# Patient Record
Sex: Female | Born: 1960
Health system: Southern US, Community
[De-identification: ages and names within clinical notes are randomized; demographics above are authoritative.]

## PROBLEM LIST (undated history)

## (undated) DIAGNOSIS — I10 Essential (primary) hypertension: Secondary | ICD-10-CM

## (undated) DIAGNOSIS — D509 Iron deficiency anemia, unspecified: Secondary | ICD-10-CM

## (undated) DIAGNOSIS — F411 Generalized anxiety disorder: Secondary | ICD-10-CM

## (undated) DIAGNOSIS — D72829 Elevated white blood cell count, unspecified: Secondary | ICD-10-CM

## (undated) DIAGNOSIS — I82409 Acute embolism and thrombosis of unspecified deep veins of unspecified lower extremity: Secondary | ICD-10-CM

## (undated) DIAGNOSIS — D693 Immune thrombocytopenic purpura: Secondary | ICD-10-CM

## (undated) DIAGNOSIS — I2699 Other pulmonary embolism without acute cor pulmonale: Secondary | ICD-10-CM

## (undated) HISTORY — DX: Elevated white blood cell count, unspecified: D72.829

## (undated) HISTORY — DX: Generalized anxiety disorder: F41.1

## (undated) HISTORY — PX: CHOLECYSTECTOMY: SHX55

## (undated) HISTORY — DX: Iron deficiency anemia, unspecified: D50.9

## (undated) HISTORY — DX: Acute embolism and thrombosis of unspecified deep veins of unspecified lower extremity: I82.409

## (undated) HISTORY — DX: Essential (primary) hypertension: I10

## (undated) HISTORY — PX: OTHER SURGICAL HISTORY: SHX169

## (undated) HISTORY — DX: Immune thrombocytopenic purpura: D69.3

---

## 2001-05-24 ENCOUNTER — Encounter: Payer: Self-pay | Admitting: Hematology & Oncology

## 2001-05-24 ENCOUNTER — Ambulatory Visit (HOSPITAL_COMMUNITY): Admission: RE | Admit: 2001-05-24 | Discharge: 2001-05-24 | Payer: Self-pay | Admitting: Hematology & Oncology

## 2001-05-28 ENCOUNTER — Ambulatory Visit (HOSPITAL_COMMUNITY): Admission: RE | Admit: 2001-05-28 | Discharge: 2001-05-28 | Payer: Self-pay | Admitting: Hematology & Oncology

## 2001-05-28 ENCOUNTER — Encounter: Payer: Self-pay | Admitting: Hematology & Oncology

## 2007-09-27 ENCOUNTER — Encounter: Payer: Self-pay | Admitting: Internal Medicine

## 2007-09-28 ENCOUNTER — Encounter: Payer: Self-pay | Admitting: Internal Medicine

## 2007-09-28 LAB — CONVERTED CEMR LAB
Albumin: 4.2 g/dL
Alkaline Phosphatase: 79 units/L
BUN: 10 mg/dL
CO2: 26 meq/L
Chloride: 101 meq/L
Cholesterol: 178 mg/dL
Creatinine, Ser: 0.8 mg/dL
HCT: 38.2 %
HDL: 49 mg/dL
MCV: 95.4 fL
Platelets: 135 10*3/uL
RDW: 12.6 %
Sodium: 139 meq/L
Total Protein: 7.2 g/dL
Triglyceride fasting, serum: 150 mg/dL

## 2007-11-12 ENCOUNTER — Encounter: Payer: Self-pay | Admitting: Internal Medicine

## 2007-11-12 LAB — CONVERTED CEMR LAB
MCV: 93.2 fL
Neutrophils Relative %: 61.9 %
Platelets: 149 10*3/uL
RBC: 4.16 M/uL
RDW: 12.4 %

## 2010-05-10 ENCOUNTER — Ambulatory Visit: Payer: Self-pay | Admitting: Internal Medicine

## 2010-05-10 DIAGNOSIS — I10 Essential (primary) hypertension: Secondary | ICD-10-CM | POA: Insufficient documentation

## 2010-05-10 DIAGNOSIS — F411 Generalized anxiety disorder: Secondary | ICD-10-CM | POA: Insufficient documentation

## 2010-05-10 DIAGNOSIS — D693 Immune thrombocytopenic purpura: Secondary | ICD-10-CM | POA: Insufficient documentation

## 2010-05-10 LAB — CONVERTED CEMR LAB
BUN: 9 mg/dL (ref 6–23)
Basophils Relative: 0.6 % (ref 0.0–3.0)
CO2: 26 meq/L (ref 19–32)
Chloride: 105 meq/L (ref 96–112)
Eosinophils Absolute: 0.4 10*3/uL (ref 0.0–0.7)
Eosinophils Relative: 3 % (ref 0.0–5.0)
Glucose, Bld: 105 mg/dL — ABNORMAL HIGH (ref 70–99)
Hemoglobin: 13.2 g/dL (ref 12.0–15.0)
Lymphocytes Relative: 18.9 % (ref 12.0–46.0)
MCHC: 32.7 g/dL (ref 30.0–36.0)
Monocytes Relative: 6.7 % (ref 3.0–12.0)
Neutro Abs: 9.4 10*3/uL — ABNORMAL HIGH (ref 1.4–7.7)
Potassium: 3.7 meq/L (ref 3.5–5.1)
RBC: 4.25 M/uL (ref 3.87–5.11)

## 2010-05-21 ENCOUNTER — Encounter: Payer: Self-pay | Admitting: Internal Medicine

## 2010-05-30 ENCOUNTER — Ambulatory Visit: Payer: Self-pay | Admitting: Internal Medicine

## 2010-06-17 ENCOUNTER — Inpatient Hospital Stay (HOSPITAL_COMMUNITY): Admission: AD | Admit: 2010-06-17 | Discharge: 2010-06-18 | Payer: Self-pay | Admitting: Internal Medicine

## 2010-06-17 ENCOUNTER — Encounter (INDEPENDENT_AMBULATORY_CARE_PROVIDER_SITE_OTHER): Payer: Self-pay | Admitting: *Deleted

## 2010-06-17 ENCOUNTER — Ambulatory Visit: Payer: Self-pay | Admitting: Internal Medicine

## 2010-06-17 ENCOUNTER — Telehealth: Payer: Self-pay | Admitting: Internal Medicine

## 2010-06-17 DIAGNOSIS — R5381 Other malaise: Secondary | ICD-10-CM | POA: Insufficient documentation

## 2010-06-17 DIAGNOSIS — R5383 Other fatigue: Secondary | ICD-10-CM

## 2010-06-17 DIAGNOSIS — D72829 Elevated white blood cell count, unspecified: Secondary | ICD-10-CM | POA: Insufficient documentation

## 2010-06-17 LAB — CONVERTED CEMR LAB
ALT: 18 units/L
AST: 41 units/L
Albumin: 3.5 g/dL
Alkaline Phosphatase: 71 units/L
BUN: 6 mg/dL
Basophils Absolute: 0.1 10*3/uL (ref 0.0–0.1)
CO2: 25 meq/L
Calcium: 8.9 mg/dL
Chloride: 103 meq/L
Creatinine, Ser: 0.89 mg/dL
Eosinophils Absolute: 0.4 10*3/uL (ref 0.0–0.7)
Glucose, Bld: 125 mg/dL
Hemoglobin: 5.7 g/dL — CL (ref 12.0–15.0)
Lymphocytes Relative: 14.8 % (ref 12.0–46.0)
Monocytes Relative: 5.9 % (ref 3.0–12.0)
Neutro Abs: 15.7 10*3/uL — ABNORMAL HIGH (ref 1.4–7.7)
Neutrophils Relative %: 76.8 % (ref 43.0–77.0)
Potassium: 3.7 meq/L
RDW: 15.1 % — ABNORMAL HIGH (ref 11.5–14.6)
Sodium: 137 meq/L
TSH: 1.077 microintl units/mL
Total Bilirubin: 0.5 mg/dL
Total Protein: 6.6 g/dL

## 2010-06-18 LAB — CONVERTED CEMR LAB
HCT: 23.2 %
Hemoglobin: 7.9 g/dL
RBC: 2.57 M/uL
RDW: 16 %
WBC: 19.2 10*3/uL

## 2010-06-21 ENCOUNTER — Ambulatory Visit: Payer: Self-pay | Admitting: Hematology & Oncology

## 2010-06-26 ENCOUNTER — Ambulatory Visit: Payer: Self-pay | Admitting: Internal Medicine

## 2010-06-26 DIAGNOSIS — D509 Iron deficiency anemia, unspecified: Secondary | ICD-10-CM | POA: Insufficient documentation

## 2010-06-26 DIAGNOSIS — N920 Excessive and frequent menstruation with regular cycle: Secondary | ICD-10-CM | POA: Insufficient documentation

## 2010-06-26 LAB — CONVERTED CEMR LAB
Basophils Absolute: 0.2 10*3/uL — ABNORMAL HIGH (ref 0.0–0.1)
Eosinophils Relative: 4 % (ref 0–5)
HCT: 29.3 % — ABNORMAL LOW (ref 36.0–46.0)
Hemoglobin: 9.7 g/dL — ABNORMAL LOW (ref 12.0–15.0)
Lymphocytes Relative: 14 % (ref 12–46)
Lymphs Abs: 3.1 10*3/uL (ref 0.7–4.0)
Monocytes Absolute: 1.6 10*3/uL — ABNORMAL HIGH (ref 0.1–1.0)
Neutro Abs: 15.4 10*3/uL — ABNORMAL HIGH (ref 1.7–7.7)
RBC: 3.22 M/uL — ABNORMAL LOW (ref 3.87–5.11)
RDW: 17.9 % — ABNORMAL HIGH (ref 11.5–15.5)
WBC: 21.2 10*3/uL — ABNORMAL HIGH (ref 4.0–10.5)

## 2010-06-28 ENCOUNTER — Ambulatory Visit: Payer: Self-pay | Admitting: Internal Medicine

## 2010-06-28 LAB — MANUAL DIFFERENTIAL (CHCC SATELLITE)
ANC (CHCC HP manual diff): 11.5 10*3/uL — ABNORMAL HIGH (ref 1.5–6.7)
LYMPH: 22 % (ref 14–48)
PLT EST ~~LOC~~: DECREASED
Platelet Morphology: NORMAL
SEG: 71 % (ref 40–75)

## 2010-06-28 LAB — CBC WITH DIFFERENTIAL (CANCER CENTER ONLY)
HGB: 10 g/dL — ABNORMAL LOW (ref 11.6–15.9)
MCH: 30.4 pg (ref 26.0–34.0)
RBC: 3.27 10*6/uL — ABNORMAL LOW (ref 3.70–5.32)

## 2010-06-28 LAB — CHCC SATELLITE - SMEAR

## 2010-07-02 ENCOUNTER — Ambulatory Visit: Payer: Self-pay | Admitting: Internal Medicine

## 2010-07-02 LAB — PROTEIN ELECTROPHORESIS, SERUM
Albumin ELP: 56.3 % (ref 55.8–66.1)
Alpha-1-Globulin: 8.7 % — ABNORMAL HIGH (ref 2.9–4.9)

## 2010-07-02 LAB — ERYTHROPOIETIN: Erythropoietin: 35.4 m[IU]/mL — ABNORMAL HIGH (ref 2.6–34.0)

## 2010-07-02 LAB — IRON AND TIBC

## 2010-07-02 LAB — HAPTOGLOBIN: Haptoglobin: 38 mg/dL (ref 16–200)

## 2010-07-02 LAB — RETICULOCYTES (CHCC): Retic Ct Pct: 6.8 % — ABNORMAL HIGH (ref 0.4–3.1)

## 2010-07-02 LAB — DIRECT ANTIGLOBULIN TEST (NOT AT ARMC): DAT IgG: NEGATIVE

## 2010-07-03 ENCOUNTER — Ambulatory Visit: Payer: Self-pay | Admitting: Hematology & Oncology

## 2010-07-05 LAB — CONVERTED CEMR LAB
Basophils Relative: 0.2 %
Eosinophils Relative: 3.1 %
HCT: 36.8 %
Hemoglobin: 11.8 g/dL
Lymphocytes, automated: 23.3 %
Monocytes Relative: 5.5 %
WBC: 21.1 10*3/uL

## 2010-07-05 LAB — TECHNOLOGIST REVIEW

## 2010-07-05 LAB — CBC WITH DIFFERENTIAL/PLATELET
BASO%: 0.2 % (ref 0.0–2.0)
EOS%: 3.1 % (ref 0.0–7.0)
LYMPH%: 23.3 % (ref 14.0–49.7)
MCH: 31.1 pg (ref 25.1–34.0)
MCHC: 32.1 g/dL (ref 31.5–36.0)
MONO#: 1.2 10*3/uL — ABNORMAL HIGH (ref 0.1–0.9)
Platelets: 14 10*3/uL — ABNORMAL LOW (ref 145–400)
RBC: 3.8 10*6/uL (ref 3.70–5.45)
WBC: 21.1 10*3/uL — ABNORMAL HIGH (ref 3.9–10.3)

## 2010-07-09 ENCOUNTER — Ambulatory Visit (HOSPITAL_COMMUNITY): Admission: RE | Admit: 2010-07-09 | Discharge: 2010-07-09 | Payer: Self-pay | Admitting: Hematology & Oncology

## 2010-07-12 LAB — CBC WITH DIFFERENTIAL/PLATELET
Basophils Absolute: 0.1 10*3/uL (ref 0.0–0.1)
Eosinophils Absolute: 0.1 10*3/uL (ref 0.0–0.5)
HGB: 12.1 g/dL (ref 11.6–15.9)
MCV: 97.4 fL (ref 79.5–101.0)
MONO%: 9.7 % (ref 0.0–14.0)
NEUT#: 19.5 10*3/uL — ABNORMAL HIGH (ref 1.5–6.5)
RDW: 18.9 % — ABNORMAL HIGH (ref 11.2–14.5)

## 2010-07-12 LAB — CONVERTED CEMR LAB
Eosinophils Relative: 0.3 %
HCT: 37.5 %
Hemoglobin: 12.1 g/dL
Monocytes Relative: 9.7 %
RBC: 3.85 M/uL

## 2010-07-19 ENCOUNTER — Ambulatory Visit (HOSPITAL_BASED_OUTPATIENT_CLINIC_OR_DEPARTMENT_OTHER): Admission: RE | Admit: 2010-07-19 | Discharge: 2010-07-19 | Payer: Self-pay | Admitting: Hematology & Oncology

## 2010-07-19 ENCOUNTER — Encounter: Payer: Self-pay | Admitting: Internal Medicine

## 2010-07-19 ENCOUNTER — Ambulatory Visit: Payer: Self-pay | Admitting: Diagnostic Radiology

## 2010-07-19 LAB — CBC WITH DIFFERENTIAL/PLATELET
Basophils Absolute: 0.1 10*3/uL (ref 0.0–0.1)
Eosinophils Absolute: 0 10*3/uL (ref 0.0–0.5)
HCT: 38.9 % (ref 34.8–46.6)
LYMPH%: 16.5 % (ref 14.0–49.7)
MCHC: 32.4 g/dL (ref 31.5–36.0)
MONO#: 1.6 10*3/uL — ABNORMAL HIGH (ref 0.1–0.9)
NEUT#: 13.2 10*3/uL — ABNORMAL HIGH (ref 1.5–6.5)
NEUT%: 74.1 % (ref 38.4–76.8)
Platelets: 277 10*3/uL (ref 145–400)
WBC: 17.8 10*3/uL — ABNORMAL HIGH (ref 3.9–10.3)

## 2010-07-19 LAB — CONVERTED CEMR LAB
Basophils Relative: 0.6 %
Eosinophils Relative: 0.1 %
HCT: 38.9 %
Neutrophils Relative %: 74.1 %
Platelets: 277 10*3/uL
RBC: 3.98 M/uL
RDW: 17.6 %

## 2010-07-22 ENCOUNTER — Telehealth: Payer: Self-pay | Admitting: Internal Medicine

## 2010-07-24 ENCOUNTER — Ambulatory Visit: Payer: Self-pay | Admitting: Internal Medicine

## 2010-07-24 ENCOUNTER — Ambulatory Visit: Payer: Self-pay | Admitting: Hematology & Oncology

## 2010-07-24 LAB — HYPERCOAGULABLE PANEL, COMPREHENSIVE
Anticardiolipin IgA: 3 APL U/mL (ref <22)
Anticardiolipin IgG: 3 GPL U/mL (ref <23)
Anticardiolipin IgM: 1 MPL U/mL (ref <11)
Beta-2 Glyco I IgG: 0 G Units (ref <20)
Beta-2-Glycoprotein I IgM: 3 M Units (ref <20)
PTT Lupus Anticoagulant: 40.8 secs (ref 30.0–45.6)

## 2010-07-26 ENCOUNTER — Encounter: Payer: Self-pay | Admitting: Internal Medicine

## 2010-07-26 LAB — CBC WITH DIFFERENTIAL (CANCER CENTER ONLY)
BASO#: 0.1 10*3/uL (ref 0.0–0.2)
BASO%: 1 % (ref 0.0–2.0)
EOS%: 3.8 % (ref 0.0–7.0)
Eosinophils Absolute: 0.4 10*3/uL (ref 0.0–0.5)
HCT: 40.4 % (ref 34.8–46.6)
HGB: 13.1 g/dL (ref 11.6–15.9)
LYMPH#: 3.2 10*3/uL (ref 0.9–3.3)
LYMPH%: 33.6 % (ref 14.0–48.0)
MCH: 31.8 pg (ref 26.0–34.0)
MCHC: 32.4 g/dL (ref 32.0–36.0)
MCV: 98 fL (ref 81–101)
MONO#: 0.9 10*3/uL (ref 0.1–0.9)
MONO%: 9 % (ref 0.0–13.0)
NEUT#: 5 10*3/uL (ref 1.5–6.5)
NEUT%: 52.6 % (ref 39.6–80.0)
Platelets: 302 10*3/uL (ref 145–400)
RBC: 4.11 10*6/uL (ref 3.70–5.32)
RDW: 14.8 % — ABNORMAL HIGH (ref 10.5–14.6)
WBC: 9.6 10*3/uL (ref 3.9–10.0)

## 2010-07-26 LAB — PROTIME-INR (CHCC SATELLITE)
INR: 2.4 (ref 2.0–3.5)
Protime: 28.8 Seconds — ABNORMAL HIGH (ref 10.6–13.4)

## 2010-07-26 LAB — FERRITIN: Ferritin: 364 ng/mL — ABNORMAL HIGH (ref 10–291)

## 2010-07-26 LAB — TECHNOLOGIST REVIEW CHCC SATELLITE

## 2010-08-02 ENCOUNTER — Ambulatory Visit: Payer: Self-pay | Admitting: Hematology & Oncology

## 2010-08-02 LAB — CBC WITH DIFFERENTIAL/PLATELET
Basophils Absolute: 0.1 10*3/uL (ref 0.0–0.1)
EOS%: 5.9 % (ref 0.0–7.0)
HCT: 38.9 % (ref 34.8–46.6)
HGB: 12.9 g/dL (ref 11.6–15.9)
MCH: 31.9 pg (ref 25.1–34.0)
NEUT%: 47.9 % (ref 38.4–76.8)
Platelets: 353 10*3/uL (ref 145–400)
lymph#: 3.8 10*3/uL — ABNORMAL HIGH (ref 0.9–3.3)

## 2010-08-02 LAB — PROTIME-INR

## 2010-08-05 ENCOUNTER — Ambulatory Visit: Payer: Self-pay | Admitting: Internal Medicine

## 2010-08-09 ENCOUNTER — Ambulatory Visit: Payer: Self-pay | Admitting: Hematology & Oncology

## 2010-08-09 ENCOUNTER — Inpatient Hospital Stay (HOSPITAL_COMMUNITY): Admission: AD | Admit: 2010-08-09 | Discharge: 2010-08-11 | Payer: Self-pay | Admitting: Hematology & Oncology

## 2010-08-09 LAB — CBC WITH DIFFERENTIAL/PLATELET
BASO%: 0.9 % (ref 0.0–2.0)
HCT: 37.4 % (ref 34.8–46.6)
HGB: 12.7 g/dL (ref 11.6–15.9)
LYMPH%: 24.5 % (ref 14.0–49.7)
MCH: 31.8 pg (ref 25.1–34.0)
MCV: 93.7 fL (ref 79.5–101.0)
RDW: 14.9 % — ABNORMAL HIGH (ref 11.2–14.5)
nRBC: 1 % — ABNORMAL HIGH (ref 0–0)

## 2010-08-09 LAB — PROTIME-INR
INR: 2.5 (ref 2.00–3.50)
Protime: 30 Seconds — ABNORMAL HIGH (ref 10.6–13.4)

## 2010-08-09 LAB — TECHNOLOGIST REVIEW

## 2010-08-10 ENCOUNTER — Ambulatory Visit: Payer: Self-pay | Admitting: Internal Medicine

## 2010-08-14 LAB — CBC WITH DIFFERENTIAL/PLATELET
BASO%: 0.2 % (ref 0.0–2.0)
Basophils Absolute: 0.1 10*3/uL (ref 0.0–0.1)
EOS%: 0 % (ref 0.0–7.0)
HCT: 33 % — ABNORMAL LOW (ref 34.8–46.6)
HGB: 11.4 g/dL — ABNORMAL LOW (ref 11.6–15.9)
LYMPH%: 9 % — ABNORMAL LOW (ref 14.0–49.7)
MCH: 32.1 pg (ref 25.1–34.0)
MCHC: 34.5 g/dL (ref 31.5–36.0)
MCV: 93 fL (ref 79.5–101.0)
NEUT%: 83.4 % — ABNORMAL HIGH (ref 38.4–76.8)
Platelets: 327 10*3/uL (ref 145–400)
lymph#: 2.4 10*3/uL (ref 0.9–3.3)

## 2010-08-14 LAB — PROTIME-INR

## 2010-08-19 ENCOUNTER — Encounter: Payer: Self-pay | Admitting: Internal Medicine

## 2010-08-19 LAB — CBC WITH DIFFERENTIAL (CANCER CENTER ONLY)
BASO%: 1.1 % (ref 0.0–2.0)
Eosinophils Absolute: 0.3 10*3/uL (ref 0.0–0.5)
HCT: 41.3 % (ref 34.8–46.6)
LYMPH%: 10.3 % — ABNORMAL LOW (ref 14.0–48.0)
MCH: 32 pg (ref 26.0–34.0)
MCV: 97 fL (ref 81–101)
MONO#: 1.5 10*3/uL — ABNORMAL HIGH (ref 0.1–0.9)
MONO%: 6.7 % (ref 0.0–13.0)
NEUT%: 80.5 % — ABNORMAL HIGH (ref 39.6–80.0)
Platelets: 676 10*3/uL — ABNORMAL HIGH (ref 145–400)
RDW: 13.2 % (ref 10.5–14.6)
WBC: 23.1 10*3/uL — ABNORMAL HIGH (ref 3.9–10.0)

## 2010-08-19 LAB — TECHNOLOGIST REVIEW CHCC SATELLITE

## 2010-08-19 LAB — PROTIME-INR (CHCC SATELLITE)

## 2010-08-23 ENCOUNTER — Ambulatory Visit: Payer: Self-pay | Admitting: Hematology & Oncology

## 2010-08-28 ENCOUNTER — Encounter: Payer: Self-pay | Admitting: Internal Medicine

## 2010-08-28 LAB — TECHNOLOGIST REVIEW CHCC SATELLITE: Tech Review: INCREASED

## 2010-08-28 LAB — CBC WITH DIFFERENTIAL (CANCER CENTER ONLY)
BASO%: 0.8 % (ref 0.0–2.0)
EOS%: 1.5 % (ref 0.0–7.0)
Eosinophils Absolute: 0.2 10*3/uL (ref 0.0–0.5)
MCH: 32.4 pg (ref 26.0–34.0)
MONO%: 5.5 % (ref 0.0–13.0)
NEUT#: 13.1 10*3/uL — ABNORMAL HIGH (ref 1.5–6.5)
Platelets: 473 10*3/uL — ABNORMAL HIGH (ref 145–400)
RBC: 3.76 10*6/uL (ref 3.70–5.32)
RDW: 12.9 % (ref 10.5–14.6)

## 2010-08-28 LAB — PROTIME-INR (CHCC SATELLITE)
INR: 1.7 — ABNORMAL LOW (ref 2.0–3.5)
Protime: 20.4 s — ABNORMAL HIGH (ref 10.6–13.4)

## 2010-09-02 LAB — CBC WITH DIFFERENTIAL/PLATELET
BASO%: 0.1 % (ref 0.0–2.0)
Basophils Absolute: 0 10*3/uL (ref 0.0–0.1)
EOS%: 2.7 % (ref 0.0–7.0)
Eosinophils Absolute: 0.4 10*3/uL (ref 0.0–0.5)
HCT: 37.2 % (ref 34.8–46.6)
HGB: 12.6 g/dL (ref 11.6–15.9)
LYMPH%: 33.9 % (ref 14.0–49.7)
MCH: 32.1 pg (ref 25.1–34.0)
MCHC: 33.9 g/dL (ref 31.5–36.0)
MCV: 94.7 fL (ref 79.5–101.0)
MONO#: 0.7 10*3/uL (ref 0.1–0.9)
MONO%: 4.5 % (ref 0.0–14.0)
NEUT#: 9.4 10*3/uL — ABNORMAL HIGH (ref 1.5–6.5)
NEUT%: 58.8 % (ref 38.4–76.8)
Platelets: 293 10*3/uL (ref 145–400)
RBC: 3.93 10*6/uL (ref 3.70–5.45)
RDW: 16 % — ABNORMAL HIGH (ref 11.2–14.5)
WBC: 15.9 10*3/uL — ABNORMAL HIGH (ref 3.9–10.3)
lymph#: 5.4 10*3/uL — ABNORMAL HIGH (ref 0.9–3.3)
nRBC: 0 % (ref 0–0)

## 2010-09-04 ENCOUNTER — Ambulatory Visit: Payer: Self-pay | Admitting: Hematology & Oncology

## 2010-09-06 LAB — CBC WITH DIFFERENTIAL/PLATELET
Eosinophils Absolute: 0.4 10*3/uL (ref 0.0–0.5)
LYMPH%: 28.6 % (ref 14.0–49.7)
MONO#: 1.4 10*3/uL — ABNORMAL HIGH (ref 0.1–0.9)
NEUT#: 9.6 10*3/uL — ABNORMAL HIGH (ref 1.5–6.5)
Platelets: 312 10*3/uL (ref 145–400)
RBC: 3.88 10*6/uL (ref 3.70–5.45)
WBC: 15.8 10*3/uL — ABNORMAL HIGH (ref 3.9–10.3)
nRBC: 0 % (ref 0–0)

## 2010-09-06 LAB — PROTIME-INR: Protime: 18 Seconds — ABNORMAL HIGH (ref 10.6–13.4)

## 2010-09-16 LAB — CBC WITH DIFFERENTIAL/PLATELET
Basophils Absolute: 0 10*3/uL (ref 0.0–0.1)
Eosinophils Absolute: 0 10*3/uL (ref 0.0–0.5)
HCT: 36.9 % (ref 34.8–46.6)
HGB: 12.5 g/dL (ref 11.6–15.9)
MONO#: 0.2 10*3/uL (ref 0.1–0.9)
NEUT%: 94.5 % — ABNORMAL HIGH (ref 38.4–76.8)
WBC: 18.7 10*3/uL — ABNORMAL HIGH (ref 3.9–10.3)
lymph#: 0.8 10*3/uL — ABNORMAL LOW (ref 0.9–3.3)

## 2010-09-16 LAB — PROTIME-INR: INR: 4.1 — ABNORMAL HIGH (ref 2.00–3.50)

## 2010-09-27 ENCOUNTER — Ambulatory Visit: Payer: Self-pay | Admitting: Hematology & Oncology

## 2010-09-30 ENCOUNTER — Encounter: Payer: Self-pay | Admitting: Internal Medicine

## 2010-09-30 LAB — CBC WITH DIFFERENTIAL (CANCER CENTER ONLY)
BASO#: 0.2 10*3/uL (ref 0.0–0.2)
EOS%: 1.5 % (ref 0.0–7.0)
HCT: 37.9 % (ref 34.8–46.6)
HGB: 12.6 g/dL (ref 11.6–15.9)
MCH: 32.1 pg (ref 26.0–34.0)
MCHC: 33.3 g/dL (ref 32.0–36.0)
MCV: 97 fL (ref 81–101)
MONO%: 7.4 % (ref 0.0–13.0)
NEUT%: 73.7 % (ref 39.6–80.0)

## 2010-09-30 LAB — COMPREHENSIVE METABOLIC PANEL
ALT: 15 U/L (ref 0–35)
Albumin: 4.1 g/dL (ref 3.5–5.2)
Alkaline Phosphatase: 51 U/L (ref 39–117)
CO2: 24 mEq/L (ref 19–32)
Glucose, Bld: 143 mg/dL — ABNORMAL HIGH (ref 70–99)
Potassium: 3.5 mEq/L (ref 3.5–5.3)
Sodium: 139 mEq/L (ref 135–145)
Total Protein: 6.5 g/dL (ref 6.0–8.3)

## 2010-09-30 LAB — PROTIME-INR (CHCC SATELLITE): INR: 0.9 — ABNORMAL LOW (ref 2.0–3.5)

## 2010-10-07 ENCOUNTER — Ambulatory Visit: Payer: Self-pay | Admitting: Internal Medicine

## 2010-10-09 ENCOUNTER — Encounter (INDEPENDENT_AMBULATORY_CARE_PROVIDER_SITE_OTHER): Payer: Self-pay | Admitting: *Deleted

## 2010-10-24 ENCOUNTER — Ambulatory Visit: Payer: Self-pay | Admitting: Hematology & Oncology

## 2010-10-28 LAB — CBC WITH DIFFERENTIAL/PLATELET
BASO%: 0.5 % (ref 0.0–2.0)
Basophils Absolute: 0.1 10*3/uL (ref 0.0–0.1)
EOS%: 6.3 % (ref 0.0–7.0)
Eosinophils Absolute: 0.9 10*3/uL — ABNORMAL HIGH (ref 0.0–0.5)
HCT: 36.5 % (ref 34.8–46.6)
HGB: 12.7 g/dL (ref 11.6–15.9)
LYMPH%: 24.5 % (ref 14.0–49.7)
MCH: 32 pg (ref 25.1–34.0)
MCHC: 34.8 g/dL (ref 31.5–36.0)
MCV: 91.9 fL (ref 79.5–101.0)
MONO#: 1 10*3/uL — ABNORMAL HIGH (ref 0.1–0.9)
MONO%: 6.7 % (ref 0.0–14.0)
NEUT#: 8.9 10*3/uL — ABNORMAL HIGH (ref 1.5–6.5)
NEUT%: 62 % (ref 38.4–76.8)
Platelets: 215 10*3/uL (ref 145–400)
RBC: 3.97 10*6/uL (ref 3.70–5.45)
RDW: 14.1 % (ref 11.2–14.5)
WBC: 14.3 10*3/uL — ABNORMAL HIGH (ref 3.9–10.3)
lymph#: 3.5 10*3/uL — ABNORMAL HIGH (ref 0.9–3.3)
nRBC: 0 % (ref 0–0)

## 2010-10-28 LAB — CHCC SMEAR

## 2010-11-07 ENCOUNTER — Ambulatory Visit: Admit: 2010-11-07 | Payer: Self-pay | Admitting: Internal Medicine

## 2010-11-19 NOTE — Letter (Signed)
Summary: Out of Work  [facility]  [address] [place], [place] [address]   Phone: [phone]  Fax: [phone]    June 17, 2010   Employee:  Janice Martin    To Whom It May Concern:   For Medical reasons, please excuse the above named employee from work for the following dates:  Start: 06/17/10    End: 06/18/10, May return back to work on Wednesday 06/19/10    If you need additional information, please feel free to contact our office.         Sincerely,    Dr. Rene Paci

## 2010-11-19 NOTE — Letter (Signed)
Summary: [facility]  [facility]   Imported By: Sherian Rein 09/09/2010 14:03:05  _____________________________________________________________________  External Attachment:    Type:   Image     Comment:   External Document

## 2010-11-19 NOTE — Letter (Signed)
Summary: [facility]  [facility]   Imported By: Sherian Rein 08/16/2010 09:22:19  _____________________________________________________________________  External Attachment:    Type:   Image     Comment:   External Document

## 2010-11-19 NOTE — Letter (Signed)
Summary: [facility]  [facility]   Imported By: Sherian Rein 08/09/2010 10:55:21  _____________________________________________________________________  External Attachment:    Type:   Image     Comment:   External Document

## 2010-11-19 NOTE — Letter (Signed)
Summary: Internal Correspondence  Internal Correspondence   Imported By: Sherian Rein 09/09/2010 12:36:32  _____________________________________________________________________  External Attachment:    Type:   Image     Comment:   External Document

## 2010-11-19 NOTE — Assessment & Plan Note (Signed)
Summary: 1-2 WK ROV /NWS  #   Vital Signs:  Patient profile:   50 year old female Height:      64 inches (162.56 cm) Weight:      193.2 pounds (87.82 kg) O2 Sat:      98 % on Room air Temp:     98.5 degrees F (36.94 degrees C) oral Pulse rate:   95 / minute BP sitting:   162 / 92  (left arm) Cuff size:   large  Vitals Entered By: Orlan Leavens RMA (June 26, 2010 8:48 AM)  O2 Flow:  Room air CC: 1-2 week follow-up Is Patient Diabetic? No Pain Assessment Patient in pain? no      Comments Pt d/c from hosp 06/18/10   Primary Care Provider:  Newt Lukes MD  CC:  1-2 week follow-up.  History of Present Illness: here for hosp f/u - acute iron defic anemia feels "200%" better since transfusion 2U PRBCs last week - no fatigue, no pain, no cramping, not weak - no HA or fever - no SOB or cough - no D/C no bleeding since last heavy menses - clotting and menorrhgia reviewed - plans to see gyn next week taking iron two times a day   reviewed other chronic issues anxiety - onset years ago (when lost job/house 2004) but progressively worse, esp last few months - denies recent stressors or change in life events - +hx same 20years ago  - use trial xanax for short period of time then - denies depression signs - no sadness or sleep problems - described as feeling "closed in" at work and "jumpy inside" - assoc with occ palpitations and tremor in hands -  no change in BMs, + weight gain (10lbs in past year) trial ciltalopram rx'd 04/2010 - not taking due to fear of suicide risk on warning labels also not needing BZ - "i'm ok"  HTN - hx same, prev on lisinopril with reported good control- no known kidney dz or cardiac problems - +mild swelling in feet chronic - no SOB or HA begun on combo HCTZ-ACEI mid 04/2010- ?cramping as SE so now off med combo- tol bystolic w/o adv effects - reports BP well controlled at home  ITP hx - no bleeding or bruising - no changes prev followed  with heme for same, last seen 2003  -  Date:  06/18/2010    WBC: 19.2    HGB: 7.9    HCT: 23.2    RBC: 2.57    PLT: 50    MCV: 90.2    RDW: 16.0  Date:  06/17/2010    BG Random: 125    BUN: 6    Creatinine: 0.89    Sodium: 137    Potassium: 3.7    Chloride: 103    CO2 Total: 25    SGOT (AST): 41    SGPT (ALT): 18    T. Bilirubin: 0.5    Alk Phos: 71    Calcium: 8.9    Total Protein: 6.6    Albumin: 3.5    TSH: 1.077  Current Medications (verified): 1)  Bystolic 5 Mg Tabs (Nebivolol Hcl) .Marland Kitchen.. 1 By Mouth Once Daily 2)  Alprazolam 0.25 Mg Tabs (Alprazolam) .Marland Kitchen.. 1 By Mouth Every 4 Hours As Needed For Anxiety Symptoms 3)  Iron 325 (65 Fe) Mg Tabs (Ferrous Sulfate) .... Take 1 Two Times A Day  Allergies (verified): 1)  ! Morphine  Past History:  Past Medical History:  Hypertension ITP - mild leukocytosis dating back 09/2007 cholelithiasis anxiety anemia - iron defic menorrhagia s/p 2U PRBC txfn 04/2010  MD roster: heme - ennever gyn- richard kaplan   Review of Systems  The patient denies fever, weight loss, chest pain, headaches, abdominal pain, melena, and hematochezia.    Physical Exam  General:  nontoxic. mildly overweight-appearing.  alert, well-developed, well-nourished, and cooperative to examination.    Lungs:  normal respiratory effort, no intercostal retractions or use of accessory muscles; normal breath sounds bilaterally - no crackles and no wheezes.    Heart:  normal rate, slightly tachy rhythm, no murmur, and no rub. BLE with no edema.  Neurologic:  alert & oriented X3 and cranial nerves II-XII symetrically intact.  strength normal in all extremities, sensation intact to light touch, and gait normal. speech fluent without dysarthria or aphasia; follows commands with good comprehension.  Skin:  mildly flushed face and chest  - otherwise, no rashes, vesicles, ulcers, or erythema. No nodules or irregularity to palpation.  Psych:  Oriented X3, memory  intact for recent and remote, normally interactive, good eye contact, mild anxious appearing, not depressed appearing, and not agitated.      Impression & Recommendations:  Problem # 1:  ANEMIA-IRON DEFICIENCY (ICD-280.9)  recent hosp for PRBC transfusion reviewed presumably due to menorrhgia - appt [facility] gyn this week to review - ?endometrial bx also concern for primary BM issue with chronic ITP and persistining leukocytosis - to see heme this week recheck CBC now to monitor - cont iron pills Her updated medication list for this problem includes:    Iron 325 (65 Fe) Mg Tabs (Ferrous sulfate) .Marland Kitchen... Take 1 two times a day  Hgb: 7.9 (06/18/2010)   Hct: 23.2 (06/18/2010)   Platelets: 50 (06/18/2010) RBC: 2.57 (06/18/2010)   RDW: 16.0 (06/18/2010)   WBC: 19.2 (06/18/2010) MCV: 90.2 (06/18/2010)   MCHC: 32.7 (06/17/2010) TSH: 1.077 (06/17/2010)  Problem # 2:  IMMUNE THROMBOCYTOPENIC PURPURA (ICD-287.31)  Orders: TLB-CBC Platelet - w/Differential (85025-CBCD) Oncology Referral (Oncology)  hx same 1987 - reports no sig issues since splenectomy '87 - check lab now - see baove - to see hemer this week (ennever)  Problem # 3:  LEUKOCYTOSIS UNSPECIFIED (ICD-288.60)  long hx ITP - mild persisitng leukocytosis noted -  recheck now, esp with fatigue and refer to heme for re-eval of same - ?BM bx needed see above issues  Problem # 4:  MENORRHAGIA (ICD-626.2) see #1 - anemia  Problem # 5:  HYPERTENSION (ICD-401.9)  Her updated medication list for this problem includes:    Bystolic 5 Mg Tabs (Nebivolol hcl) .Marland Kitchen... 1 by mouth once daily  not yet at goal  intol of lisinopril - intol of hctz component with dehydration and cramping symptoms - stop same cont bystolic trial - pt reports BP better (except today) will reasses at f/u in 4 weeks - will need FLP at future OV when fasting   Prior BP: 148/102 (05/30/2010) Prior BP: 152/112 (05/10/2010) Prior BP: 152/98 (06/17/2010)  Labs  Reviewed: K+: 3.7 (06/17/2010) Creat: : 0.89 (06/17/2010)   Chol: 178 (09/28/2007)   HDL: 49 (09/28/2007)   LDL: 117 (09/28/2007)   TG: 150 (09/28/2007)  Complete Medication List: 1)  Bystolic 5 Mg Tabs (Nebivolol hcl) .Marland Kitchen.. 1 by mouth once daily 2)  Alprazolam 0.25 Mg Tabs (Alprazolam) .Marland Kitchen.. 1 by mouth every 4 hours as needed for anxiety symptoms 3)  Iron 325 (65 Fe) Mg Tabs (Ferrous sulfate) .... Take 1 two times  a day  Patient Instructions: 1)  it was good to see you today. 2)  test(s) ordered today - your results will be posted on the phone tree for review in 48-72 hours from the time of test completion; call [phone] and enter your 9 digit MRN (listed above on this page, just below your name); if any changes need to be made or there are abnormal results, you will be contacted directly. 3)  continue iron pills 4)  keep followup with drs. ennever and kaplan as scheduled 5)  Please schedule a follow-up appointment in 4 weeks to recheck blood pressure, sooner if problems. cont bystolic until then Prescriptions: BYSTOLIC 5 MG TABS (NEBIVOLOL HCL) 1 by mouth once daily  #30 x 2   Entered by:   Orlan Leavens RMA   Authorized by:   Newt Lukes MD   Signed by:   Orlan Leavens RMA on 06/26/2010   Method used:   Electronically to        [facility] [id number]* (retail)       [address]       [place], [place]  [address]       Ph: [phone]       Fax: [phone]   RxID:   [id number]

## 2010-11-19 NOTE — Assessment & Plan Note (Signed)
Summary: LEGS CRAMP--NO ENERGY---STC   Vital Signs:  Patient profile:   50 year old female Height:      64 inches (162.56 cm) Weight:      197 pounds (89.55 kg) O2 Sat:      99 % Temp:     98.0 degrees F (36.67 degrees C) oral Pulse rate:   134 / minute BP sitting:   152 / 98  (left arm) Cuff size:   large  Vitals Entered By: Orlan Leavens RMA (June 17, 2010 11:22 AM) CC: Leg cramp & no energy Is Patient Diabetic? No Pain Assessment Patient in pain? yes     Location: legs Type: cramping   Primary Care Provider:  Newt Lukes MD  CC:  Leg cramp & no energy.  History of Present Illness: here for c/o leg cramping onset 6 days ago -  felt related to new bp med - lisinorpil-hctz - so stopped same feels dry mouth  also light headed when walking (improves with rest) no CP or SOB, ongoing panic attacks as before  reviewed chronic med issues: anxiety - onset years ago (when lost job/house 2004) but progressively worse, esp last few months - denies recent stressors or change in life events - +hx same 20years ago  - use trial xanax for short period of time then - denies depression signs - no sadness or sleep problems - described as feeling "closed in" at work and "jumpy inside" - assoc with occ palpitations and tremor in hands -  no change in BMs, + weight gain (10lbs in past year) trial ciltalopram rx'd 04/2010 - not taking due to fear of suicide risk on warning labels also not needing BZ - "i'm ok"  HTN - hx same, prev on lisinopril but not in few years - no known kidney dz or cardiac problems - +mild swelling in feet chronic - no SOB or HA begun on HCTZ prev - ?cramping as SE so now off med combo-  3) ITP hx - no bleeding or bruising - no changes prev followed with heme for same, last seen 2003  Clinical Review Panels:  CBC   WBC:  13.2 (05/10/2010)   RBC:  4.25 (05/10/2010)   Hgb:  13.2 (05/10/2010)   Hct:  40.4 (05/10/2010)   Platelets:  87.0  (05/10/2010)   MCV  95.0 (05/10/2010)   MCHC  32.7 (05/10/2010)   RDW  16.5 (05/10/2010)   PMN:  70.8 (05/10/2010)   Lymphs:  18.9 (05/10/2010)   Monos:  6.7 (05/10/2010)   Eosinophils:  3.0 (05/10/2010)   Basophil:  0.6 (05/10/2010)  Complete Metabolic Panel   Glucose:  105 (05/10/2010)   Sodium:  140 (05/10/2010)   Potassium:  3.7 (05/10/2010)   Chloride:  105 (05/10/2010)   CO2:  26 (05/10/2010)   BUN:  9 (05/10/2010)   Creatinine:  0.8 (05/10/2010)   Albumin:  4.2 (09/28/2007)   Total Protein:  7.2 (09/28/2007)   Calcium:  9.1 (05/10/2010)   Total Bili:  0.4 (09/28/2007)   Alk Phos:  79 (09/28/2007)   SGPT (ALT):  14 (09/28/2007)   SGOT (AST):  14 (09/28/2007)   Current Medications (verified): 1)  Citalopram Hydrobromide 10 Mg Tabs (Citalopram Hydrobromide) .Marland Kitchen.. 1 By Mouth Once Daily 2)  Lisinopril-Hydrochlorothiazide 20-25 Mg Tabs (Lisinopril-Hydrochlorothiazide) .Marland Kitchen.. 1 By Mouth Once Daily 3)  Alprazolam 0.25 Mg Tabs (Alprazolam) .Marland Kitchen.. 1 By Mouth Every 4 Hours As Needed For Anxiety Symptoms  Allergies (verified): 1)  ! Morphine  Past  History:  Past Medical History: Hypertension ITP - mild leukocytosis dating back 09/2007 cholelithiasis anxiety  MD roster: heme - ennever  Review of Systems  The patient denies hoarseness, chest pain, syncope, peripheral edema, prolonged cough, headaches, and abdominal pain.    Physical Exam  General:  overweight-appearing.  alert, well-developed, well-nourished, and cooperative to examination.    Mouth:  dry MM Lungs:  normal respiratory effort, no intercostal retractions or use of accessory muscles; normal breath sounds bilaterally - no crackles and no wheezes.    Heart:  normal rate, slightly tachy rhythm, no murmur, and no rub. BLE with no edema.  Psych:  Oriented X3, memory intact for recent and remote, normally interactive, good eye contact, mild anxious appearing, not depressed appearing, and not agitated.       Impression & Recommendations:  Problem # 1:  FATIGUE (ICD-780.79) ? side effect BP med - now off lisinopril-hctz - reviewed recent labs 7/22 - tsh ok, mild linc wbc - chronic on [facility] (at least 09/2007) recehck now and ask heme to re-eval same  esp in setting of ITP - see next still untreated anxiety component - may need to revisit after other acute med issues resolved -  Orders: TLB-CBC Platelet - w/Differential (85025-CBCD) Oncology Referral (Oncology)  Problem # 2:  HYPERTENSION (ICD-401.9)  Her updated medication list for this problem includes:    Bystolic 5 Mg Tabs (Nebivolol hcl) .Marland Kitchen... 1 by mouth once daily  improved but not yet at goal on lisinopril - intol of hctz component with dehydration and cramping symptoms - stop same start bystolic trial will reasses at f/u in 1-2 weeks - will need FLP at future OV when fasting  BP today: 152/98 Prior BP: 148/102 (05/30/2010) Prior BP: 152/112 (05/10/2010)  Labs Reviewed: K+: 3.7 (05/10/2010) Creat: : 0.8 (05/10/2010)   Chol: 178 (09/28/2007)   HDL: 49 (09/28/2007)   LDL: 117 (09/28/2007)   TG: 150 (09/28/2007)  Problem # 3:  LEUKOCYTOSIS UNSPECIFIED (ICD-288.60) long hx ITP - mild persisitng leukocytosis noted -  recheck now, esp with fatigue and refer to heme for re-eval of same - ?BM bx needed Orders: TLB-CBC Platelet - w/Differential (85025-CBCD) Oncology Referral (Oncology)  Problem # 4:  IMMUNE THROMBOCYTOPENIC PURPURA (ICD-287.31)  Orders: TLB-CBC Platelet - w/Differential (85025-CBCD) Oncology Referral (Oncology)  hx same 1987 - reports no sig issues since splenectomy '87 - check lab now and send for prior records/labs  Problem # 5:  ANXIETY STATE, UNSPECIFIED (ICD-300.00)  The following medications were removed from the medication list:    Citalopram Hydrobromide 10 Mg Tabs (Citalopram hydrobromide) .Marland Kitchen... 1 by mouth once daily Her updated medication list for this problem includes:    Alprazolam  0.25 Mg Tabs (Alprazolam) .Marland Kitchen... 1 by mouth every 4 hours as needed for anxiety symptoms  has not yet started SSRI and not using backup BZ - f/u after eval of BP and fatigue to review symptoms and poss meds  Discussed medication use and relaxation techniques.   Complete Medication List: 1)  Bystolic 5 Mg Tabs (Nebivolol hcl) .Marland Kitchen.. 1 by mouth once daily 2)  Alprazolam 0.25 Mg Tabs (Alprazolam) .Marland Kitchen.. 1 by mouth every 4 hours as needed for anxiety symptoms  Patient Instructions: 1)  it was good to see you today. 2)  stop lisinopril hctz -  3)  hydrate - lots of fuids - avoid alcohol, caffiene and carbonation for next 48h until feeling better 4)  start bystolic for blood pressure - 2 week samples  given - Please take as directed. Contact our office if you believe you're having problems with the medication(s).  5)  test(s) ordered today - your results will be posted on the phone tree for review in 48-72 hours from the time of test completion; call [phone] and enter your 9 digit MRN (listed above on this page, just below your name); if any changes need to be made or there are abnormal results, you will be contacted directly. 6)  we'll make referral to dr. Myna Hidalgo for review for your blood counts (low platelets, high white cells). Our office will contact you regarding this appointment once made.  7)  Please schedule a follow-up appointment in 1-2 weeks to recheck blood pressure and fatigue symptoms, call sooner if problems.

## 2010-11-19 NOTE — Assessment & Plan Note (Signed)
Summary: 1 MO ROV /NWS  #   Vital Signs:  Patient profile:   50 year old female Height:      64 inches (162.56 cm) Weight:      191.8 pounds (87.18 kg) O2 Sat:      99 % on Room air Temp:     97.6 degrees F (36.44 degrees C) oral Pulse rate:   95 / minute BP sitting:   162 / 100  (left arm) Cuff size:   large  Vitals Entered By: Orlan Leavens RMA (July 24, 2010 10:20 AM)  O2 Flow:  Room air CC: 1 Month follow-up Is Patient Diabetic? No Pain Assessment Patient in pain? no        Primary Care Provider:  Newt Lukes MD  CC:  1 Month follow-up.  History of Present Illness: ABL anemia - iron defic -05/2010 hosp for same feels "200%" better since transfusion 2U PRBCs last week - no fatigue, no pain, no cramping, not weak - no HA or fever - no SOB or cough - no D/C no bleeding since last heavy menses - clotting and menorrhgia reviewed - plans to see gyn next week taking iron two times a day   anxiety - onset years ago (when lost job/house 2004) but progressively worse, esp last few months - denies recent stressors or change in life events - +hx same 20years ago  - use trial xanax for short period of time then - denies depression signs - no sadness or sleep problems - described as feeling "closed in" at work and "jumpy inside" - assoc with occ palpitations and tremor in hands -  no change in BMs, + weight gain (10lbs in past year) trial ciltalopram rx'd 04/2010 - not taking due to fear of suicide risk on warning labels also not needing BZ - "i'm ok"  HTN - uncontrolled despite meds hx same, prev on lisinopril with reported good control- no known kidney dz or cardiac problems - +mild swelling in feet chronic - no SOB or HA begun on combo HCTZ-ACEI mid 04/2010- ?cramping as SE so now off med combo- tol bystolic w/o adv effects - reports BP well controlled at home until past 2 weeks no CP, HA or weakness  ITP -recent flare and inc in symptoms - bleeding (menses) as  well as hypercoag (clot 06/2010)- RLE superficial saphenous thrombosis - started arixtra and coumadin last week - following with heme for same  Clinical Review Panels:  CBC   WBC:  17.8 (07/19/2010)   RBC:  3.98 (07/19/2010)   Hgb:  12.6 (07/19/2010)   Hct:  38.9 (07/19/2010)   Platelets:  277 (07/19/2010)   MCV  97.7 (07/19/2010)   MCHC  33.1 (06/26/2010)   RDW  17.6 (07/19/2010)   PMN:  74.1 (07/19/2010)   Lymphs:  14 (06/26/2010)   Monos:  8.7 (07/19/2010)   Eosinophils:  0.1 (07/19/2010)   Basophil:  0.6 (07/19/2010)   -  Date:  07/19/2010    WBC: 17.8    HGB: 12.6    HCT: 38.9    RBC: 3.98    PLT: 277    MCV: 97.7    RDW: 17.6    Neutrophil: 74.1    Lymphs: 16.5    Monos: 8.7    Eos: 0.1    Basophil: 0.6  Date:  07/12/2010    WBC: 27.8    HGB: 12.1    HCT: 37.5    RBC: 3.85    PLT:  294    MCV: 97.4    RDW: 18.9    Neutrophil: 70.2    Lymphs: 19.5    Monos: 9.7    Eos: 0.3    Basophil: 0.3  Date:  07/05/2010    WBC: 21.1    HGB: 11.8    HCT: 36.8    RBC: 3.80    PLT: 14    MCV: 96.8    RDW: 19.3    Neutrophil: 67.9    Lymphs: 23.3    Monos: 5.5    Eos: 3.1    Basophil: 0.2  Current Medications (verified): 1)  Bystolic 5 Mg Tabs (Nebivolol Hcl) .Marland Kitchen.. 1 By Mouth Once Daily 2)  Alprazolam 0.25 Mg Tabs (Alprazolam) .Marland Kitchen.. 1 By Mouth Every 4 Hours As Needed For Anxiety Symptoms 3)  Iron 325 (65 Fe) Mg Tabs (Ferrous Sulfate) .... Take 1 Two Times A Day 4)  Coumadin 5 Mg Tabs (Warfarin Sodium) .... Take 1 By Mouth in The Evening 5)  Arixtra 7.5 Mg/0.65ml Soln (Fondaparinux Sodium) .... As Directed  Allergies (verified): 1)  ! Morphine  Past History:  Past Medical History: Hypertension ITP - also mild leukocytosis dating back 09/2007 cholelithiasis anxiety anemia - iron defic w/ menorrhagia s/p 2U PRBC txfn 04/2010  MD roster:  heme - ennever gyn- richard kaplan   Review of Systems  The patient denies fever, weight loss, chest pain,  syncope, dyspnea on exertion, peripheral edema, and headaches.    Physical Exam  General:  nontoxic. mildly overweight-appearing.  alert, well-developed, well-nourished, and cooperative to examination.    Lungs:  normal respiratory effort, no intercostal retractions or use of accessory muscles; normal breath sounds bilaterally - no crackles and no wheezes.    Heart:  normal rate, slightly tachy rhythm, no murmur, and no rub. BLE with trace edema R>L.    Impression & Recommendations:  Problem # 1:  HYPERTENSION (ICD-401.9)  Her updated medication list for this problem includes:    Bystolic 5 Mg Tabs (Nebivolol hcl) .Marland Kitchen... 1 by mouth once daily    Benicar Hct 40-25 Mg Tabs (Olmesartan medoxomil-hctz) .Marland Kitchen... 1 by mouth once daily  not yet at goal  intol of lisinopril - prev intol of hctz component with dehydration and cramping symptoms 05/2010 - but related to anemia cont bystolic and add ARB/diuretic will reasses at f/u in 2 weeks - will need FLP at future OV when fasting   Prior BP: 148/102 (05/30/2010) Prior BP: 152/112 (05/10/2010) Prior BP: 152/98 (06/17/2010)  BP today: 162/100 Prior BP: 162/92 (06/26/2010)  Labs Reviewed: K+: 3.7 (06/17/2010) Creat: : 0.89 (06/17/2010)   Chol: 178 (09/28/2007)   HDL: 49 (09/28/2007)   LDL: 117 (09/28/2007)   TG: 150 (09/28/2007)  Orders: Prescription Created Electronically [phone])  Problem # 2:  IMMUNE THROMBOCYTOPENIC PURPURA (ICD-287.31) recurrent symptoms - labs and mgmt as ongoing by heme - now with RLE superficial thrombosis <1 week-  on anticoag due to conern for hypercoag state - f/u heme as planned  Problem # 3:  ANEMIA-IRON DEFICIENCY (ICD-280.9)  Her updated medication list for this problem includes:    Iron 325 (65 Fe) Mg Tabs (Ferrous sulfate) .Marland Kitchen... Take 1 two times a day  recent hosp for PRBC transfusion reviewed presumably due to menorrhgia - s/p appt with gyn  - ?endometrial bx also concern for primary BM issue with  chronic ITP and persistining leukocytosis - to f/u heme this week cont iron pills  Hgb: 9.7 (06/26/2010)   Hct: 29.3 (06/26/2010)  Platelets: See Comment K/uL (06/26/2010) RBC: 3.22 (06/26/2010)   RDW: 17.9 (06/26/2010)   WBC: 21.2 (06/26/2010) MCV: 91.0 (06/26/2010)   MCHC: 33.1 (06/26/2010) TSH: 1.077 (06/17/2010)  Complete Medication List: 1)  Bystolic 5 Mg Tabs (Nebivolol hcl) .Marland Kitchen.. 1 by mouth once daily 2)  Alprazolam 0.25 Mg Tabs (Alprazolam) .Marland Kitchen.. 1 by mouth every 4 hours as needed for anxiety symptoms 3)  Iron 325 (65 Fe) Mg Tabs (Ferrous sulfate) .... Take 1 two times a day 4)  Coumadin 5 Mg Tabs (Warfarin sodium) .... Take 1 by mouth in the evening 5)  Arixtra 7.5 Mg/0.42ml Soln (Fondaparinux sodium) .... As directed 6)  Benicar Hct 40-25 Mg Tabs (Olmesartan medoxomil-hctz) .Marland Kitchen.. 1 by mouth once daily  Patient Instructions: 1)  it was good to see you today. 2)  labs and med reviewed 3)  start benicar HCT in addition to bystolic for blood pressure - 2 week sample + coupon card given today - 4)  Please schedule a follow-up appointment in 2 weeks to recheck blood pressure and meds, call sooner if problems.  Prescriptions: BENICAR HCT 40-25 MG TABS (OLMESARTAN MEDOXOMIL-HCTZ) 1 by mouth once daily  #30 x 1   Entered and Authorized by:   Newt Lukes MD   Signed by:   Newt Lukes MD on 07/24/2010   Method used:   Electronically to        [facility] [id number]* (retail)       [address]       [place], [place]  [address]       Ph: [phone]       Fax: [phone]   RxID:   [id number]

## 2010-11-19 NOTE — Progress Notes (Signed)
Summary: ALT med  Phone Note Call from Patient Call back at Work Phone [phone]   Caller: Patient [phone] VM OK Summary of Call: Pt called stating that her BP meds are not working, BP is still elevated at 160-180/100. Pt is requesting an alternate medication but she suggests VAL consult with Dr Myna Hidalgo.  Initial call taken by: Margaret Pyle, CMA,  July 22, 2010 10:46 AM  Follow-up for Phone Call        needs OV to discuss changes - thanks Follow-up by: Newt Lukes MD,  July 22, 2010 10:53 AM  Additional Follow-up for Phone Call Additional follow up Details #1::        Pt advised to make ROV via VM Additional Follow-up by: Margaret Pyle, CMA,  July 22, 2010 10:56 AM

## 2010-11-19 NOTE — Assessment & Plan Note (Signed)
Summary: 2 WK ROV /NWS  #   Vital Signs:  Patient profile:   50 year old female Height:      64 inches (162.56 cm) Weight:      191 pounds (86.82 kg) O2 Sat:      98 % on Room air Temp:     98.6 degrees F (37.00 degrees C) oral Pulse rate:   91 / minute BP sitting:   132 / 88  (left arm) Cuff size:   regular  Vitals Entered By: Orlan Leavens RMA (August 05, 2010 11:13 AM)  O2 Flow:  Room air CC: 2 week follow-up Is Patient Diabetic? No Pain Assessment Patient in pain? no        Primary Care Provider:  Newt Lukes MD  CC:  2 week follow-up.  History of Present Illness: ABL anemia - iron defic -05/2010 hosp for same no bleeding since last heavy menses clotting and menorrhgia reviewed -  s/p gyn - no bcp due to recent clot taking iron two times a day   anxiety - onset years ago (when lost job/house 2004) but progressively worse, esp last few months - denies recent stressors or change in life events - +hx same 20years ago  - use trial xanax for short period of time then - denies depression signs - no sadness or sleep problems - described as feeling "closed in" at work and "jumpy inside" - assoc with occ palpitations and tremor in hands -  no change in BMs, + weight gain (10lbs in past year) trial ciltalopram rx'd 04/2010 - not taking due to fear of suicide risk on warning labels also not needing BZ - "i'm ok"  HTN - uncontrolled despite meds - exac by stress hx same, prev on lisinopril with reported good control- no known kidney dz or cardiac problems - +mild swelling in feet chronic - no SOB or HA begun on combo HCTZ-ACEI mid 04/2010- ?cramping as SE so now off med combo- tol bystolic w/o adv effects - reports BP well controlled at home  began ZRB/hct 2 weeks ago no CP, HA or weakness  ITP -recent flare and inc in symptoms - bleeding (menses) as well as hypercoag (clot 06/2010)- RLE superficial saphenous thrombosis - started arixtra and coumadin last week  - following with heme for same  Current Medications (verified): 1)  Bystolic 5 Mg Tabs (Nebivolol Hcl) .Marland Kitchen.. 1 By Mouth Once Daily 2)  Alprazolam 0.25 Mg Tabs (Alprazolam) .Marland Kitchen.. 1 By Mouth Every 4 Hours As Needed For Anxiety Symptoms 3)  Iron 325 (65 Fe) Mg Tabs (Ferrous Sulfate) .... Take 1 Two Times A Day 4)  Coumadin 5 Mg Tabs (Warfarin Sodium) .... Take 1 By Mouth in The Evening 5)  Arixtra 7.5 Mg/0.10ml Soln (Fondaparinux Sodium) .... As Directed 6)  Benicar Hct 40-25 Mg Tabs (Olmesartan Medoxomil-Hctz) .Marland Kitchen.. 1 By Mouth Once Daily  Allergies (verified): 1)  ! Morphine  Past History:  Past Medical History: Hypertension ITP - also mild leukocytosis dating back 09/2007 cholelithiasis anxiety anemia - iron defic w/ menorrhagia s/p 2U PRBC txfn 04/2010    MD roster:  heme - ennever gyn- richard kaplan   Review of Systems  The patient denies weight loss, chest pain, syncope, and peripheral edema.    Physical Exam  General:  nontoxic. mildly overweight-appearing.  alert, well-developed, well-nourished, and cooperative to examination.    Lungs:  normal respiratory effort, no intercostal retractions or use of accessory muscles; normal breath sounds bilaterally - no  crackles and no wheezes.    Heart:  normal rate, slightly tachy rhythm, no murmur, and no rub. BLE with trace edema R>L.    Impression & Recommendations:  Problem # 1:  HYPERTENSION (ICD-401.9) Assessment Improved  Her updated medication list for this problem includes:    Bystolic 5 Mg Tabs (Nebivolol hcl) .Marland Kitchen... 1 by mouth once daily    Benicar Hct 40-25 Mg Tabs (Olmesartan medoxomil-hctz) .Marland Kitchen... 1 by mouth once daily  BP today: 132/88 Prior BP: 162/100 (07/24/2010)  Labs Reviewed: K+: 3.7 (06/17/2010) Creat: : 0.89 (06/17/2010)   Chol: 178 (09/28/2007)   HDL: 49 (09/28/2007)   LDL: 117 (09/28/2007)   TG: 150 (09/28/2007)  improved - nearing goal intol of lisinopril - prev intol of hctz component with  dehydration and cramping symptoms 05/2010 - but related to anemia cont bystolic and ARB/diuretic  Problem # 2:  IMMUNE THROMBOCYTOPENIC PURPURA (ICD-287.31)  recurrent symptoms - improved s/p decadron pulse tx - labs and mgmt as ongoing by heme  superficial RLE thrombosis -06/2010  on anticoag due to conern for hypercoag state - f/u heme as ongoing  Complete Medication List: 1)  Bystolic 5 Mg Tabs (Nebivolol hcl) .Marland Kitchen.. 1 by mouth once daily 2)  Alprazolam 0.25 Mg Tabs (Alprazolam) .Marland Kitchen.. 1 by mouth every 4 hours as needed for anxiety symptoms 3)  Iron 325 (65 Fe) Mg Tabs (Ferrous sulfate) .... Take 1 two times a day 4)  Coumadin 5 Mg Tabs (Warfarin sodium) .... Take 1 by mouth in the evening 5)  Arixtra 7.5 Mg/0.92ml Soln (Fondaparinux sodium) .... As directed 6)  Benicar Hct 40-25 Mg Tabs (Olmesartan medoxomil-hctz) .Marland Kitchen.. 1 by mouth once daily  Patient Instructions: 1)  it was good to see you today. 2)  labs and med reviewed 3)  continue benicar HCT and  bystolic for blood pressure  4)  Please schedule a follow-up appointment in 3 month to recheck blood pressure and meds, call sooner if problems.  Prescriptions: BYSTOLIC 5 MG TABS (NEBIVOLOL HCL) 1 by mouth once daily  #30 x 6   Entered and Authorized by:   Newt Lukes MD   Signed by:   Newt Lukes MD on 08/05/2010   Method used:   Electronically to        [facility] #[id number]* (retail)       [address]       [place], [place]  [address]       Ph: [phone]       Fax: [phone]   RxID:   [id number] BENICAR HCT 40-25 MG TABS (OLMESARTAN MEDOXOMIL-HCTZ) 1 by mouth once daily  #30 x 6   Entered and Authorized by:   Newt Lukes MD   Signed by:   Newt Lukes MD on 08/05/2010   Method used:   Electronically to        [facility] #[id number]* (retail)       [address]       [place], [place]  [address]       Ph:  [phone]       Fax: [phone]   RxID:   [id number]    Orders Added: 1)  Est. Patient Level III [32355]

## 2010-11-19 NOTE — Letter (Signed)
Summary: Complete Physical / [facility]  Complete Physical / [facility]   Imported By: Lennie Odor 05/23/2010 14:40:22  _____________________________________________________________________  External Attachment:    Type:   Image     Comment:   External Document

## 2010-11-19 NOTE — Assessment & Plan Note (Signed)
Summary: FU Janice Martin #   Vital Signs:  Patient profile:   50 year old female Height:      64 inches (162.56 cm) Weight:      197.6 pounds (89.82 kg) O2 Sat:      97 % on Room air Temp:     97.3 degrees F (36.28 degrees C) oral Pulse rate:   105 / minute BP sitting:   148 / 102  (left arm) Cuff size:   large  Vitals Entered By: Orlan Leavens RMA (May 30, 2010 8:50 AM)  O2 Flow:  Room air CC: 4 week follow-up Is Patient Diabetic? No Pain Assessment Patient in pain? no      Comments Pt states she has'nt started the citalopram yet   Primary Care Provider:  Newt Lukes MD  CC:  4 week follow-up.  History of Present Illness: here for f/u  1) anxiety - onset years ago (when lost job/house 2004) but progressively worse, esp last few months - denies recent stressors or change in life events - +hx same 20years ago  - use trial xanax for short period of time then - denies depression signs - no sadness or sleep problems - described as feeling "closed in" at work and "jumpy inside" - assoc with occ palpitations and tremor in hands -  no change in BMs, + weight gain (10lbs in past year) trial ciltalopram rx'd 04/2010 - not taking due to fear of suicide risk on warning labels also not needing BZ - "i'm ok"  2) c/o HTN - hx same, prev on lisinopril but not in few years - no known kidney dz or cardiac problems - +mild swelling in feet chronic - no SOB or HA begun on HCTZ last OV -  3) ITP hx - no bleeding or bruising - no changes  Clinical Review Panels:  Immunizations   Last Tetanus Booster:  Historical given @ work (10/20/2006)   Last Flu Vaccine:  Historical given @ work (07/20/2009)  Lipid Management   Cholesterol:  178 (09/28/2007)   LDL (bad choesterol):  117 (09/28/2007)   HDL (good cholesterol):  49 (09/28/2007)   Triglycerides:  150 (09/28/2007)  CBC   WBC:  13.2 (05/10/2010)   RBC:  4.25 (05/10/2010)   Hgb:  13.2 (05/10/2010)   Hct:  40.4 (05/10/2010)   Platelets:  87.0 (05/10/2010)   MCV  95.0 (05/10/2010)   MCHC  32.7 (05/10/2010)   RDW  16.5 (05/10/2010)   PMN:  70.8 (05/10/2010)   Lymphs:  18.9 (05/10/2010)   Monos:  6.7 (05/10/2010)   Eosinophils:  3.0 (05/10/2010)   Basophil:  0.6 (05/10/2010)  Complete Metabolic Panel   Glucose:  105 (05/10/2010)   Sodium:  140 (05/10/2010)   Potassium:  3.7 (05/10/2010)   Chloride:  105 (05/10/2010)   CO2:  26 (05/10/2010)   BUN:  9 (05/10/2010)   Creatinine:  0.8 (05/10/2010)   Albumin:  4.2 (09/28/2007)   Total Protein:  7.2 (09/28/2007)   Calcium:  9.1 (05/10/2010)   Total Bili:  0.4 (09/28/2007)   Alk Phos:  79 (09/28/2007)   SGPT (ALT):  14 (09/28/2007)   SGOT (AST):  14 (09/28/2007)   Current Medications (verified): 1)  Citalopram Hydrobromide 10 Mg Tabs (Citalopram Hydrobromide) .Marland Kitchen.. 1 By Mouth Once Daily 2)  Hydrochlorothiazide 25 Mg Tabs (Hydrochlorothiazide) .Marland Kitchen.. 1 By Mouth Once Daily 3)  Alprazolam 0.25 Mg Tabs (Alprazolam) .Marland Kitchen.. 1 By Mouth Every 4 Hours As Needed For Anxiety Symptoms  Allergies (  verified): 1)  ! Morphine  Past History:  Past Medical History: Hypertension ITP cholelithiasis anxiety  Review of Systems  The patient denies weight loss, chest pain, peripheral edema, headaches, and depression.    Physical Exam  General:  overweight-appearing.  alert, well-developed, well-nourished, and cooperative to examination.    Lungs:  normal respiratory effort, no intercostal retractions or use of accessory muscles; normal breath sounds bilaterally - no crackles and no wheezes.    Heart:  normal rate, regular rhythm, no murmur, and no rub. BLE with trace edema.  Psych:  Oriented X3, memory intact for recent and remote, normally interactive, good eye contact, mild-mod anxious appearing, not depressed appearing, and not agitated.      Impression & Recommendations:  Problem # 1:  HYPERTENSION (ICD-401.9)  Her updated medication list for this problem  includes:    Lisinopril-hydrochlorothiazide 20-25 Mg Tabs (Lisinopril-hydrochlorothiazide) .Marland Kitchen... 1 by mouth once daily improved but not yet at goal - reviewed prior records - change to combo tx now- will reasses at f/u in 4-6 weeks - will need FLP at future OV when fasting  BP today: 148/102 Prior BP: 152/112 (05/10/2010)  Labs Reviewed: K+: 3.7 (05/10/2010) Creat: : 0.8 (05/10/2010)   Chol: 178 (09/28/2007)   HDL: 49 (09/28/2007)   LDL: 117 (09/28/2007)   TG: 150 (09/28/2007)  Orders: Prescription Created Electronically [phone])  Problem # 2:  ANXIETY STATE, UNSPECIFIED (ICD-300.00)  Her updated medication list for this problem includes:    Citalopram Hydrobromide 10 Mg Tabs (Citalopram hydrobromide) .Marland Kitchen... 1 by mouth once daily    Alprazolam 0.25 Mg Tabs (Alprazolam) .Marland Kitchen... 1 by mouth every 4 hours as needed for anxiety symptoms  has not yet started SSRI and not using backup BZ - but will as needed - safety reviewed - pt will reconsider - f/u 4-6 weeks to review symptoms and meds  Discussed medication use and relaxation techniques.   Complete Medication List: 1)  Citalopram Hydrobromide 10 Mg Tabs (Citalopram hydrobromide) .Marland Kitchen.. 1 by mouth once daily 2)  Lisinopril-hydrochlorothiazide 20-25 Mg Tabs (Lisinopril-hydrochlorothiazide) .Marland Kitchen.. 1 by mouth once daily 3)  Alprazolam 0.25 Mg Tabs (Alprazolam) .Marland Kitchen.. 1 by mouth every 4 hours as needed for anxiety symptoms  Patient Instructions: 1)  it was good to see you today. 2)  ok to start everyday medications for anxiety (citalopram) if needed-  3)  ok to use alprazolam as needed for panic attack/anxiety spells - your new prescription for combo blood pressure control has been electronically submitted to your pharmacy. Please take as directed. Contact our office if you believe you're having problems with the medication(s).  4)  Please schedule a follow-up appointment in 4-6 weeks to recheck blood pressure and medications, call sooner if  problems.  Prescriptions: LISINOPRIL-HYDROCHLOROTHIAZIDE 20-25 MG TABS (LISINOPRIL-HYDROCHLOROTHIAZIDE) 1 by mouth once daily  #30 x 6   Entered and Authorized by:   Newt Lukes MD   Signed by:   Newt Lukes MD on 05/30/2010   Method used:   Electronically to        [id number]* (retail)       [address]       [place], [place]  [address]       Ph: [phone]       Fax: [phone]   RxID:   [id number]

## 2010-11-19 NOTE — Progress Notes (Signed)
Summary: critical labs - hgb 5.3  Phone Note From Other Clinic   Caller: lab Request: Talk with Provider Details for Reason: critical lab Summary of Call: Want to let md know WBC 18.6, Hemoglobin 5.9, Hematocrit 17.5 and platelet 61. will fax up report Initial call taken by: Orlan Leavens RMA,  June 17, 2010 2:10 PM  Follow-up for Phone Call        called pt  she needs to go to  [facility] admitting. Called admitting [facility] will be [facility] to [facility]. Faxed over  records to [facility] @ [phone] Follow-up by: Orlan Leavens RMA,  June 17, 2010 3:15 PM  Additional Follow-up for Phone Call Additional follow up Details #1::        ok - see lab addendum - thanks Additional Follow-up by: Newt Lukes MD,  June 17, 2010 5:18 PM

## 2010-11-19 NOTE — Assessment & Plan Note (Signed)
Summary: NEW / Janice Martin   Vital Signs:  Patient profile:   50 year old female Height:      64 inches (162.56 cm) Weight:      196.8 pounds (89.45 kg) BMI:     33.90 O2 Sat:      97 % on Room air Temp:     97.5 degrees F (36.39 degrees C) oral Pulse rate:   115 / minute BP sitting:   152 / 112  (left arm) Cuff size:   regular  Vitals Entered By: Orlan Leavens (May 10, 2010 8:03 AM)  O2 Flow:  Room air CC: New patient Is Patient Diabetic? No Pain Assessment Patient in pain? no        Primary Care Provider:  Newt Lukes MD  CC:  New patient.  History of Present Illness: new pt to me and oiur practice, here to est care - prev followed at [facility]  1) c/o anxiety - onset years ago (when lost job/house 2004) but progressively worse, esp last few months - denies recent stressors or change in life events - +hx same 20years ago  - use trial xanax for short period of time then - denies depression signs - no sadness or sleep problems - described as feeling "closed in" at work and "jumpy inside" - assoc with occ palpitations and tremor in hands -  no change in BMs, + weight gain (10lbs in past year)  2) c/o HTN - hx same, prev on lisinopril but not in few years - no known kidney dz or cardiac problems - +mild swelling in feet chronic - no SOB or HA   Preventive Screening-Counseling & Management  Alcohol-Tobacco     Alcohol drinks/day: 0     Alcohol Counseling: not indicated; patient does not drink     Smoking Status: never     Tobacco Counseling: not indicated; no tobacco use  Caffeine-Diet-Exercise     Exercise Counseling: to improve exercise regimen     Depression Counseling: not indicated; screening negative for depression  Safety-Violence-Falls     Seat Belt Counseling: not indicated; patient wears seat belts     Helmet Counseling: not applicable     Firearms in the Home: no firearms in the home     Firearm Counseling: not applicable     Smoke Detectors:  yes     Violence Counseling: not indicated; no violence risk noted     Fall Risk Counseling: not indicated; no significant falls noted  Clinical Review Panels:  Immunizations   Last Tetanus Booster:  Historical given @ work (10/20/2006)   Last Flu Vaccine:  Historical given @ work (07/20/2009)   Current Medications (verified): 1)  None  Allergies (verified): 1)  ! 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 I have reviewed all other systems and they were negative.   Physical Exam  General:  overweight-appearing.  alert, well-developed, well-nourished, and cooperative to examination.    Head:  Normocephalic and atraumatic without obvious abnormalities. No apparent alopecia or balding. Eyes:  vision  grossly intact; pupils equal, round and reactive to light.  conjunctiva and lids normal.    Ears:  normal pinnae bilaterally, without erythema, swelling, or tenderness to palpation. TMs clear, without effusion, or cerumen impaction. Hearing grossly normal bilaterally  Mouth:  teeth and gums in good repair; mucous membranes moist, without lesions or ulcers. oropharynx clear without exudate, no erythema.  Neck:  thick, but supple, full ROM, no masses, no thyromegaly; no thyroid nodules or tenderness. no JVD or carotid bruits.   Lungs:  normal respiratory effort, no intercostal retractions or use of accessory muscles; normal breath sounds bilaterally - no crackles and no wheezes.    Heart:   normal rate, regular rhythm, no murmur, and no rub. BLE with trace edema.  Abdomen:  soft, non-tender, normal bowel sounds, no distention; no masses and no appreciable hepatomegaly or splenomegaly.   Msk:  No deformity or scoliosis noted of thoracic or lumbar spine.   Neurologic:  alert & oriented X3 and cranial nerves II-XII symetrically intact.  strength normal in all extremities, sensation intact to light touch, and gait normal. speech fluent without dysarthria or aphasia; follows commands with good comprehension.  Skin:  flushed face and chest while discussing anxiety symptoms - otherwise, no rashes, vesicles, ulcers, or erythema. No nodules or irregularity to palpation.  Psych:  Oriented X3, memory intact for recent and remote, normally interactive, good eye contact, mild-mod anxious appearing, not depressed appearing, and not agitated.      Impression & Recommendations:  Problem # 1:  ANXIETY STATE, UNSPECIFIED (ICD-300.00) start SSRI and backup BZ as needed - rx provided and explained risk/benefit of each med and use - pt understands and agrees f/u 2-4 weeks to review symptoms and meds Her updated medication list for this problem includes:    Citalopram Hydrobromide 10 Mg Tabs (Citalopram hydrobromide) .Marland Kitchen... 1 by mouth once daily    Alprazolam 0.25 Mg Tabs (Alprazolam) .Marland Kitchen... 1 by mouth every 4 hours as needed for anxiety symptoms  Orders: Prescription Created Electronically [id number]) TLB-TSH (Thyroid Stimulating Hormone) (84443-TSH)  Problem # 2:  HYPERTENSION (ICD-401.9)  start diuretic -  send for prior records - may also need combo tx but avoid starting too many meds at one time -  will reasses at f/u in 2-4 weeks - will need FLP and future OV when fasting Her updated medication list for this problem includes:    Hydrochlorothiazide 25 Mg Tabs (Hydrochlorothiazide) .Marland Kitchen... 1 by mouth once daily  Orders: Prescription Created Electronically [phone]) TLB-BMP (Basic Metabolic  Panel-BMET) (80048-METABOL)  BP today: 152/112  Problem # 3:  IMMUNE THROMBOCYTOPENIC PURPURA (ICD-287.31) hx same 1987 - reports no sig issues since splenectomy '87 - check lab now and send for prior records/labs Orders: TLB-CBC Platelet - w/Differential ([id number])  Time spent with patient 45 minutes, more than 50% of this time was spent counseling patient on anxiety and medications for mgmt of same, plans to obtain old records and poss SE of new meds -  Complete Medication List: 1)  Citalopram Hydrobromide 10 Mg Tabs (Citalopram hydrobromide) .Marland Kitchen.. 1 by mouth once daily 2)  Hydrochlorothiazide 25 Mg Tabs (Hydrochlorothiazide) .Marland Kitchen.. 1 by mouth once daily 3)  Alprazolam 0.25 Mg Tabs (Alprazolam) .Marland Kitchen.. 1 by mouth every 4 hours as needed for anxiety symptoms  Patient Instructions: 1)  it was good to see you today. 2)  start everyday medications for anxiety (citalopram) and for high blood pressure (hctz) - your prescriptions have been electronically submitted to your pharmacy. Please take  as directed. Contact our office if you believe you're having problems with the medication(s).  3)  ok to use alprazolam as needed for panic attack/anxiety spells - your prescription has been given to you to submit to your pharmacy. Please take as directed. Contact our office if you believe you're having problems with the medication(s).  4)  test(s) ordered today - your results will be posted on the phone tree for review in 48-72 hours from the time of test completion; call [phone] and enter your 9 digit MRN (listed above on this page, just below your name); if any changes need to be made or there are abnormal results, you will be contacted directly.  5)  will send for records from dr. Valentina Lucks at [place] to review - 6)  Please schedule a follow-up appointment week of 05/27/10 to recheck blood pressure and medications, call sooner if problems.  Prescriptions: ALPRAZOLAM 0.25 MG TABS (ALPRAZOLAM) 1 by mouth every  4 hours as needed for anxiety symptoms  #30 x 1   Entered and Authorized by:   Newt Lukes MD   Signed by:   Newt Lukes MD on 05/10/2010   Method used:   Print then Give to Patient   RxID:   [id number] HYDROCHLOROTHIAZIDE 25 MG TABS (HYDROCHLOROTHIAZIDE) 1 by mouth once daily  #30 x 2   Entered and Authorized by:   Newt Lukes MD   Signed by:   Newt Lukes MD on 05/10/2010   Method used:   Electronically to        [facility] #[id number]* (retail)       [address]       [place], [place]  [address]       Ph: [phone]       Fax: [phone]   RxID:   [id number] CITALOPRAM HYDROBROMIDE 10 MG TABS (CITALOPRAM HYDROBROMIDE) 1 by mouth once daily  #30 x 2   Entered and Authorized by:   Newt Lukes MD   Signed by:   Newt Lukes MD on 05/10/2010   Method used:   Electronically to        [facility] #[id number]* (retail)       [address]       [place], [place]  [address]       Ph: [phone]       Fax: [phone]   RxID:   [id number]    Immunization History:  Tetanus/Td Immunization History:    Tetanus/Td:  historical given @ work (10/20/2006)  Influenza Immunization History:    Influenza:  historical given @ work (07/20/2009)

## 2010-11-21 NOTE — Letter (Signed)
Summary: [facility]  [facility]   Imported By: Sherian Rein 10/22/2010 08:50:23  _____________________________________________________________________  External Attachment:    Type:   Image     Comment:   External Document

## 2010-11-21 NOTE — Letter (Signed)
Summary: Appointment - Missed  [facility], Main Office  [address]   [place], [place] [address]   Phone: [phone]  Fax: [phone]     October 09, 2010 MRN: [id number]   [facility] [place] [address] [place], [place]  [address]   Dear Ms. Halling,  Our records indicate you missed your appointment on  12/19/11with Dr. Tenny Craw .It is very important that we reach you to reschedule this appointment. We look forward to participating in your health care needs. Please contact us at the number listed above at your earliest convenience to reschedule this appointment.     Sincerely,  Systems analyst

## 2010-11-25 ENCOUNTER — Encounter (HOSPITAL_BASED_OUTPATIENT_CLINIC_OR_DEPARTMENT_OTHER): Payer: 59 | Admitting: Oncology

## 2010-11-25 ENCOUNTER — Other Ambulatory Visit: Payer: Self-pay | Admitting: Hematology & Oncology

## 2010-11-25 DIAGNOSIS — Z7901 Long term (current) use of anticoagulants: Secondary | ICD-10-CM

## 2010-11-25 DIAGNOSIS — D649 Anemia, unspecified: Secondary | ICD-10-CM

## 2010-11-25 DIAGNOSIS — I82819 Embolism and thrombosis of superficial veins of unspecified lower extremities: Secondary | ICD-10-CM

## 2010-11-25 LAB — CBC WITH DIFFERENTIAL/PLATELET
Basophils Absolute: 0.1 10*3/uL (ref 0.0–0.1)
EOS%: 5.2 % (ref 0.0–7.0)
Eosinophils Absolute: 0.6 10*3/uL — ABNORMAL HIGH (ref 0.0–0.5)
HGB: 13.3 g/dL (ref 11.6–15.9)
MCH: 32.1 pg (ref 25.1–34.0)
MONO#: 0.7 10*3/uL (ref 0.1–0.9)
NEUT#: 6.6 10*3/uL — ABNORMAL HIGH (ref 1.5–6.5)
RDW: 13.4 % (ref 11.2–14.5)
WBC: 11.6 10*3/uL — ABNORMAL HIGH (ref 3.9–10.3)
lymph#: 3.6 10*3/uL — ABNORMAL HIGH (ref 0.9–3.3)

## 2010-11-25 LAB — CHCC SMEAR

## 2010-12-02 ENCOUNTER — Other Ambulatory Visit: Payer: Self-pay | Admitting: Hematology & Oncology

## 2010-12-02 ENCOUNTER — Encounter (HOSPITAL_BASED_OUTPATIENT_CLINIC_OR_DEPARTMENT_OTHER): Payer: 59 | Admitting: Oncology

## 2010-12-02 DIAGNOSIS — D693 Immune thrombocytopenic purpura: Secondary | ICD-10-CM

## 2010-12-02 DIAGNOSIS — Z7901 Long term (current) use of anticoagulants: Secondary | ICD-10-CM

## 2010-12-02 DIAGNOSIS — I82819 Embolism and thrombosis of superficial veins of unspecified lower extremities: Secondary | ICD-10-CM

## 2010-12-02 LAB — CBC WITH DIFFERENTIAL/PLATELET
Eosinophils Absolute: 0.2 10*3/uL (ref 0.0–0.5)
HGB: 13.7 g/dL (ref 11.6–15.9)
LYMPH%: 37.1 % (ref 14.0–49.7)
MONO#: 2.4 10*3/uL — ABNORMAL HIGH (ref 0.1–0.9)
NEUT#: 14.5 10*3/uL — ABNORMAL HIGH (ref 1.5–6.5)
Platelets: 536 10*3/uL — ABNORMAL HIGH (ref 145–400)
RBC: 4.23 10*6/uL (ref 3.70–5.45)
WBC: 27.4 10*3/uL — ABNORMAL HIGH (ref 3.9–10.3)
nRBC: 1 % — ABNORMAL HIGH (ref 0–0)

## 2010-12-02 LAB — TECHNOLOGIST REVIEW

## 2010-12-23 ENCOUNTER — Encounter (HOSPITAL_BASED_OUTPATIENT_CLINIC_OR_DEPARTMENT_OTHER): Payer: 59 | Admitting: Hematology & Oncology

## 2010-12-23 ENCOUNTER — Other Ambulatory Visit: Payer: Self-pay | Admitting: Hematology & Oncology

## 2010-12-23 ENCOUNTER — Ambulatory Visit (HOSPITAL_BASED_OUTPATIENT_CLINIC_OR_DEPARTMENT_OTHER)
Admission: RE | Admit: 2010-12-23 | Discharge: 2010-12-23 | Disposition: A | Discharge status: Home / Self Care | Payer: 59 | Source: Ambulatory Visit | Attending: Hematology & Oncology | Admitting: Hematology & Oncology

## 2010-12-23 DIAGNOSIS — D693 Immune thrombocytopenic purpura: Secondary | ICD-10-CM

## 2010-12-23 DIAGNOSIS — I809 Phlebitis and thrombophlebitis of unspecified site: Secondary | ICD-10-CM

## 2010-12-23 DIAGNOSIS — I82819 Embolism and thrombosis of superficial veins of unspecified lower extremities: Secondary | ICD-10-CM

## 2010-12-23 DIAGNOSIS — Z7901 Long term (current) use of anticoagulants: Secondary | ICD-10-CM

## 2010-12-23 DIAGNOSIS — M79609 Pain in unspecified limb: Secondary | ICD-10-CM | POA: Insufficient documentation

## 2010-12-23 LAB — CBC WITH DIFFERENTIAL (CANCER CENTER ONLY)
EOS%: 6.2 % (ref 0.0–7.0)
MCH: 32.1 pg (ref 26.0–34.0)
MCHC: 34.5 g/dL (ref 32.0–36.0)
MONO%: 8.8 % (ref 0.0–13.0)
NEUT#: 8.9 10*3/uL — ABNORMAL HIGH (ref 1.5–6.5)
Platelets: 327 10*3/uL (ref 145–400)
RDW: 13.1 % (ref 11.1–15.7)

## 2010-12-23 LAB — COMPREHENSIVE METABOLIC PANEL
AST: 18 U/L (ref 0–37)
Alkaline Phosphatase: 72 U/L (ref 39–117)
BUN: 8 mg/dL (ref 6–23)
Creatinine, Ser: 0.85 mg/dL (ref 0.40–1.20)
Total Bilirubin: 0.3 mg/dL (ref 0.3–1.2)

## 2010-12-27 NOTE — Discharge Summary (Signed)
Janice Martin, MACIOLEK                ACCOUNT NO.:  [id number]  MEDICAL RECORD NO.:  [id number]          PATIENT TYPE:  INP  LOCATION:  1339                         FACILITY:  [facility]  PHYSICIAN:  Lajuana Matte, MD  DATE OF BIRTH:  05/21/1961  DATE OF ADMISSION:  08/09/2010 DATE OF DISCHARGE:  08/11/2010                              DISCHARGE SUMMARY   ONCOLOGIST:  Rose Phi. Ennever, M.D.  DISCHARGE DIAGNOSES: 1. Relapsed idiopathic thrombocytopenic purpura, status post bone     marrow biopsy in 1996 followed by splenectomy. 2. History of superficial thrombosis of the right leg. 3. Menorrhagia. 4. Anemia secondary to menorrhagia. 5. Hypertension.  DISCHARGE MEDICATIONS: 1. Decadron 4 mg, take 10 pills by mouth for a total of 40 mg daily     until followup with Dr. Myna Hidalgo. 2. Hydrochlorothiazide 25 mg p.o. daily. 3. Bystolic 5 mg p.o. daily. 4. Benicar 40 mg p.o. daily. 5. Protonix 40 mg p.o. daily. 6. Coumadin 2.5 mg p.o. daily.  FOLLOWUP APPOINTMENTS:  The patient is to follow up with either Dr. Myna Hidalgo or his nurse practitioner, Colman Cater, NP, at the [facility] within the next week.  PROCEDURES:  None.  CONSULTS:  None.  HISTORY OF PRESENT ILLNESS:  Janice Martin is a 50 year old female with a past medical history significant for ITP and abnormal blood cell count who presented to Dr. Gustavo Lah office on August 09, 2010, stating that her menstrual cycle was quite heavy.  She was seen in the office and unfortunately her platelet count was found to be less than 6000.  Her white cell count was 12.7, hemoglobin 12.7, and her INR was 2.5.  Of note, two weeks ago, she presented to Dr. Gustavo Lah office with swelling of her right leg.  A Doppler study was done showing superficial thrombus in the saphenous vein.  She currently is on Coumadin for this.  Prior to Janice Martin being seen in Dr. Gustavo Lah office on August 09, 2010, week ago her platelet count was 353,000.   Other than her menstrual cycle, she had no other bleeding except some slight blood in her mouth.  She did not report any hemoptysis or epistaxis or hematuria.  She had no hematochezia or bright red blood per rectum.  At that point, it was felt that she needed to be admitted for further workup.  Over the course of her hospital stay, Janice Martin was given two doses of IVIG.  Her last dose was on August 10, 2010, with improvement in her platelet count.  Her platelet count was 9000 on August 10, 2010, and today her platelet count 73,000.  She had no obvious bleeding during her hospital stay or any other complications.  She does have some leukocytosis with a white count of 37.6; however, this is secondary to the pulse Decadron.  Her platelet count is stable and she is stable for discharge today.  PHYSICAL EXAM:  VITAL SIGNS:  Temperature 98.1 degrees, pulse 71, respirations 18, blood pressure 160/90, oxygen saturation is 99% on room air. GENERAL:  The patient is sitting up in bed, a well-nourished white female. HEENT:  Normocephalic,  atraumatic skull.  There are no oropharyngeal lesions. NECK:  Supple. LYMPHATICS:  Lymph nodes are negative for any cervical, supraclavicular, or axillary adenopathy. LUNGS: Clear bilaterally. CARDIAC:  Regular rate and rhythm with normal S1, S2.  There are no murmurs, rubs, or bruits. ABDOMEN:  Soft with good bowel sounds.  There is no palpable abdominal mass.  There is no palpable hepatosplenomegaly. MUSCULOSKELETAL:  No tenderness to spine, __________, or hips. EXTREMITIES:  No clubbing, cyanosis, or edema. NEUROLOGIC:  Nonfocal.  DISCHARGE LABORATORY DATA:  White blood cells 37.6, hemoglobin 10.8, hematocrit are 31, platelets 73,000.  INR is 2.27.  DISPOSITION:  The patient is able to return to work tomorrow, August 12, 2010.  TIME SPENT WITH THE PATIENT DOING THIS DICTATION:  Approximately 35 minutes.     Eunice Blase,  PA-C   ______________________________ Lajuana Matte, MD    TS/MEDQ  D:  08/11/2010  T:  08/11/2010  Job:  [id number]  cc:   Vikki Ports A. Felicity Coyer, MD [address] [place], [place] [address]  Electronically Signed by Si Gaul MD on 08/11/2010 09:50:29 PM Electronically Signed by Eunice Blase  on 12/27/2010 50:09:38 AM

## 2011-01-01 LAB — CBC
HCT: 31 % — ABNORMAL LOW (ref 36.0–46.0)
Hemoglobin: 11.8 g/dL — ABNORMAL LOW (ref 12.0–15.0)
MCH: 33.5 pg (ref 26.0–34.0)
MCV: 95.8 fL (ref 78.0–100.0)
MCV: 97.1 fL (ref 78.0–100.0)
RBC: 3.24 MIL/uL — ABNORMAL LOW (ref 3.87–5.11)
RBC: 3.54 MIL/uL — ABNORMAL LOW (ref 3.87–5.11)
WBC: 37.6 10*3/uL — ABNORMAL HIGH (ref 4.0–10.5)

## 2011-01-01 LAB — COMPREHENSIVE METABOLIC PANEL
AST: 18 U/L (ref 0–37)
Alkaline Phosphatase: 64 U/L (ref 39–117)
BUN: 12 mg/dL (ref 6–23)
CO2: 26 mEq/L (ref 19–32)
Chloride: 103 mEq/L (ref 96–112)
Creatinine, Ser: 0.86 mg/dL (ref 0.4–1.2)
GFR calc Af Amer: >60 mL/min (ref >60)
GFR calc non Af Amer: >60 mL/min (ref >60)
Potassium: 3.4 mEq/L — ABNORMAL LOW (ref 3.5–5.1)
Total Bilirubin: 0.3 mg/dL (ref 0.3–1.2)

## 2011-01-01 LAB — PROTIME-INR
INR: 1.92 — ABNORMAL HIGH (ref 0.00–1.49)
INR: 2.27 — ABNORMAL HIGH (ref 0.00–1.49)

## 2011-01-03 LAB — CBC
Hemoglobin: 7.9 g/dL — ABNORMAL LOW (ref 12.0–15.0)
MCHC: 33.9 g/dL (ref 30.0–36.0)
Platelets: 50 10*3/uL — ABNORMAL LOW (ref 150–400)
RDW: 16 % — ABNORMAL HIGH (ref 11.5–15.5)

## 2011-01-03 LAB — RETICULOCYTES
RBC.: 1.95 MIL/uL — ABNORMAL LOW (ref 3.87–5.11)
Retic Count, Absolute: 150.2 10*3/uL (ref 19.0–186.0)
Retic Ct Pct: 7.7 % — ABNORMAL HIGH (ref 0.4–3.1)

## 2011-01-03 LAB — COMPREHENSIVE METABOLIC PANEL
AST: 41 U/L — ABNORMAL HIGH (ref 0–37)
CO2: 25 mEq/L (ref 19–32)
Calcium: 8.9 mg/dL (ref 8.4–10.5)
Creatinine, Ser: 0.89 mg/dL (ref 0.4–1.2)
GFR calc Af Amer: >60 mL/min (ref >60)
GFR calc non Af Amer: >60 mL/min (ref >60)

## 2011-01-03 LAB — METHYLMALONIC ACID, SERUM: Methylmalonic Acid, Quantitative: 134 nmol/L (ref 87–318)

## 2011-01-03 LAB — CROSSMATCH
ABO/RH(D): O POS
Antibody Screen: NEGATIVE

## 2011-01-03 LAB — IRON AND TIBC
Saturation Ratios: 7 % — ABNORMAL LOW (ref 20–55)
UIBC: 318 ug/dL

## 2011-01-03 LAB — ABO/RH: ABO/RH(D): O POS

## 2011-01-03 LAB — VITAMIN B12: Vitamin B-12: 280 pg/mL (ref 211–911)

## 2011-01-03 LAB — TSH: TSH: 1.077 u[IU]/mL (ref 0.350–4.500)

## 2011-01-03 LAB — T4, FREE: Free T4: 1.26 ng/dL (ref 0.80–1.80)

## 2011-01-03 LAB — HEMOCCULT GUIAC POC 1CARD (OFFICE): Fecal Occult Bld: NEGATIVE

## 2011-02-10 ENCOUNTER — Other Ambulatory Visit: Payer: Self-pay | Admitting: Hematology & Oncology

## 2011-02-10 ENCOUNTER — Encounter (HOSPITAL_BASED_OUTPATIENT_CLINIC_OR_DEPARTMENT_OTHER): Payer: Commercial Managed Care - PPO | Admitting: Hematology & Oncology

## 2011-02-10 DIAGNOSIS — Z86718 Personal history of other venous thrombosis and embolism: Secondary | ICD-10-CM

## 2011-02-10 DIAGNOSIS — Z7901 Long term (current) use of anticoagulants: Secondary | ICD-10-CM

## 2011-02-10 DIAGNOSIS — D693 Immune thrombocytopenic purpura: Secondary | ICD-10-CM

## 2011-02-10 DIAGNOSIS — I82819 Embolism and thrombosis of superficial veins of unspecified lower extremities: Secondary | ICD-10-CM

## 2011-02-10 LAB — CBC WITH DIFFERENTIAL (CANCER CENTER ONLY)
BASO%: 0.6 % (ref 0.0–2.0)
Eosinophils Absolute: 0.5 10*3/uL (ref 0.0–0.5)
MONO#: 1 10*3/uL — ABNORMAL HIGH (ref 0.1–0.9)
MONO%: 9.4 % (ref 0.0–13.0)
NEUT#: 5.8 10*3/uL (ref 1.5–6.5)
Platelets: 72 10*3/uL — ABNORMAL LOW (ref 145–400)
RBC: 3.85 10*6/uL (ref 3.70–5.32)
RDW: 12.6 % (ref 11.1–15.7)
WBC: 10.8 10*3/uL — ABNORMAL HIGH (ref 3.9–10.0)

## 2011-02-10 LAB — CHCC SATELLITE - SMEAR

## 2011-02-11 LAB — FERRITIN: Ferritin: 88 ng/mL (ref 10–291)

## 2011-02-11 LAB — RETICULOCYTES (CHCC): Retic Ct Pct: 1.3 % (ref 0.4–3.1)

## 2011-02-17 ENCOUNTER — Other Ambulatory Visit: Payer: Self-pay | Admitting: Hematology & Oncology

## 2011-02-17 ENCOUNTER — Encounter (HOSPITAL_BASED_OUTPATIENT_CLINIC_OR_DEPARTMENT_OTHER): Payer: Commercial Managed Care - PPO | Admitting: Oncology

## 2011-02-17 DIAGNOSIS — I82819 Embolism and thrombosis of superficial veins of unspecified lower extremities: Secondary | ICD-10-CM

## 2011-02-17 DIAGNOSIS — D693 Immune thrombocytopenic purpura: Secondary | ICD-10-CM

## 2011-02-17 DIAGNOSIS — Z7901 Long term (current) use of anticoagulants: Secondary | ICD-10-CM

## 2011-02-17 LAB — CBC WITH DIFFERENTIAL/PLATELET
Basophils Absolute: 0.1 10*3/uL (ref 0.0–0.1)
EOS%: 2.2 % (ref 0.0–7.0)
Eosinophils Absolute: 0.4 10*3/uL (ref 0.0–0.5)
HCT: 40.6 % (ref 34.8–46.6)
HGB: 13.6 g/dL (ref 11.6–15.9)
MCH: 32.3 pg (ref 25.1–34.0)
MONO#: 0.4 10*3/uL (ref 0.1–0.9)
NEUT#: 14.5 10*3/uL — ABNORMAL HIGH (ref 1.5–6.5)
NEUT%: 71.9 % (ref 38.4–76.8)
RDW: 13.9 % (ref 11.2–14.5)
lymph#: 4.8 10*3/uL — ABNORMAL HIGH (ref 0.9–3.3)

## 2011-02-17 LAB — CHCC SMEAR

## 2011-02-24 ENCOUNTER — Other Ambulatory Visit: Payer: Self-pay | Admitting: Hematology & Oncology

## 2011-02-24 ENCOUNTER — Encounter (HOSPITAL_BASED_OUTPATIENT_CLINIC_OR_DEPARTMENT_OTHER): Payer: Commercial Managed Care - PPO | Admitting: Oncology

## 2011-02-24 DIAGNOSIS — I82819 Embolism and thrombosis of superficial veins of unspecified lower extremities: Secondary | ICD-10-CM

## 2011-02-24 LAB — CBC WITH DIFFERENTIAL/PLATELET
BASO%: 0.4 % (ref 0.0–2.0)
HCT: 37.2 % (ref 34.8–46.6)
LYMPH%: 21.4 % (ref 14.0–49.7)
MCHC: 34.4 g/dL (ref 31.5–36.0)
MCV: 91.4 fL (ref 79.5–101.0)
MONO%: 6.7 % (ref 0.0–14.0)
NEUT%: 69.7 % (ref 38.4–76.8)
Platelets: 223 10*3/uL (ref 145–400)
RBC: 4.07 10*6/uL (ref 3.70–5.45)
nRBC: 0 % (ref 0–0)

## 2011-03-03 ENCOUNTER — Other Ambulatory Visit: Payer: Self-pay | Admitting: Hematology & Oncology

## 2011-03-03 ENCOUNTER — Encounter (HOSPITAL_BASED_OUTPATIENT_CLINIC_OR_DEPARTMENT_OTHER): Payer: Commercial Managed Care - PPO | Admitting: Hematology & Oncology

## 2011-03-03 DIAGNOSIS — I82819 Embolism and thrombosis of superficial veins of unspecified lower extremities: Secondary | ICD-10-CM

## 2011-03-03 DIAGNOSIS — Z7901 Long term (current) use of anticoagulants: Secondary | ICD-10-CM

## 2011-03-03 DIAGNOSIS — D693 Immune thrombocytopenic purpura: Secondary | ICD-10-CM

## 2011-03-03 LAB — CBC WITH DIFFERENTIAL (CANCER CENTER ONLY)
BASO#: 0.1 10*3/uL (ref 0.0–0.2)
EOS%: 0 % (ref 0.0–7.0)
Eosinophils Absolute: 0 10*3/uL (ref 0.0–0.5)
HGB: 13.5 g/dL (ref 11.6–15.9)
LYMPH%: 8.9 % — ABNORMAL LOW (ref 14.0–48.0)
MCH: 32 pg (ref 26.0–34.0)
MCHC: 35.3 g/dL (ref 32.0–36.0)
MCV: 91 fL (ref 81–101)
MONO%: 0.6 % (ref 0.0–13.0)
NEUT#: 6.3 10*3/uL (ref 1.5–6.5)
Platelets: 29 10*3/uL — ABNORMAL LOW (ref 145–400)
RBC: 4.22 10*6/uL (ref 3.70–5.32)

## 2011-03-03 LAB — CMP (CANCER CENTER ONLY)
AST: 15 U/L (ref 11–38)
Alkaline Phosphatase: 75 U/L (ref 26–84)
BUN, Bld: 12 mg/dL (ref 7–22)
Glucose, Bld: 181 mg/dL — ABNORMAL HIGH (ref 73–118)
Potassium: 4.4 mEq/L (ref 3.3–4.7)
Total Bilirubin: 0.4 mg/dl (ref 0.20–1.60)

## 2011-03-10 ENCOUNTER — Encounter (HOSPITAL_BASED_OUTPATIENT_CLINIC_OR_DEPARTMENT_OTHER): Payer: 59 | Admitting: Oncology

## 2011-03-10 ENCOUNTER — Other Ambulatory Visit: Payer: Self-pay | Admitting: Hematology & Oncology

## 2011-03-10 DIAGNOSIS — D693 Immune thrombocytopenic purpura: Secondary | ICD-10-CM

## 2011-03-10 LAB — CBC WITH DIFFERENTIAL/PLATELET
Basophils Absolute: 0 10*3/uL (ref 0.0–0.1)
EOS%: 4.8 % (ref 0.0–7.0)
Eosinophils Absolute: 0.9 10*3/uL — ABNORMAL HIGH (ref 0.0–0.5)
HCT: 37.9 % (ref 34.8–46.6)
HGB: 13.2 g/dL (ref 11.6–15.9)
LYMPH%: 33.8 % (ref 14.0–49.7)
MCH: 31.4 pg (ref 25.1–34.0)
MCV: 90.2 fL (ref 79.5–101.0)
MONO%: 6.5 % (ref 0.0–14.0)
NEUT#: 10.2 10*3/uL — ABNORMAL HIGH (ref 1.5–6.5)
NEUT%: 54.7 % (ref 38.4–76.8)
Platelets: 510 10*3/uL — ABNORMAL HIGH (ref 145–400)

## 2011-03-10 LAB — TECHNOLOGIST REVIEW

## 2011-03-13 ENCOUNTER — Encounter: Payer: Self-pay | Admitting: Internal Medicine

## 2011-03-18 ENCOUNTER — Encounter (HOSPITAL_BASED_OUTPATIENT_CLINIC_OR_DEPARTMENT_OTHER): Payer: 59 | Admitting: Hematology & Oncology

## 2011-03-18 ENCOUNTER — Other Ambulatory Visit: Payer: Self-pay | Admitting: Hematology & Oncology

## 2011-03-18 DIAGNOSIS — I82819 Embolism and thrombosis of superficial veins of unspecified lower extremities: Secondary | ICD-10-CM

## 2011-03-18 DIAGNOSIS — D693 Immune thrombocytopenic purpura: Secondary | ICD-10-CM

## 2011-03-18 DIAGNOSIS — Z7901 Long term (current) use of anticoagulants: Secondary | ICD-10-CM

## 2011-03-18 DIAGNOSIS — Z86718 Personal history of other venous thrombosis and embolism: Secondary | ICD-10-CM

## 2011-03-18 LAB — MANUAL DIFFERENTIAL (CHCC SATELLITE)
ALC: 2.1 10*3/uL (ref 0.6–2.2)
ANC (CHCC HP manual diff): 31.8 10*3/uL — ABNORMAL HIGH (ref 1.5–6.7)
MONO: 4 % (ref 0–13)

## 2011-03-18 LAB — CBC WITH DIFFERENTIAL (CANCER CENTER ONLY)
HCT: 36.6 % (ref 34.8–46.6)
HGB: 13 g/dL (ref 11.6–15.9)
MCH: 32.3 pg (ref 26.0–34.0)
MCHC: 35.5 g/dL (ref 32.0–36.0)
RDW: 14 % (ref 11.1–15.7)

## 2011-03-18 LAB — CHCC SATELLITE - SMEAR

## 2011-03-24 ENCOUNTER — Encounter (HOSPITAL_BASED_OUTPATIENT_CLINIC_OR_DEPARTMENT_OTHER): Payer: 59 | Admitting: Oncology

## 2011-03-24 ENCOUNTER — Other Ambulatory Visit: Payer: Self-pay | Admitting: Hematology & Oncology

## 2011-03-24 DIAGNOSIS — Z5181 Encounter for therapeutic drug level monitoring: Secondary | ICD-10-CM

## 2011-03-24 LAB — CBC WITH DIFFERENTIAL/PLATELET
BASO%: 0.4 % (ref 0.0–2.0)
EOS%: 3 % (ref 0.0–7.0)
HCT: 36.4 % (ref 34.8–46.6)
MCH: 31.9 pg (ref 25.1–34.0)
MCHC: 34.3 g/dL (ref 31.5–36.0)
MONO#: 0.9 10*3/uL (ref 0.1–0.9)
RDW: 14.7 % — ABNORMAL HIGH (ref 11.2–14.5)
WBC: 13.1 10*3/uL — ABNORMAL HIGH (ref 3.9–10.3)
lymph#: 3.8 10*3/uL — ABNORMAL HIGH (ref 0.9–3.3)
nRBC: 0 % (ref 0–0)

## 2011-03-26 ENCOUNTER — Other Ambulatory Visit: Payer: Self-pay | Admitting: Hematology & Oncology

## 2011-03-26 ENCOUNTER — Encounter (HOSPITAL_BASED_OUTPATIENT_CLINIC_OR_DEPARTMENT_OTHER): Payer: 59 | Admitting: Oncology

## 2011-03-26 DIAGNOSIS — I82819 Embolism and thrombosis of superficial veins of unspecified lower extremities: Secondary | ICD-10-CM

## 2011-03-26 LAB — CBC WITH DIFFERENTIAL/PLATELET
Eosinophils Absolute: 0.4 10*3/uL (ref 0.0–0.5)
HCT: 35.8 % (ref 34.8–46.6)
HGB: 12.1 g/dL (ref 11.6–15.9)
LYMPH%: 28.3 % (ref 14.0–49.7)
MONO#: 1.1 10*3/uL — ABNORMAL HIGH (ref 0.1–0.9)
NEUT#: 6.9 10*3/uL — ABNORMAL HIGH (ref 1.5–6.5)
NEUT%: 58.3 % (ref 38.4–76.8)
Platelets: 222 10*3/uL (ref 145–400)
WBC: 11.8 10*3/uL — ABNORMAL HIGH (ref 3.9–10.3)

## 2011-03-31 ENCOUNTER — Other Ambulatory Visit: Payer: Self-pay | Admitting: Hematology & Oncology

## 2011-03-31 ENCOUNTER — Encounter (HOSPITAL_BASED_OUTPATIENT_CLINIC_OR_DEPARTMENT_OTHER): Payer: 59 | Admitting: Oncology

## 2011-03-31 DIAGNOSIS — D693 Immune thrombocytopenic purpura: Secondary | ICD-10-CM

## 2011-03-31 LAB — CBC WITH DIFFERENTIAL/PLATELET
BASO%: 0.6 % (ref 0.0–2.0)
Basophils Absolute: 0.1 10*3/uL (ref 0.0–0.1)
HCT: 34.9 % (ref 34.8–46.6)
LYMPH%: 34.7 % (ref 14.0–49.7)
MCH: 32.6 pg (ref 25.1–34.0)
MCHC: 34 g/dL (ref 31.5–36.0)
MONO#: 0.8 10*3/uL (ref 0.1–0.9)
NEUT%: 51.2 % (ref 38.4–76.8)
Platelets: 291 10*3/uL (ref 145–400)
WBC: 9.4 10*3/uL (ref 3.9–10.3)

## 2011-04-02 ENCOUNTER — Encounter (HOSPITAL_BASED_OUTPATIENT_CLINIC_OR_DEPARTMENT_OTHER): Payer: 59 | Admitting: Oncology

## 2011-04-02 ENCOUNTER — Other Ambulatory Visit: Payer: Self-pay | Admitting: Hematology & Oncology

## 2011-04-02 DIAGNOSIS — I82819 Embolism and thrombosis of superficial veins of unspecified lower extremities: Secondary | ICD-10-CM

## 2011-04-02 LAB — CBC WITH DIFFERENTIAL/PLATELET
Basophils Absolute: 0.1 10*3/uL (ref 0.0–0.1)
Eosinophils Absolute: 0.4 10*3/uL (ref 0.0–0.5)
HGB: 12 g/dL (ref 11.6–15.9)
MONO#: 0.8 10*3/uL (ref 0.1–0.9)
Platelets: 316 10*3/uL (ref 145–400)
RBC: 3.68 10*6/uL — ABNORMAL LOW (ref 3.70–5.45)
RDW: 14.2 % (ref 11.2–14.5)

## 2011-04-07 ENCOUNTER — Encounter (HOSPITAL_BASED_OUTPATIENT_CLINIC_OR_DEPARTMENT_OTHER): Payer: 59 | Admitting: Oncology

## 2011-04-07 ENCOUNTER — Other Ambulatory Visit: Payer: Self-pay | Admitting: Hematology & Oncology

## 2011-04-07 DIAGNOSIS — D509 Iron deficiency anemia, unspecified: Secondary | ICD-10-CM

## 2011-04-07 DIAGNOSIS — Z7901 Long term (current) use of anticoagulants: Secondary | ICD-10-CM

## 2011-04-07 DIAGNOSIS — I82819 Embolism and thrombosis of superficial veins of unspecified lower extremities: Secondary | ICD-10-CM

## 2011-04-07 LAB — CBC WITH DIFFERENTIAL/PLATELET
Basophils Absolute: 0 10*3/uL (ref 0.0–0.1)
Eosinophils Absolute: 0.3 10*3/uL (ref 0.0–0.5)
HCT: 37.5 % (ref 34.8–46.6)
LYMPH%: 32.9 % (ref 14.0–49.7)
MCV: 96.3 fL (ref 79.5–101.0)
MONO#: 1 10*3/uL — ABNORMAL HIGH (ref 0.1–0.9)
NEUT#: 5.4 10*3/uL (ref 1.5–6.5)
NEUT%: 54.1 % (ref 38.4–76.8)
Platelets: 560 10*3/uL — ABNORMAL HIGH (ref 145–400)
WBC: 10 10*3/uL (ref 3.9–10.3)

## 2011-04-09 ENCOUNTER — Encounter: Payer: 59 | Admitting: Oncology

## 2011-04-09 ENCOUNTER — Other Ambulatory Visit: Payer: Self-pay | Admitting: Hematology & Oncology

## 2011-04-09 DIAGNOSIS — D649 Anemia, unspecified: Secondary | ICD-10-CM

## 2011-04-09 LAB — CBC WITH DIFFERENTIAL/PLATELET
BASO%: 0.7 % (ref 0.0–2.0)
EOS%: 4.8 % (ref 0.0–7.0)
HCT: 36.2 % (ref 34.8–46.6)
LYMPH%: 40 % (ref 14.0–49.7)
MCH: 31.6 pg (ref 25.1–34.0)
MCHC: 34.3 g/dL (ref 31.5–36.0)
MONO#: 0.9 10*3/uL (ref 0.1–0.9)
MONO%: 8.6 % (ref 0.0–14.0)
NEUT%: 45.9 % (ref 38.4–76.8)
Platelets: 635 10*3/uL — ABNORMAL HIGH (ref 145–400)
RBC: 3.93 10*6/uL (ref 3.70–5.45)
WBC: 10.1 10*3/uL (ref 3.9–10.3)

## 2011-04-14 ENCOUNTER — Encounter (HOSPITAL_BASED_OUTPATIENT_CLINIC_OR_DEPARTMENT_OTHER): Payer: 59 | Admitting: Oncology

## 2011-04-14 ENCOUNTER — Other Ambulatory Visit: Payer: Self-pay | Admitting: Hematology & Oncology

## 2011-04-14 DIAGNOSIS — D649 Anemia, unspecified: Secondary | ICD-10-CM

## 2011-04-14 LAB — CBC WITH DIFFERENTIAL/PLATELET
BASO%: 1 % (ref 0.0–2.0)
EOS%: 4 % (ref 0.0–7.0)
MCH: 31.9 pg (ref 25.1–34.0)
MCHC: 34.4 g/dL (ref 31.5–36.0)
MONO#: 0.9 10*3/uL (ref 0.1–0.9)
NEUT%: 50.2 % (ref 38.4–76.8)
RBC: 4.07 10*6/uL (ref 3.70–5.45)
RDW: 14.3 % (ref 11.2–14.5)
WBC: 10.4 10*3/uL — ABNORMAL HIGH (ref 3.9–10.3)
lymph#: 3.8 10*3/uL — ABNORMAL HIGH (ref 0.9–3.3)
nRBC: 0 % (ref 0–0)

## 2011-04-16 ENCOUNTER — Encounter (HOSPITAL_BASED_OUTPATIENT_CLINIC_OR_DEPARTMENT_OTHER): Payer: 59 | Admitting: Oncology

## 2011-04-16 ENCOUNTER — Other Ambulatory Visit: Payer: Self-pay | Admitting: Hematology & Oncology

## 2011-04-16 DIAGNOSIS — I82819 Embolism and thrombosis of superficial veins of unspecified lower extremities: Secondary | ICD-10-CM

## 2011-04-16 DIAGNOSIS — D509 Iron deficiency anemia, unspecified: Secondary | ICD-10-CM

## 2011-04-16 LAB — CBC WITH DIFFERENTIAL/PLATELET
BASO%: 1.1 % (ref 0.0–2.0)
Eosinophils Absolute: 0.6 10*3/uL — ABNORMAL HIGH (ref 0.0–0.5)
LYMPH%: 38 % (ref 14.0–49.7)
MCHC: 34.1 g/dL (ref 31.5–36.0)
MONO#: 1.1 10*3/uL — ABNORMAL HIGH (ref 0.1–0.9)
MONO%: 10 % (ref 0.0–14.0)
NEUT#: 4.8 10*3/uL (ref 1.5–6.5)
Platelets: 547 10*3/uL — ABNORMAL HIGH (ref 145–400)
RBC: 4.06 10*6/uL (ref 3.70–5.45)
RDW: 14.2 % (ref 11.2–14.5)
WBC: 10.5 10*3/uL — ABNORMAL HIGH (ref 3.9–10.3)
nRBC: 0 % (ref 0–0)

## 2011-04-22 ENCOUNTER — Encounter (HOSPITAL_BASED_OUTPATIENT_CLINIC_OR_DEPARTMENT_OTHER): Payer: 59 | Admitting: Hematology & Oncology

## 2011-04-22 ENCOUNTER — Other Ambulatory Visit: Payer: Self-pay | Admitting: Hematology & Oncology

## 2011-04-22 DIAGNOSIS — D693 Immune thrombocytopenic purpura: Secondary | ICD-10-CM

## 2011-04-22 DIAGNOSIS — I82819 Embolism and thrombosis of superficial veins of unspecified lower extremities: Secondary | ICD-10-CM

## 2011-04-22 LAB — CBC WITH DIFFERENTIAL (CANCER CENTER ONLY)
EOS%: 14.3 % — ABNORMAL HIGH (ref 0.0–7.0)
LYMPH#: 3.6 10*3/uL — ABNORMAL HIGH (ref 0.9–3.3)
MCH: 32.5 pg (ref 26.0–34.0)
MCHC: 35.3 g/dL (ref 32.0–36.0)
MONO%: 9.8 % (ref 0.0–13.0)
NEUT#: 6.5 10*3/uL (ref 1.5–6.5)
Platelets: 310 10*3/uL (ref 145–400)
RBC: 3.94 10*6/uL (ref 3.70–5.32)

## 2011-04-22 LAB — CHCC SATELLITE - SMEAR

## 2011-04-28 ENCOUNTER — Encounter (HOSPITAL_BASED_OUTPATIENT_CLINIC_OR_DEPARTMENT_OTHER): Payer: 59 | Admitting: Hematology & Oncology

## 2011-04-28 ENCOUNTER — Other Ambulatory Visit: Payer: Self-pay | Admitting: Hematology & Oncology

## 2011-04-28 DIAGNOSIS — D693 Immune thrombocytopenic purpura: Secondary | ICD-10-CM

## 2011-04-28 DIAGNOSIS — I82819 Embolism and thrombosis of superficial veins of unspecified lower extremities: Secondary | ICD-10-CM

## 2011-04-28 LAB — CBC WITH DIFFERENTIAL (CANCER CENTER ONLY)
BASO#: 0.1 10*3/uL (ref 0.0–0.2)
Eosinophils Absolute: 3.1 10*3/uL — ABNORMAL HIGH (ref 0.0–0.5)
HCT: 37.3 % (ref 34.8–46.6)
HGB: 13.5 g/dL (ref 11.6–15.9)
LYMPH#: 3.9 10*3/uL — ABNORMAL HIGH (ref 0.9–3.3)
LYMPH%: 26.1 % (ref 14.0–48.0)
MCV: 91 fL (ref 81–101)
MONO#: 1.2 10*3/uL — ABNORMAL HIGH (ref 0.1–0.9)
NEUT%: 44.1 % (ref 39.6–80.0)
RBC: 4.12 10*6/uL (ref 3.70–5.32)
WBC: 14.9 10*3/uL — ABNORMAL HIGH (ref 3.9–10.0)

## 2011-04-30 ENCOUNTER — Encounter (HOSPITAL_BASED_OUTPATIENT_CLINIC_OR_DEPARTMENT_OTHER): Payer: 59 | Admitting: Oncology

## 2011-04-30 ENCOUNTER — Other Ambulatory Visit: Payer: Self-pay | Admitting: Hematology & Oncology

## 2011-04-30 DIAGNOSIS — D693 Immune thrombocytopenic purpura: Secondary | ICD-10-CM

## 2011-04-30 DIAGNOSIS — I82819 Embolism and thrombosis of superficial veins of unspecified lower extremities: Secondary | ICD-10-CM

## 2011-04-30 LAB — CBC WITH DIFFERENTIAL/PLATELET
Basophils Absolute: 0 10*3/uL (ref 0.0–0.1)
EOS%: 0 % (ref 0.0–7.0)
Eosinophils Absolute: 0 10*3/uL (ref 0.0–0.5)
HCT: 37.6 % (ref 34.8–46.6)
HGB: 12.8 g/dL (ref 11.6–15.9)
MCH: 32.6 pg (ref 25.1–34.0)
MCV: 95.8 fL (ref 79.5–101.0)
MONO%: 3.7 % (ref 0.0–14.0)
NEUT#: 29.5 10*3/uL — ABNORMAL HIGH (ref 1.5–6.5)
NEUT%: 90.4 % — ABNORMAL HIGH (ref 38.4–76.8)

## 2011-05-07 ENCOUNTER — Encounter (HOSPITAL_BASED_OUTPATIENT_CLINIC_OR_DEPARTMENT_OTHER): Payer: 59 | Admitting: Oncology

## 2011-05-07 ENCOUNTER — Other Ambulatory Visit: Payer: Self-pay | Admitting: Hematology & Oncology

## 2011-05-07 DIAGNOSIS — I82819 Embolism and thrombosis of superficial veins of unspecified lower extremities: Secondary | ICD-10-CM

## 2011-05-07 DIAGNOSIS — D693 Immune thrombocytopenic purpura: Secondary | ICD-10-CM

## 2011-05-07 LAB — CBC WITH DIFFERENTIAL/PLATELET
Eosinophils Absolute: 1.2 10*3/uL — ABNORMAL HIGH (ref 0.0–0.5)
HCT: 36.5 % (ref 34.8–46.6)
LYMPH%: 29.7 % (ref 14.0–49.7)
MCHC: 33.8 g/dL (ref 31.5–36.0)
MCV: 96.2 fL (ref 79.5–101.0)
MONO#: 1.2 10*3/uL — ABNORMAL HIGH (ref 0.1–0.9)
MONO%: 8.1 % (ref 0.0–14.0)
NEUT#: 8.1 10*3/uL — ABNORMAL HIGH (ref 1.5–6.5)
NEUT%: 53.8 % (ref 38.4–76.8)
Platelets: 283 10*3/uL (ref 145–400)
RBC: 3.79 10*6/uL (ref 3.70–5.45)
WBC: 15 10*3/uL — ABNORMAL HIGH (ref 3.9–10.3)

## 2011-05-13 ENCOUNTER — Encounter (HOSPITAL_BASED_OUTPATIENT_CLINIC_OR_DEPARTMENT_OTHER): Payer: 59 | Admitting: Hematology & Oncology

## 2011-05-13 ENCOUNTER — Other Ambulatory Visit: Payer: Self-pay | Admitting: Hematology & Oncology

## 2011-05-13 DIAGNOSIS — I82819 Embolism and thrombosis of superficial veins of unspecified lower extremities: Secondary | ICD-10-CM

## 2011-05-13 DIAGNOSIS — D693 Immune thrombocytopenic purpura: Secondary | ICD-10-CM

## 2011-05-13 LAB — CBC WITH DIFFERENTIAL (CANCER CENTER ONLY)
BASO%: 1.3 % (ref 0.0–2.0)
EOS%: 6.1 % (ref 0.0–7.0)
Eosinophils Absolute: 0.9 10*3/uL — ABNORMAL HIGH (ref 0.0–0.5)
LYMPH%: 19.7 % (ref 14.0–48.0)
MCH: 33.2 pg (ref 26.0–34.0)
MCHC: 35.6 g/dL (ref 32.0–36.0)
MCV: 93 fL (ref 81–101)
MONO%: 11.7 % (ref 0.0–13.0)
Platelets: 506 10*3/uL — ABNORMAL HIGH (ref 145–400)
RDW: 14.2 % (ref 11.1–15.7)

## 2011-05-13 LAB — CHCC SATELLITE - SMEAR

## 2011-05-19 ENCOUNTER — Encounter (HOSPITAL_BASED_OUTPATIENT_CLINIC_OR_DEPARTMENT_OTHER): Payer: 59 | Admitting: Oncology

## 2011-05-19 ENCOUNTER — Other Ambulatory Visit: Payer: Self-pay | Admitting: Hematology & Oncology

## 2011-05-19 DIAGNOSIS — Z86718 Personal history of other venous thrombosis and embolism: Secondary | ICD-10-CM

## 2011-05-19 LAB — CBC WITH DIFFERENTIAL/PLATELET
Basophils Absolute: 0.1 10*3/uL (ref 0.0–0.1)
EOS%: 7.5 % — ABNORMAL HIGH (ref 0.0–7.0)
HGB: 12.8 g/dL (ref 11.6–15.9)
MCH: 31.7 pg (ref 25.1–34.0)
MCV: 93.8 fL (ref 79.5–101.0)
MONO%: 9.4 % (ref 0.0–14.0)
RDW: 14 % (ref 11.2–14.5)

## 2011-05-26 ENCOUNTER — Other Ambulatory Visit: Payer: Self-pay | Admitting: Hematology & Oncology

## 2011-05-26 ENCOUNTER — Encounter (HOSPITAL_BASED_OUTPATIENT_CLINIC_OR_DEPARTMENT_OTHER): Payer: 59 | Admitting: Oncology

## 2011-05-26 DIAGNOSIS — I82819 Embolism and thrombosis of superficial veins of unspecified lower extremities: Secondary | ICD-10-CM

## 2011-05-26 DIAGNOSIS — D693 Immune thrombocytopenic purpura: Secondary | ICD-10-CM

## 2011-05-26 LAB — CBC WITH DIFFERENTIAL/PLATELET
BASO%: 0.7 % (ref 0.0–2.0)
EOS%: 7.8 % — ABNORMAL HIGH (ref 0.0–7.0)
LYMPH%: 33.6 % (ref 14.0–49.7)
MCHC: 34 g/dL (ref 31.5–36.0)
MCV: 95.6 fL (ref 79.5–101.0)
MONO#: 1 10*3/uL — ABNORMAL HIGH (ref 0.1–0.9)
MONO%: 9.6 % (ref 0.0–14.0)
Platelets: 884 10*3/uL — ABNORMAL HIGH (ref 145–400)
RBC: 4.06 10*6/uL (ref 3.70–5.45)
WBC: 10.6 10*3/uL — ABNORMAL HIGH (ref 3.9–10.3)
nRBC: 0 % (ref 0–0)

## 2011-06-02 ENCOUNTER — Other Ambulatory Visit: Payer: Self-pay | Admitting: Hematology & Oncology

## 2011-06-02 ENCOUNTER — Encounter (HOSPITAL_BASED_OUTPATIENT_CLINIC_OR_DEPARTMENT_OTHER): Payer: 59 | Admitting: Oncology

## 2011-06-02 DIAGNOSIS — I82819 Embolism and thrombosis of superficial veins of unspecified lower extremities: Secondary | ICD-10-CM

## 2011-06-02 DIAGNOSIS — D693 Immune thrombocytopenic purpura: Secondary | ICD-10-CM

## 2011-06-02 LAB — CBC WITH DIFFERENTIAL/PLATELET
BASO%: 1.2 % (ref 0.0–2.0)
EOS%: 2.9 % (ref 0.0–7.0)
MCH: 33.1 pg (ref 25.1–34.0)
MCHC: 34.3 g/dL (ref 31.5–36.0)
RDW: 14.1 % (ref 11.2–14.5)
lymph#: 3.1 10*3/uL (ref 0.9–3.3)

## 2011-06-04 ENCOUNTER — Other Ambulatory Visit: Payer: Self-pay | Admitting: Hematology & Oncology

## 2011-06-04 DIAGNOSIS — D693 Immune thrombocytopenic purpura: Secondary | ICD-10-CM

## 2011-06-05 ENCOUNTER — Ambulatory Visit (HOSPITAL_COMMUNITY)
Admission: RE | Admit: 2011-06-05 | Discharge: 2011-06-05 | Disposition: A | Discharge status: Home / Self Care | Payer: 59 | Source: Ambulatory Visit | Attending: Hematology & Oncology | Admitting: Hematology & Oncology

## 2011-06-05 DIAGNOSIS — J320 Chronic maxillary sinusitis: Secondary | ICD-10-CM | POA: Insufficient documentation

## 2011-06-05 DIAGNOSIS — R209 Unspecified disturbances of skin sensation: Secondary | ICD-10-CM | POA: Insufficient documentation

## 2011-06-05 DIAGNOSIS — D693 Immune thrombocytopenic purpura: Secondary | ICD-10-CM | POA: Insufficient documentation

## 2011-06-05 DIAGNOSIS — R42 Dizziness and giddiness: Secondary | ICD-10-CM | POA: Insufficient documentation

## 2011-06-05 LAB — CREATININE, SERUM
GFR calc Af Amer: >60 mL/min (ref >60)
GFR calc non Af Amer: >60 mL/min (ref >60)

## 2011-06-05 MED ORDER — GADOBENATE DIMEGLUMINE 529 MG/ML IV SOLN
18.0000 mL | Freq: Once | INTRAVENOUS | Status: AC | PRN
  Administered 2011-06-05: 18 mL via INTRAVENOUS

## 2011-06-09 ENCOUNTER — Encounter (HOSPITAL_BASED_OUTPATIENT_CLINIC_OR_DEPARTMENT_OTHER): Payer: 59 | Admitting: Hematology & Oncology

## 2011-06-09 ENCOUNTER — Other Ambulatory Visit: Payer: Self-pay | Admitting: Hematology & Oncology

## 2011-06-09 DIAGNOSIS — D693 Immune thrombocytopenic purpura: Secondary | ICD-10-CM

## 2011-06-09 DIAGNOSIS — I82819 Embolism and thrombosis of superficial veins of unspecified lower extremities: Secondary | ICD-10-CM

## 2011-06-09 LAB — COMPREHENSIVE METABOLIC PANEL
AST: 15 U/L (ref 0–37)
Albumin: 3.7 g/dL (ref 3.5–5.2)
BUN: 8 mg/dL (ref 6–23)
Calcium: 9.1 mg/dL (ref 8.4–10.5)
Chloride: 101 mEq/L (ref 96–112)
Glucose, Bld: 103 mg/dL — ABNORMAL HIGH (ref 70–99)
Potassium: 3.7 mEq/L (ref 3.5–5.3)

## 2011-06-09 LAB — CBC WITH DIFFERENTIAL (CANCER CENTER ONLY)
BASO#: 0.1 10*3/uL (ref 0.0–0.2)
Eosinophils Absolute: 1.7 10*3/uL — ABNORMAL HIGH (ref 0.0–0.5)
HCT: 36.5 % (ref 34.8–46.6)
HGB: 13.1 g/dL (ref 11.6–15.9)
LYMPH#: 3.9 10*3/uL — ABNORMAL HIGH (ref 0.9–3.3)
MONO#: 1.3 10*3/uL — ABNORMAL HIGH (ref 0.1–0.9)
NEUT%: 53.9 % (ref 39.6–80.0)
WBC: 15.2 10*3/uL — ABNORMAL HIGH (ref 3.9–10.0)

## 2011-06-24 ENCOUNTER — Encounter (HOSPITAL_BASED_OUTPATIENT_CLINIC_OR_DEPARTMENT_OTHER): Payer: 59 | Admitting: Oncology

## 2011-06-24 ENCOUNTER — Other Ambulatory Visit: Payer: Self-pay | Admitting: Hematology & Oncology

## 2011-06-24 DIAGNOSIS — D693 Immune thrombocytopenic purpura: Secondary | ICD-10-CM

## 2011-06-24 DIAGNOSIS — I82819 Embolism and thrombosis of superficial veins of unspecified lower extremities: Secondary | ICD-10-CM

## 2011-06-24 LAB — CBC WITH DIFFERENTIAL/PLATELET
BASO%: 0.1 % (ref 0.0–2.0)
Basophils Absolute: 0 10*3/uL (ref 0.0–0.1)
EOS%: 5.7 % (ref 0.0–7.0)
Eosinophils Absolute: 0.5 10*3/uL (ref 0.0–0.5)
HCT: 36.5 % (ref 34.8–46.6)
HGB: 12.5 g/dL (ref 11.6–15.9)
LYMPH%: 27.5 % (ref 14.0–49.7)
MCH: 32.5 pg (ref 25.1–34.0)
MCHC: 34.3 g/dL (ref 31.5–36.0)
MCV: 94.5 fL (ref 79.5–101.0)
MONO#: 0.8 10*3/uL (ref 0.1–0.9)
MONO%: 8.6 % (ref 0.0–14.0)
NEUT#: 5.2 10*3/uL (ref 1.5–6.5)
NEUT%: 58.1 % (ref 38.4–76.8)
Platelets: 500 10*3/uL — ABNORMAL HIGH (ref 145–400)
RBC: 3.86 10*6/uL (ref 3.70–5.45)
RDW: 13.8 % (ref 11.2–14.5)
WBC: 8.9 10*3/uL (ref 3.9–10.3)
lymph#: 2.4 10*3/uL (ref 0.9–3.3)

## 2011-06-30 ENCOUNTER — Other Ambulatory Visit: Payer: Self-pay | Admitting: Hematology & Oncology

## 2011-06-30 ENCOUNTER — Encounter (HOSPITAL_BASED_OUTPATIENT_CLINIC_OR_DEPARTMENT_OTHER): Payer: 59 | Admitting: Oncology

## 2011-06-30 DIAGNOSIS — D649 Anemia, unspecified: Secondary | ICD-10-CM

## 2011-06-30 DIAGNOSIS — I82819 Embolism and thrombosis of superficial veins of unspecified lower extremities: Secondary | ICD-10-CM

## 2011-06-30 LAB — CBC WITH DIFFERENTIAL/PLATELET
Basophils Absolute: 0.1 10*3/uL (ref 0.0–0.1)
Eosinophils Absolute: 0.7 10*3/uL — ABNORMAL HIGH (ref 0.0–0.5)
HGB: 12.9 g/dL (ref 11.6–15.9)
MCV: 95.5 fL (ref 79.5–101.0)
MONO#: 0.7 10*3/uL (ref 0.1–0.9)
MONO%: 6.5 % (ref 0.0–14.0)
NEUT#: 6.7 10*3/uL — ABNORMAL HIGH (ref 1.5–6.5)
RDW: 14.1 % (ref 11.2–14.5)
WBC: 11.5 10*3/uL — ABNORMAL HIGH (ref 3.9–10.3)
lymph#: 3.4 10*3/uL — ABNORMAL HIGH (ref 0.9–3.3)

## 2011-07-07 ENCOUNTER — Encounter (HOSPITAL_BASED_OUTPATIENT_CLINIC_OR_DEPARTMENT_OTHER): Payer: 59 | Admitting: Oncology

## 2011-07-07 ENCOUNTER — Other Ambulatory Visit: Payer: Self-pay | Admitting: Hematology & Oncology

## 2011-07-07 DIAGNOSIS — D693 Immune thrombocytopenic purpura: Secondary | ICD-10-CM

## 2011-07-07 DIAGNOSIS — I82819 Embolism and thrombosis of superficial veins of unspecified lower extremities: Secondary | ICD-10-CM

## 2011-07-07 LAB — CBC WITH DIFFERENTIAL/PLATELET
Basophils Absolute: 0.1 10*3/uL (ref 0.0–0.1)
Eosinophils Absolute: 0.5 10*3/uL (ref 0.0–0.5)
HGB: 13.8 g/dL (ref 11.6–15.9)
LYMPH%: 27 % (ref 14.0–49.7)
MCV: 95 fL (ref 79.5–101.0)
MONO%: 7.9 % (ref 0.0–14.0)
NEUT#: 6.3 10*3/uL (ref 1.5–6.5)
NEUT%: 59.8 % (ref 38.4–76.8)
Platelets: 556 10*3/uL — ABNORMAL HIGH (ref 145–400)
RBC: 4.27 10*6/uL (ref 3.70–5.45)

## 2011-07-08 ENCOUNTER — Other Ambulatory Visit: Payer: Self-pay | Admitting: Internal Medicine

## 2011-07-08 NOTE — Telephone Encounter (Signed)
One month done per emr; needs rov with Dr Felicity Coyer

## 2011-07-09 ENCOUNTER — Other Ambulatory Visit: Payer: Self-pay | Admitting: Internal Medicine

## 2011-07-10 ENCOUNTER — Other Ambulatory Visit: Payer: Self-pay | Admitting: Internal Medicine

## 2011-07-14 ENCOUNTER — Encounter (HOSPITAL_BASED_OUTPATIENT_CLINIC_OR_DEPARTMENT_OTHER): Payer: 59 | Admitting: Hematology & Oncology

## 2011-07-14 ENCOUNTER — Other Ambulatory Visit: Payer: Self-pay | Admitting: Hematology & Oncology

## 2011-07-14 DIAGNOSIS — D693 Immune thrombocytopenic purpura: Secondary | ICD-10-CM

## 2011-07-14 LAB — CBC WITH DIFFERENTIAL (CANCER CENTER ONLY)
BASO%: 1 % (ref 0.0–2.0)
EOS%: 12.1 % — ABNORMAL HIGH (ref 0.0–7.0)
HCT: 38.4 % (ref 34.8–46.6)
LYMPH%: 25.6 % (ref 14.0–48.0)
MCH: 32.5 pg (ref 26.0–34.0)
MCHC: 35.9 g/dL (ref 32.0–36.0)
MCV: 90 fL (ref 81–101)
NEUT%: 51.5 % (ref 39.6–80.0)
RDW: 13.1 % (ref 11.1–15.7)

## 2011-07-14 LAB — COMPREHENSIVE METABOLIC PANEL
ALT: 19 U/L (ref 0–35)
AST: 17 U/L (ref 0–37)
BUN: 12 mg/dL (ref 6–23)
Creatinine, Ser: 0.99 mg/dL (ref 0.50–1.10)
Total Bilirubin: 0.3 mg/dL (ref 0.3–1.2)

## 2011-07-21 ENCOUNTER — Other Ambulatory Visit: Payer: Self-pay | Admitting: Hematology & Oncology

## 2011-07-21 ENCOUNTER — Encounter (HOSPITAL_BASED_OUTPATIENT_CLINIC_OR_DEPARTMENT_OTHER): Payer: 59 | Admitting: Oncology

## 2011-07-21 DIAGNOSIS — I82819 Embolism and thrombosis of superficial veins of unspecified lower extremities: Secondary | ICD-10-CM

## 2011-07-21 DIAGNOSIS — Z5181 Encounter for therapeutic drug level monitoring: Secondary | ICD-10-CM

## 2011-07-21 LAB — CBC WITH DIFFERENTIAL/PLATELET
Basophils Absolute: 0.1 10*3/uL (ref 0.0–0.1)
Eosinophils Absolute: 1 10*3/uL — ABNORMAL HIGH (ref 0.0–0.5)
HGB: 13.5 g/dL (ref 11.6–15.9)
LYMPH%: 29.1 % (ref 14.0–49.7)
MCV: 94.8 fL (ref 79.5–101.0)
MONO#: 0.7 10*3/uL (ref 0.1–0.9)
MONO%: 6 % (ref 0.0–14.0)
NEUT#: 6 10*3/uL (ref 1.5–6.5)
Platelets: 260 10*3/uL (ref 145–400)
RDW: 14 % (ref 11.2–14.5)
WBC: 10.9 10*3/uL — ABNORMAL HIGH (ref 3.9–10.3)

## 2011-07-28 ENCOUNTER — Encounter (HOSPITAL_BASED_OUTPATIENT_CLINIC_OR_DEPARTMENT_OTHER): Payer: 59 | Admitting: Oncology

## 2011-07-28 ENCOUNTER — Other Ambulatory Visit: Payer: Self-pay | Admitting: Hematology & Oncology

## 2011-07-28 DIAGNOSIS — I82819 Embolism and thrombosis of superficial veins of unspecified lower extremities: Secondary | ICD-10-CM

## 2011-07-28 DIAGNOSIS — D693 Immune thrombocytopenic purpura: Secondary | ICD-10-CM

## 2011-07-28 LAB — CBC WITH DIFFERENTIAL/PLATELET
Basophils Absolute: 0.1 10*3/uL (ref 0.0–0.1)
Eosinophils Absolute: 1.3 10*3/uL — ABNORMAL HIGH (ref 0.0–0.5)
HCT: 40.8 % (ref 34.8–46.6)
LYMPH%: 25.1 % (ref 14.0–49.7)
MCV: 94.4 fL (ref 79.5–101.0)
MONO#: 1 10*3/uL — ABNORMAL HIGH (ref 0.1–0.9)
MONO%: 7.6 % (ref 0.0–14.0)
NEUT#: 7.5 10*3/uL — ABNORMAL HIGH (ref 1.5–6.5)
NEUT%: 56.9 % (ref 38.4–76.8)
Platelets: 371 10*3/uL (ref 145–400)
RBC: 4.32 10*6/uL (ref 3.70–5.45)

## 2011-08-04 ENCOUNTER — Encounter (HOSPITAL_BASED_OUTPATIENT_CLINIC_OR_DEPARTMENT_OTHER): Payer: 59 | Admitting: Oncology

## 2011-08-04 ENCOUNTER — Other Ambulatory Visit: Payer: Self-pay | Admitting: Hematology & Oncology

## 2011-08-04 DIAGNOSIS — D693 Immune thrombocytopenic purpura: Secondary | ICD-10-CM

## 2011-08-04 DIAGNOSIS — I82819 Embolism and thrombosis of superficial veins of unspecified lower extremities: Secondary | ICD-10-CM

## 2011-08-04 LAB — CBC WITH DIFFERENTIAL/PLATELET
BASO%: 0.9 % (ref 0.0–2.0)
EOS%: 9.8 % — ABNORMAL HIGH (ref 0.0–7.0)
HCT: 39.4 % (ref 34.8–46.6)
LYMPH%: 34.1 % (ref 14.0–49.7)
MCH: 32 pg (ref 25.1–34.0)
MCHC: 33.9 g/dL (ref 31.5–36.0)
MCV: 94.3 fL (ref 79.5–101.0)
MONO%: 7.6 % (ref 0.0–14.0)
NEUT%: 47.6 % (ref 38.4–76.8)
Platelets: 311 10*3/uL (ref 145–400)
RBC: 4.18 10*6/uL (ref 3.70–5.45)
WBC: 9 10*3/uL (ref 3.9–10.3)

## 2011-08-11 ENCOUNTER — Encounter (HOSPITAL_BASED_OUTPATIENT_CLINIC_OR_DEPARTMENT_OTHER): Payer: 59 | Admitting: Oncology

## 2011-08-11 ENCOUNTER — Other Ambulatory Visit: Payer: Self-pay | Admitting: Hematology & Oncology

## 2011-08-11 DIAGNOSIS — D693 Immune thrombocytopenic purpura: Secondary | ICD-10-CM

## 2011-08-11 DIAGNOSIS — I82819 Embolism and thrombosis of superficial veins of unspecified lower extremities: Secondary | ICD-10-CM

## 2011-08-11 LAB — CBC WITH DIFFERENTIAL/PLATELET
BASO%: 0.5 % (ref 0.0–2.0)
EOS%: 10.2 % — ABNORMAL HIGH (ref 0.0–7.0)
HCT: 40.5 % (ref 34.8–46.6)
MCH: 31.6 pg (ref 25.1–34.0)
MCHC: 33.4 g/dL (ref 31.5–36.0)
MONO#: 0.8 10*3/uL (ref 0.1–0.9)
NEUT%: 49.6 % (ref 38.4–76.8)
RBC: 4.28 10*6/uL (ref 3.70–5.45)
WBC: 10.7 10*3/uL — ABNORMAL HIGH (ref 3.9–10.3)
lymph#: 3.5 10*3/uL — ABNORMAL HIGH (ref 0.9–3.3)

## 2011-08-18 ENCOUNTER — Encounter (HOSPITAL_BASED_OUTPATIENT_CLINIC_OR_DEPARTMENT_OTHER): Payer: 59 | Admitting: Hematology & Oncology

## 2011-08-18 ENCOUNTER — Other Ambulatory Visit: Payer: Self-pay | Admitting: Hematology & Oncology

## 2011-08-18 DIAGNOSIS — I82819 Embolism and thrombosis of superficial veins of unspecified lower extremities: Secondary | ICD-10-CM

## 2011-08-18 DIAGNOSIS — D693 Immune thrombocytopenic purpura: Secondary | ICD-10-CM

## 2011-08-18 LAB — COMPREHENSIVE METABOLIC PANEL
BUN: 20 mg/dL (ref 6–23)
CO2: 28 mEq/L (ref 19–32)
Calcium: 9.7 mg/dL (ref 8.4–10.5)
Chloride: 100 mEq/L (ref 96–112)
Creatinine, Ser: 1.06 mg/dL (ref 0.50–1.10)
Glucose, Bld: 87 mg/dL (ref 70–99)

## 2011-08-18 LAB — CBC WITH DIFFERENTIAL (CANCER CENTER ONLY)
BASO#: 0.1 10*3/uL (ref 0.0–0.2)
BASO%: 0.9 % (ref 0.0–2.0)
EOS%: 10.2 % — ABNORMAL HIGH (ref 0.0–7.0)
HCT: 38.9 % (ref 34.8–46.6)
HGB: 14.1 g/dL (ref 11.6–15.9)
LYMPH#: 3.2 10*3/uL (ref 0.9–3.3)
MCHC: 36.2 g/dL — ABNORMAL HIGH (ref 32.0–36.0)
MONO#: 0.8 10*3/uL (ref 0.1–0.9)
NEUT#: 5.5 10*3/uL (ref 1.5–6.5)
RDW: 13.6 % (ref 11.1–15.7)
WBC: 10.7 10*3/uL — ABNORMAL HIGH (ref 3.9–10.0)

## 2011-08-25 ENCOUNTER — Other Ambulatory Visit: Payer: Self-pay | Admitting: Hematology & Oncology

## 2011-08-25 ENCOUNTER — Other Ambulatory Visit (HOSPITAL_BASED_OUTPATIENT_CLINIC_OR_DEPARTMENT_OTHER): Payer: 59 | Admitting: Lab

## 2011-08-25 ENCOUNTER — Telehealth: Payer: Self-pay | Admitting: Hematology & Oncology

## 2011-08-25 DIAGNOSIS — D693 Immune thrombocytopenic purpura: Secondary | ICD-10-CM

## 2011-08-25 DIAGNOSIS — I82819 Embolism and thrombosis of superficial veins of unspecified lower extremities: Secondary | ICD-10-CM

## 2011-08-25 LAB — CBC WITH DIFFERENTIAL/PLATELET
Basophils Absolute: 0.1 10*3/uL (ref 0.0–0.1)
HCT: 38.4 % (ref 34.8–46.6)
HGB: 13.4 g/dL (ref 11.6–15.9)
MONO#: 0.6 10*3/uL (ref 0.1–0.9)
NEUT#: 5.2 10*3/uL (ref 1.5–6.5)
NEUT%: 48 % (ref 38.4–76.8)
WBC: 10.7 10*3/uL — ABNORMAL HIGH (ref 3.9–10.3)
lymph#: 3.7 10*3/uL — ABNORMAL HIGH (ref 0.9–3.3)

## 2011-08-25 NOTE — Telephone Encounter (Signed)
I spoke to Janice Martin and told that the plt ct was 17K.  We need to have her take Promacta qd.  Also, she will take Decadron 20mg  qd x 5d, then 12mg  qd x 3d, then 4mg  qd x 2d.  She undersatnnds this.  Will have CBC done next week.   [name] E.

## 2011-09-01 ENCOUNTER — Other Ambulatory Visit (HOSPITAL_BASED_OUTPATIENT_CLINIC_OR_DEPARTMENT_OTHER): Payer: 59 | Admitting: Lab

## 2011-09-01 ENCOUNTER — Other Ambulatory Visit: Payer: Self-pay | Admitting: Hematology & Oncology

## 2011-09-01 DIAGNOSIS — I82819 Embolism and thrombosis of superficial veins of unspecified lower extremities: Secondary | ICD-10-CM

## 2011-09-01 DIAGNOSIS — D693 Immune thrombocytopenic purpura: Secondary | ICD-10-CM

## 2011-09-01 LAB — CBC WITH DIFFERENTIAL/PLATELET
Basophils Absolute: 0.1 10*3/uL (ref 0.0–0.1)
Eosinophils Absolute: 1 10*3/uL — ABNORMAL HIGH (ref 0.0–0.5)
HGB: 13.6 g/dL (ref 11.6–15.9)
LYMPH%: 33.6 % (ref 14.0–49.7)
MCV: 89.2 fL (ref 79.5–101.0)
MONO#: 1.4 10*3/uL — ABNORMAL HIGH (ref 0.1–0.9)
MONO%: 6.4 % (ref 0.0–14.0)
NEUT#: 11.7 10*3/uL — ABNORMAL HIGH (ref 1.5–6.5)
Platelets: 506 10*3/uL — ABNORMAL HIGH (ref 145–400)
RDW: 14.4 % (ref 11.2–14.5)
WBC: 21.3 10*3/uL — ABNORMAL HIGH (ref 3.9–10.3)

## 2011-09-08 ENCOUNTER — Other Ambulatory Visit (HOSPITAL_BASED_OUTPATIENT_CLINIC_OR_DEPARTMENT_OTHER): Payer: 59 | Admitting: Lab

## 2011-09-08 ENCOUNTER — Other Ambulatory Visit: Payer: Self-pay | Admitting: Hematology & Oncology

## 2011-09-08 DIAGNOSIS — D693 Immune thrombocytopenic purpura: Secondary | ICD-10-CM

## 2011-09-08 DIAGNOSIS — I82819 Embolism and thrombosis of superficial veins of unspecified lower extremities: Secondary | ICD-10-CM

## 2011-09-08 LAB — CBC WITH DIFFERENTIAL/PLATELET
Basophils Absolute: 0 10*3/uL (ref 0.0–0.1)
EOS%: 1.8 % (ref 0.0–7.0)
Eosinophils Absolute: 0.3 10*3/uL (ref 0.0–0.5)
HGB: 13 g/dL (ref 11.6–15.9)
MCH: 31.7 pg (ref 25.1–34.0)
NEUT#: 12.6 10*3/uL — ABNORMAL HIGH (ref 1.5–6.5)
RDW: 14.9 % — ABNORMAL HIGH (ref 11.2–14.5)
lymph#: 2.9 10*3/uL (ref 0.9–3.3)

## 2011-09-09 ENCOUNTER — Encounter: Payer: Self-pay | Admitting: Hematology & Oncology

## 2011-09-15 ENCOUNTER — Other Ambulatory Visit (HOSPITAL_BASED_OUTPATIENT_CLINIC_OR_DEPARTMENT_OTHER): Payer: 59 | Admitting: Lab

## 2011-09-15 ENCOUNTER — Ambulatory Visit (HOSPITAL_BASED_OUTPATIENT_CLINIC_OR_DEPARTMENT_OTHER): Payer: 59 | Admitting: Hematology & Oncology

## 2011-09-15 ENCOUNTER — Other Ambulatory Visit: Payer: Self-pay | Admitting: Hematology & Oncology

## 2011-09-15 DIAGNOSIS — I82819 Embolism and thrombosis of superficial veins of unspecified lower extremities: Secondary | ICD-10-CM

## 2011-09-15 DIAGNOSIS — D693 Immune thrombocytopenic purpura: Secondary | ICD-10-CM

## 2011-09-15 LAB — CBC WITH DIFFERENTIAL (CANCER CENTER ONLY)
BASO#: 0.1 10*3/uL (ref 0.0–0.2)
BASO%: 1 % (ref 0.0–2.0)
EOS%: 5.6 % (ref 0.0–7.0)
Eosinophils Absolute: 0.7 10*3/uL — ABNORMAL HIGH (ref 0.0–0.5)
HCT: 38.4 % (ref 34.8–46.6)
HGB: 13.8 g/dL (ref 11.6–15.9)
LYMPH#: 2.7 10*3/uL (ref 0.9–3.3)
LYMPH%: 20.4 % (ref 14.0–48.0)
MCH: 32.1 pg (ref 26.0–34.0)
MCHC: 35.9 g/dL (ref 32.0–36.0)
MCV: 89 fL (ref 81–101)
MONO#: 1 10*3/uL — ABNORMAL HIGH (ref 0.1–0.9)
MONO%: 7.7 % (ref 0.0–13.0)
NEUT#: 8.6 10*3/uL — ABNORMAL HIGH (ref 1.5–6.5)
NEUT%: 65.3 % (ref 39.6–80.0)
Platelets: 118 10*3/uL — ABNORMAL LOW (ref 145–400)
RBC: 4.3 10*6/uL (ref 3.70–5.32)
RDW: 14.4 % (ref 11.1–15.7)
WBC: 13.1 10*3/uL — ABNORMAL HIGH (ref 3.9–10.0)

## 2011-09-15 NOTE — Progress Notes (Signed)
This office note has been dictated. CSN: [id number]

## 2011-09-16 NOTE — Progress Notes (Signed)
CC:   Valerie A. Felicity Coyer, MD  DIAGNOSIS:  Refractory immune thrombocytopenia.  CURRENT THERAPY:  Promacta 0.5 mg p.o. q. Week.  INTERIM HISTORY:  Janice Martin comes in for followup.  Her platelet count is trending down slowly but surely.  She is having no problems with Promacta.  There is no abdominal pain.  There is no nausea or vomiting. She has had no bleeding or bruising.  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  She is dealing with the new EPIC system without too many problems.  Some of the family issues that were bothering her are being resolved. __________ happy going into the holidays.  PHYSICAL EXAMINATION:  This is a well-developed, well-nourished white female in no obvious distress.  Vital signs:  Temperature 96.9, pulse 89, respiratory rate 16, blood pressure 145/73.  Weight is 185.  Head and neck:  Shows a normocephalic, atraumatic skull.  There are no ocular or oral lesions.  There are no palpable cervical, supraclavicular lymph nodes.  Lungs:  Clear bilaterally.  Cardiac:  Regular rate and rhythm with a normal S1 and S2.  There are no murmurs or rubs or bruits. Abdomen:  Soft with good bowel sounds.  She has a well-healed laparotomy wound from her splenectomy.  There is no fluid wave.  There is no palpable hepatomegaly.  Extremities:  No clubbing, cyanosis or edema. Skin:  No rashes, ecchymosis or petechia.  LABORATORY STUDIES:  White cell count 13.7, hemoglobin 13.8, platelet count 118.  IMPRESSION:  Janice Martin is a 50 year old white female with refractory immune thrombocytopenia.  When we had her on Promacta initially, her platelet count went up to 800,000.  We pulled her frequency of taking the Promacta back.  She now is on daily Promacta at a low dose.  We will continue to check her blood counts weekly.  If we find that her platelet count continues to drop, we will increase her Promacta dose.  She always responds to Decadron.  We could  certainly put her on a short course of Decadron if her platelet count drops considerably.  I will plan to see back myself in another 6 weeks.    ______________________________ Josph Macho, M.D. PRE/MEDQ  D:  09/15/2011  T:  09/16/2011  Job:  [id number]

## 2011-09-22 ENCOUNTER — Other Ambulatory Visit (HOSPITAL_BASED_OUTPATIENT_CLINIC_OR_DEPARTMENT_OTHER): Payer: 59 | Admitting: Lab

## 2011-09-22 ENCOUNTER — Other Ambulatory Visit: Payer: 59 | Admitting: Lab

## 2011-09-22 ENCOUNTER — Other Ambulatory Visit: Payer: Self-pay | Admitting: Hematology & Oncology

## 2011-09-22 DIAGNOSIS — D509 Iron deficiency anemia, unspecified: Secondary | ICD-10-CM

## 2011-09-22 DIAGNOSIS — D693 Immune thrombocytopenic purpura: Secondary | ICD-10-CM

## 2011-09-22 LAB — CBC WITH DIFFERENTIAL/PLATELET
BASO%: 0.4 % (ref 0.0–2.0)
EOS%: 5.1 % (ref 0.0–7.0)
HCT: 38.3 % (ref 34.8–46.6)
MCH: 31.1 pg (ref 25.1–34.0)
MCHC: 34.7 g/dL (ref 31.5–36.0)
NEUT%: 69.1 % (ref 38.4–76.8)
lymph#: 3.4 10*3/uL — ABNORMAL HIGH (ref 0.9–3.3)

## 2011-09-29 ENCOUNTER — Other Ambulatory Visit (HOSPITAL_BASED_OUTPATIENT_CLINIC_OR_DEPARTMENT_OTHER): Payer: 59 | Admitting: Lab

## 2011-09-29 ENCOUNTER — Other Ambulatory Visit: Payer: 59 | Admitting: Lab

## 2011-09-29 ENCOUNTER — Other Ambulatory Visit: Payer: Self-pay | Admitting: Hematology & Oncology

## 2011-09-29 DIAGNOSIS — D693 Immune thrombocytopenic purpura: Secondary | ICD-10-CM

## 2011-09-29 LAB — CBC WITH DIFFERENTIAL/PLATELET
EOS%: 5 % (ref 0.0–7.0)
Eosinophils Absolute: 0.7 10*3/uL — ABNORMAL HIGH (ref 0.0–0.5)
MCH: 31.1 pg (ref 25.1–34.0)
MCV: 90.4 fL (ref 79.5–101.0)
MONO%: 6.8 % (ref 0.0–14.0)
NEUT#: 9.6 10*3/uL — ABNORMAL HIGH (ref 1.5–6.5)
RBC: 4.25 10*6/uL (ref 3.70–5.45)
RDW: 15.1 % — ABNORMAL HIGH (ref 11.2–14.5)
nRBC: 0 % (ref 0–0)

## 2011-10-06 ENCOUNTER — Other Ambulatory Visit (HOSPITAL_BASED_OUTPATIENT_CLINIC_OR_DEPARTMENT_OTHER): Payer: 59 | Admitting: Lab

## 2011-10-06 ENCOUNTER — Other Ambulatory Visit: Payer: Self-pay | Admitting: Hematology & Oncology

## 2011-10-06 ENCOUNTER — Other Ambulatory Visit: Payer: 59 | Admitting: Lab

## 2011-10-06 DIAGNOSIS — D693 Immune thrombocytopenic purpura: Secondary | ICD-10-CM

## 2011-10-06 LAB — CBC WITH DIFFERENTIAL/PLATELET
BASO%: 0.3 % (ref 0.0–2.0)
MCHC: 34.4 g/dL (ref 31.5–36.0)
MONO#: 0.9 10*3/uL (ref 0.1–0.9)
RBC: 4.17 10*6/uL (ref 3.70–5.45)
WBC: 13.5 10*3/uL — ABNORMAL HIGH (ref 3.9–10.3)
lymph#: 2.6 10*3/uL (ref 0.9–3.3)

## 2011-10-13 ENCOUNTER — Other Ambulatory Visit (HOSPITAL_BASED_OUTPATIENT_CLINIC_OR_DEPARTMENT_OTHER): Payer: 59 | Admitting: Lab

## 2011-10-13 ENCOUNTER — Other Ambulatory Visit: Payer: 59 | Admitting: Lab

## 2011-10-13 ENCOUNTER — Other Ambulatory Visit: Payer: Self-pay | Admitting: Hematology & Oncology

## 2011-10-13 DIAGNOSIS — D693 Immune thrombocytopenic purpura: Secondary | ICD-10-CM

## 2011-10-13 LAB — CBC WITH DIFFERENTIAL/PLATELET
BASO%: 0.7 % (ref 0.0–2.0)
Basophils Absolute: 0.1 10*3/uL (ref 0.0–0.1)
HCT: 38.1 % (ref 34.8–46.6)
HGB: 13.3 g/dL (ref 11.6–15.9)
MCHC: 34.9 g/dL (ref 31.5–36.0)
MONO#: 1.1 10*3/uL — ABNORMAL HIGH (ref 0.1–0.9)
NEUT%: 55.8 % (ref 38.4–76.8)
WBC: 17.2 10*3/uL — ABNORMAL HIGH (ref 3.9–10.3)
lymph#: 4.6 10*3/uL — ABNORMAL HIGH (ref 0.9–3.3)

## 2011-10-20 ENCOUNTER — Other Ambulatory Visit: Payer: 59 | Admitting: Lab

## 2011-10-20 ENCOUNTER — Other Ambulatory Visit (HOSPITAL_BASED_OUTPATIENT_CLINIC_OR_DEPARTMENT_OTHER): Payer: 59 | Admitting: Lab

## 2011-10-20 ENCOUNTER — Other Ambulatory Visit: Payer: Self-pay | Admitting: Hematology & Oncology

## 2011-10-20 DIAGNOSIS — D693 Immune thrombocytopenic purpura: Secondary | ICD-10-CM

## 2011-10-20 LAB — CBC WITH DIFFERENTIAL/PLATELET
BASO%: 0.4 % (ref 0.0–2.0)
Eosinophils Absolute: 1.1 10*3/uL — ABNORMAL HIGH (ref 0.0–0.5)
MCHC: 34.3 g/dL (ref 31.5–36.0)
MONO#: 0.9 10*3/uL (ref 0.1–0.9)
NEUT#: 7.6 10*3/uL — ABNORMAL HIGH (ref 1.5–6.5)
RBC: 3.98 10*6/uL (ref 3.70–5.45)
WBC: 13 10*3/uL — ABNORMAL HIGH (ref 3.9–10.3)
lymph#: 3.3 10*3/uL (ref 0.9–3.3)

## 2011-10-23 ENCOUNTER — Other Ambulatory Visit: Payer: Self-pay | Admitting: Internal Medicine

## 2011-10-23 ENCOUNTER — Telehealth: Payer: Self-pay | Admitting: *Deleted

## 2011-10-23 NOTE — Telephone Encounter (Signed)
Received fax pt needing PA on Benicar. [employer] they faxed over [place]. Place on md desk for completion...10/23/11@3 :58pm/LMB

## 2011-10-27 NOTE — Telephone Encounter (Signed)
No, I will not complete the PA - patient has not been seen in his office in over one year. We need to reevaluate blood pressure control and may consider alternate therapy

## 2011-10-28 NOTE — Telephone Encounter (Signed)
Called pt no answer LMOM MD response...10/28/11@9 :01am/LMB

## 2011-10-29 ENCOUNTER — Other Ambulatory Visit: Payer: Self-pay | Admitting: Hematology & Oncology

## 2011-10-29 ENCOUNTER — Other Ambulatory Visit (HOSPITAL_BASED_OUTPATIENT_CLINIC_OR_DEPARTMENT_OTHER): Payer: 59 | Admitting: Lab

## 2011-10-29 ENCOUNTER — Ambulatory Visit (HOSPITAL_BASED_OUTPATIENT_CLINIC_OR_DEPARTMENT_OTHER): Payer: 59 | Admitting: Hematology & Oncology

## 2011-10-29 DIAGNOSIS — I82819 Embolism and thrombosis of superficial veins of unspecified lower extremities: Secondary | ICD-10-CM

## 2011-10-29 DIAGNOSIS — D693 Immune thrombocytopenic purpura: Secondary | ICD-10-CM

## 2011-10-29 LAB — CBC WITH DIFFERENTIAL (CANCER CENTER ONLY)
BASO#: 0.2 10*3/uL (ref 0.0–0.2)
BASO%: 1.5 % (ref 0.0–2.0)
Eosinophils Absolute: 1.3 10*3/uL — ABNORMAL HIGH (ref 0.0–0.5)
HCT: 36.7 % (ref 34.8–46.6)
HGB: 12.7 g/dL (ref 11.6–15.9)
LYMPH#: 2.4 10*3/uL (ref 0.9–3.3)
MONO#: 1.2 10*3/uL — ABNORMAL HIGH (ref 0.1–0.9)
NEUT%: 52.8 % (ref 39.6–80.0)
RBC: 3.96 10*6/uL (ref 3.70–5.32)
WBC: 10.6 10*3/uL — ABNORMAL HIGH (ref 3.9–10.0)

## 2011-10-29 NOTE — Progress Notes (Signed)
This office note has been dictated.

## 2011-10-29 NOTE — Progress Notes (Signed)
CC:   Valerie A. Felicity Coyer, MD  DIAGNOSIS:  Refractory idiopathic thrombocytopenic purpura.  CURRENT THERAPY:  Promacta 25 mg p.o. daily.  INTERIM HISTORY:  Ms. Blackston comes in for followup.  She is doing quite well on the Promacta.  She has had no problems with this.  She is taking it daily.  She has had no problems with bleeding or bruising.  She had a good Christmas holiday.  She is still working over at [employer].  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  Her son's girlfriend is going to have her baby in a couple weeks.  Ms. Adee is looking forward to this.  PHYSICAL EXAM:  General:  This is a well-developed, well-nourished white female in no obvious distress.  Vital Signs:  Temperature 97.4, pulse 97, respiratory rate 22, blood pressure 152/92.  Weight is 189. Head/Neck:  Exam shows a normocephalic, atraumatic skull.  There are no ocular or oral lesions.  There are no palpable cervical or supraclavicular lymph nodes.  Lungs are clear bilaterally.  Cardiac: Regular rate and rhythm with a normal S1 and S2.  There are no murmurs, rubs or bruits.  Abdomen:  Soft, good bowel sounds.  She has a well- healed splenectomy scar.  There is no hepatomegaly.  There is no abdominal mass.  Back:  No tenderness over the spine, ribs, or hips. Extremities:  No clubbing, cyanosis or edema.  Neurologic:  Exam shows no focal neurological deficits.  Skin:  Exam shows no rashes, ecchymoses or petechiae.  LABORATORY STUDIES:  White cell count is 10.6, hemoglobin 12.7, hematocrit 39, platelet count 219.  IMPRESSION:  Ms. Hefel is a 51 year old white female with refractory idiopathic thrombocytopenic purpura .  She is on Promacta.  Every now and then, we do have to put her on some pulsed Decadron to help get her platelet count back up.  We will go ahead and continue her on the Promacta for right now.  We will go ahead and plan to get her back to see [place] in a couple  months. We will have her lab work done every 2 weeks now.  I think we can probably go to every 2 weeks instead of weekly.    ______________________________ Josph Macho, M.D. PRE/MEDQ  D:  10/29/2011  T:  10/29/2011  Job:  [id number]

## 2011-11-12 ENCOUNTER — Other Ambulatory Visit (HOSPITAL_BASED_OUTPATIENT_CLINIC_OR_DEPARTMENT_OTHER): Payer: 59 | Admitting: Lab

## 2011-11-12 DIAGNOSIS — D693 Immune thrombocytopenic purpura: Secondary | ICD-10-CM

## 2011-11-12 DIAGNOSIS — Z5181 Encounter for therapeutic drug level monitoring: Secondary | ICD-10-CM

## 2011-11-12 LAB — CBC WITH DIFFERENTIAL/PLATELET
Basophils Absolute: 0.1 10*3/uL (ref 0.0–0.1)
EOS%: 5.7 % (ref 0.0–7.0)
HCT: 36.5 % (ref 34.8–46.6)
HGB: 12.6 g/dL (ref 11.6–15.9)
MCH: 31.5 pg (ref 25.1–34.0)
NEUT%: 65.5 % (ref 38.4–76.8)
Platelets: 289 10*3/uL (ref 145–400)
lymph#: 3.1 10*3/uL (ref 0.9–3.3)

## 2011-11-26 ENCOUNTER — Other Ambulatory Visit (HOSPITAL_BASED_OUTPATIENT_CLINIC_OR_DEPARTMENT_OTHER): Payer: 59 | Admitting: Lab

## 2011-11-26 DIAGNOSIS — D693 Immune thrombocytopenic purpura: Secondary | ICD-10-CM

## 2011-11-26 LAB — CBC WITH DIFFERENTIAL/PLATELET
BASO%: 0.1 % (ref 0.0–2.0)
Basophils Absolute: 0 10*3/uL (ref 0.0–0.1)
EOS%: 0 % (ref 0.0–7.0)
HGB: 13 g/dL (ref 11.6–15.9)
MCH: 32.3 pg (ref 25.1–34.0)
MONO#: 1.1 10*3/uL — ABNORMAL HIGH (ref 0.1–0.9)
NEUT#: 33.2 10*3/uL — ABNORMAL HIGH (ref 1.5–6.5)
RDW: 14.7 % — ABNORMAL HIGH (ref 11.2–14.5)
WBC: 35.8 10*3/uL — ABNORMAL HIGH (ref 3.9–10.3)
lymph#: 1.4 10*3/uL (ref 0.9–3.3)

## 2011-11-26 LAB — TECHNOLOGIST REVIEW

## 2011-12-10 ENCOUNTER — Other Ambulatory Visit (HOSPITAL_BASED_OUTPATIENT_CLINIC_OR_DEPARTMENT_OTHER): Payer: 59 | Admitting: Lab

## 2011-12-10 DIAGNOSIS — D693 Immune thrombocytopenic purpura: Secondary | ICD-10-CM

## 2011-12-10 LAB — CBC WITH DIFFERENTIAL/PLATELET
BASO%: 1.1 % (ref 0.0–2.0)
HCT: 37.9 % (ref 34.8–46.6)
MCHC: 35.1 g/dL (ref 31.5–36.0)
MONO#: 0.9 10*3/uL (ref 0.1–0.9)
NEUT#: 7.3 10*3/uL — ABNORMAL HIGH (ref 1.5–6.5)
RBC: 4.13 10*6/uL (ref 3.70–5.45)
WBC: 12.3 10*3/uL — ABNORMAL HIGH (ref 3.9–10.3)
lymph#: 3.2 10*3/uL (ref 0.9–3.3)
nRBC: 0 % (ref 0–0)

## 2011-12-31 ENCOUNTER — Other Ambulatory Visit (HOSPITAL_BASED_OUTPATIENT_CLINIC_OR_DEPARTMENT_OTHER): Payer: 59 | Admitting: Lab

## 2011-12-31 ENCOUNTER — Telehealth: Payer: Self-pay | Admitting: *Deleted

## 2011-12-31 ENCOUNTER — Ambulatory Visit (HOSPITAL_BASED_OUTPATIENT_CLINIC_OR_DEPARTMENT_OTHER): Payer: 59 | Admitting: Hematology & Oncology

## 2011-12-31 VITALS — BP 186/101 | HR 80 | Temp 96.6°F | Ht 64.0 in | Wt 192.0 lb

## 2011-12-31 DIAGNOSIS — Z0000 Encounter for general adult medical examination without abnormal findings: Secondary | ICD-10-CM

## 2011-12-31 DIAGNOSIS — D693 Immune thrombocytopenic purpura: Secondary | ICD-10-CM

## 2011-12-31 LAB — CBC WITH DIFFERENTIAL (CANCER CENTER ONLY)
BASO#: 0.2 10*3/uL (ref 0.0–0.2)
Eosinophils Absolute: 0.8 10*3/uL — ABNORMAL HIGH (ref 0.0–0.5)
HGB: 13.4 g/dL (ref 11.6–15.9)
LYMPH#: 3 10*3/uL (ref 0.9–3.3)
MONO#: 1.1 10*3/uL — ABNORMAL HIGH (ref 0.1–0.9)
NEUT#: 9.9 10*3/uL — ABNORMAL HIGH (ref 1.5–6.5)
RBC: 4.07 10*6/uL (ref 3.70–5.32)

## 2011-12-31 MED ORDER — ELTROMBOPAG OLAMINE 25 MG PO TABS: 25.0000 mg | ORAL_TABLET | ORAL | Status: DC

## 2011-12-31 NOTE — Telephone Encounter (Signed)
Received staff msg pt made cpx appt need labs entered in EPIC... 12/31/11@11 :04am/LMB

## 2011-12-31 NOTE — Progress Notes (Signed)
Diagnosis: Immune thrombocytopenia - Refractory  Current therapy: Promacta 25 mg by mouth daily  Interim history: Janice Martin is doing okay. 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 She's had no bleeding. There's been no bruising.She is on Promacta daily.  She's had no fever sweats or chills. There's been no bony pain. I did tell her to start taking vitamin D at 1000 units daily.  She's had a good appetite. She is going to lose weight. She's exercising more.  There is no change in bowel or bladder habits.  On physical exam this is a well-developed well-nourished white female in no obvious distress. Vital signs show a temperature of the 96 6 pulse 80 respiratory 16 blood pressure 186/101 and weight is 192 pounds. The head and neck exam shows no ocular or oral lesions. She has no petechia in the oral cavity. There is no adenopathy or neck. Lungs are clear bilaterally. Cardiac exam regular rate and rhythm with no murmurs rubs or bruits. Abdominal exam soft. She has well-healed splenectomy scar. There is no fluid wave. There is no probable hepatomegaly.  Skin exam shows no ecchymosis or petechia. Neurological exam shows no focal neurological deficits.  Laboratory studies shows a platelet count 65,000. White cell count 14.9. Hemoglobin is 13.1.  Impression: Janice Martin has refractory immune thrombocytopenia. Platelet count is going down again. She was at response to Decadron. We will give her a short course of low-dose Decadron at 20 mg a day for 4 days.  We can certainly increase her Promacta dose if needed.  We will continue to check her CBC every other week. I will have her come back to see me in 2 months.

## 2012-01-14 ENCOUNTER — Other Ambulatory Visit (HOSPITAL_BASED_OUTPATIENT_CLINIC_OR_DEPARTMENT_OTHER): Payer: 59 | Admitting: Lab

## 2012-01-14 ENCOUNTER — Other Ambulatory Visit: Payer: Self-pay | Admitting: Hematology & Oncology

## 2012-01-14 ENCOUNTER — Telehealth: Payer: Self-pay | Admitting: Hematology & Oncology

## 2012-01-14 DIAGNOSIS — D693 Immune thrombocytopenic purpura: Secondary | ICD-10-CM

## 2012-01-14 LAB — CBC WITH DIFFERENTIAL/PLATELET
Basophils Absolute: 0.1 10*3/uL (ref 0.0–0.1)
EOS%: 5.1 % (ref 0.0–7.0)
HCT: 41.8 % (ref 34.8–46.6)
HGB: 14.1 g/dL (ref 11.6–15.9)
LYMPH%: 19.3 % (ref 14.0–49.7)
MCH: 32.5 pg (ref 25.1–34.0)
MCHC: 33.7 g/dL (ref 31.5–36.0)
NEUT%: 68.6 % (ref 38.4–76.8)
Platelets: <6 10*3/uL — CL (ref 145–400)
lymph#: 2.1 10*3/uL (ref 0.9–3.3)

## 2012-01-14 NOTE — Telephone Encounter (Signed)
Pt aware of 4-3 lab add on.

## 2012-01-15 ENCOUNTER — Telehealth: Payer: Self-pay | Admitting: Internal Medicine

## 2012-01-15 NOTE — Telephone Encounter (Signed)
The pt is hoping to get a refill on her blood pressure medications.  She has a cpe scheduled for April that is she is hoping to move back if these can be refilled. Thanks!

## 2012-01-19 MED ORDER — NEBIVOLOL HCL 5 MG PO TABS: 5.0000 mg | ORAL_TABLET | Freq: Every day | ORAL | Status: DC

## 2012-01-19 MED ORDER — OLMESARTAN MEDOXOMIL-HCTZ 40-25 MG PO TABS: 1.0000 | ORAL_TABLET | Freq: Every day | ORAL | Status: DC

## 2012-01-21 ENCOUNTER — Encounter: Payer: Self-pay | Admitting: Internal Medicine

## 2012-01-21 ENCOUNTER — Ambulatory Visit (INDEPENDENT_AMBULATORY_CARE_PROVIDER_SITE_OTHER): Payer: 59 | Admitting: Internal Medicine

## 2012-01-21 ENCOUNTER — Other Ambulatory Visit (HOSPITAL_BASED_OUTPATIENT_CLINIC_OR_DEPARTMENT_OTHER): Payer: 59 | Admitting: Lab

## 2012-01-21 VITALS — BP 142/92 | HR 61 | Temp 98.0°F | Resp 16 | Ht 64.0 in | Wt 186.0 lb

## 2012-01-21 DIAGNOSIS — I10 Essential (primary) hypertension: Secondary | ICD-10-CM

## 2012-01-21 DIAGNOSIS — Z0000 Encounter for general adult medical examination without abnormal findings: Secondary | ICD-10-CM

## 2012-01-21 DIAGNOSIS — D693 Immune thrombocytopenic purpura: Secondary | ICD-10-CM

## 2012-01-21 LAB — CBC WITH DIFFERENTIAL/PLATELET
Basophils Absolute: 0 10*3/uL (ref 0.0–0.1)
Eosinophils Absolute: 0 10*3/uL (ref 0.0–0.5)
HCT: 41.3 % (ref 34.8–46.6)
LYMPH%: 4.3 % — ABNORMAL LOW (ref 14.0–49.7)
MCV: 95.1 fL (ref 79.5–101.0)
MONO#: 1.4 10*3/uL — ABNORMAL HIGH (ref 0.1–0.9)
MONO%: 4.7 % (ref 0.0–14.0)
NEUT#: 28.2 10*3/uL — ABNORMAL HIGH (ref 1.5–6.5)
NEUT%: 90.7 % — ABNORMAL HIGH (ref 38.4–76.8)
Platelets: 629 10*3/uL — ABNORMAL HIGH (ref 145–400)
WBC: 31 10*3/uL — ABNORMAL HIGH (ref 3.9–10.3)
nRBC: 0 % (ref 0–0)

## 2012-01-21 LAB — URINALYSIS, MICROSCOPIC - CHCC
Glucose: NEGATIVE g/dL
Nitrite: NEGATIVE

## 2012-01-21 LAB — HEPATIC FUNCTION PANEL
ALT: 15 U/L (ref 0–35)
AST: 11 U/L (ref 0–37)
Albumin: 3.9 g/dL (ref 3.5–5.2)
Alkaline Phosphatase: 69 U/L (ref 39–117)
Indirect Bilirubin: 0.4 mg/dL (ref 0.0–0.9)
Total Protein: 6.2 g/dL (ref 6.0–8.3)

## 2012-01-21 LAB — BASIC METABOLIC PANEL
BUN: 24 mg/dL — ABNORMAL HIGH (ref 6–23)
Calcium: 9 mg/dL (ref 8.4–10.5)
Chloride: 102 mEq/L (ref 96–112)
Creatinine, Ser: 0.93 mg/dL (ref 0.50–1.10)

## 2012-01-21 NOTE — Assessment & Plan Note (Signed)
BP Readings from Last 3 Encounters:  01/21/12 142/92  12/31/11 186/101  09/15/11 145/73   Pt reports great variability, including good control at home Exacerbated by stress - We discussed options and elected not to change medications today the patient to continue to monitor and call if uncontrolled before next is

## 2012-01-21 NOTE — Patient Instructions (Signed)
It was good to see you today. We have reviewed your prior records including labs and tests today Test(s) ordered today. Your results will be called to you after review (48-72hours after test completion). If any changes need to be made, you will be notified at that time. Health Maintenance reviewed - everything else is up todate. Medications reviewed, no changes at this time. Refill on medication(s) as discussed today. Please schedule followup in 12 months for medical physical and labs, call sooner if problems.

## 2012-01-21 NOTE — Progress Notes (Signed)
Subjective:    Patient ID: Janice Martin, female    DOB: 29-Mar-1961, 51 y.o.   MRN: [id number]  HPI  patient is here today for annual physical. Patient feels well overall.  Also reviewed chronic medical issues:  ABL anemia - iron defic -05/2010 hosp for same no bleeding since 2011 heavy menses, clotting and menorrhgia    anxiety - onset years ago (when lost job/house 2004) Intermittent stressors persist hx same 20years ago  - use trial xanax for short period of time then - denies depression at this time - no sadness or sleep problems - trial ciltalopram rx'd 04/2010, stopped taking due to fear of suicide risk on warning labels   HTN - often uncontrolled despite meds - exac by stress hx same, prev on lisinopril - no known kidney dz or cardiac problems - +mild swelling in feet, chronic - tolerating bystolic without adverse side effects  -    began ARB/hct 06/2010 no chest pain, shortness of breath, change in edema or weakness   ITP - recent flares reviewed -  Hx bleeding (heavy menses) as well as hypercoag (clot 06/2010)- RLE superficial saphenous thrombosis> started arixtra and coumadin fall 2011 x 6 weeks, on ASA 81 qd since following with heme for same - Promacta weekly  Past Medical History  Diagnosis Date  . ANEMIA-IRON DEFICIENCY   . Immune thrombocytopenic purpura   . LEUKOCYTOSIS UNSPECIFIED   . Anxiety state, unspecified   . HYPERTENSION    Family History  Problem Relation Age of Onset  . Arthritis Mother   . Arthritis Father   . Mental illness Father   . Hypertension Other     Parent  . Heart disease Other     Grandparent  . Stroke Other     grandparent   History  Substance Use Topics  . Smoking status: Never Smoker   . Smokeless tobacco: Not on file   Comment: Married, lives with spouse and 66 yo son. works in [employer] division @ [employer] cancer center  . Alcohol Use: No   Review of Systems Constitutional: Negative for fever or weight change.  Respiratory:  Negative for cough and shortness of breath.   Cardiovascular: Negative for chest pain or palpitations.  Gastrointestinal: Negative for abdominal pain, no bowel changes.  Musculoskeletal: Negative for gait problem or joint swelling.  Skin: Negative for rash.  Neurological: Negative for dizziness or headache.  No other specific complaints in a complete review of systems (except as listed in HPI above).     Objective:   Physical Exam BP 142/92  Pulse 61  Temp(Src) 98 F (36.7 C) (Oral)  Resp 16  Ht 5\' 4"  (1.626 m)  Wt 186 lb (84.369 kg)  BMI 31.93 kg/m2  SpO2 98% Wt Readings from Last 3 Encounters:  01/21/12 186 lb (84.369 kg)  12/31/11 192 lb (87.091 kg)  09/15/11 184 lb 11.2 oz (83.779 kg)   Constitutional: She appears well-developed and well-nourished. No distress.  HENT: Head: Normocephalic and atraumatic. Ears: B TMs ok, no erythema or effusion; Nose: Nose normal. Mouth/Throat: Oropharynx is clear and moist. No oropharyngeal exudate.  Eyes: Conjunctivae and EOM are normal. Pupils are equal, round, and reactive to light. No scleral icterus.  Neck: Normal range of motion. Neck supple. No JVD present. No thyromegaly present.  Cardiovascular: Normal rate, regular rhythm and normal heart sounds.  No murmur heard. No BLE edema. Pulmonary/Chest: Effort normal and breath sounds normal. No respiratory distress. She has no wheezes.  Abdominal: Soft. Bowel sounds are normal. She exhibits no distension. There is no tenderness. no masses Musculoskeletal: Normal range of motion, no joint effusions. No gross deformities Neurological: She is alert and oriented to person, place, and time. No cranial nerve deficit. Coordination normal.  Skin: Skin is warm and dry. No rash noted. No erythema.  Psychiatric: She has a normal mood and affect. Her behavior is normal. Judgment and thought content normal.   Lab Results  Component Value Date   WBC 31.0* 01/21/2012   HGB 13.6 01/21/2012   HCT 41.3  01/21/2012   PLT 629* 01/21/2012   GLUCOSE 87 08/18/2011   GLUCOSE 87 08/18/2011   CHOL 178 09/28/2007   HDL 49 09/28/2007   LDLCALC 117 09/28/2007   ALT 21 08/18/2011   ALT 21 08/18/2011   AST 17 08/18/2011   AST 17 08/18/2011   NA 138 08/18/2011   NA 138 08/18/2011   K 3.9 08/18/2011   K 3.9 08/18/2011   CL 100 08/18/2011   CL 100 08/18/2011   CREATININE 1.06 08/18/2011   CREATININE 1.06 08/18/2011   BUN 20 08/18/2011   BUN 20 08/18/2011   CO2 28 08/18/2011   CO2 28 08/18/2011   TSH 1.077 06/17/2010   INR 0.9* 09/30/2010   EKG: NSR @ 68 bpm, no ST-T changes    Assessment & Plan:   CPX - v70.0 - Patient has been counseled on age-appropriate routine health concerns for screening and prevention. These are reviewed and up-to-date. Immunizations are up-to-date or declined. Labs ordered and ECG reviewed.

## 2012-01-21 NOTE — Assessment & Plan Note (Signed)
Associated with menorrhagia and clotting fall 2011 Status post 6 weeks anticoagulation 12 2011, now ASA 81 mg daily Follows regularly with hematology for same due to frequent flares On eltombopag weekly for same

## 2012-01-23 ENCOUNTER — Telehealth: Payer: Self-pay

## 2012-01-23 NOTE — Telephone Encounter (Signed)
From my perspective, okay to change to Diovan HCT 320/25 (one tab daily = same as Benicar HCT 40/25) - please notify patient of same and send prescription rather than PA

## 2012-01-23 NOTE — Telephone Encounter (Signed)
Left message on machine for pt to return my call  

## 2012-01-23 NOTE — Telephone Encounter (Signed)
PA received on Benicar HCT 40-25mg . Covered alterative are Diovan HCT, Cozaar, Hyzaar, and Micardis HCT. Please advise, okay to changed to an alternative or proceed with PA?  [phone] ID [id number]

## 2012-01-26 NOTE — Telephone Encounter (Signed)
Left message on machine for pt to return my call  

## 2012-01-27 MED ORDER — VALSARTAN-HYDROCHLOROTHIAZIDE 320-25 MG PO TABS: 1.0000 | ORAL_TABLET | Freq: Every day | ORAL | Status: DC

## 2012-01-27 NOTE — Telephone Encounter (Signed)
Notified pt with md response, also sent diovan to [facility]... 01/27/12@9 :22am/LMB

## 2012-01-27 NOTE — Telephone Encounter (Signed)
yes

## 2012-01-27 NOTE — Telephone Encounter (Signed)
Pt return call back gave md response. Pt agreed to start taking the diovan, but she want to know does she also need to continue taking the bystolic... 01/27/12@8 :24am/LMB

## 2012-01-28 ENCOUNTER — Other Ambulatory Visit (HOSPITAL_BASED_OUTPATIENT_CLINIC_OR_DEPARTMENT_OTHER): Payer: 59 | Admitting: Lab

## 2012-01-28 DIAGNOSIS — D693 Immune thrombocytopenic purpura: Secondary | ICD-10-CM

## 2012-01-28 LAB — CBC WITH DIFFERENTIAL/PLATELET
BASO%: 0.3 % (ref 0.0–2.0)
LYMPH%: 22.2 % (ref 14.0–49.7)
MCHC: 34.8 g/dL (ref 31.5–36.0)
MONO#: 1.4 10*3/uL — ABNORMAL HIGH (ref 0.1–0.9)
Platelets: 265 10*3/uL (ref 145–400)
RBC: 4.23 10*6/uL (ref 3.70–5.45)
WBC: 18.4 10*3/uL — ABNORMAL HIGH (ref 3.9–10.3)
lymph#: 4.1 10*3/uL — ABNORMAL HIGH (ref 0.9–3.3)
nRBC: 0 % (ref 0–0)

## 2012-02-11 ENCOUNTER — Other Ambulatory Visit (HOSPITAL_BASED_OUTPATIENT_CLINIC_OR_DEPARTMENT_OTHER): Payer: 59 | Admitting: Lab

## 2012-02-11 DIAGNOSIS — D693 Immune thrombocytopenic purpura: Secondary | ICD-10-CM

## 2012-02-11 LAB — CBC WITH DIFFERENTIAL/PLATELET
BASO%: 0.9 % (ref 0.0–2.0)
Basophils Absolute: 0.1 10*3/uL (ref 0.0–0.1)
EOS%: 3 % (ref 0.0–7.0)
HGB: 13.3 g/dL (ref 11.6–15.9)
MCH: 31.9 pg (ref 25.1–34.0)
RDW: 14.1 % (ref 11.2–14.5)
lymph#: 3.9 10*3/uL — ABNORMAL HIGH (ref 0.9–3.3)
nRBC: 0 % (ref 0–0)

## 2012-02-26 ENCOUNTER — Ambulatory Visit (HOSPITAL_BASED_OUTPATIENT_CLINIC_OR_DEPARTMENT_OTHER): Payer: 59 | Admitting: Hematology & Oncology

## 2012-02-26 ENCOUNTER — Other Ambulatory Visit (HOSPITAL_BASED_OUTPATIENT_CLINIC_OR_DEPARTMENT_OTHER): Payer: 59 | Admitting: Lab

## 2012-02-26 VITALS — BP 152/92 | HR 93 | Temp 97.4°F | Ht 64.0 in | Wt 186.0 lb

## 2012-02-26 DIAGNOSIS — D693 Immune thrombocytopenic purpura: Secondary | ICD-10-CM

## 2012-02-26 DIAGNOSIS — I10 Essential (primary) hypertension: Secondary | ICD-10-CM

## 2012-02-26 LAB — CBC WITH DIFFERENTIAL (CANCER CENTER ONLY)
BASO#: 0.2 10*3/uL (ref 0.0–0.2)
BASO%: 1.4 % (ref 0.0–2.0)
EOS%: 3.8 % (ref 0.0–7.0)
LYMPH%: 26.3 % (ref 14.0–48.0)
MCHC: 35.3 g/dL (ref 32.0–36.0)
MCV: 93 fL (ref 81–101)
MONO%: 6.8 % (ref 0.0–13.0)
NEUT%: 61.7 % (ref 39.6–80.0)
RBC: 4.11 10*6/uL (ref 3.70–5.32)
RDW: 13.3 % (ref 11.1–15.7)
WBC: 12.1 10*3/uL — ABNORMAL HIGH (ref 3.9–10.0)

## 2012-02-26 MED ORDER — ELTROMBOPAG OLAMINE 50 MG PO TABS: 50.0000 mg | ORAL_TABLET | Freq: Every day | ORAL | Status: DC

## 2012-02-26 NOTE — Progress Notes (Signed)
Addended by: Arlan Organ R on: 02/26/2012 09:23 AM   Modules accepted: Orders

## 2012-02-26 NOTE — Progress Notes (Signed)
This office note has been dictated.

## 2012-02-27 NOTE — Progress Notes (Signed)
CC:   Valerie A. Felicity Coyer, MD  DIAGNOSIS:  Refractory immune thrombocytopenia.  CURRENT THERAPY: 1. Promacta 25 mg p.o. daily. 2. Intermittent Decadron.  INTERVAL HISTORY:  Mrs. Stocking comes in for followup.  She is doing all right.  She has had no problem with bleeding or bruising.  She is taking Promacta daily.  There is no nausea or vomiting.  Unfortunately, her platelet count is dropping.  Back on 04/24, it was 288, today it is 61.  I told her to go ahead and increase the Promacta to 50 mg a day.  She uses it as well with prednisone.  We are checking her blood counts every couple weeks.  Will keep her on that schedule.  She has had no cough or shortness of breath.  She has had no fevers, sweats or chills.  PHYSICAL EXAMINATION:  This is a well-developed well-nourished white female in no obvious distress.  Vital signs:  97.4, pulse 93, respiratory rate 18, blood pressure 152/92.  Weight is 186.  Head and neck:  Normocephalic, atraumatic skull.  There are no ocular or oral lesions.  There are no palpable cervical or supraclavicular lymph nodes. Lungs:  Clear bilaterally.  Cardiac: Regular rhythm with a normal S1 and S2.  There are no murmurs, rubs or bruits.  Abdomen:  Soft with good bowel sounds.  Has a well-healed splenectomy scar.  There is no fluid wave.  There is no palpable hepatomegaly.  Extremities:  No clubbing, cyanosis or edema.  Neurologic:  No focal neurological deficits.  Skin: No rashes, ecchymoses or petechia.  Neurologic:  No focal neurological deficits.  LABORATORY STUDIES:  White cell count is 9.1, hemoglobin 13.4, hematocrit 38, platelet count 61,000.  White cell differential was normal.  IMPRESSION:  Mrs. Barefield is a 51 year old white female with refractory immune thrombocytopenia.  She has her platelet count going back down again.  Again, we will increase Promacta to 50 mg a day.  We will go ahead and plan to have her CBC checked every 2 weeks  as typical.  I thought about giving her Rituxan.  She has never had Rituxan before. This would make sense if she does not have response to the increased Promacta.  I will plan to see her back in another in another month.    ______________________________ Josph Macho, M.D. PRE/MEDQ  D:  02/26/2012  T:  02/27/2012  Job:  [id number]

## 2012-03-03 ENCOUNTER — Other Ambulatory Visit: Payer: Self-pay | Admitting: *Deleted

## 2012-03-03 DIAGNOSIS — D693 Immune thrombocytopenic purpura: Secondary | ICD-10-CM

## 2012-03-03 MED ORDER — ELTROMBOPAG OLAMINE 50 MG PO TABS: 50.0000 mg | ORAL_TABLET | Freq: Every day | ORAL | Status: AC

## 2012-03-04 ENCOUNTER — Other Ambulatory Visit (HOSPITAL_BASED_OUTPATIENT_CLINIC_OR_DEPARTMENT_OTHER): Payer: 59 | Admitting: Lab

## 2012-03-04 DIAGNOSIS — D693 Immune thrombocytopenic purpura: Secondary | ICD-10-CM

## 2012-03-04 DIAGNOSIS — I10 Essential (primary) hypertension: Secondary | ICD-10-CM

## 2012-03-04 LAB — CBC WITH DIFFERENTIAL/PLATELET
BASO%: 1.1 % (ref 0.0–2.0)
EOS%: 7.2 % — ABNORMAL HIGH (ref 0.0–7.0)
HGB: 13.7 g/dL (ref 11.6–15.9)
MCH: 31.9 pg (ref 25.1–34.0)
MCHC: 35.2 g/dL (ref 31.5–36.0)
MONO%: 7.5 % (ref 0.0–14.0)
RBC: 4.29 10*6/uL (ref 3.70–5.45)
RDW: 13.9 % (ref 11.2–14.5)
lymph#: 3.5 10*3/uL — ABNORMAL HIGH (ref 0.9–3.3)
nRBC: 0 % (ref 0–0)

## 2012-03-10 ENCOUNTER — Other Ambulatory Visit (HOSPITAL_BASED_OUTPATIENT_CLINIC_OR_DEPARTMENT_OTHER): Payer: 59 | Admitting: Lab

## 2012-03-10 DIAGNOSIS — D693 Immune thrombocytopenic purpura: Secondary | ICD-10-CM

## 2012-03-10 LAB — CBC WITH DIFFERENTIAL/PLATELET
BASO%: 0.5 % (ref 0.0–2.0)
Eosinophils Absolute: 1.5 10*3/uL — ABNORMAL HIGH (ref 0.0–0.5)
MCHC: 35.5 g/dL (ref 31.5–36.0)
MONO#: 1.3 10*3/uL — ABNORMAL HIGH (ref 0.1–0.9)
NEUT#: 11 10*3/uL — ABNORMAL HIGH (ref 1.5–6.5)
RBC: 4.32 10*6/uL (ref 3.70–5.45)
RDW: 13.9 % (ref 11.2–14.5)
WBC: 16.6 10*3/uL — ABNORMAL HIGH (ref 3.9–10.3)
nRBC: 1 % — ABNORMAL HIGH (ref 0–0)

## 2012-03-18 ENCOUNTER — Other Ambulatory Visit (HOSPITAL_BASED_OUTPATIENT_CLINIC_OR_DEPARTMENT_OTHER): Payer: 59 | Admitting: Lab

## 2012-03-18 ENCOUNTER — Telehealth: Payer: Self-pay | Admitting: *Deleted

## 2012-03-18 DIAGNOSIS — D693 Immune thrombocytopenic purpura: Secondary | ICD-10-CM

## 2012-03-18 LAB — CBC WITH DIFFERENTIAL/PLATELET
BASO%: 1.1 % (ref 0.0–2.0)
Basophils Absolute: 0.1 10*3/uL (ref 0.0–0.1)
EOS%: 18 % — ABNORMAL HIGH (ref 0.0–7.0)
HCT: 38.3 % (ref 34.8–46.6)
HGB: 13.5 g/dL (ref 11.6–15.9)
MCH: 31.7 pg (ref 25.1–34.0)
MCHC: 35.2 g/dL (ref 31.5–36.0)
MONO#: 1 10*3/uL — ABNORMAL HIGH (ref 0.1–0.9)
NEUT%: 49.3 % (ref 38.4–76.8)
RDW: 13.6 % (ref 11.2–14.5)
WBC: 10.9 10*3/uL — ABNORMAL HIGH (ref 3.9–10.3)
lymph#: 2.5 10*3/uL (ref 0.9–3.3)

## 2012-03-18 NOTE — Telephone Encounter (Signed)
Spoke to pt regarding CBC results from today. To resume Promacta at 1 tab twice weekly (M & Th) and re-check CBC as scheduled in 1 week. Verbalized understanding.

## 2012-03-23 ENCOUNTER — Other Ambulatory Visit: Payer: 59 | Admitting: Lab

## 2012-03-26 ENCOUNTER — Other Ambulatory Visit: Payer: 59 | Admitting: Lab

## 2012-03-26 ENCOUNTER — Ambulatory Visit: Payer: 59 | Admitting: Hematology & Oncology

## 2012-03-26 ENCOUNTER — Other Ambulatory Visit (HOSPITAL_BASED_OUTPATIENT_CLINIC_OR_DEPARTMENT_OTHER): Payer: 59 | Admitting: Lab

## 2012-03-26 DIAGNOSIS — I82819 Embolism and thrombosis of superficial veins of unspecified lower extremities: Secondary | ICD-10-CM

## 2012-03-26 LAB — CBC WITH DIFFERENTIAL/PLATELET
Basophils Absolute: 0 10*3/uL (ref 0.0–0.1)
Eosinophils Absolute: 0 10*3/uL (ref 0.0–0.5)
HCT: 37.2 % (ref 34.8–46.6)
HGB: 12.6 g/dL (ref 11.6–15.9)
MCH: 31.8 pg (ref 25.1–34.0)
MONO#: 1.9 10*3/uL — ABNORMAL HIGH (ref 0.1–0.9)
NEUT#: 28.2 10*3/uL — ABNORMAL HIGH (ref 1.5–6.5)
NEUT%: 84.7 % — ABNORMAL HIGH (ref 38.4–76.8)
RDW: 14.2 % (ref 11.2–14.5)
lymph#: 3.2 10*3/uL (ref 0.9–3.3)

## 2012-03-26 LAB — TECHNOLOGIST REVIEW

## 2012-03-29 ENCOUNTER — Ambulatory Visit (HOSPITAL_BASED_OUTPATIENT_CLINIC_OR_DEPARTMENT_OTHER): Payer: 59 | Admitting: Hematology & Oncology

## 2012-03-29 VITALS — BP 143/76 | HR 84 | Temp 97.2°F | Ht 64.0 in | Wt 188.0 lb

## 2012-03-29 DIAGNOSIS — D693 Immune thrombocytopenic purpura: Secondary | ICD-10-CM

## 2012-03-29 NOTE — Progress Notes (Signed)
This office note has been dictated.

## 2012-03-30 NOTE — Progress Notes (Signed)
CC:   Janice A. Felicity Coyer, MD  DIAGNOSIS:  Refractory ITP.  CURRENT THERAPY: 1. Promacta 25mg  p.o. q. Monday and Thursday. 2. Intermittent Decadron.  INTERIM HISTORY:  Janice Martin comes in for followup.  She said that she noted some petechiae on her arms a last week.  Because of this, she is worried that her platelet count was dropping quickly.  She got herself back on some Decadron, which she has at home.  Two weeks ago, her platelet count was 908.  Last week it was 416. Again, she comes down gradually when she gets off the Decadron.  She is on Promacta twice a week.  We have flexibility for this as we do not need to __________ with her platelet counts.  She has had no obvious bleeding.  There has been no change in bowel or bladder habits.  She is still working without difficulty.  She has had no nausea or vomiting.  She has had no headache.  She has had no leg swelling.  PHYSICAL EXAMINATION:  This is a well-developed well-nourished white female in no obvious distress.  Vital signs:  97.2, pulse 84, respiratory rate 18, blood pressure 143/76.  Weight is 188.  Head and neck:  A normocephalic, atraumatic skull.  There are no ocular or oral lesions.  There is no scleral icterus.  There is no adenopathy in the neck.  Lungs:  Clear to percussion and auscultation bilaterally. Cardiac:  Regular rate and rhythm with a normal S1, S2.  There are no murmurs, rubs or bruits.  Abdomen:  Soft with good bowel sounds.  She has a well-healed laparotomy scar from her splenectomy.  There is no fluid wave.  There is no palpable hepatomegaly.  Extremities:  No clubbing, cyanosis or edema.  She has good range of motion of her joints.  Skin:  No petechia.  Neurologic:  No focal neurological deficits.  LABORATORY STUDIES:  White cell count 33.3, hemoglobin 12.6, hematocrit 37.2, platelet count 142.  IMPRESSION:  Janice Martin is a 51 year old white female with refractory ITP.  She is on Promacta.  She  responds pretty nicely to Promacta.  This is why we only have her on twice-a-week dosing.  We will continue to follow her blood work weekly.  I will plan see back in another month myself.    ______________________________ Josph Macho, M.D. PRE/MEDQ  D:  03/29/2012  T:  03/30/2012  Job:  [id number]

## 2012-04-07 ENCOUNTER — Other Ambulatory Visit (HOSPITAL_BASED_OUTPATIENT_CLINIC_OR_DEPARTMENT_OTHER): Payer: 59 | Admitting: Lab

## 2012-04-07 DIAGNOSIS — D509 Iron deficiency anemia, unspecified: Secondary | ICD-10-CM

## 2012-04-07 DIAGNOSIS — D693 Immune thrombocytopenic purpura: Secondary | ICD-10-CM

## 2012-04-07 LAB — CBC WITH DIFFERENTIAL/PLATELET
BASO%: 0.8 % (ref 0.0–2.0)
EOS%: 7.3 % — ABNORMAL HIGH (ref 0.0–7.0)
HCT: 37.2 % (ref 34.8–46.6)
LYMPH%: 24.4 % (ref 14.0–49.7)
MCH: 31.5 pg (ref 25.1–34.0)
MCHC: 34.7 g/dL (ref 31.5–36.0)
MONO%: 9.8 % (ref 0.0–14.0)
NEUT%: 57.7 % (ref 38.4–76.8)
Platelets: 221 10*3/uL (ref 145–400)
RBC: 4.09 10*6/uL (ref 3.70–5.45)
WBC: 12 10*3/uL — ABNORMAL HIGH (ref 3.9–10.3)
nRBC: 0 % (ref 0–0)

## 2012-04-09 ENCOUNTER — Telehealth: Payer: Self-pay | Admitting: *Deleted

## 2012-04-14 ENCOUNTER — Other Ambulatory Visit (HOSPITAL_BASED_OUTPATIENT_CLINIC_OR_DEPARTMENT_OTHER): Payer: 59 | Admitting: Lab

## 2012-04-14 DIAGNOSIS — D693 Immune thrombocytopenic purpura: Secondary | ICD-10-CM

## 2012-04-14 DIAGNOSIS — D509 Iron deficiency anemia, unspecified: Secondary | ICD-10-CM

## 2012-04-14 LAB — CBC WITH DIFFERENTIAL/PLATELET
BASO%: 0.3 % (ref 0.0–2.0)
Basophils Absolute: 0.1 10*3/uL (ref 0.0–0.1)
EOS%: 0.2 % (ref 0.0–7.0)
MCH: 31.7 pg (ref 25.1–34.0)
MCHC: 33.6 g/dL (ref 31.5–36.0)
MCV: 94.4 fL (ref 79.5–101.0)
MONO%: 6.2 % (ref 0.0–14.0)
RBC: 3.98 10*6/uL (ref 3.70–5.45)
RDW: 14.5 % (ref 11.2–14.5)
lymph#: 3.7 10*3/uL — ABNORMAL HIGH (ref 0.9–3.3)

## 2012-04-21 ENCOUNTER — Other Ambulatory Visit (HOSPITAL_BASED_OUTPATIENT_CLINIC_OR_DEPARTMENT_OTHER): Payer: 59 | Admitting: Lab

## 2012-04-21 ENCOUNTER — Other Ambulatory Visit: Payer: Self-pay | Admitting: *Deleted

## 2012-04-21 DIAGNOSIS — D693 Immune thrombocytopenic purpura: Secondary | ICD-10-CM

## 2012-04-21 LAB — CBC WITH DIFFERENTIAL/PLATELET
BASO%: 1.7 % (ref 0.0–2.0)
LYMPH%: 23.8 % (ref 14.0–49.7)
MCHC: 35 g/dL (ref 31.5–36.0)
MONO#: 0.9 10*3/uL (ref 0.1–0.9)
Platelets: 5 10*3/uL — CL (ref 145–400)
RBC: 4.04 10*6/uL (ref 3.70–5.45)
RDW: 14.5 % (ref 11.2–14.5)
WBC: 10.9 10*3/uL — ABNORMAL HIGH (ref 3.9–10.3)
nRBC: 1 % — ABNORMAL HIGH (ref 0–0)

## 2012-04-21 LAB — TECHNOLOGIST REVIEW

## 2012-04-21 MED ORDER — DEXAMETHASONE 4 MG PO TABS: 4.0000 mg | ORAL_TABLET | ORAL | Status: DC | PRN

## 2012-04-21 NOTE — Telephone Encounter (Signed)
Plt count 5k. Pt made aware. Denies any s/s of bleeding. Reviewed bleeding precautions with understanding. To start Decadron per Dr Myna Hidalgo.. Refill called to [facility].

## 2012-04-29 ENCOUNTER — Ambulatory Visit (HOSPITAL_BASED_OUTPATIENT_CLINIC_OR_DEPARTMENT_OTHER): Payer: 59 | Admitting: Hematology & Oncology

## 2012-04-29 ENCOUNTER — Other Ambulatory Visit (HOSPITAL_BASED_OUTPATIENT_CLINIC_OR_DEPARTMENT_OTHER): Payer: 59 | Admitting: Lab

## 2012-04-29 VITALS — BP 144/85 | HR 87 | Temp 97.6°F | Ht 64.0 in | Wt 186.0 lb

## 2012-04-29 DIAGNOSIS — D693 Immune thrombocytopenic purpura: Secondary | ICD-10-CM

## 2012-04-29 LAB — MANUAL DIFFERENTIAL (CHCC SATELLITE)
ALC: 8.3 10*3/uL — ABNORMAL HIGH (ref 0.6–2.2)
ANC (CHCC HP manual diff): 22.5 10*3/uL — ABNORMAL HIGH (ref 1.5–6.7)
Myelocytes: 2 % — ABNORMAL HIGH (ref 0–0)
nRBC: 5 % — ABNORMAL HIGH (ref 0–0)

## 2012-04-29 LAB — CBC WITH DIFFERENTIAL (CANCER CENTER ONLY)
MCH: 32.2 pg (ref 26.0–34.0)
MCHC: 34.7 g/dL (ref 32.0–36.0)
Platelets: 269 10*3/uL (ref 145–400)
RBC: 4.01 10*6/uL (ref 3.70–5.32)

## 2012-04-29 LAB — CHCC SATELLITE - SMEAR

## 2012-04-29 NOTE — Progress Notes (Signed)
This office note has been dictated.

## 2012-04-30 NOTE — Progress Notes (Signed)
CC:   Valerie A. Felicity Coyer, MD  DIAGNOSIS:  Refractory immune thrombocytopenia.  CURRENT THERAPY:  Promacta 50 mg p.o. daily, Decadron-intermittent, as indicated for thrombocytopenia relapse.  INTERIM HISTORY:  Ms. Voong comes in for followup.  Her platelet count did drop a week or so ago.  Her platelet count was less than 5 back on 04/21/2012. She restarted Decadron.  She always does well with Decadron. She also began taking the Promacta daily.  She is under a lot of stress.  There are a lot of things going on at work.  She works over at radiation oncology.  There is changes in the [employer] system that hopefully she will not lose her job over.  She has had no bleeding or bruising.  She has had a good appetite. There has been no change in bowel or bladder habits.  There is no headache.  There is no nausea or vomiting.  PHYSICAL EXAMINATION:  General:  This is a well-developed, well- nourished, white female in no obvious distress.  Vital signs: Temperature of 97.6, pulse 87, respiratory rate 20, blood pressure 144/85.  Weight is 186.  Head and neck: Normocephalic and atraumatic skull.  There are no ocular or oral lesions.  There are no palpable cervical or supraclavicular lymph nodes.  Lungs:  Clear bilaterally. Cardiac:  Regular rate and rhythm with a normal S1 and S2.  There are no murmurs, rubs or bruits.  Abdomen:  Soft with good bowel sounds.  She has a well-healed splenectomy scar.  There is no fluid wave.  There is no palpable hepatomegaly.  Extremities:  Show no clubbing, cyanosis or edema.  Neurological:  Shows no focal neurological deficit.  Skin:  No rashes, ecchymosis or petechia.  LABORATORY STUDIES:  White cell count is 34.6, hemoglobin 13, hematocrit 37, platelet count 269.  IMPRESSION:  Ms. Muhlbauer is a 51 year old white female with refractory immune thrombocytopenia.  She is on Promacta.  She varies the dose depending on her platelet count.  Again, she typically  responds very nicely once she restarts Decadron.  She has never had Rituxan.  I think that Rituxan might be a good option to consider down the road if we find that she has more frequent relapses.  We will continue to check her platelet count weekly.  I will plan to see her back in another 4-6 weeks.   ______________________________ Josph Macho, M.D. PRE/MEDQ  D:  04/29/2012  T:  04/30/2012  Job:  [id number]

## 2012-05-06 ENCOUNTER — Other Ambulatory Visit (HOSPITAL_BASED_OUTPATIENT_CLINIC_OR_DEPARTMENT_OTHER): Payer: 59 | Admitting: Lab

## 2012-05-06 DIAGNOSIS — I10 Essential (primary) hypertension: Secondary | ICD-10-CM

## 2012-05-06 DIAGNOSIS — D693 Immune thrombocytopenic purpura: Secondary | ICD-10-CM

## 2012-05-06 LAB — CBC WITH DIFFERENTIAL/PLATELET
Basophils Absolute: 0.1 10*3/uL (ref 0.0–0.1)
EOS%: 5.3 % (ref 0.0–7.0)
HCT: 37.4 % (ref 34.8–46.6)
HGB: 12.9 g/dL (ref 11.6–15.9)
LYMPH%: 18.3 % (ref 14.0–49.7)
MCH: 31.5 pg (ref 25.1–34.0)
MCV: 91.2 fL (ref 79.5–101.0)
MONO%: 10 % (ref 0.0–14.0)
NEUT%: 65.5 % (ref 38.4–76.8)
Platelets: 151 10*3/uL (ref 145–400)

## 2012-05-13 ENCOUNTER — Telehealth: Payer: Self-pay | Admitting: Hematology & Oncology

## 2012-05-13 ENCOUNTER — Other Ambulatory Visit (HOSPITAL_BASED_OUTPATIENT_CLINIC_OR_DEPARTMENT_OTHER): Payer: 59 | Admitting: Lab

## 2012-05-13 DIAGNOSIS — D693 Immune thrombocytopenic purpura: Secondary | ICD-10-CM

## 2012-05-13 LAB — CBC WITH DIFFERENTIAL/PLATELET
Eosinophils Absolute: 0.7 10*3/uL — ABNORMAL HIGH (ref 0.0–0.5)
LYMPH%: 30.4 % (ref 14.0–49.7)
MCH: 31.6 pg (ref 25.1–34.0)
MCV: 90.2 fL (ref 79.5–101.0)
MONO%: 8.6 % (ref 0.0–14.0)
NEUT#: 5.7 10*3/uL (ref 1.5–6.5)
Platelets: 53 10*3/uL — ABNORMAL LOW (ref 145–400)
RBC: 3.99 10*6/uL (ref 3.70–5.45)
nRBC: 1 % — ABNORMAL HIGH (ref 0–0)

## 2012-05-13 NOTE — Telephone Encounter (Signed)
I talked w/ Mrs. Cannaday today. Her platelet count is now down to 53,000. She does not think that thePromacta is helping her. She would like to stop it.. I have no problems with this.  I talked her about Rituxan. She's never had this. I think this would be a good idea for her. I told her that we do this once a week for 4 weeks. I told her that the first dose to get over 8 hours twisted to help with the body adapting to the medicine. After this dose, you should not take maybe 2 hours. I also told her that if Rituxan work, it would be a delayed onset.  Should like to start this after her vacation the end of August.  I told you back on Decadron. Decadron does work for her. She has her protocol that she uses.    I see her back in August. She says her lab done weekly.  We will continue to monitor her platelets and make any adjustments to her program.  Hewitt Shorts

## 2012-05-20 ENCOUNTER — Other Ambulatory Visit (HOSPITAL_BASED_OUTPATIENT_CLINIC_OR_DEPARTMENT_OTHER): Payer: 59 | Admitting: Lab

## 2012-05-20 DIAGNOSIS — D693 Immune thrombocytopenic purpura: Secondary | ICD-10-CM

## 2012-05-20 LAB — CBC WITH DIFFERENTIAL/PLATELET
BASO%: 0.1 % (ref 0.0–2.0)
EOS%: 0 % (ref 0.0–7.0)
HCT: 38.2 % (ref 34.8–46.6)
LYMPH%: 6.2 % — ABNORMAL LOW (ref 14.0–49.7)
MCH: 32.4 pg (ref 25.1–34.0)
MCHC: 35.3 g/dL (ref 31.5–36.0)
NEUT%: 90 % — ABNORMAL HIGH (ref 38.4–76.8)
Platelets: 242 10*3/uL (ref 145–400)
lymph#: 1.6 10*3/uL (ref 0.9–3.3)

## 2012-05-24 ENCOUNTER — Telehealth: Payer: Self-pay | Admitting: Internal Medicine

## 2012-05-24 NOTE — Telephone Encounter (Signed)
Noted. Agree thanks.  

## 2012-05-24 NOTE — Telephone Encounter (Signed)
Caller: Janice Martin/Patient; PCP: Rene Paci; CB#: [phone];  Call regarding L. Eye Is Puffy, itchy, Feels Gritty- Clear sticky Discharge From Eye- Onset 05/08/12; Afebrile, no other sx. Triage and Care advice per UJW:[id number] or Irritation Protocol and Polytrim Eye Drops 2 drops to both eyes QID x 5 days, no refills called into [facility] on [address] [phone] - per standing order. Spoke to [place], [place]. Advised pt to call back if sx not improved within 3 days.

## 2012-05-27 ENCOUNTER — Other Ambulatory Visit (HOSPITAL_BASED_OUTPATIENT_CLINIC_OR_DEPARTMENT_OTHER): Payer: 59 | Admitting: Lab

## 2012-05-27 DIAGNOSIS — D693 Immune thrombocytopenic purpura: Secondary | ICD-10-CM

## 2012-05-27 LAB — CBC WITH DIFFERENTIAL/PLATELET
EOS%: 1.4 % (ref 0.0–7.0)
MCH: 32.1 pg (ref 25.1–34.0)
MCV: 93.1 fL (ref 79.5–101.0)
MONO%: 5.9 % (ref 0.0–14.0)
RBC: 4.05 10*6/uL (ref 3.70–5.45)
RDW: 15.6 % — ABNORMAL HIGH (ref 11.2–14.5)
nRBC: 0 % (ref 0–0)

## 2012-05-27 LAB — TECHNOLOGIST REVIEW

## 2012-06-03 ENCOUNTER — Other Ambulatory Visit (HOSPITAL_BASED_OUTPATIENT_CLINIC_OR_DEPARTMENT_OTHER): Payer: 59 | Admitting: Lab

## 2012-06-03 DIAGNOSIS — D693 Immune thrombocytopenic purpura: Secondary | ICD-10-CM

## 2012-06-03 LAB — CBC WITH DIFFERENTIAL/PLATELET
BASO%: 0.5 % (ref 0.0–2.0)
EOS%: 0.9 % (ref 0.0–7.0)
HCT: 38.8 % (ref 34.8–46.6)
MCH: 31.8 pg (ref 25.1–34.0)
MCHC: 34.5 g/dL (ref 31.5–36.0)
MONO%: 5.8 % (ref 0.0–14.0)
NEUT%: 63 % (ref 38.4–76.8)
lymph#: 5.7 10*3/uL — ABNORMAL HIGH (ref 0.9–3.3)

## 2012-06-10 ENCOUNTER — Other Ambulatory Visit: Payer: 59 | Admitting: Lab

## 2012-06-10 ENCOUNTER — Ambulatory Visit: Payer: 59 | Admitting: Hematology & Oncology

## 2012-06-11 ENCOUNTER — Other Ambulatory Visit (HOSPITAL_BASED_OUTPATIENT_CLINIC_OR_DEPARTMENT_OTHER): Payer: 59 | Admitting: Lab

## 2012-06-11 ENCOUNTER — Ambulatory Visit (HOSPITAL_BASED_OUTPATIENT_CLINIC_OR_DEPARTMENT_OTHER): Payer: 59 | Admitting: Hematology & Oncology

## 2012-06-11 VITALS — BP 164/96 | HR 92 | Temp 97.3°F | Resp 20 | Ht 64.0 in | Wt 187.0 lb

## 2012-06-11 DIAGNOSIS — D693 Immune thrombocytopenic purpura: Secondary | ICD-10-CM

## 2012-06-11 LAB — CBC WITH DIFFERENTIAL (CANCER CENTER ONLY)
BASO%: 0.2 % (ref 0.0–2.0)
EOS%: 0.1 % (ref 0.0–7.0)
LYMPH%: 15.2 % (ref 14.0–48.0)
MCH: 33 pg (ref 26.0–34.0)
MCHC: 35.5 g/dL (ref 32.0–36.0)
MCV: 93 fL (ref 81–101)
MONO#: 1.8 10*3/uL — ABNORMAL HIGH (ref 0.1–0.9)
MONO%: 5.8 % (ref 0.0–13.0)
Platelets: 427 10*3/uL — ABNORMAL HIGH (ref 145–400)
RDW: 14.9 % (ref 11.1–15.7)
WBC: 31.4 10*3/uL — ABNORMAL HIGH (ref 3.9–10.0)

## 2012-06-11 LAB — TECHNOLOGIST REVIEW CHCC SATELLITE

## 2012-06-11 NOTE — Progress Notes (Signed)
CC:   Janice A. Felicity Coyer, MD  DIAGNOSIS:  Refractory immune thrombocytopenia.  CURRENT THERAPY:  Intermittent Decadron.  INTERIM HISTORY:  [name] comes in for followup.  Again, her platelet count has been dropping.  Again, she always responds to Decadron.  We got her on a Decadron taper that seems to keep her platelet count up. We do have her set up with Rituxan.  I think Rituxan should work.  I think that with Decadron working the Rituxan can hopefully provide some long-term control of her thrombocytopenia.  She is doing better with respect to work.  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  She has had no bleeding.  She has had no bruising.  There has been no change in bowel or bladder habits.  She is getting ready to go to the beach this weekend.  She is looking forward to this.  PHYSICAL EXAMINATION:  General:  This is a well-developed, well- nourished white female in no obvious distress.  Vital signs:  97.3, pulse 92, respiratory rate 20, blood pressure 164/96.  Weight is 187. Head and neck:  Shows a normocephalic, atraumatic skull.  There are no ocular or oral lesions.  There are no palpable cervical or supraclavicular lymph nodes.  Lungs:  Clear bilaterally.  Cardiac: Regular rate and rhythm with a normal S1 and S2.  There are no murmurs or rubs or bruits.  Abdomen:  Soft with good bowel sounds.  There is no palpable abdominal mass.  There is no palpable hepatosplenomegaly. Back:  No tenderness over the spine, ribs or hips.  Extremities:  Show no clubbing, cyanosis or edema.  Skin:  No rashes, ecchymoses or petechiae.  LABORATORY STUDIES:  White cell count is 31, hemoglobin 13.7, hematocrit 38.6, platelet count is 427.  IMPRESSION:  Ms. Pick is a 51 year old white female with refractory immune thrombocytopenia.  We are going to get her started on Rituxan.  I will start this after Labor Day.  We will get her weekly for 4 weeks. We  will check her platelet count every 3 weeks.  I suspect that the Rituxan probably will not work probably until after we complete it.  Again, we will see her back ourselves the 4th week of Rituxan.    ______________________________ Josph Macho, M.D. PRE/MEDQ  D:  06/11/2012  T:  06/11/2012  Job:  [id number]

## 2012-06-11 NOTE — Progress Notes (Signed)
This office note has been dictated.

## 2012-06-25 ENCOUNTER — Other Ambulatory Visit (HOSPITAL_BASED_OUTPATIENT_CLINIC_OR_DEPARTMENT_OTHER): Payer: 59 | Admitting: Lab

## 2012-06-25 ENCOUNTER — Ambulatory Visit (HOSPITAL_BASED_OUTPATIENT_CLINIC_OR_DEPARTMENT_OTHER): Payer: 59

## 2012-06-25 ENCOUNTER — Ambulatory Visit: Payer: 59

## 2012-06-25 ENCOUNTER — Other Ambulatory Visit: Payer: 59

## 2012-06-25 ENCOUNTER — Encounter: Payer: Self-pay | Admitting: Hematology & Oncology

## 2012-06-25 VITALS — BP 158/104 | HR 117 | Temp 96.7°F | Resp 20

## 2012-06-25 DIAGNOSIS — Z5112 Encounter for antineoplastic immunotherapy: Secondary | ICD-10-CM

## 2012-06-25 DIAGNOSIS — D693 Immune thrombocytopenic purpura: Secondary | ICD-10-CM

## 2012-06-25 LAB — CBC WITH DIFFERENTIAL (CANCER CENTER ONLY)
BASO#: 0.1 10*3/uL (ref 0.0–0.2)
Eosinophils Absolute: 0.2 10*3/uL (ref 0.0–0.5)
HCT: 35.7 % (ref 34.8–46.6)
HGB: 12.4 g/dL (ref 11.6–15.9)
LYMPH%: 20.5 % (ref 14.0–48.0)
MCH: 33.4 pg (ref 26.0–34.0)
MCV: 96 fL (ref 81–101)
MONO#: 1.2 10*3/uL — ABNORMAL HIGH (ref 0.1–0.9)
MONO%: 9.3 % (ref 0.0–13.0)
NEUT%: 67.8 % (ref 39.6–80.0)
RBC: 3.71 10*6/uL (ref 3.70–5.32)
WBC: 13.2 10*3/uL — ABNORMAL HIGH (ref 3.9–10.0)

## 2012-06-25 MED ORDER — SODIUM CHLORIDE 0.9 % IV SOLN: 375.0000 mg/m2 | Freq: Once | INTRAVENOUS | Status: DC

## 2012-06-25 MED ORDER — DIPHENHYDRAMINE HCL 25 MG PO CAPS
50.0000 mg | ORAL_CAPSULE | Freq: Once | ORAL | Status: AC
  Administered 2012-06-25: 50 mg via ORAL

## 2012-06-25 MED ORDER — RITUXIMAB CHEMO INJECTION 10 MG/ML
375.0000 mg/m2 | Freq: Once | INTRAVENOUS | Status: AC
  Administered 2012-06-25: 700 mg via INTRAVENOUS
  Filled 2012-06-25: qty 70

## 2012-06-25 MED ORDER — SODIUM CHLORIDE 0.9 % IV SOLN
Freq: Once | INTRAVENOUS | Status: AC
  Administered 2012-06-25: 10:00:00 via INTRAVENOUS

## 2012-06-25 MED ORDER — ACETAMINOPHEN 325 MG PO TABS
650.0000 mg | ORAL_TABLET | Freq: Once | ORAL | Status: AC
  Administered 2012-06-25: 650 mg via ORAL

## 2012-06-25 NOTE — Patient Instructions (Signed)

## 2012-07-02 ENCOUNTER — Ambulatory Visit (HOSPITAL_BASED_OUTPATIENT_CLINIC_OR_DEPARTMENT_OTHER): Payer: 59

## 2012-07-02 ENCOUNTER — Other Ambulatory Visit (HOSPITAL_BASED_OUTPATIENT_CLINIC_OR_DEPARTMENT_OTHER): Payer: 59 | Admitting: Lab

## 2012-07-02 VITALS — BP 167/97 | HR 91 | Temp 97.1°F | Resp 18

## 2012-07-02 DIAGNOSIS — D693 Immune thrombocytopenic purpura: Secondary | ICD-10-CM

## 2012-07-02 DIAGNOSIS — Z5112 Encounter for antineoplastic immunotherapy: Secondary | ICD-10-CM

## 2012-07-02 LAB — CBC WITH DIFFERENTIAL (CANCER CENTER ONLY)
BASO%: 0.6 % (ref 0.0–2.0)
HCT: 36.5 % (ref 34.8–46.6)
LYMPH%: 21.5 % (ref 14.0–48.0)
MCH: 32.8 pg (ref 26.0–34.0)
MCV: 96 fL (ref 81–101)
MONO#: 1 10*3/uL — ABNORMAL HIGH (ref 0.1–0.9)
MONO%: 9.1 % (ref 0.0–13.0)
NEUT%: 64.2 % (ref 39.6–80.0)
Platelets: 198 10*3/uL (ref 145–400)
RDW: 15.2 % (ref 11.1–15.7)

## 2012-07-02 LAB — TECHNOLOGIST REVIEW CHCC SATELLITE

## 2012-07-02 MED ORDER — ACETAMINOPHEN 325 MG PO TABS
650.0000 mg | ORAL_TABLET | Freq: Once | ORAL | Status: AC
  Administered 2012-07-02: 650 mg via ORAL

## 2012-07-02 MED ORDER — DIPHENHYDRAMINE HCL 25 MG PO CAPS
50.0000 mg | ORAL_CAPSULE | Freq: Once | ORAL | Status: AC
  Administered 2012-07-02: 50 mg via ORAL

## 2012-07-02 MED ORDER — SODIUM CHLORIDE 0.9 % IV SOLN
Freq: Once | INTRAVENOUS | Status: AC
  Administered 2012-07-02: 10:00:00 via INTRAVENOUS

## 2012-07-02 MED ORDER — SODIUM CHLORIDE 0.9 % IV SOLN
375.0000 mg/m2 | Freq: Once | INTRAVENOUS | Status: AC
  Administered 2012-07-02: 700 mg via INTRAVENOUS
  Filled 2012-07-02: qty 70

## 2012-07-02 NOTE — Patient Instructions (Signed)

## 2012-07-09 ENCOUNTER — Ambulatory Visit (HOSPITAL_BASED_OUTPATIENT_CLINIC_OR_DEPARTMENT_OTHER): Payer: 59

## 2012-07-09 ENCOUNTER — Other Ambulatory Visit (HOSPITAL_BASED_OUTPATIENT_CLINIC_OR_DEPARTMENT_OTHER): Payer: 59 | Admitting: Lab

## 2012-07-09 VITALS — BP 160/88 | HR 81 | Temp 96.7°F | Resp 20

## 2012-07-09 DIAGNOSIS — Z5112 Encounter for antineoplastic immunotherapy: Secondary | ICD-10-CM

## 2012-07-09 DIAGNOSIS — D693 Immune thrombocytopenic purpura: Secondary | ICD-10-CM

## 2012-07-09 LAB — CBC WITH DIFFERENTIAL (CANCER CENTER ONLY)
BASO#: 0.1 10*3/uL (ref 0.0–0.2)
EOS%: 4.8 % (ref 0.0–7.0)
Eosinophils Absolute: 0.5 10*3/uL (ref 0.0–0.5)
HGB: 13.2 g/dL (ref 11.6–15.9)
LYMPH#: 2.6 10*3/uL (ref 0.9–3.3)
NEUT#: 6.5 10*3/uL (ref 1.5–6.5)
RBC: 4.04 10*6/uL (ref 3.70–5.32)

## 2012-07-09 MED ORDER — SODIUM CHLORIDE 0.9 % IV SOLN
375.0000 mg/m2 | Freq: Once | INTRAVENOUS | Status: AC
  Administered 2012-07-09: 700 mg via INTRAVENOUS
  Filled 2012-07-09: qty 70

## 2012-07-09 MED ORDER — ACETAMINOPHEN 325 MG PO TABS
650.0000 mg | ORAL_TABLET | Freq: Once | ORAL | Status: AC
  Administered 2012-07-09: 650 mg via ORAL

## 2012-07-09 MED ORDER — SODIUM CHLORIDE 0.9 % IV SOLN
Freq: Once | INTRAVENOUS | Status: AC
  Administered 2012-07-09: 10:00:00 via INTRAVENOUS

## 2012-07-09 MED ORDER — DIPHENHYDRAMINE HCL 25 MG PO CAPS
50.0000 mg | ORAL_CAPSULE | Freq: Once | ORAL | Status: AC
  Administered 2012-07-09: 50 mg via ORAL

## 2012-07-09 NOTE — Patient Instructions (Addendum)

## 2012-07-16 ENCOUNTER — Ambulatory Visit (HOSPITAL_BASED_OUTPATIENT_CLINIC_OR_DEPARTMENT_OTHER): Payer: 59

## 2012-07-16 ENCOUNTER — Ambulatory Visit (HOSPITAL_BASED_OUTPATIENT_CLINIC_OR_DEPARTMENT_OTHER): Payer: 59 | Admitting: Hematology & Oncology

## 2012-07-16 ENCOUNTER — Other Ambulatory Visit (HOSPITAL_BASED_OUTPATIENT_CLINIC_OR_DEPARTMENT_OTHER): Payer: 59 | Admitting: Lab

## 2012-07-16 VITALS — BP 172/85 | HR 98 | Temp 97.9°F | Resp 18 | Ht 64.0 in | Wt 194.0 lb

## 2012-07-16 VITALS — BP 173/96 | HR 87 | Temp 97.3°F | Resp 20

## 2012-07-16 DIAGNOSIS — D693 Immune thrombocytopenic purpura: Secondary | ICD-10-CM

## 2012-07-16 DIAGNOSIS — Z5112 Encounter for antineoplastic immunotherapy: Secondary | ICD-10-CM

## 2012-07-16 LAB — CBC WITH DIFFERENTIAL (CANCER CENTER ONLY)
Eosinophils Absolute: 2.4 10*3/uL — ABNORMAL HIGH (ref 0.0–0.5)
HCT: 38.1 % (ref 34.8–46.6)
LYMPH%: 19.4 % (ref 14.0–48.0)
MCV: 93 fL (ref 81–101)
MONO#: 1.2 10*3/uL — ABNORMAL HIGH (ref 0.1–0.9)
Platelets: 187 10*3/uL (ref 145–400)
RBC: 4.08 10*6/uL (ref 3.70–5.32)
WBC: 14 10*3/uL — ABNORMAL HIGH (ref 3.9–10.0)

## 2012-07-16 MED ORDER — ACETAMINOPHEN 325 MG PO TABS
650.0000 mg | ORAL_TABLET | Freq: Once | ORAL | Status: AC
  Administered 2012-07-16: 650 mg via ORAL

## 2012-07-16 MED ORDER — SODIUM CHLORIDE 0.9 % IV SOLN
375.0000 mg/m2 | Freq: Once | INTRAVENOUS | Status: AC
  Administered 2012-07-16: 700 mg via INTRAVENOUS
  Filled 2012-07-16: qty 70

## 2012-07-16 MED ORDER — DIPHENHYDRAMINE HCL 25 MG PO CAPS
50.0000 mg | ORAL_CAPSULE | Freq: Once | ORAL | Status: AC
  Administered 2012-07-16: 50 mg via ORAL

## 2012-07-16 MED ORDER — SODIUM CHLORIDE 0.9 % IV SOLN
Freq: Once | INTRAVENOUS | Status: AC
  Administered 2012-07-16: 10:00:00 via INTRAVENOUS

## 2012-07-16 NOTE — Progress Notes (Signed)
This office note has been dictated.

## 2012-07-16 NOTE — Patient Instructions (Signed)
Call if bleeding

## 2012-07-16 NOTE — Patient Instructions (Addendum)

## 2012-07-16 NOTE — Progress Notes (Signed)
CC:   Janice A. Felicity Coyer, MD  DIAGNOSIS:  Refractory immune thrombocytopenia.  CURRENT THERAPY:  The patient is to complete her 4th week of her first cycle of Rituxan today.  INTERIM HISTORY:  Janice Martin comes in for followup.  She has done well with the Rituxan.  Her platelet count has come up quite nicely.  She has had some fatigue. There is no nausea.  She really rests on the weekend.  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  She has had no cough.  There has been no headache.  She has had no bony pain.  There has been no diarrhea.  She has had no weight loss or weight gain.  PHYSICAL EXAMINATION:  This is a well-developed, well-nourished white female in no obvious distress.  Vital signs:  97.9, pulse 98, respiratory rate 18, blood pressure 172/85.  Weight is 194 pounds.  Head and neck:  Normocephalic, atraumatic skull.  There are no ocular or oral lesions.  There are no palpable cervical or supraclavicular lymph nodes. Lungs:  Clear bilaterally.  Cardiac:  Regular rate and rhythm with a normal S1, S2.  No murmurs, rubs or bruits.  Abdomen:  Shows a laparotomy scar from the splenectomy.  She has no fluid wave.  No guarding or rebound tenderness is noted.  No palpable hepatomegaly. Back:  No tenderness over the spine, ribs, or hips.  Extremities:  No clubbing, cyanosis or edema.  Skin:  No rashes, ecchymosis or petechia.  LABORATORY STUDIES:  White cell count 14, hemoglobin 13.2, hematocrit 38.1, platelet count 187.  IMPRESSION:  Janice Martin is a 51 year old white female with refractory immune thrombocytopenia.  She is on Rituxan.  I still think it might be a little too early to know if Rituxan is helping.  I have to believe that will help.  Typically, Rituxan has a delayed onset of effect.  We will continue to follow her blood count weekly.  I will get her back in 1 month for followup.  I am just glad that she has done so well  with the Rituxan and has tolerated nicely.    ______________________________ Josph Macho, M.D. PRE/MEDQ  D:  07/16/2012  T:  07/16/2012  Job:  [id number]

## 2012-08-20 ENCOUNTER — Ambulatory Visit (HOSPITAL_BASED_OUTPATIENT_CLINIC_OR_DEPARTMENT_OTHER): Payer: 59 | Admitting: Hematology & Oncology

## 2012-08-20 ENCOUNTER — Other Ambulatory Visit (HOSPITAL_BASED_OUTPATIENT_CLINIC_OR_DEPARTMENT_OTHER): Payer: 59 | Admitting: Lab

## 2012-08-20 VITALS — BP 150/63 | HR 96 | Temp 97.9°F | Resp 18 | Ht 64.0 in | Wt 191.0 lb

## 2012-08-20 DIAGNOSIS — D693 Immune thrombocytopenic purpura: Secondary | ICD-10-CM

## 2012-08-20 LAB — CBC WITH DIFFERENTIAL (CANCER CENTER ONLY)
BASO#: 0.1 10*3/uL (ref 0.0–0.2)
BASO%: 0.8 % (ref 0.0–2.0)
Eosinophils Absolute: 2.5 10*3/uL — ABNORMAL HIGH (ref 0.0–0.5)
HCT: 38.3 % (ref 34.8–46.6)
HGB: 13.3 g/dL (ref 11.6–15.9)
LYMPH%: 18.2 % (ref 14.0–48.0)
MCV: 93 fL (ref 81–101)
MONO#: 1.3 10*3/uL — ABNORMAL HIGH (ref 0.1–0.9)
NEUT%: 55.1 % (ref 39.6–80.0)
RDW: 13.1 % (ref 11.1–15.7)
WBC: 14.6 10*3/uL — ABNORMAL HIGH (ref 3.9–10.0)

## 2012-08-20 NOTE — Progress Notes (Signed)
CC:   Janice A. Felicity Coyer, MD  DIAGNOSIS:  Refractory immune thrombocytopenia.  CURRENT THERAPY:  The patient is status post Rituxan.  INTERIM HISTORY:  Janice Martin comes in for followup.  She is doing okay. Her mother, unfortunately, is in the hospital.  She has been in the hospital for 3 weeks.  She had diverticulitis and intestinal perforation.  She is in the ICU.  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  After work, she goes to see her mom.  As such, she is pretty stressed out.  Overall, she has tolerated the Rituxan well.  She has had no problems with bleeding.  She has had no fevers, sweats, or chills.  She has had no ecchymoses.  She has had no nausea or vomiting.  There has been no change in bowel or bladder habits.  PHYSICAL EXAMINATION:  General:  This is a well-developed, well- nourished white female, in no obvious distress.  Vital signs: Temperature of 97.9, pulse 96, respiratory rate 18, blood pressure 150/63.  Weight is 191.  Head and neck:  Normocephalic, atraumatic skull.  There are no ocular or oral lesions.  There are no palpable cervical or supraclavicular lymph nodes.  Lungs:  Clear bilaterally. Cardiac:  Regular rate and rhythm, with a normal S1 and S2.  There are no murmurs, rubs or bruits.  Abdomen:  Soft, with good bowel sounds. She has a well-healed laparotomy scar.  There is no fluid wave.  There is no guarding or rebound tenderness.  There is no palpable hepatomegaly.  Extremities:  No clubbing, cyanosis, or edema.  Skin:  No rashes, ecchymosis, or petechiae.  LABORATORY STUDIES:  White cell count 14.6, hemoglobin 13.1, hematocrit 39, platelet count 327,000.  IMPRESSION:  Janice Martin is a 51 year old white female with refractory immune thrombocytopenia.  She is doing fairly well.  She completed the Rituxan probably about 1 month ago.  Her last dose was back on 07/16/2012.  Hopefully, we will see a sustained response.  I think we can  probably check her platelet counts monthly now.  I will plan to see her back myself in 3 months.  Janice Martin certainly knows when there are problems with her platelets, if they go low.  As such, she will let us know if we do need to do any blood work.    ______________________________ Josph Macho, M.D. PRE/MEDQ  D:  08/20/2012  T:  08/20/2012  Job:  [id number]

## 2012-08-20 NOTE — Progress Notes (Signed)
This office note has been dictated.

## 2012-09-17 ENCOUNTER — Other Ambulatory Visit: Payer: 59 | Admitting: Lab

## 2012-10-15 ENCOUNTER — Other Ambulatory Visit (HOSPITAL_BASED_OUTPATIENT_CLINIC_OR_DEPARTMENT_OTHER): Payer: 59 | Admitting: Lab

## 2012-10-15 DIAGNOSIS — D693 Immune thrombocytopenic purpura: Secondary | ICD-10-CM

## 2012-10-15 LAB — CBC WITH DIFFERENTIAL/PLATELET
BASO%: 1.7 % (ref 0.0–2.0)
Basophils Absolute: 0.2 10*3/uL — ABNORMAL HIGH (ref 0.0–0.1)
EOS%: 10.1 % — ABNORMAL HIGH (ref 0.0–7.0)
HCT: 39.5 % (ref 34.8–46.6)
HGB: 13.3 g/dL (ref 11.6–15.9)
LYMPH%: 19 % (ref 14.0–49.7)
MCH: 31.5 pg (ref 25.1–34.0)
MCHC: 33.7 g/dL (ref 31.5–36.0)
MCV: 93.5 fL (ref 79.5–101.0)
MONO%: 10.5 % (ref 0.0–14.0)
NEUT%: 58.7 % (ref 38.4–76.8)
Platelets: 335 10*3/uL (ref 145–400)

## 2012-11-04 ENCOUNTER — Telehealth: Payer: Self-pay | Admitting: Hematology & Oncology

## 2012-11-04 NOTE — Telephone Encounter (Signed)
Pt moved 1-24 to 1-29

## 2012-11-12 ENCOUNTER — Ambulatory Visit: Payer: 59 | Admitting: Hematology & Oncology

## 2012-11-12 ENCOUNTER — Other Ambulatory Visit: Payer: 59 | Admitting: Lab

## 2012-11-16 ENCOUNTER — Telehealth: Payer: Self-pay | Admitting: Hematology & Oncology

## 2012-11-16 NOTE — Telephone Encounter (Signed)
Patient called and cx 11/17/12 apt due to not feeling well.  She stated she will call back to resch

## 2012-11-17 ENCOUNTER — Other Ambulatory Visit: Payer: 59 | Admitting: Lab

## 2012-11-17 ENCOUNTER — Ambulatory Visit: Payer: 59 | Admitting: Medical

## 2012-12-04 ENCOUNTER — Other Ambulatory Visit: Payer: Self-pay

## 2012-12-14 ENCOUNTER — Other Ambulatory Visit: Payer: 59 | Admitting: Lab

## 2012-12-14 ENCOUNTER — Ambulatory Visit: Payer: 59 | Admitting: Hematology & Oncology

## 2013-01-20 ENCOUNTER — Encounter: Payer: 59 | Admitting: Internal Medicine

## 2013-02-18 ENCOUNTER — Ambulatory Visit (INDEPENDENT_AMBULATORY_CARE_PROVIDER_SITE_OTHER): Payer: 59 | Admitting: Internal Medicine

## 2013-02-18 ENCOUNTER — Encounter: Payer: Self-pay | Admitting: Internal Medicine

## 2013-02-18 ENCOUNTER — Other Ambulatory Visit (INDEPENDENT_AMBULATORY_CARE_PROVIDER_SITE_OTHER): Payer: 59

## 2013-02-18 VITALS — BP 142/98 | HR 89 | Temp 97.9°F | Ht 64.0 in | Wt 189.2 lb

## 2013-02-18 DIAGNOSIS — Z0000 Encounter for general adult medical examination without abnormal findings: Secondary | ICD-10-CM

## 2013-02-18 DIAGNOSIS — I10 Essential (primary) hypertension: Secondary | ICD-10-CM

## 2013-02-18 DIAGNOSIS — Z1211 Encounter for screening for malignant neoplasm of colon: Secondary | ICD-10-CM

## 2013-02-18 LAB — URINALYSIS, ROUTINE W REFLEX MICROSCOPIC
Nitrite: NEGATIVE
Specific Gravity, Urine: 1.025 (ref 1.000–1.030)
Urobilinogen, UA: 0.2 (ref 0.0–1.0)
pH: 6 (ref 5.0–8.0)

## 2013-02-18 LAB — CBC WITH DIFFERENTIAL/PLATELET
Basophils Absolute: 0 10*3/uL (ref 0.0–0.1)
Basophils Relative: 0.5 % (ref 0.0–3.0)
Eosinophils Absolute: 0.4 10*3/uL (ref 0.0–0.7)
Lymphocytes Relative: 25.3 % (ref 12.0–46.0)
MCHC: 34.4 g/dL (ref 30.0–36.0)
Monocytes Relative: 9.1 % (ref 3.0–12.0)
Neutrophils Relative %: 60.2 % (ref 43.0–77.0)
RBC: 4.36 Mil/uL (ref 3.87–5.11)
RDW: 13.8 % (ref 11.5–14.6)

## 2013-02-18 LAB — TSH: TSH: 0.55 u[IU]/mL (ref 0.35–5.50)

## 2013-02-18 MED ORDER — VALSARTAN-HYDROCHLOROTHIAZIDE 320-25 MG PO TABS: 1.0000 | ORAL_TABLET | Freq: Every day | ORAL | Status: DC

## 2013-02-18 MED ORDER — NEBIVOLOL HCL 5 MG PO TABS: 5.0000 mg | ORAL_TABLET | Freq: Every day | ORAL | Status: DC

## 2013-02-18 NOTE — Patient Instructions (Signed)
It was good to see you today. We have reviewed your prior records including labs and tests today Health Maintenance reviewed - all recommended immunizations and age-appropriate screenings are up-to-date.  we'll make referral to GI for screening colonoscopy . Our office will contact you regarding appointment(s) once made. Test(s) ordered today. Your results will be released to MyChart (or called to you) after review, usually within 72hours after test completion. If any changes need to be made, you will be notified at that same time. Medications reviewed and updated, takeBystolic everyday in addition to valsartan HCT for blood pressure control -no other changes recommended at this time. Refill on medication(s) as discussed today. Please schedule followup in 12 months for medical physical and labs, call sooner if problems.  Health Maintenance, Females A healthy lifestyle and preventative care can promote health and wellness.  Maintain regular health, dental, and eye exams.  Eat a healthy diet. Foods like vegetables, fruits, whole grains, low-fat dairy products, and lean protein foods contain the nutrients you need without too many calories. Decrease your intake of foods high in solid fats, added sugars, and salt. Get information about a proper diet from your caregiver, if necessary.  Regular physical exercise is one of the most important things you can do for your health. Most adults should get at least 150 minutes of moderate-intensity exercise (any activity that increases your heart rate and causes you to sweat) each week. In addition, most adults need muscle-strengthening exercises on 2 or more days a week.   Maintain a healthy weight. The body mass index (BMI) is a screening tool to identify possible weight problems. It provides an estimate of body fat based on height and weight. Your caregiver can help determine your BMI, and can help you achieve or maintain a healthy weight. For adults 20 years  and older:  A BMI below 18.5 is considered underweight.  A BMI of 18.5 to 24.9 is normal.  A BMI of 25 to 29.9 is considered overweight.  A BMI of 30 and above is considered obese.  Maintain normal blood lipids and cholesterol by exercising and minimizing your intake of saturated fat. Eat a balanced diet with plenty of fruits and vegetables. Blood tests for lipids and cholesterol should begin at age 22 and be repeated every 5 years. If your lipid or cholesterol levels are high, you are over 50, or you are a high risk for heart disease, you may need your cholesterol levels checked more frequently.Ongoing high lipid and cholesterol levels should be treated with medicines if diet and exercise are not effective.  If you smoke, find out from your caregiver how to quit. If you do not use tobacco, do not start.  If you are pregnant, do not drink alcohol. If you are breastfeeding, be very cautious about drinking alcohol. If you are not pregnant and choose to drink alcohol, do not exceed 1 drink per day. One drink is considered to be 12 ounces (355 mL) of beer, 5 ounces (148 mL) of wine, or 1.5 ounces (44 mL) of liquor.  Avoid use of street drugs. Do not share needles with anyone. Ask for help if you need support or instructions about stopping the use of drugs.  High blood pressure causes heart disease and increases the risk of stroke. Blood pressure should be checked at least every 1 to 2 years. Ongoing high blood pressure should be treated with medicines, if weight loss and exercise are not effective.  If you are 55 to 50  years old, ask your caregiver if you should take aspirin to prevent strokes.  Diabetes screening involves taking a blood sample to check your fasting blood sugar level. This should be done once every 3 years, after age 57, if you are within normal weight and without risk factors for diabetes. Testing should be considered at a younger age or be carried out more frequently if you are  overweight and have at least 1 risk factor for diabetes.  Breast cancer screening is essential preventative care for women. You should practice "breast self-awareness." This means understanding the normal appearance and feel of your breasts and may include breast self-examination. Any changes detected, no matter how small, should be reported to a caregiver. Women in their 80s and 30s should have a clinical breast exam (CBE) by a caregiver as part of a regular health exam every 1 to 3 years. After age 74, women should have a CBE every year. Starting at age 38, women should consider having a mammogram (breast X-ray) every year. Women who have a family history of breast cancer should talk to their caregiver about genetic screening. Women at a high risk of breast cancer should talk to their caregiver about having an MRI and a mammogram every year.  The Pap test is a screening test for cervical cancer. Women should have a Pap test starting at age 58. Between ages 21 and 7, Pap tests should be repeated every 2 years. Beginning at age 67, you should have a Pap test every 3 years as long as the past 3 Pap tests have been normal. If you had a hysterectomy for a problem that was not cancer or a condition that could lead to cancer, then you no longer need Pap tests. If you are between ages 40 and 55, and you have had normal Pap tests going back 10 years, you no longer need Pap tests. If you have had past treatment for cervical cancer or a condition that could lead to cancer, you need Pap tests and screening for cancer for at least 20 years after your treatment. If Pap tests have been discontinued, risk factors (such as a new sexual partner) need to be reassessed to determine if screening should be resumed. Some women have medical problems that increase the chance of getting cervical cancer. In these cases, your caregiver may recommend more frequent screening and Pap tests.  The human papillomavirus (HPV) test is an  additional test that may be used for cervical cancer screening. The HPV test looks for the virus that can cause the cell changes on the cervix. The cells collected during the Pap test can be tested for HPV. The HPV test could be used to screen women aged 68 years and older, and should be used in women of any age who have unclear Pap test results. After the age of 75, women should have HPV testing at the same frequency as a Pap test.  Colorectal cancer can be detected and often prevented. Most routine colorectal cancer screening begins at the age of 75 and continues through age 34. However, your caregiver may recommend screening at an earlier age if you have risk factors for colon cancer. On a yearly basis, your caregiver may provide home test kits to check for hidden blood in the stool. Use of a small camera at the end of a tube, to directly examine the colon (sigmoidoscopy or colonoscopy), can detect the earliest forms of colorectal cancer. Talk to your caregiver about this at age 68,  when routine screening begins. Direct examination of the colon should be repeated every 5 to 10 years through age 78, unless early forms of pre-cancerous polyps or small growths are found.  Hepatitis C blood testing is recommended for all people born from [birth year] through 1963 and any individual with known risks for hepatitis C.  Practice safe sex. Use condoms and avoid high-risk sexual practices to reduce the spread of sexually transmitted infections (STIs). Sexually active women aged 53 and younger should be checked for Chlamydia, which is a common sexually transmitted infection. Older women with new or multiple partners should also be tested for Chlamydia. Testing for other STIs is recommended if you are sexually active and at increased risk.  Osteoporosis is a disease in which the bones lose minerals and strength with aging. This can result in serious bone fractures. The risk of osteoporosis can be identified using a bone  density scan. Women ages 20 and over and women at risk for fractures or osteoporosis should discuss screening with their caregivers. Ask your caregiver whether you should be taking a calcium supplement or vitamin D to reduce the rate of osteoporosis.  Menopause can be associated with physical symptoms and risks. Hormone replacement therapy is available to decrease symptoms and risks. You should talk to your caregiver about whether hormone replacement therapy is right for you.  Use sunscreen with a sun protection factor (SPF) of 30 or greater. Apply sunscreen liberally and repeatedly throughout the day. You should seek shade when your shadow is shorter than you. Protect yourself by wearing long sleeves, pants, a wide-brimmed hat, and sunglasses year round, whenever you are outdoors.  Notify your caregiver of new moles or changes in moles, especially if there is a change in shape or color. Also notify your caregiver if a mole is larger than the size of a pencil eraser.  Stay current with your immunizations. Document Released: 04/21/2011 Document Revised: 12/29/2011 Document Reviewed: 04/21/2011 [facility] Patient Information 2013 [place], [place].

## 2013-02-18 NOTE — Progress Notes (Signed)
Subjective:    Patient ID: Janice Martin, female    DOB: 07-04-1961, 52 y.o.   MRN: [id number]  HPI  Patient here for annual physical wellness exam.  No new complaints today and is doing well.  HTN:  Reports that she is taking her medications.  Had a problem with Benecar but it was too expensive so she is taking the generic.  She never started taking Bystolic.    Thrombocytopenia:  Followed by Dr. Myna Hidalgo.  Took Rituxin for same September 2013.  Had no problems with the medication.  Feels that for the time being she is "in remission".  Anxiety:  Is doing okay.  Work is stressful at the moment because she is taking on new responsibilities.  Is expecting a granddaughter soon, though daughter may lose job.  Is managing with stress "okay".          Past Medical History  Diagnosis Date  . ANEMIA-IRON DEFICIENCY   . Immune thrombocytopenic purpura   . LEUKOCYTOSIS UNSPECIFIED   . Anxiety state, unspecified   . HYPERTENSION    Family History  Problem Relation Age of Onset  . Arthritis Mother   . Arthritis Father   . Mental illness Father   . Hypertension Other     Parent  . Heart disease Other     Grandparent  . Stroke Other     grandparent        Review of Systems  Constitutional: Negative for fever, chills, activity change, appetite change and unexpected weight change.  Respiratory: Negative for cough, chest tightness, shortness of breath and wheezing.   Cardiovascular: Negative for chest pain, palpitations and leg swelling.  Gastrointestinal: Negative for nausea, vomiting, abdominal pain, diarrhea and constipation.  Neurological: Negative for dizziness, syncope, weakness and headaches.  All other systems reviewed and are negative.       Objective:   Physical Exam  Nursing note and vitals reviewed. Constitutional: She is oriented to person, place, and time. She appears well-developed and well-nourished.  HENT:  Head: Normocephalic and atraumatic.  Eyes:  Conjunctivae are normal. Right eye exhibits no discharge. Left eye exhibits no discharge. No scleral icterus.  Neck: Normal range of motion. Neck supple. No JVD present. Carotid bruit is not present. No thyromegaly present.  Cardiovascular: Normal rate, regular rhythm and normal heart sounds.  Exam reveals no gallop and no friction rub.   No murmur heard. Pulmonary/Chest: Effort normal and breath sounds normal. No respiratory distress. She has no wheezes. She has no rales. She exhibits no tenderness.  Musculoskeletal: Normal range of motion.  Lymphadenopathy:    She has no cervical adenopathy.  Neurological: She is alert and oriented to person, place, and time.  Skin: Skin is warm and dry.  Psychiatric: She has a normal mood and affect. Her behavior is normal.    Filed Vitals:   02/18/13 0818  BP: 142/98  Pulse: 89  Temp: 97.9 F (36.6 C)  TempSrc: Oral  Height: 5\' 4"  (1.626 m)  Weight: 189 lb 3.2 oz (85.821 kg)  SpO2: 96%           Assessment & Plan:  CPX/v70.0 - Patient here for annual physical exam.  Past medical history, family, history, medication list, and interval events reviewed.  Age appropriate health and wellness screenings were discussed and are up to date.  All immunizations are up to date or declined.  Referral to GI for screening colonoscopy.    Labs and ECG were ordered.  ECG was  discussed with patient.  HTN:  Patient to start taking Bystolic with Valsartan for BP.   I have personally reviewed this case with PA student. I also personally examined this patient. I agree with history and findings as documented above. I reviewed, discussed and approve of the assessment and plan as listed above. Rene Paci, MD

## 2013-02-18 NOTE — Progress Notes (Signed)
Subjective:    Patient ID: Janice Martin, female    DOB: [birth year], 52 y.o.   MRN: [id number]  HPI   patient is here today for annual physical. Patient feels well overall.  Also reviewed chronic medical issues: hypertension, ITP, anxiety  Past Medical History  Diagnosis Date  . ANEMIA-IRON DEFICIENCY   . Immune thrombocytopenic purpura 2003 dx    s/p rituxan 06/2012 - now remission  . LEUKOCYTOSIS UNSPECIFIED   . Anxiety state, unspecified   . HYPERTENSION    Family History  Problem Relation Age of Onset  . Arthritis Mother   . Arthritis Father   . Mental illness Father   . Hypertension Other     Parent  . Heart disease Other     Grandparent  . Stroke Other     grandparent   History  Substance Use Topics  . Smoking status: Never Smoker   . Smokeless tobacco: Not on file     Comment: Married, lives with spouse and son. works in [employer] division @ [employer] [facility]  . Alcohol Use: No   Review of Systems  Constitutional: Negative for fever or weight change.  Respiratory: Negative for cough and shortness of breath.   Cardiovascular: Negative for chest pain or palpitations.  Gastrointestinal: Negative for abdominal pain, no bowel changes.  Musculoskeletal: Negative for gait problem or joint swelling.  Skin: Negative for rash.  Neurological: Negative for dizziness or headache.  No other specific complaints in a complete review of systems (except as listed in HPI above).     Objective:   Physical Exam  BP 142/98  Pulse 89  Temp(Src) 97.9 F (36.6 C) (Oral)  Ht 5\' 4"  (1.626 m)  Wt 189 lb 3.2 oz (85.821 kg)  BMI 32.46 kg/m2  SpO2 96% Wt Readings from Last 3 Encounters:  02/18/13 189 lb 3.2 oz (85.821 kg)  08/20/12 191 lb (86.637 kg)  07/16/12 194 lb (87.998 kg)   Constitutional: She is overweight, but appears well-developed and well-nourished. No distress.  HENT: Head: Normocephalic and atraumatic. Ears: B TMs ok, no erythema or effusion; Nose: Nose normal.  Mouth/Throat: Oropharynx is clear and moist. No oropharyngeal exudate.  Eyes: Conjunctivae and EOM are normal. Pupils are equal, round, and reactive to light. No scleral icterus.  Neck: Normal range of motion. Neck supple. No JVD present. No thyromegaly present.  Cardiovascular: Normal rate, regular rhythm and normal heart sounds.  No murmur heard. No BLE edema. Pulmonary/Chest: Effort normal and breath sounds normal. No respiratory distress. She has no wheezes.  Abdominal: Soft. Bowel sounds are normal. She exhibits no distension. There is no tenderness. no masses Musculoskeletal: Normal range of motion, no joint effusions. No gross deformities Neurological: She is alert and oriented to person, place, and time. No cranial nerve deficit. Coordination normal.  Skin: Skin is warm and dry. No rash noted. No erythema.  Psychiatric: She has a normal mood and affect. Her behavior is normal. Judgment and thought content normal.   Lab Results  Component Value Date   WBC 14.6* 10/15/2012   HGB 13.3 10/15/2012   HCT 39.5 10/15/2012   PLT 335 10/15/2012   GLUCOSE 98 01/21/2012   CHOL 178 09/28/2007   HDL 49 09/28/2007   LDLCALC 117 09/28/2007   ALT 15 01/21/2012   AST 11 01/21/2012   NA 138 01/21/2012   K 4.6 01/21/2012   CL 102 01/21/2012   CREATININE 0.93 01/21/2012   BUN 24* 01/21/2012   CO2 25  01/21/2012   TSH 0.059* 01/21/2012   INR 0.9* 09/30/2010   EKG: NSR @ 92 bpm, no ST-T changes    Assessment & Plan:   CPX - v70.0 - Patient has been counseled on age-appropriate routine health concerns for screening and prevention. These are reviewed and up-to-date. Immunizations are up-to-date or declined. Labs ordered and ECG reviewed. Refer for colo age >50 screening   Also See problem list. Medications and labs reviewed today.

## 2013-02-18 NOTE — Assessment & Plan Note (Signed)
BP Readings from Last 3 Encounters:  02/18/13 142/98  08/20/12 150/63  07/16/12 173/96   Pt reports great variability, including good control at home Never began bystolic as rx'd - encouraged to take with ARB/hct the patient to continue to monitor and call if uncontrolled before next visit

## 2013-02-21 LAB — BASIC METABOLIC PANEL
CO2: 29 mEq/L (ref 19–32)
Calcium: 9.2 mg/dL (ref 8.4–10.5)
Creatinine, Ser: 1 mg/dL (ref 0.4–1.2)
GFR: 60.23 mL/min (ref >60.00)
Sodium: 138 mEq/L (ref 135–145)

## 2013-02-21 LAB — HEPATIC FUNCTION PANEL
ALT: 29 U/L (ref 0–35)
AST: 21 U/L (ref 0–37)
Albumin: 4.3 g/dL (ref 3.5–5.2)
Alkaline Phosphatase: 89 U/L (ref 39–117)

## 2013-02-21 LAB — LIPID PANEL
Cholesterol: 190 mg/dL (ref 0–200)
HDL: 44.2 mg/dL (ref >39.00)
Total CHOL/HDL Ratio: 4
Triglycerides: 89 mg/dL (ref 0.0–149.0)

## 2013-03-01 ENCOUNTER — Other Ambulatory Visit: Payer: 59 | Admitting: Lab

## 2013-03-01 ENCOUNTER — Other Ambulatory Visit (HOSPITAL_BASED_OUTPATIENT_CLINIC_OR_DEPARTMENT_OTHER): Payer: 59 | Admitting: Lab

## 2013-03-01 ENCOUNTER — Ambulatory Visit: Payer: 59 | Admitting: Hematology & Oncology

## 2013-03-01 ENCOUNTER — Ambulatory Visit (HOSPITAL_BASED_OUTPATIENT_CLINIC_OR_DEPARTMENT_OTHER): Payer: 59 | Admitting: Hematology & Oncology

## 2013-03-01 VITALS — BP 140/59 | HR 77 | Temp 97.9°F | Resp 16 | Ht 64.0 in | Wt 191.0 lb

## 2013-03-01 DIAGNOSIS — D693 Immune thrombocytopenic purpura: Secondary | ICD-10-CM

## 2013-03-01 LAB — CBC WITH DIFFERENTIAL (CANCER CENTER ONLY)
BASO#: 0.1 10*3/uL (ref 0.0–0.2)
BASO%: 1 % (ref 0.0–2.0)
EOS%: 5.7 % (ref 0.0–7.0)
MCH: 31.8 pg (ref 26.0–34.0)
MCHC: 34.3 g/dL (ref 32.0–36.0)
MONO%: 8.1 % (ref 0.0–13.0)
NEUT#: 5.1 10*3/uL (ref 1.5–6.5)
Platelets: 178 10*3/uL (ref 145–400)
RDW: 13.2 % (ref 11.1–15.7)

## 2013-03-01 NOTE — Progress Notes (Signed)
CC:   Valerie A. Felicity Coyer, MD  DIAGNOSIS:  Refractory immune thrombocytopenia.  CURRENT THERAPY:  Observation.  INTERIM HISTORY:  Ms. Papania comes in for followup.  She is doing quite well.  She had no real complaints.  We last saw her back in November.  She has had no bleeding or bruising.  She has had no fever, sweats or chills.  She has had no nausea or vomiting.  There has been no change in bowel or bladder habits.  Of note, she completed Rituxan back in September of 2013.  PHYSICAL EXAMINATION:  General:  This is a well-developed, well- nourished white female in no obvious distress.  Vital signs:  Show a temperature of 97.9, pulse 77, respiratory rate 16, blood pressure 140/59.  Weight is 191.  Head and neck:  Shows a normocephalic, atraumatic skull.  There are no ocular or oral lesions.  There are no palpable cervical or supraclavicular lymph nodes.  Lungs:  Clear bilaterally.  Cardiac:  Regular rate and rhythm with a normal S1, S2. There are no murmurs, rubs or bruits.  Abdomen:  Soft with good bowel sounds.  There is no palpable hepatomegaly.  She has a well-healed splenectomy scar.  Extremities:  Show no clubbing, cyanosis or edema. Skin:  No rash, ecchymosis or petechia.  Neurological:  Shows no focal neurological deficits.  LABORATORY STUDIES:  White cell count is 8.6, hemoglobin 13.2, hematocrit 38.5, platelet count is 178.  IMPRESSION:  Ms. Blackson is a very charming 52 year old white female with refractory immune thrombocytopenia.  She has done very well with Rituxan.  We will still follow along every 3 or 4 months.  I do not see that we have to do any blood work in-between visits.    ______________________________ Josph Macho, M.D. PRE/MEDQ  D:  03/01/2013  T:  03/01/2013  Job:  [id number]

## 2013-03-01 NOTE — Progress Notes (Signed)
This office note has been dictated.

## 2013-07-01 ENCOUNTER — Ambulatory Visit (HOSPITAL_BASED_OUTPATIENT_CLINIC_OR_DEPARTMENT_OTHER): Payer: 59 | Admitting: Hematology & Oncology

## 2013-07-01 ENCOUNTER — Other Ambulatory Visit (HOSPITAL_BASED_OUTPATIENT_CLINIC_OR_DEPARTMENT_OTHER): Payer: 59 | Admitting: Lab

## 2013-07-01 VITALS — BP 155/85 | HR 93 | Temp 98.1°F | Resp 16 | Wt 192.0 lb

## 2013-07-01 DIAGNOSIS — D693 Immune thrombocytopenic purpura: Secondary | ICD-10-CM

## 2013-07-01 LAB — CBC WITH DIFFERENTIAL (CANCER CENTER ONLY)
BASO%: 0.5 % (ref 0.0–2.0)
Eosinophils Absolute: 0.7 10*3/uL — ABNORMAL HIGH (ref 0.0–0.5)
MCH: 32.9 pg (ref 26.0–34.0)
MONO%: 7.2 % (ref 0.0–13.0)
NEUT#: 9 10*3/uL — ABNORMAL HIGH (ref 1.5–6.5)
Platelets: 15 10*3/uL — ABNORMAL LOW (ref 145–400)
RBC: 4.13 10*6/uL (ref 3.70–5.32)
RDW: 13.2 % (ref 11.1–15.7)
WBC: 13.4 10*3/uL — ABNORMAL HIGH (ref 3.9–10.0)

## 2013-07-01 LAB — CHCC SATELLITE - SMEAR

## 2013-07-01 MED ORDER — DEXAMETHASONE 4 MG PO TABS: ORAL_TABLET | ORAL | Status: DC

## 2013-07-01 NOTE — Progress Notes (Signed)
This office note has been dictated.

## 2013-07-02 NOTE — Progress Notes (Signed)
CC:   Valerie A. Felicity Coyer, MD  DIAGNOSIS:  Relapsed/refractory immune thrombocytopenia.  CURRENT THERAPY:  Observation.  INTERIM HISTORY:  Janice Martin comes in for followup.  We last saw her back in May.  She had a good summer.  She had no problems with fatigue or weakness.  She is still working in [employer].  This is busy for her. The patient has had no bleeding.  There has been no bruising.  She has had no change in bowel or bladder habits.  There is still a lot of stress in her family.  She is going to be moving.  I think some family members are moving in together to help save money. She has had no rashes.  She has had no leg swelling.  She has had no swallowing difficulties.  There has been no visual problems.  PHYSICAL EXAMINATION:  General:  This is a well-developed, well- nourished white female in no obvious distress.  Vital signs: Temperature of 98.1, pulse 93, respiratory rate 16, blood pressure 155/85.  Weight is 192.  Head and neck:  Shows a normocephalic atraumatic skull.  There are no ocular or oral lesions.  There are no palpable cervical or supraclavicular lymph nodes.  Lungs:  Clear bilaterally.  Cardiac:  Regular rate and rhythm with a normal S1 and S2. There are no murmurs, rubs or bruits.  Abdomen:  Soft.  She has a splenectomy scar.  There is no fluid wave.  There is no palpable hepatomegaly.  Extremities:  Show no rashes.  She has good range motion of her joints.  She has good strength in her legs and arms.  Skin exam: No ecchymosis or petechia.  Neurological exam:  No focal neurological deficits.  LABORATORY STUDIES:  White cell count is 13.4, hemoglobin 13.6, hematocrit 38.4, platelet count 15,000.  I looked at her blood smear.  Her platelets are markedly decreased.  She has several large platelets.  There are no schistocytes.  There are no nucleated red blood cells.  She has no immature myeloid cells.  There are no atypical  lymphocytes.  IMPRESSION:  Ms. Asbill is a 52 year old white female with relapsed immune thrombocytopenia.  Again, this is another relapse for her.  She was treated with Rituxan for her last relapse.  She completed this back in September of 2013.  We will go ahead and try another cycle of Rituxan.  She will get 4 weeks.  She wants to start in October.  Until then, we will put her on Decadron.  I gave her a tapered schedule. 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  We have not yet used an Nplate.  She is thinking that she will contemplate about this.  This is a weekly shot.  The nice thing about this is that this can be done in [place] where she works.  Again, we will get her on Decadron.  She knows to take it with food.  We will go ahead and start her on October 3rd.  I will plan to see her back probably about 2 weeks after the Rituxan starts.    ______________________________ Josph Macho, M.D. PRE/MEDQ  D:  06/30/2013  T:  07/02/2013  Job:  [id number]

## 2013-07-08 ENCOUNTER — Telehealth: Payer: Self-pay | Admitting: Hematology & Oncology

## 2013-07-08 NOTE — Telephone Encounter (Signed)
Sent a fax to  [facility] for case mgmt review of jcodes:  Doyce Loose, RN her number is [phone] ext. [phone] and her fax is [phone].

## 2013-07-22 ENCOUNTER — Ambulatory Visit (HOSPITAL_BASED_OUTPATIENT_CLINIC_OR_DEPARTMENT_OTHER): Payer: 59

## 2013-07-22 ENCOUNTER — Other Ambulatory Visit (HOSPITAL_BASED_OUTPATIENT_CLINIC_OR_DEPARTMENT_OTHER): Payer: 59 | Admitting: Lab

## 2013-07-22 ENCOUNTER — Ambulatory Visit (HOSPITAL_BASED_OUTPATIENT_CLINIC_OR_DEPARTMENT_OTHER): Payer: 59 | Admitting: Hematology & Oncology

## 2013-07-22 VITALS — BP 171/91 | HR 89 | Resp 18

## 2013-07-22 VITALS — BP 157/100 | HR 93 | Temp 98.1°F | Resp 16 | Ht 64.0 in | Wt 194.0 lb

## 2013-07-22 DIAGNOSIS — D693 Immune thrombocytopenic purpura: Secondary | ICD-10-CM

## 2013-07-22 DIAGNOSIS — Z5112 Encounter for antineoplastic immunotherapy: Secondary | ICD-10-CM

## 2013-07-22 LAB — CBC WITH DIFFERENTIAL (CANCER CENTER ONLY)
BASO%: 0.6 % (ref 0.0–2.0)
EOS%: 3.7 % (ref 0.0–7.0)
HCT: 37.3 % (ref 34.8–46.6)
LYMPH#: 2.1 10*3/uL (ref 0.9–3.3)
MCHC: 34 g/dL (ref 32.0–36.0)
MONO#: 0.8 10*3/uL (ref 0.1–0.9)
NEUT#: 6.2 10*3/uL (ref 1.5–6.5)
NEUT%: 65.4 % (ref 39.6–80.0)
Platelets: 140 10*3/uL — ABNORMAL LOW (ref 145–400)
RDW: 13.4 % (ref 11.1–15.7)
WBC: 9.5 10*3/uL (ref 3.9–10.0)

## 2013-07-22 MED ORDER — DIPHENHYDRAMINE HCL 25 MG PO CAPS
ORAL_CAPSULE | ORAL | Status: AC
  Filled 2013-07-22: qty 2

## 2013-07-22 MED ORDER — SODIUM CHLORIDE 0.9 % IV SOLN
Freq: Once | INTRAVENOUS | Status: AC
  Administered 2013-07-22: 10:00:00 via INTRAVENOUS

## 2013-07-22 MED ORDER — METHYLPREDNISOLONE SODIUM SUCC 125 MG IJ SOLR
125.0000 mg | Freq: Once | INTRAMUSCULAR | Status: AC | PRN
  Administered 2013-07-22: 125 mg via INTRAVENOUS

## 2013-07-22 MED ORDER — SODIUM CHLORIDE 0.9 % IV SOLN: 375.0000 mg/m2 | Freq: Once | INTRAVENOUS | Status: DC

## 2013-07-22 MED ORDER — SODIUM CHLORIDE 0.9 % IV SOLN
375.0000 mg/m2 | Freq: Once | INTRAVENOUS | Status: AC
  Administered 2013-07-22: 700 mg via INTRAVENOUS
  Filled 2013-07-22: qty 70

## 2013-07-22 MED ORDER — ACETAMINOPHEN 325 MG PO TABS
650.0000 mg | ORAL_TABLET | Freq: Once | ORAL | Status: AC
  Administered 2013-07-22: 650 mg via ORAL

## 2013-07-22 MED ORDER — ACETAMINOPHEN 325 MG PO TABS
ORAL_TABLET | ORAL | Status: AC
  Filled 2013-07-22: qty 2

## 2013-07-22 MED ORDER — DIPHENHYDRAMINE HCL 25 MG PO CAPS
50.0000 mg | ORAL_CAPSULE | Freq: Once | ORAL | Status: AC
  Administered 2013-07-22: 50 mg via ORAL

## 2013-07-22 NOTE — Progress Notes (Signed)
Pt tolerated remainder of infusion without complications. At a rate of 140ml/hr

## 2013-07-22 NOTE — Progress Notes (Signed)
1050: Pt started complaining of throat scratchiness, and some tightness. Rituxan interrupted. VS slightly elevated. Dr. Myna Hidalgo at chairside. Pt given 125mg  Solumedrol as ordered. States it is resolving after taking a few drinks. NS infusing. Will continue to monitor closely and attempt to rechallenge within the next hour per Dr. Myna Hidalgo. Husband at chairside. Pt states all symptoms have resolved at 1100.

## 2013-07-22 NOTE — Progress Notes (Signed)
This office note has been dictated.

## 2013-07-22 NOTE — Patient Instructions (Addendum)

## 2013-07-23 NOTE — Progress Notes (Signed)
CC:   Valerie A. Felicity Coyer, MD  DIAGNOSIS:  Refractory immune thrombocytopenia.  CURRENT THERAPY:  Patient to start Rituxan today.  INTERIM HISTORY:  Ms. Matton comes in for her followup.  She is doing okay.  Unfortunately, her immune thrombocytopenia did come back.  We got her on some Decadron until we could start the Rituxan.  She always responds to Decadron but does not like how it makes her feel.  She is still working over at [employer].  She is busy over there.  She has had no bleeding or bruising.  She has had no problems with cough or shortness of breath.  There has been no change in bowel or bladder habits.  She has had no skin rashes.  PHYSICAL EXAMINATION:  General:  This is a well-developed, well- nourished white female in no obvious distress.  Vital signs: Temperature of 98.1, pulse 93, respiratory rate 16, blood pressure 157/100.  Weight is 194.  Head and neck:  Normocephalic, atraumatic skull.  There are no ocular or oral lesions.  There are no palpable cervical or supraclavicular lymph nodes.  Lungs:  Clear bilaterally. Cardiac:  Regular rate and rhythm with a normal S1 and S2.  There are no murmurs, rubs or bruits.  Abdomen:  Soft.  She has good bowel sounds. There is no palpable abdominal mass.  There is no fluid wave.  There is no palpable hepatomegaly.  She has well-healed splenectomy scar. Extremities:  No clubbing, cyanosis or edema.  Neurologic:  No focal neurological deficits.  Skin:  No rashes, ecchymoses or petechiae.  LABORATORY STUDIES:  White cell count is 9.5, hemoglobin 12.7, hematocrit 37.3, platelet count is 140.  IMPRESSION:  Ms. Galeno is a very charming 52 year old white female with refractory immune thrombocytopenia.  We are trying her on Rituxan again. Her last dose of Rituxan was a year ago in September.  Again, she responds to the Decadron.  However, it just causes side effects for her that we cannot keep her on all time.  She has  already had a splenectomy.  We certainly could utilized Nplate.  I think she has been on Promacta before.  We will have her come back weekly for the Rituxan.  She will have 4 weeks of Rituxan.  I will plan to see her back probably in about 6 weeks myself. Hopefully, we will see that she is maintaining her platelet response.    ______________________________ Josph Macho, M.D. PRE/MEDQ  D:  07/22/2013  T:  07/23/2013  Job:  [id number]

## 2013-07-29 ENCOUNTER — Other Ambulatory Visit (HOSPITAL_BASED_OUTPATIENT_CLINIC_OR_DEPARTMENT_OTHER): Payer: 59 | Admitting: Lab

## 2013-07-29 ENCOUNTER — Ambulatory Visit (HOSPITAL_BASED_OUTPATIENT_CLINIC_OR_DEPARTMENT_OTHER): Payer: 59

## 2013-07-29 VITALS — BP 153/90 | HR 81 | Temp 97.1°F | Resp 16

## 2013-07-29 DIAGNOSIS — Z5112 Encounter for antineoplastic immunotherapy: Secondary | ICD-10-CM

## 2013-07-29 DIAGNOSIS — D693 Immune thrombocytopenic purpura: Secondary | ICD-10-CM

## 2013-07-29 LAB — CBC WITH DIFFERENTIAL (CANCER CENTER ONLY)
BASO#: 0.1 10*3/uL (ref 0.0–0.2)
EOS%: 7.2 % — ABNORMAL HIGH (ref 0.0–7.0)
Eosinophils Absolute: 0.5 10*3/uL (ref 0.0–0.5)
HGB: 13.1 g/dL (ref 11.6–15.9)
LYMPH#: 1.7 10*3/uL (ref 0.9–3.3)
MCH: 32.3 pg (ref 26.0–34.0)
MCHC: 34.4 g/dL (ref 32.0–36.0)
MONO%: 9 % (ref 0.0–13.0)
NEUT#: 4 10*3/uL (ref 1.5–6.5)
Platelets: 292 10*3/uL (ref 145–400)
RBC: 4.06 10*6/uL (ref 3.70–5.32)
WBC: 6.8 10*3/uL (ref 3.9–10.0)

## 2013-07-29 MED ORDER — ACETAMINOPHEN 325 MG PO TABS
ORAL_TABLET | ORAL | Status: AC
  Filled 2013-07-29: qty 2

## 2013-07-29 MED ORDER — METHYLPREDNISOLONE SODIUM SUCC 125 MG IJ SOLR
125.0000 mg | Freq: Once | INTRAMUSCULAR | Status: AC
  Administered 2013-07-29: 125 mg via INTRAVENOUS

## 2013-07-29 MED ORDER — SODIUM CHLORIDE 0.9 % IV SOLN
Freq: Once | INTRAVENOUS | Status: AC
  Administered 2013-07-29: 09:00:00 via INTRAVENOUS

## 2013-07-29 MED ORDER — DIPHENHYDRAMINE HCL 25 MG PO CAPS
ORAL_CAPSULE | ORAL | Status: AC
  Filled 2013-07-29: qty 2

## 2013-07-29 MED ORDER — DIPHENHYDRAMINE HCL 25 MG PO CAPS
50.0000 mg | ORAL_CAPSULE | Freq: Once | ORAL | Status: AC
  Administered 2013-07-29: 50 mg via ORAL

## 2013-07-29 MED ORDER — SODIUM CHLORIDE 0.9 % IV SOLN
375.0000 mg/m2 | Freq: Once | INTRAVENOUS | Status: AC
  Administered 2013-07-29: 700 mg via INTRAVENOUS
  Filled 2013-07-29: qty 70

## 2013-07-29 MED ORDER — METHYLPREDNISOLONE SODIUM SUCC 125 MG IJ SOLR
INTRAMUSCULAR | Status: AC
  Filled 2013-07-29: qty 2

## 2013-07-29 MED ORDER — ACETAMINOPHEN 325 MG PO TABS
650.0000 mg | ORAL_TABLET | Freq: Once | ORAL | Status: AC
  Administered 2013-07-29: 650 mg via ORAL

## 2013-07-29 NOTE — Patient Instructions (Signed)
[facility] Discharge Instructions for Patients Receiving Chemotherapy  Today you received the following chemotherapy agents Rituxan.  To help prevent nausea and vomiting after your treatment, we encourage you to take your nausea medication as prescribed.   If you develop nausea and vomiting that is not controlled by your nausea medication, call the clinic.   BELOW ARE SYMPTOMS THAT SHOULD BE REPORTED IMMEDIATELY:  *FEVER GREATER THAN 100.5 F  *CHILLS WITH OR WITHOUT FEVER  NAUSEA AND VOMITING THAT IS NOT CONTROLLED WITH YOUR NAUSEA MEDICATION  *UNUSUAL SHORTNESS OF BREATH  *UNUSUAL BRUISING OR BLEEDING  TENDERNESS IN MOUTH AND THROAT WITH OR WITHOUT PRESENCE OF ULCERS  *URINARY PROBLEMS  *BOWEL PROBLEMS  UNUSUAL RASH Items with * indicate a potential emergency and should be followed up as soon as possible.  Feel free to call the clinic you have any questions or concerns. The clinic phone number is [phone].    

## 2013-08-05 ENCOUNTER — Ambulatory Visit (HOSPITAL_BASED_OUTPATIENT_CLINIC_OR_DEPARTMENT_OTHER): Payer: 59

## 2013-08-05 ENCOUNTER — Other Ambulatory Visit (HOSPITAL_BASED_OUTPATIENT_CLINIC_OR_DEPARTMENT_OTHER): Payer: 59 | Admitting: Lab

## 2013-08-05 VITALS — BP 153/90 | HR 100 | Temp 98.4°F | Resp 20

## 2013-08-05 DIAGNOSIS — D693 Immune thrombocytopenic purpura: Secondary | ICD-10-CM

## 2013-08-05 DIAGNOSIS — Z5112 Encounter for antineoplastic immunotherapy: Secondary | ICD-10-CM

## 2013-08-05 LAB — TECHNOLOGIST REVIEW CHCC SATELLITE

## 2013-08-05 LAB — CBC WITH DIFFERENTIAL (CANCER CENTER ONLY)
BASO%: 0.9 % (ref 0.0–2.0)
EOS%: 3.5 % (ref 0.0–7.0)
LYMPH#: 2.4 10*3/uL (ref 0.9–3.3)
LYMPH%: 21.7 % (ref 14.0–48.0)
MCHC: 34.3 g/dL (ref 32.0–36.0)
MCV: 95 fL (ref 81–101)
MONO#: 1.1 10*3/uL — ABNORMAL HIGH (ref 0.1–0.9)
NEUT%: 64.1 % (ref 39.6–80.0)
Platelets: 406 10*3/uL — ABNORMAL HIGH (ref 145–400)
RDW: 14.1 % (ref 11.1–15.7)
WBC: 10.8 10*3/uL — ABNORMAL HIGH (ref 3.9–10.0)

## 2013-08-05 MED ORDER — METHYLPREDNISOLONE SODIUM SUCC 125 MG IJ SOLR
125.0000 mg | Freq: Once | INTRAMUSCULAR | Status: AC
  Administered 2013-08-05: 125 mg via INTRAVENOUS

## 2013-08-05 MED ORDER — ACETAMINOPHEN 325 MG PO TABS
650.0000 mg | ORAL_TABLET | Freq: Once | ORAL | Status: AC
  Administered 2013-08-05: 650 mg via ORAL

## 2013-08-05 MED ORDER — DIPHENHYDRAMINE HCL 25 MG PO CAPS
50.0000 mg | ORAL_CAPSULE | Freq: Once | ORAL | Status: AC
  Administered 2013-08-05: 50 mg via ORAL

## 2013-08-05 MED ORDER — SODIUM CHLORIDE 0.9 % IV SOLN
Freq: Once | INTRAVENOUS | Status: AC
  Administered 2013-08-05: 09:00:00 via INTRAVENOUS

## 2013-08-05 MED ORDER — METHYLPREDNISOLONE SODIUM SUCC 125 MG IJ SOLR
INTRAMUSCULAR | Status: AC
  Filled 2013-08-05: qty 2

## 2013-08-05 MED ORDER — DIPHENHYDRAMINE HCL 25 MG PO CAPS
ORAL_CAPSULE | ORAL | Status: AC
  Filled 2013-08-05: qty 2

## 2013-08-05 MED ORDER — ACETAMINOPHEN 325 MG PO TABS
ORAL_TABLET | ORAL | Status: AC
  Filled 2013-08-05: qty 2

## 2013-08-05 MED ORDER — SODIUM CHLORIDE 0.9 % IV SOLN
375.0000 mg/m2 | Freq: Once | INTRAVENOUS | Status: AC
  Administered 2013-08-05: 700 mg via INTRAVENOUS
  Filled 2013-08-05: qty 70

## 2013-08-05 NOTE — Patient Instructions (Signed)

## 2013-08-19 ENCOUNTER — Ambulatory Visit (HOSPITAL_BASED_OUTPATIENT_CLINIC_OR_DEPARTMENT_OTHER): Payer: 59

## 2013-08-19 ENCOUNTER — Other Ambulatory Visit (HOSPITAL_BASED_OUTPATIENT_CLINIC_OR_DEPARTMENT_OTHER): Payer: 59 | Admitting: Lab

## 2013-08-19 VITALS — BP 157/93 | HR 93 | Temp 97.2°F | Resp 20

## 2013-08-19 DIAGNOSIS — D693 Immune thrombocytopenic purpura: Secondary | ICD-10-CM

## 2013-08-19 DIAGNOSIS — Z5112 Encounter for antineoplastic immunotherapy: Secondary | ICD-10-CM

## 2013-08-19 LAB — CBC WITH DIFFERENTIAL (CANCER CENTER ONLY)
BASO#: 0.1 10*3/uL (ref 0.0–0.2)
BASO%: 0.4 % (ref 0.0–2.0)
LYMPH%: 17.8 % (ref 14.0–48.0)
MCH: 32.4 pg (ref 26.0–34.0)
MCV: 95 fL (ref 81–101)
MONO#: 1.2 10*3/uL — ABNORMAL HIGH (ref 0.1–0.9)
MONO%: 7.7 % (ref 0.0–13.0)
NEUT#: 11.1 10*3/uL — ABNORMAL HIGH (ref 1.5–6.5)
Platelets: 197 10*3/uL (ref 145–400)
RDW: 13.8 % (ref 11.1–15.7)
WBC: 15.8 10*3/uL — ABNORMAL HIGH (ref 3.9–10.0)

## 2013-08-19 MED ORDER — SODIUM CHLORIDE 0.9 % IV SOLN
375.0000 mg/m2 | Freq: Once | INTRAVENOUS | Status: AC
  Administered 2013-08-19: 700 mg via INTRAVENOUS
  Filled 2013-08-19: qty 70

## 2013-08-19 MED ORDER — DIPHENHYDRAMINE HCL 25 MG PO CAPS
ORAL_CAPSULE | ORAL | Status: AC
  Filled 2013-08-19: qty 2

## 2013-08-19 MED ORDER — METHYLPREDNISOLONE SODIUM SUCC 125 MG IJ SOLR
125.0000 mg | Freq: Once | INTRAMUSCULAR | Status: AC
  Administered 2013-08-19: 125 mg via INTRAVENOUS

## 2013-08-19 MED ORDER — METHYLPREDNISOLONE SODIUM SUCC 125 MG IJ SOLR
INTRAMUSCULAR | Status: AC
  Filled 2013-08-19: qty 2

## 2013-08-19 MED ORDER — ACETAMINOPHEN 325 MG PO TABS
ORAL_TABLET | ORAL | Status: AC
  Filled 2013-08-19: qty 2

## 2013-08-19 MED ORDER — DIPHENHYDRAMINE HCL 25 MG PO CAPS
50.0000 mg | ORAL_CAPSULE | Freq: Once | ORAL | Status: AC
  Administered 2013-08-19: 50 mg via ORAL

## 2013-08-19 MED ORDER — SODIUM CHLORIDE 0.9 % IV SOLN
Freq: Once | INTRAVENOUS | Status: AC
  Administered 2013-08-19: 09:00:00 via INTRAVENOUS

## 2013-08-19 MED ORDER — ACETAMINOPHEN 325 MG PO TABS
650.0000 mg | ORAL_TABLET | Freq: Once | ORAL | Status: AC
  Administered 2013-08-19: 650 mg via ORAL

## 2013-08-19 NOTE — Patient Instructions (Signed)

## 2013-08-25 ENCOUNTER — Other Ambulatory Visit: Payer: Self-pay

## 2013-09-01 ENCOUNTER — Ambulatory Visit (HOSPITAL_BASED_OUTPATIENT_CLINIC_OR_DEPARTMENT_OTHER): Payer: 59 | Admitting: Hematology & Oncology

## 2013-09-01 ENCOUNTER — Other Ambulatory Visit (HOSPITAL_BASED_OUTPATIENT_CLINIC_OR_DEPARTMENT_OTHER): Payer: 59 | Admitting: Lab

## 2013-09-01 VITALS — BP 144/80 | HR 84 | Temp 98.0°F | Resp 14 | Ht 64.0 in | Wt 193.0 lb

## 2013-09-01 DIAGNOSIS — D693 Immune thrombocytopenic purpura: Secondary | ICD-10-CM

## 2013-09-01 LAB — CBC WITH DIFFERENTIAL (CANCER CENTER ONLY)
EOS%: 5.7 % (ref 0.0–7.0)
Eosinophils Absolute: 0.7 10*3/uL — ABNORMAL HIGH (ref 0.0–0.5)
HCT: 37.9 % (ref 34.8–46.6)
LYMPH%: 16.6 % (ref 14.0–48.0)
MCH: 32.4 pg (ref 26.0–34.0)
MCHC: 34.8 g/dL (ref 32.0–36.0)
MCV: 93 fL (ref 81–101)
MONO%: 7.9 % (ref 0.0–13.0)
NEUT%: 69.1 % (ref 39.6–80.0)
Platelets: 404 10*3/uL — ABNORMAL HIGH (ref 145–400)
RBC: 4.07 10*6/uL (ref 3.70–5.32)

## 2013-09-01 NOTE — Progress Notes (Signed)
This office note has been dictated.

## 2013-09-10 NOTE — Progress Notes (Signed)
CC:   Janice A. Felicity Coyer, MD  DIAGNOSIS:  Relapsed chronic immune thrombocytopenia.  CURRENT THERAPY:  The patient is status post 4 weeks of Rituxan.  INTERVAL HISTORY:  Janice Martin comes in for a followup.  She did very well with the Rituxan.  She completed her 4 weeks of Rituxan back on October 31st.  She had no problems with the Rituxan.  Her thrombocytopenia has responded quite nicely to date.  She has not had any problems with cough or shortness of breath.  There have been no rashes.  She has had no nausea or vomiting.  There has been no bleeding or bruising.  She has had no leg swelling.  There has been no headache.  PHYSICAL EXAMINATION:  General:  This is a well-developed, well- nourished, white female in no obvious distress.  Vital Signs:  Show a temperature of 98, pulse 84, respiratory rate 14, blood pressure 144/80. Weight is 193 pounds.  Head and neck:  Shows a normocephalic, atraumatic skull.  There are no ocular or oral lesions.  There are no palpable cervical or supraclavicular lymph nodes.  Lungs:  Clear bilaterally. Cardiac:  Regular rate and rhythm with normal S1, S2.  There are no murmurs, rubs, or bruits.  Abdomen:  Soft.  She has well-healed splenectomy scar.  There is no fluid wave.  There is no palpable abdominal mass.  There is no palpable hepatomegaly.  Extremities:  Show no clubbing, cyanosis, or edema.  She has good range motion of her joints.  She has good muscle strength.  Skin:  No rashes, ecchymosis, or petechia.  Neurological:  No focal neurological deficit.  LABORATORY STUDIES:  White cell count 11.4, hemoglobin 13.2, hematocrit 37.9, platelet count 404.  On her peripheral smear, she has excellent platelet numbers.  She has several large platelets.  She has no immature myeloid cells.  There are no nucleated red blood cells.  I see no rouleaux formation.  She has no spherocytes.  I see no atypical lymphocytes.  IMPRESSION:  Janice Martin is a very  nice 52 year old white female with relapsed thrombocytopenia.  She has already had a splenectomy.  She has had Rituxan in the past.  Hopefully, with this 2nd round of Rituxan, she will have a prolonged remission.  We will go ahead and follow her along every 3 weeks with lab work.  I will plan to see her back probably in about 6 weeks.   ______________________________ Josph Macho, M.D. PRE/MEDQ  D:  09/01/2013  T:  09/10/2013  Job:  [id number]

## 2013-09-22 ENCOUNTER — Other Ambulatory Visit (HOSPITAL_BASED_OUTPATIENT_CLINIC_OR_DEPARTMENT_OTHER): Payer: 59 | Admitting: Lab

## 2013-09-22 DIAGNOSIS — D693 Immune thrombocytopenic purpura: Secondary | ICD-10-CM

## 2013-09-22 LAB — CBC WITH DIFFERENTIAL/PLATELET
BASO%: 1.2 % (ref 0.0–2.0)
EOS%: 5 % (ref 0.0–7.0)
HCT: 39.1 % (ref 34.8–46.6)
LYMPH%: 26.7 % (ref 14.0–49.7)
MCHC: 34.8 g/dL (ref 31.5–36.0)
MCV: 91.8 fL (ref 79.5–101.0)
MONO#: 1 10*3/uL — ABNORMAL HIGH (ref 0.1–0.9)
MONO%: 9 % (ref 0.0–14.0)
NEUT#: 6.5 10*3/uL (ref 1.5–6.5)
NEUT%: 58.1 % (ref 38.4–76.8)
Platelets: 341 10*3/uL (ref 145–400)
RBC: 4.26 10*6/uL (ref 3.70–5.45)
RDW: 13.7 % (ref 11.2–14.5)
WBC: 11.2 10*3/uL — ABNORMAL HIGH (ref 3.9–10.3)

## 2013-10-12 ENCOUNTER — Other Ambulatory Visit (HOSPITAL_BASED_OUTPATIENT_CLINIC_OR_DEPARTMENT_OTHER): Payer: 59

## 2013-10-12 ENCOUNTER — Other Ambulatory Visit: Payer: 59 | Admitting: Lab

## 2013-10-12 ENCOUNTER — Ambulatory Visit (HOSPITAL_BASED_OUTPATIENT_CLINIC_OR_DEPARTMENT_OTHER): Payer: 59 | Admitting: Hematology & Oncology

## 2013-10-12 ENCOUNTER — Telehealth: Payer: Self-pay | Admitting: Hematology & Oncology

## 2013-10-12 VITALS — BP 137/78 | HR 87 | Temp 97.0°F | Resp 18

## 2013-10-12 DIAGNOSIS — D693 Immune thrombocytopenic purpura: Secondary | ICD-10-CM

## 2013-10-12 LAB — CBC WITH DIFFERENTIAL (CANCER CENTER ONLY)
BASO#: 0.1 10*3/uL (ref 0.0–0.2)
BASO%: 1.3 % (ref 0.0–2.0)
EOS%: 9.7 % — ABNORMAL HIGH (ref 0.0–7.0)
Eosinophils Absolute: 1 10*3/uL — ABNORMAL HIGH (ref 0.0–0.5)
HGB: 13.2 g/dL (ref 11.6–15.9)
LYMPH#: 1.9 10*3/uL (ref 0.9–3.3)
MCH: 32.1 pg (ref 26.0–34.0)
MCHC: 34.2 g/dL (ref 32.0–36.0)
MCV: 94 fL (ref 81–101)
MONO#: 1.1 10*3/uL — ABNORMAL HIGH (ref 0.1–0.9)
MONO%: 11 % (ref 0.0–13.0)
NEUT#: 5.8 10*3/uL (ref 1.5–6.5)
RBC: 4.11 10*6/uL (ref 3.70–5.32)
WBC: 9.8 10*3/uL (ref 3.9–10.0)

## 2013-10-12 LAB — CHCC SATELLITE - SMEAR

## 2013-10-12 NOTE — Progress Notes (Signed)
This office note has been dictated.

## 2013-10-12 NOTE — Telephone Encounter (Signed)
Left pt message to call for appointment after 1-15 appointment

## 2013-10-13 NOTE — Progress Notes (Signed)
CC:   Valerie A. Felicity Coyer, MD  DIAGNOSIS:  Relapsed chronic immune thrombocytopenia.  CURRENT THERAPY:  Observation.  INTERIM HISTORY:  Ms. Goguen comes in for followup.  She actually completed 4 weeks of Rituxan in October.  She did well with Rituxan. She responded very nicely to Rituxan.  She has had no problems with bleeding or bruising.  She is working.  She has had no problems with fatigue or weakness.  She had a granddaughter born on November 23rd.  She is very happy about this.  She has had no fever, sweats, or chills.  She is losing a little weight.  She is trying to lose some weight.  She has not noticed any kind of rashes.  There has been no change in bowel or bladder habits.  PHYSICAL EXAMINATION:  General:  This is a well-developed, well- nourished white female in no obvious distress.  Vital Signs: Temperature of 97, pulse 87, respiratory rate 18, blood pressure 137/78, weight is 188 pounds.  Head and Neck:  Normocephalic, atraumatic skull. There are no ocular or oral lesions.  There are no palpable cervical or supraclavicular lymph nodes.  Lungs:  Clear bilaterally.  Cardiac: Regular rate and rhythm with a normal S1 and S2.  There are no murmurs, rubs, or bruits.  Abdomen:  Soft.  She has good bowel sounds.  There is no palpable abdominal mass.  She has a well-healed laparotomy scar from her splenectomy.  There is no fluid wave.  No palpable hepatomegaly is noted.  Back:  No tenderness over the spine, ribs, or hips. Extremities:  No clubbing, cyanosis, or edema.  Skin:  No rashes, ecchymoses, or petechia.  Neurologic:  No focal neurological deficits.  LABORATORY STUDIES:  White cell count is 9.8, hemoglobin 13.2, hematocrit 38.2, platelet count 430,000.  IMPRESSION:  Janice Martin is a very charming 52 year old white female with chronic relapsed immune thrombocytopenia.  She has responded to Rituxan in the past.  Hopefully, we will continue to see that she is  doing well.  Hopefully, we will get her through __________ without having to do anything with her.  I plan to see her back in 6 weeks now.  I do not think we need any lab work in between visits unless she feels that there is a problem.    ______________________________ Josph Macho, M.D. PRE/MEDQ  D:  10/12/2013  T:  10/12/2013  Job:  [id number]

## 2013-11-02 ENCOUNTER — Other Ambulatory Visit: Payer: 59

## 2013-11-03 ENCOUNTER — Other Ambulatory Visit (HOSPITAL_BASED_OUTPATIENT_CLINIC_OR_DEPARTMENT_OTHER): Payer: 59

## 2013-11-03 DIAGNOSIS — D693 Immune thrombocytopenic purpura: Secondary | ICD-10-CM

## 2013-11-03 LAB — CBC WITH DIFFERENTIAL/PLATELET
HCT: 42.5 % (ref 34.8–46.6)
HGB: 14.7 g/dL (ref 11.6–15.9)
MCH: 32 pg (ref 25.1–34.0)
MCHC: 34.6 g/dL (ref 31.5–36.0)
MCV: 92.4 fL (ref 79.5–101.0)
Platelets: 204 10*3/uL (ref 145–400)
RBC: 4.6 10*6/uL (ref 3.70–5.45)
RDW: 13.4 % (ref 11.2–14.5)
WBC: 8.8 10*3/uL (ref 3.9–10.3)

## 2013-11-03 LAB — MANUAL DIFFERENTIAL
ALC: 2.4 10*3/uL (ref 0.9–3.3)
ANC (CHCC MAN DIFF): 5 10*3/uL (ref 1.5–6.5)
BASOPHIL: 0 % (ref 0–2)
BLASTS: 0 % (ref 0–0)
Band Neutrophils: 0 % (ref 0–10)
EOS: 10 % — ABNORMAL HIGH (ref 0–7)
LYMPH: 27 % (ref 14–49)
METAMYELOCYTES PCT: 0 % (ref 0–0)
MONO: 6 % (ref 0–14)
MYELOCYTES: 0 % (ref 0–0)
Other Cell: 0 % (ref 0–0)
PLT EST: ADEQUATE
PROMYELO: 0 % (ref 0–0)
SEG: 57 % (ref 38–77)
Variant Lymph: 0 % (ref 0–0)
nRBC: 0 % (ref 0–0)

## 2013-11-24 ENCOUNTER — Other Ambulatory Visit (HOSPITAL_BASED_OUTPATIENT_CLINIC_OR_DEPARTMENT_OTHER): Payer: 59 | Admitting: Lab

## 2013-11-24 ENCOUNTER — Encounter: Payer: Self-pay | Admitting: Hematology & Oncology

## 2013-11-24 ENCOUNTER — Ambulatory Visit (HOSPITAL_BASED_OUTPATIENT_CLINIC_OR_DEPARTMENT_OTHER): Payer: 59 | Admitting: Hematology & Oncology

## 2013-11-24 VITALS — BP 150/69 | HR 85 | Temp 97.8°F | Resp 14 | Ht 64.0 in | Wt 187.0 lb

## 2013-11-24 DIAGNOSIS — D693 Immune thrombocytopenic purpura: Secondary | ICD-10-CM

## 2013-11-24 LAB — CBC WITH DIFFERENTIAL (CANCER CENTER ONLY)
BASO#: 0.1 10*3/uL (ref 0.0–0.2)
BASO%: 0.6 % (ref 0.0–2.0)
EOS%: 7 % (ref 0.0–7.0)
Eosinophils Absolute: 0.7 10*3/uL — ABNORMAL HIGH (ref 0.0–0.5)
HCT: 38.7 % (ref 34.8–46.6)
HGB: 13.2 g/dL (ref 11.6–15.9)
LYMPH#: 1.9 10*3/uL (ref 0.9–3.3)
LYMPH%: 19.7 % (ref 14.0–48.0)
MCH: 31.4 pg (ref 26.0–34.0)
MCHC: 34.1 g/dL (ref 32.0–36.0)
MCV: 92 fL (ref 81–101)
MONO#: 0.9 10*3/uL (ref 0.1–0.9)
MONO%: 9.6 % (ref 0.0–13.0)
NEUT#: 5.9 10*3/uL (ref 1.5–6.5)
NEUT%: 63.1 % (ref 39.6–80.0)
PLATELETS: 433 10*3/uL — AB (ref 145–400)
RBC: 4.21 10*6/uL (ref 3.70–5.32)
RDW: 12.8 % (ref 11.1–15.7)
WBC: 9.4 10*3/uL (ref 3.9–10.0)

## 2013-11-24 LAB — CHCC SATELLITE - SMEAR

## 2013-11-24 NOTE — Progress Notes (Signed)
This office note has been dictated.

## 2013-11-26 NOTE — Progress Notes (Signed)
CC:   Valerie A. Asa Lente, MD  DIAGNOSIS:  Relapsed chronic immune thrombocytopenia.  CURRENT THERAPY:  Observation.  INTERIM HISTORY:  Ms. Janice Martin comes in for followup.  We last saw her back in December.  She is doing very nicely.  She is working.  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  She has had no problems with bleeding or bruising.  She has had no issues with the flu.  There has been no fever.  She has had no cough or shortness of breath.  She has had no change in bowel or bladder habits.  PHYSICAL EXAMINATION:  General:  This is a well-developed, well- nourished white female, in no obvious distress.  Vital Signs:  Show temperature of 97.8, pulse 85, respiratory rate 14, blood pressure 150/69.  Weight is 187 pounds.  Head and Neck:  Normocephalic, atraumatic skull.  There are no ocular or oral lesions.  There are no palpable cervical or supraclavicular lymph nodes.  Lungs:  Clear bilaterally.  Cardiac Exam:  Regular rate and rhythm with a normal S1, S2.  There are no murmurs, rubs, or bruits.  Abdomen:  Soft.  She has a well-healed splenectomy scar.  There is no fluid wave.  There is no palpable hepatomegaly.  Back Exam:  No tenderness over the spine, ribs, or hips.  Extremities:  Shows no clubbing, cyanosis, or edema.  Skin Exam:  No rashes, ecchymosis, or petechia.  LABORATORY STUDIES:  White cell count is 9.4, hemoglobin 13.2, hematocrit 38.7, platelet count 433.  IMPRESSION:  Ms. Janice Martin is a very charming 53 year old white female with relapsed chronic immune thrombocytopenia.  We did treat her with Rituxan.  She completed Rituxan in October.  Her platelet count is holding steady right now.  I think, we can probably get her back in another 6-8 weeks.  I do not see that we have to do any blood work in between visits unless she does have a problem.  Ms. Felmlee will always known now when her platelet count goes down.    ______________________________ [name], M.D. PRE/MEDQ  D:  11/24/2013  T:  11/25/2013  Job:  [id number]

## 2014-01-18 ENCOUNTER — Ambulatory Visit (HOSPITAL_BASED_OUTPATIENT_CLINIC_OR_DEPARTMENT_OTHER): Payer: 59 | Admitting: Hematology & Oncology

## 2014-01-18 ENCOUNTER — Other Ambulatory Visit (HOSPITAL_BASED_OUTPATIENT_CLINIC_OR_DEPARTMENT_OTHER): Payer: 59 | Admitting: Lab

## 2014-01-18 VITALS — BP 118/74 | HR 80 | Temp 97.5°F | Resp 16 | Wt 187.0 lb

## 2014-01-18 DIAGNOSIS — D693 Immune thrombocytopenic purpura: Secondary | ICD-10-CM

## 2014-01-18 LAB — CBC WITH DIFFERENTIAL (CANCER CENTER ONLY)
BASO#: 0.1 10*3/uL (ref 0.0–0.2)
BASO%: 1.1 % (ref 0.0–2.0)
EOS ABS: 0.8 10*3/uL — AB (ref 0.0–0.5)
EOS%: 7.7 % — ABNORMAL HIGH (ref 0.0–7.0)
HEMATOCRIT: 40.1 % (ref 34.8–46.6)
HGB: 13.9 g/dL (ref 11.6–15.9)
LYMPH#: 2.1 10*3/uL (ref 0.9–3.3)
LYMPH%: 19.5 % (ref 14.0–48.0)
MCH: 32 pg (ref 26.0–34.0)
MCHC: 34.7 g/dL (ref 32.0–36.0)
MCV: 92 fL (ref 81–101)
MONO#: 0.9 10*3/uL (ref 0.1–0.9)
MONO%: 8.1 % (ref 0.0–13.0)
NEUT#: 6.7 10*3/uL — ABNORMAL HIGH (ref 1.5–6.5)
NEUT%: 63.6 % (ref 39.6–80.0)
PLATELETS: 396 10*3/uL (ref 145–400)
RBC: 4.35 10*6/uL (ref 3.70–5.32)
RDW: 13.5 % (ref 11.1–15.7)
WBC: 10.6 10*3/uL — AB (ref 3.9–10.0)

## 2014-01-18 NOTE — Progress Notes (Signed)
Hematology and Oncology Follow Up Visit  Janice Martin [id number] 07-26-1961 53 y.o. 01/18/2014   Principle Diagnosis:   Chronic immune thrombocytopenia-recurrent  Current Therapy:    Observation     Interim History:  [name] is back for followup. She's widowed well. She's working. 7 no problems with bleeding or bruising. She's having no cough or shortness of breath. She's had no abdominal pain. There's been no issues with fever. There's been no rashes. She's had no leg swelling.  Medications: Current outpatient prescriptions:nebivolol (BYSTOLIC) 5 MG tablet, Take 1 tablet (5 mg total) by mouth daily., Disp: 30 tablet, Rfl: 11;  valsartan-hydrochlorothiazide (DIOVAN-HCT) 320-25 MG per tablet, Take 1 tablet by mouth daily., Disp: 30 tablet, Rfl: 11  Allergies:  Allergies  Allergen Reactions  . Shellfish Allergy Anaphylaxis  . Influenza Vaccines     Per pt can't take mess up with her immune system  . Morphine     Past Medical History, Surgical history, Social history, and Family History were reviewed and updated.  Review of Systems: As above  Physical Exam:  weight is 187 lb (84.823 kg). Her temperature is 97.5 F (36.4 C). Her blood pressure is 118/74 and her pulse is 80. Her respiration is 16.   Well-developed and well-nourished white female. Head and neck exam shows no ocular or oral lesions. There is no adenopathy or neck. Lungs are clear. Cardiac exam regular in rhythm with no murmurs rubs or bruits. Abdomen has a well-healed slightly scar. She has no fluid wave. There is no palpable liver edge. Back exam no tenderness over the spine ribs or hips. Extremities no clubbing cyanosis or edema. Skin exam no rashes ecchymoses or petechia.  Lab Results  Component Value Date   WBC 10.6* 01/18/2014   HGB 13.9 01/18/2014   HCT 40.1 01/18/2014   MCV 92 01/18/2014   PLT 396 01/18/2014     Chemistry      Component Value Date/Time   NA 138 02/18/2013 0855   NA 136 03/03/2011 0804   K 3.9  02/18/2013 0855   K 4.4 03/03/2011 0804   CL 101 02/18/2013 0855   CL 97* 03/03/2011 0804   CO2 29 02/18/2013 0855   CO2 26 03/03/2011 0804   BUN 16 02/18/2013 0855   BUN 12 03/03/2011 0804   CREATININE 1.0 02/18/2013 0855   CREATININE 0.8 03/03/2011 0804      Component Value Date/Time   CALCIUM 9.2 02/18/2013 0855   CALCIUM 9.8 03/03/2011 0804   ALKPHOS 89 02/18/2013 0855   ALKPHOS 75 03/03/2011 0804   AST 21 02/18/2013 0855   AST 15 03/03/2011 0804   ALT 29 02/18/2013 0855   ALT 23 03/03/2011 0804   BILITOT 0.7 02/18/2013 0855   BILITOT 0.40 03/03/2011 0804         Impression and Plan: Janice Martin is a 53 year old white female. She has a history of relapsed chronic immune thrombocytopenia. We'll treat her with Rituxan. She responded very well. She got treated back in October of 2014.  So far, she is retaining very nice response.  Opening her back in 3 months now. She was knows to come in sooner if she thinks that her platelet count is going down.   [name], MD 4/1/20159:34 AM

## 2014-01-27 ENCOUNTER — Encounter: Payer: Self-pay | Admitting: *Deleted

## 2014-02-21 ENCOUNTER — Encounter: Payer: 59 | Admitting: Internal Medicine

## 2014-04-19 ENCOUNTER — Ambulatory Visit (HOSPITAL_BASED_OUTPATIENT_CLINIC_OR_DEPARTMENT_OTHER): Payer: 59 | Admitting: Hematology & Oncology

## 2014-04-19 ENCOUNTER — Other Ambulatory Visit (HOSPITAL_BASED_OUTPATIENT_CLINIC_OR_DEPARTMENT_OTHER): Payer: 59 | Admitting: Lab

## 2014-04-19 ENCOUNTER — Encounter: Payer: Self-pay | Admitting: Hematology & Oncology

## 2014-04-19 VITALS — BP 151/77 | HR 83 | Temp 97.9°F | Resp 14 | Ht 64.0 in | Wt 192.0 lb

## 2014-04-19 DIAGNOSIS — D693 Immune thrombocytopenic purpura: Secondary | ICD-10-CM

## 2014-04-19 LAB — CBC WITH DIFFERENTIAL (CANCER CENTER ONLY)
BASO#: 0.1 10*3/uL (ref 0.0–0.2)
BASO%: 0.7 % (ref 0.0–2.0)
EOS%: 8.2 % — AB (ref 0.0–7.0)
Eosinophils Absolute: 0.7 10*3/uL — ABNORMAL HIGH (ref 0.0–0.5)
HEMATOCRIT: 40.7 % (ref 34.8–46.6)
HGB: 14.2 g/dL (ref 11.6–15.9)
LYMPH#: 2.1 10*3/uL (ref 0.9–3.3)
LYMPH%: 24 % (ref 14.0–48.0)
MCH: 32.4 pg (ref 26.0–34.0)
MCHC: 34.9 g/dL (ref 32.0–36.0)
MCV: 93 fL (ref 81–101)
MONO#: 0.8 10*3/uL (ref 0.1–0.9)
MONO%: 9 % (ref 0.0–13.0)
NEUT#: 5 10*3/uL (ref 1.5–6.5)
NEUT%: 58.1 % (ref 39.6–80.0)
PLATELETS: 356 10*3/uL (ref 145–400)
RBC: 4.38 10*6/uL (ref 3.70–5.32)
RDW: 13.7 % (ref 11.1–15.7)
WBC: 8.6 10*3/uL (ref 3.9–10.0)

## 2014-04-19 LAB — CHCC SATELLITE - SMEAR

## 2014-04-20 NOTE — Progress Notes (Signed)
Hematology and Oncology Follow Up Visit  MALAYJA FREUND [id number] 1961/02/23 53 y.o. 04/20/2014   Principle Diagnosis:   Chronic immune thrombocytopenia-recurrent  Current Therapy:    Observation     Interim History:  Ms.  Donahoe is back for followup. She's widowed well. She's working. There haven't been no problems with bleeding or bruising. She's having no cough or shortness of breath. She's had no abdominal pain. There's been no issues with fever. There's been no rashes. She's had no leg swelling. She has had no headache. There has been no change in bowel or bladder habits.  Medications: Current outpatient prescriptions:nebivolol (BYSTOLIC) 5 MG tablet, Take 1 tablet (5 mg total) by mouth daily., Disp: 30 tablet, Rfl: 11;  valsartan-hydrochlorothiazide (DIOVAN-HCT) 320-25 MG per tablet, Take 1 tablet by mouth daily., Disp: 30 tablet, Rfl: 11  Allergies:  Allergies  Allergen Reactions  . Shellfish Allergy Anaphylaxis  . Influenza Vaccines     Per pt can't take mess up with her immune system  . Morphine     Past Medical History, Surgical history, Social history, and Family History were reviewed and updated.  Review of Systems: As above  Physical Exam:  height is 5\' 4"  (1.626 m) and weight is 192 lb (87.091 kg). Her oral temperature is 97.9 F (36.6 C). Her blood pressure is 151/77 and her pulse is 83. Her respiration is 14.   Well-developed and well-nourished white female. Head and neck exam shows no ocular or oral lesions. There is no adenopathy or neck. Lungs are clear. Cardiac exam regular in rhythm with no murmurs rubs or bruits. Abdomen has a well-healed slightly scar. She has no fluid wave. There is no palpable liver edge. Back exam no tenderness over the spine ribs or hips. Extremities no clubbing cyanosis or edema. Skin exam no rashes ecchymoses or petechia.  Lab Results  Component Value Date   WBC 8.6 04/19/2014   HGB 14.2 04/19/2014   HCT 40.7 04/19/2014   MCV 93  04/19/2014   PLT 356 04/19/2014     Chemistry      Component Value Date/Time   NA 138 02/18/2013 0855   NA 136 03/03/2011 0804   K 3.9 02/18/2013 0855   K 4.4 03/03/2011 0804   CL 101 02/18/2013 0855   CL 97* 03/03/2011 0804   CO2 29 02/18/2013 0855   CO2 26 03/03/2011 0804   BUN 16 02/18/2013 0855   BUN 12 03/03/2011 0804   CREATININE 1.0 02/18/2013 0855   CREATININE 0.8 03/03/2011 0804      Component Value Date/Time   CALCIUM 9.2 02/18/2013 0855   CALCIUM 9.8 03/03/2011 0804   ALKPHOS 89 02/18/2013 0855   ALKPHOS 75 03/03/2011 0804   AST 21 02/18/2013 0855   AST 15 03/03/2011 0804   ALT 29 02/18/2013 0855   ALT 23 03/03/2011 0804   BILITOT 0.7 02/18/2013 0855   BILITOT 0.40 03/03/2011 0804         Impression and Plan: Ms. Fisher is a 53 year old white female. She has a history of relapsed chronic immune thrombocytopenia. We'll treat her with Rituxan. She responded very well. She got treated back in October of 2014.  So far, she is retaining very nice response.  We will plan to see her back in 3 months now. She knows to come in sooner if she thinks that her platelet count is going down.   [name], MD 7/2/20156:22 AM

## 2014-05-03 ENCOUNTER — Telehealth: Payer: Self-pay | Admitting: Internal Medicine

## 2014-05-03 MED ORDER — VALSARTAN-HYDROCHLOROTHIAZIDE 320-25 MG PO TABS: 1.0000 | ORAL_TABLET | Freq: Every day | ORAL | Status: DC

## 2014-05-03 MED ORDER — NEBIVOLOL HCL 5 MG PO TABS: 5.0000 mg | ORAL_TABLET | Freq: Every day | ORAL | Status: DC

## 2014-05-03 NOTE — Telephone Encounter (Signed)
Pt was wondering if Dr. Asa Lente will send in some of Bystolic and Valsartan to [place]. Pt has an CPE schedule on 06/27/14, had to reschedule from 05/12/14. Please advise.

## 2014-05-03 NOTE — Telephone Encounter (Signed)
Ok to refill these prior to CPX

## 2014-05-03 NOTE — Telephone Encounter (Signed)
Notified pt  We sent enough med until cpx appt...Janice Martin

## 2014-05-12 ENCOUNTER — Encounter: Payer: 59 | Admitting: Internal Medicine

## 2014-06-27 ENCOUNTER — Encounter: Payer: Self-pay | Admitting: Internal Medicine

## 2014-06-27 ENCOUNTER — Ambulatory Visit (INDEPENDENT_AMBULATORY_CARE_PROVIDER_SITE_OTHER): Payer: 59 | Admitting: Internal Medicine

## 2014-06-27 VITALS — BP 142/90 | HR 75 | Temp 98.2°F | Ht 64.0 in | Wt 202.0 lb

## 2014-06-27 DIAGNOSIS — I10 Essential (primary) hypertension: Secondary | ICD-10-CM

## 2014-06-27 DIAGNOSIS — Z0000 Encounter for general adult medical examination without abnormal findings: Secondary | ICD-10-CM

## 2014-06-27 DIAGNOSIS — D693 Immune thrombocytopenic purpura: Secondary | ICD-10-CM

## 2014-06-27 DIAGNOSIS — Z1211 Encounter for screening for malignant neoplasm of colon: Secondary | ICD-10-CM

## 2014-06-27 MED ORDER — NEBIVOLOL HCL 5 MG PO TABS: 5.0000 mg | ORAL_TABLET | Freq: Every day | ORAL | Status: DC

## 2014-06-27 MED ORDER — VALSARTAN-HYDROCHLOROTHIAZIDE 320-25 MG PO TABS: 1.0000 | ORAL_TABLET | Freq: Every day | ORAL | Status: DC

## 2014-06-27 NOTE — Assessment & Plan Note (Signed)
BP Readings from Last 3 Encounters:  06/27/14 142/90  04/19/14 151/77  01/18/14 118/74   Pt reports great variability, including good control at home Reports compliance with bystolic and ARB/hct as rx'd the patient to continue to monitor and call if uncontrolled before next visit

## 2014-06-27 NOTE — Progress Notes (Signed)
Pre visit review using our clinic review tool, if applicable. No additional management support is needed unless otherwise documented below in the visit note. 

## 2014-06-27 NOTE — Patient Instructions (Addendum)
It was good to see you today.  We have reviewed your prior records including labs and tests today  Health Maintenance reviewed - refer for colon screening -all other recommended immunizations and age-appropriate screenings are up-to-date.  Test(s) ordered today. Return when you are fasting in AM. Your results will be released to MyChart (or called to you) after review, usually within 72hours after test completion. If any changes need to be made, you will be notified at that same time.  Medications reviewed and updated, no changes recommended at this time.  Please schedule followup in 12 months for annual exam and labs, call sooner if problems.  Health Maintenance Adopting a healthy lifestyle and getting preventive care can go a long way to promote health and wellness. Talk with your health care provider about what schedule of regular examinations is right for you. This is a good chance for you to check in with your provider about disease prevention and staying healthy. In between checkups, there are plenty of things you can do on your own. Experts have done a lot of research about which lifestyle changes and preventive measures are most likely to keep you healthy. Ask your health care provider for more information. WEIGHT AND DIET  Eat a healthy diet  Be sure to include plenty of vegetables, fruits, low-fat dairy products, and lean protein.  Do not eat a lot of foods high in solid fats, added sugars, or salt.  Get regular exercise. This is one of the most important things you can do for your health.  Most adults should exercise for at least 150 minutes each week. The exercise should increase your heart rate and make you sweat (moderate-intensity exercise).  Most adults should also do strengthening exercises at least twice a week. This is in addition to the moderate-intensity exercise.  Maintain a healthy weight  Body mass index (BMI) is a measurement that can be used to identify  possible weight problems. It estimates body fat based on height and weight. Your health care provider can help determine your BMI and help you achieve or maintain a healthy weight.  For females 26 years of age and older:   A BMI below 18.5 is considered underweight.  A BMI of 18.5 to 24.9 is normal.  A BMI of 25 to 29.9 is considered overweight.  A BMI of 30 and above is considered obese.  Watch levels of cholesterol and blood lipids  You should start having your blood tested for lipids and cholesterol at 51 years of age, then have this test every 5 years.  You may need to have your cholesterol levels checked more often if:  Your lipid or cholesterol levels are high.  You are older than 51 years of age.  You are at high risk for heart disease.  CANCER SCREENING   Lung Cancer  Lung cancer screening is recommended for adults 2-79 years old who are at high risk for lung cancer because of a history of smoking.  A yearly low-dose CT scan of the lungs is recommended for people who:  Currently smoke.  Have quit within the past 15 years.  Have at least a 30-pack-year history of smoking. A pack year is smoking an average of one pack of cigarettes a day for 1 year.  Yearly screening should continue until it has been 15 years since you quit.  Yearly screening should stop if you develop a health problem that would prevent you from having lung cancer treatment.  Breast Cancer  Practice breast self-awareness. This means understanding how your breasts normally appear and feel.  It also means doing regular breast self-exams. Let your health care provider know about any changes, no matter how small.  If you are in your 20s or 30s, you should have a clinical breast exam (CBE) by a health care provider every 1-3 years as part of a regular health exam.  If you are 51 or older, have a CBE every year. Also consider having a breast X-ray (mammogram) every year.  If you have a family  history of breast cancer, talk to your health care provider about genetic screening.  If you are at high risk for breast cancer, talk to your health care provider about having an MRI and a mammogram every year.  Breast cancer gene (BRCA) assessment is recommended for women who have family members with BRCA-related cancers. BRCA-related cancers include:  Breast.  Ovarian.  Tubal.  Peritoneal cancers.  Results of the assessment will determine the need for genetic counseling and BRCA1 and BRCA2 testing. Cervical Cancer Routine pelvic examinations to screen for cervical cancer are no longer recommended for nonpregnant women who are considered low risk for cancer of the pelvic organs (ovaries, uterus, and vagina) and who do not have symptoms. A pelvic examination may be necessary if you have symptoms including those associated with pelvic infections. Ask your health care provider if a screening pelvic exam is right for you.   The Pap test is the screening test for cervical cancer for women who are considered at risk.  If you had a hysterectomy for a problem that was not cancer or a condition that could lead to cancer, then you no longer need Pap tests.  If you are older than 65 years, and you have had normal Pap tests for the past 10 years, you no longer need to have Pap tests.  If you have had past treatment for cervical cancer or a condition that could lead to cancer, you need Pap tests and screening for cancer for at least 20 years after your treatment.  If you no longer get a Pap test, assess your risk factors if they change (such as having a new sexual partner). This can affect whether you should start being screened again.  Some women have medical problems that increase their chance of getting cervical cancer. If this is the case for you, your health care provider may recommend more frequent screening and Pap tests.  The human papillomavirus (HPV) test is another test that may be used  for cervical cancer screening. The HPV test looks for the virus that can cause cell changes in the cervix. The cells collected during the Pap test can be tested for HPV.  The HPV test can be used to screen women 62 years of age and older. Getting tested for HPV can extend the interval between normal Pap tests from three to five years.  An HPV test also should be used to screen women of any age who have unclear Pap test results.  After 51 years of age, women should have HPV testing as often as Pap tests.  Colorectal Cancer  This type of cancer can be detected and often prevented.  Routine colorectal cancer screening usually begins at 51 years of age and continues through 51 years of age.  Your health care provider may recommend screening at an earlier age if you have risk factors for colon cancer.  Your health care provider may also recommend using home test kits  to check for hidden blood in the stool.  A small camera at the end of a tube can be used to examine your colon directly (sigmoidoscopy or colonoscopy). This is done to check for the earliest forms of colorectal cancer.  Routine screening usually begins at age 6.  Direct examination of the colon should be repeated every 5-10 years through 51 years of age. However, you may need to be screened more often if early forms of precancerous polyps or small growths are found. Skin Cancer  Check your skin from head to toe regularly.  Tell your health care provider about any new moles or changes in moles, especially if there is a change in a mole's shape or color.  Also tell your health care provider if you have a mole that is larger than the size of a pencil eraser.  Always use sunscreen. Apply sunscreen liberally and repeatedly throughout the day.  Protect yourself by wearing long sleeves, pants, a wide-brimmed hat, and sunglasses whenever you are outside. HEART DISEASE, DIABETES, AND HIGH BLOOD PRESSURE   Have your blood pressure  checked at least every 1-2 years. High blood pressure causes heart disease and increases the risk of stroke.  If you are between 72 years and 61 years old, ask your health care provider if you should take aspirin to prevent strokes.  Have regular diabetes screenings. This involves taking a blood sample to check your fasting blood sugar level.  If you are at a normal weight and have a low risk for diabetes, have this test once every three years after 51 years of age.  If you are overweight and have a high risk for diabetes, consider being tested at a younger age or more often. PREVENTING INFECTION  Hepatitis B  If you have a higher risk for hepatitis B, you should be screened for this virus. You are considered at high risk for hepatitis B if:  You were born in a country where hepatitis B is common. Ask your health care provider which countries are considered high risk.  Your parents were born in a high-risk country, and you have not been immunized against hepatitis B (hepatitis B vaccine).  You have HIV or AIDS.  You use needles to inject street drugs.  You live with someone who has hepatitis B.  You have had sex with someone who has hepatitis B.  You get hemodialysis treatment.  You take certain medicines for conditions, including cancer, organ transplantation, and autoimmune conditions. Hepatitis C  Blood testing is recommended for:  Everyone born from [birth year] through 1963.  Anyone with known risk factors for hepatitis C. Sexually transmitted infections (STIs)  You should be screened for sexually transmitted infections (STIs) including gonorrhea and chlamydia if:  You are sexually active and are younger than 51 years of age.  You are older than 51 years of age and your health care provider tells you that you are at risk for this type of infection.  Your sexual activity has changed since you were last screened and you are at an increased risk for chlamydia or gonorrhea. Ask  your health care provider if you are at risk.  If you do not have HIV, but are at risk, it may be recommended that you take a prescription medicine daily to prevent HIV infection. This is called pre-exposure prophylaxis (PrEP). You are considered at risk if:  You are sexually active and do not regularly use condoms or know the HIV status of your partner(s).  You  take drugs by injection.  You are sexually active with a partner who has HIV. Talk with your health care provider about whether you are at high risk of being infected with HIV. If you choose to begin PrEP, you should first be tested for HIV. You should then be tested every 3 months for as long as you are taking PrEP.  PREGNANCY   If you are premenopausal and you may become pregnant, ask your health care provider about preconception counseling.  If you may become pregnant, take 400 to 800 micrograms (mcg) of folic acid every day.  If you want to prevent pregnancy, talk to your health care provider about birth control (contraception). OSTEOPOROSIS AND MENOPAUSE   Osteoporosis is a disease in which the bones lose minerals and strength with aging. This can result in serious bone fractures. Your risk for osteoporosis can be identified using a bone density scan.  If you are 79 years of age or older, or if you are at risk for osteoporosis and fractures, ask your health care provider if you should be screened.  Ask your health care provider whether you should take a calcium or vitamin D supplement to lower your risk for osteoporosis.  Menopause may have certain physical symptoms and risks.  Hormone replacement therapy may reduce some of these symptoms and risks. Talk to your health care provider about whether hormone replacement therapy is right for you.  HOME CARE INSTRUCTIONS   Schedule regular health, dental, and eye exams.  Stay current with your immunizations.   Do not use any tobacco products including cigarettes, chewing  tobacco, or electronic cigarettes.  If you are pregnant, do not drink alcohol.  If you are breastfeeding, limit how much and how often you drink alcohol.  Limit alcohol intake to no more than 1 drink per day for nonpregnant women. One drink equals 12 ounces of beer, 5 ounces of wine, or 1 ounces of hard liquor.  Do not use street drugs.  Do not share needles.  Ask your health care provider for help if you need support or information about quitting drugs.  Tell your health care provider if you often feel depressed.  Tell your health care provider if you have ever been abused or do not feel safe at home. Document Released: 04/21/2011 Document Revised: 02/20/2014 Document Reviewed: 09/07/2013 [facility] Patient Information 2015 [place], [place]. This information is not intended to replace advice given to you by your health care provider. Make sure you discuss any questions you have with your health care provider.

## 2014-06-27 NOTE — Progress Notes (Signed)
Subjective:    Patient ID: Janice Martin, female    DOB: 07/04/1961, 53 y.o.   MRN: [id number]  HPI  patient is here today for annual physical. Patient feels well and has no complaints.  Also reviewed chronic medical issues and interval medical events  Past Medical History  Diagnosis Date  . ANEMIA-IRON DEFICIENCY   . Immune thrombocytopenic purpura 2003 dx    s/p rituxan 06/2012 - now remission  . LEUKOCYTOSIS UNSPECIFIED   . Anxiety state, unspecified   . HYPERTENSION    Family History  Problem Relation Age of Onset  . Arthritis Mother   . Arthritis Father   . Mental illness Father   . Hypertension Other     Parent  . Heart disease Other     Grandparent  . Stroke Other     grandparent   History  Substance Use Topics  . Smoking status: Never Smoker   . Smokeless tobacco: Never Used     Comment: never used product  . Alcohol Use: No    Review of Systems  Constitutional: Negative for fatigue and unexpected weight change.  Respiratory: Negative for cough, shortness of breath and wheezing.   Cardiovascular: Negative for chest pain, palpitations and leg swelling.  Gastrointestinal: Negative for nausea, abdominal pain and diarrhea.  Neurological: Negative for dizziness, weakness, light-headedness and headaches.  Psychiatric/Behavioral: Negative for dysphoric mood. The patient is not nervous/anxious.   All other systems reviewed and are negative.      Objective:   Physical Exam  BP 142/90  Pulse 75  Temp(Src) 98.2 F (36.8 C) (Oral)  Ht 5\' 4"  (1.626 m)  Wt 202 lb (91.627 kg)  BMI 34.66 kg/m2  SpO2 97% Wt Readings from Last 3 Encounters:  06/27/14 202 lb (91.627 kg)  04/19/14 192 lb (87.091 kg)  01/18/14 187 lb (84.823 kg)   Constitutional: She is obese, and appears well-developed and well-nourished. No distress.  HENT: Head: Normocephalic and atraumatic. Ears: B TMs ok, no erythema or effusion; Nose: Nose normal. Mouth/Throat: Oropharynx is clear and  moist. No oropharyngeal exudate.  Eyes: Conjunctivae and EOM are normal. Pupils are equal, round, and reactive to light. No scleral icterus.  Neck: Thick, Normal range of motion. Neck supple. No JVD present. No thyromegaly present.  Cardiovascular: Normal rate, regular rhythm and normal heart sounds.  No murmur heard. No BLE edema. Pulmonary/Chest: Effort normal and breath sounds normal. No respiratory distress. She has no wheezes.  Abdominal: Soft. Bowel sounds are normal. She exhibits no distension. There is no tenderness. no masses GU/breast: defer to gyn Musculoskeletal: Normal range of motion, no joint effusions. No gross deformities Neurological: She is alert and oriented to person, place, and time. No cranial nerve deficit. Coordination, balance, strength, speech and gait are normal.  Skin: Skin is warm and dry. No rash noted. No erythema.  Psychiatric: She has a normal mood and affect. Her behavior is normal. Judgment and thought content normal.    Lab Results  Component Value Date   WBC 8.6 04/19/2014   HGB 14.2 04/19/2014   HCT 40.7 04/19/2014   PLT 356 04/19/2014   GLUCOSE 111* 02/18/2013   CHOL 190 02/18/2013   TRIG 89.0 02/18/2013   HDL 44.20 02/18/2013   LDLCALC 128* 02/18/2013   ALT 29 02/18/2013   AST 21 02/18/2013   NA 138 02/18/2013   K 3.9 02/18/2013   CL 101 02/18/2013   CREATININE 1.0 02/18/2013   BUN 16 02/18/2013   CO2  29 02/18/2013   TSH 0.55 02/18/2013   INR 0.9* 09/30/2010    Mr Angiogram Head Wo Contrast  06/05/2011   *RADIOLOGY REPORT*  Clinical Data:  Immune thrombocytopenic purpura.  Dizziness and right hand numbness.  MRI HEAD WITHOUT AND WITH CONTRAST MRA HEAD WITHOUT CONTRAST  Technique:  Multiplanar, multiecho pulse sequences of the brain and surrounding structures were obtained without and with intravenous contrast.  Angiographic images of the head were obtained using MRA technique without contrast.  Contrast: 18 ml Multihance IV  Comparison:  None.  MRI HEAD WITHOUT AND WITH  CONTRAST  Findings:  Ventricles are normal.  Negative for acute infarct. Small white matter hyperintensities in the left frontal white matter most likely related to chronic ischemic change.  Brainstem and cerebellum are intact.  Negative for hemorrhage or mass lesion.  Postcontrast imaging of the brain reveals normal enhancement.  Chronic sinusitis with mucosal thickening in the right maxillary sinus.  IMPRESSION: No acute intracranial abnormality.  MRA HEAD  Findings: Both vertebral arteries are patent to the basilar.  PICA, superior cerebellar, and posterior cerebral arteries are patent bilaterally.  Attenuation of the distal posterior cerebral artery bilaterally     related to artifact.  Internal carotid artery is widely patent bilaterally.  Patent posterior communicating artery bilaterally.  Anterior and middle cerebral arteries are patent bilaterally.  Negative for aneurysm.  IMPRESSION: Negative  Original Report Authenticated By: Truett Perna, M.D.  Mr Jeri [facility] Contrast  06/05/2011   *RADIOLOGY REPORT*  Clinical Data:  Immune thrombocytopenic purpura.  Dizziness and right hand numbness.  MRI HEAD WITHOUT AND WITH CONTRAST MRA HEAD WITHOUT CONTRAST  Technique:  Multiplanar, multiecho pulse sequences of the brain and surrounding structures were obtained without and with intravenous contrast.  Angiographic images of the head were obtained using MRA technique without contrast.  Contrast: 18 ml Multihance IV  Comparison:  None.  MRI HEAD WITHOUT AND WITH CONTRAST  Findings:  Ventricles are normal.  Negative for acute infarct. Small white matter hyperintensities in the left frontal white matter most likely related to chronic ischemic change.  Brainstem and cerebellum are intact.  Negative for hemorrhage or mass lesion.  Postcontrast imaging of the brain reveals normal enhancement.  Chronic sinusitis with mucosal thickening in the right maxillary sinus.  IMPRESSION: No acute intracranial abnormality.  MRA HEAD   Findings: Both vertebral arteries are patent to the basilar.  PICA, superior cerebellar, and posterior cerebral arteries are patent bilaterally.  Attenuation of the distal posterior cerebral artery bilaterally     related to artifact.  Internal carotid artery is widely patent bilaterally.  Patent posterior communicating artery bilaterally.  Anterior and middle cerebral arteries are patent bilaterally.  Negative for aneurysm.  IMPRESSION: Negative  Original Report Authenticated By: Truett Perna, M.D.      Assessment & Plan:   CPX/v70.0 - Patient has been counseled on age-appropriate routine health concerns for screening and prevention. These are reviewed and up-to-date. Immunizations are up-to-date or declined. Labs and reviewed.  Problem List Items Addressed This Visit   HYPERTENSION      BP Readings from Last 3 Encounters:  06/27/14 142/90  04/19/14 151/77  01/18/14 118/74   Pt reports great variability, including good control at home Reports compliance with bystolic and ARB/hct as rx'd the patient to continue to monitor and call if uncontrolled before next visit    New Home with menorrhagia and clotting fall 2011 Status post 6  weeks anticoagulation 09/2010, then ASA 81 mg daily, none since 2014 start of Rituxan Follows regularly with hematology Will be in remission 1 year on 07/20/14 S/p rituxan x 2 in 2014 and prev decadron+ eltombopag weekly for same     Other Visit Diagnoses   Routine general medical examination at a health care facility    -  Primary    Relevant Orders       Basic metabolic panel       CBC with Differential       Hepatic function panel       Lipid panel       TSH       Urinalysis, Routine w reflex microscopic    Special screening for malignant neoplasms, colon        Relevant Orders       Ambulatory referral to Gastroenterology

## 2014-06-27 NOTE — Assessment & Plan Note (Signed)
Associated with menorrhagia and clotting fall 2011 Status post 6 weeks anticoagulation 09/2010, then ASA 81 mg daily, none since 2014 start of Rituxan Follows regularly with hematology Will be in remission 1 year on 07/20/14 S/p rituxan x 2 in 2014 and prev decadron+ eltombopag weekly for same

## 2014-07-19 ENCOUNTER — Encounter: Payer: Self-pay | Admitting: Internal Medicine

## 2014-07-19 ENCOUNTER — Encounter: Payer: Self-pay | Admitting: Hematology & Oncology

## 2014-07-19 ENCOUNTER — Ambulatory Visit (HOSPITAL_BASED_OUTPATIENT_CLINIC_OR_DEPARTMENT_OTHER): Payer: 59 | Admitting: Hematology & Oncology

## 2014-07-19 ENCOUNTER — Other Ambulatory Visit (HOSPITAL_BASED_OUTPATIENT_CLINIC_OR_DEPARTMENT_OTHER): Payer: 59 | Admitting: Lab

## 2014-07-19 VITALS — BP 149/86 | HR 87 | Temp 97.6°F | Resp 14 | Ht 64.0 in | Wt 198.0 lb

## 2014-07-19 DIAGNOSIS — D473 Essential (hemorrhagic) thrombocythemia: Secondary | ICD-10-CM

## 2014-07-19 DIAGNOSIS — D693 Immune thrombocytopenic purpura: Secondary | ICD-10-CM

## 2014-07-19 LAB — CBC WITH DIFFERENTIAL (CANCER CENTER ONLY)
BASO#: 0 10*3/uL (ref 0.0–0.2)
BASO%: 0.3 % (ref 0.0–2.0)
EOS%: 5.7 % (ref 0.0–7.0)
Eosinophils Absolute: 0.6 10*3/uL — ABNORMAL HIGH (ref 0.0–0.5)
HEMATOCRIT: 40.1 % (ref 34.8–46.6)
HGB: 14 g/dL (ref 11.6–15.9)
LYMPH#: 2.4 10*3/uL (ref 0.9–3.3)
LYMPH%: 21.1 % (ref 14.0–48.0)
MCH: 32.6 pg (ref 26.0–34.0)
MCHC: 34.9 g/dL (ref 32.0–36.0)
MCV: 93 fL (ref 81–101)
MONO#: 0.7 10*3/uL (ref 0.1–0.9)
MONO%: 5.8 % (ref 0.0–13.0)
NEUT#: 7.5 10*3/uL — ABNORMAL HIGH (ref 1.5–6.5)
NEUT%: 67.1 % (ref 39.6–80.0)
Platelets: 193 10*3/uL (ref 145–400)
RBC: 4.3 10*6/uL (ref 3.70–5.32)
RDW: 13.7 % (ref 11.1–15.7)
WBC: 11.2 10*3/uL — ABNORMAL HIGH (ref 3.9–10.0)

## 2014-07-19 NOTE — Progress Notes (Signed)
Hematology and Oncology Follow Up Visit  Janice Martin [id number] May 12, 1961 53 y.o. 07/19/2014   Principle Diagnosis:   Chronic immune thrombocytopenia-recurrent  Current Therapy:    Observation     Interim History:  [name] is back for followup. She's widowed well. She's working. There haven't been no problems with bleeding or bruising. She's having no cough or shortness of breath. She's had no abdominal pain. There's been no issues with fever. There's been no rashes. She's had no leg swelling. She has had no headache. There has been no change in bowel or bladder habits.  Medications: Current outpatient prescriptions:nebivolol (BYSTOLIC) 5 MG tablet, Take 1 tablet (5 mg total) by mouth daily., Disp: 90 tablet, Rfl: 3;  valsartan-hydrochlorothiazide (DIOVAN-HCT) 320-25 MG per tablet, Take 1 tablet by mouth daily., Disp: 90 tablet, Rfl: 3  Allergies:  Allergies  Allergen Reactions  . Shellfish Allergy Anaphylaxis  . Influenza Vaccines     Per pt can't take mess up with her immune system  . Morphine     Past Medical History, Surgical history, Social history, and Family History were reviewed and updated.  Review of Systems: As above  Physical Exam:  height is 5\' 4"  (1.626 m) and weight is 198 lb (89.812 kg). Her oral temperature is 97.6 F (36.4 C). Her blood pressure is 149/86 and her pulse is 87. Her respiration is 14.   Well-developed and well-nourished white female. Head and neck exam shows no ocular or oral lesions. There is no adenopathy or neck. Lungs are clear. Cardiac exam regular in rhythm with no murmurs rubs or bruits. Abdomen has a well-healed slightly scar. She has no fluid wave. There is no palpable liver edge. Back exam no tenderness over the spine ribs or hips. Extremities no clubbing cyanosis or edema. Skin exam no rashes ecchymoses or petechia.  Lab Results  Component Value Date   WBC 11.2* 07/19/2014   HGB 14.0 07/19/2014   HCT 40.1 07/19/2014   MCV 93  07/19/2014   PLT 193 07/19/2014     Chemistry      Component Value Date/Time   NA 138 02/18/2013 0855   NA 136 03/03/2011 0804   K 3.9 02/18/2013 0855   K 4.4 03/03/2011 0804   CL 101 02/18/2013 0855   CL 97* 03/03/2011 0804   CO2 29 02/18/2013 0855   CO2 26 03/03/2011 0804   BUN 16 02/18/2013 0855   BUN 12 03/03/2011 0804   CREATININE 1.0 02/18/2013 0855   CREATININE 0.8 03/03/2011 0804      Component Value Date/Time   CALCIUM 9.2 02/18/2013 0855   CALCIUM 9.8 03/03/2011 0804   ALKPHOS 89 02/18/2013 0855   ALKPHOS 75 03/03/2011 0804   AST 21 02/18/2013 0855   AST 15 03/03/2011 0804   ALT 29 02/18/2013 0855   ALT 23 03/03/2011 0804   BILITOT 0.7 02/18/2013 0855   BILITOT 0.40 03/03/2011 0804      On her blood smear, her platelets appear normal in morphology. The blood smear shows good maturation of her platelets. She has good granulation of her platelets. White cells and red cells look okay.   Impression and Plan: Janice Martin is a 53 year old white female. She has a history of relapsed chronic immune thrombocytopenia. We treated her with Rituxan. She responded very well. She got treated back in October of 2014.  So far, she is remaining in remission  We will plan to see her back in 4 months now. She knows  to come in sooner if she thinks that her platelet count is going down.   Janice Napoleon, MD 9/30/20159:28 AM

## 2014-07-28 ENCOUNTER — Encounter: Payer: Self-pay | Admitting: Nurse Practitioner

## 2014-07-28 NOTE — Progress Notes (Signed)
Form faxed back to employee health for Flu exemption. Sent to [phone] and confirmation received. Also, mailed a copy to the patient.

## 2014-09-20 ENCOUNTER — Other Ambulatory Visit: Payer: Self-pay | Admitting: Internal Medicine

## 2014-11-15 ENCOUNTER — Other Ambulatory Visit (HOSPITAL_BASED_OUTPATIENT_CLINIC_OR_DEPARTMENT_OTHER): Payer: 59 | Admitting: Lab

## 2014-11-15 ENCOUNTER — Ambulatory Visit (HOSPITAL_BASED_OUTPATIENT_CLINIC_OR_DEPARTMENT_OTHER): Payer: 59 | Admitting: Hematology & Oncology

## 2014-11-15 ENCOUNTER — Encounter: Payer: Self-pay | Admitting: Hematology & Oncology

## 2014-11-15 VITALS — BP 150/69 | HR 92 | Temp 97.6°F | Resp 18 | Wt 196.0 lb

## 2014-11-15 DIAGNOSIS — D693 Immune thrombocytopenic purpura: Secondary | ICD-10-CM

## 2014-11-15 LAB — CBC WITH DIFFERENTIAL (CANCER CENTER ONLY)
BASO#: 0.1 10*3/uL (ref 0.0–0.2)
BASO%: 0.6 % (ref 0.0–2.0)
EOS%: 3.3 % (ref 0.0–7.0)
Eosinophils Absolute: 0.4 10*3/uL (ref 0.0–0.5)
HEMATOCRIT: 37.4 % (ref 34.8–46.6)
HEMOGLOBIN: 12.9 g/dL (ref 11.6–15.9)
LYMPH#: 2.5 10*3/uL (ref 0.9–3.3)
LYMPH%: 18.7 % (ref 14.0–48.0)
MCH: 32.3 pg (ref 26.0–34.0)
MCHC: 34.5 g/dL (ref 32.0–36.0)
MCV: 94 fL (ref 81–101)
MONO#: 1 10*3/uL — ABNORMAL HIGH (ref 0.1–0.9)
MONO%: 7.7 % (ref 0.0–13.0)
NEUT#: 9.3 10*3/uL — ABNORMAL HIGH (ref 1.5–6.5)
NEUT%: 69.7 % (ref 39.6–80.0)
PLATELETS: 62 10*3/uL — AB (ref 145–400)
RBC: 4 10*6/uL (ref 3.70–5.32)
RDW: 13.5 % (ref 11.1–15.7)
WBC: 13.4 10*3/uL — ABNORMAL HIGH (ref 3.9–10.0)

## 2014-11-15 LAB — TECHNOLOGIST REVIEW CHCC SATELLITE

## 2014-11-15 MED ORDER — DEXAMETHASONE 4 MG PO TABS
ORAL_TABLET | ORAL | Status: DC
Start: 2014-11-15 — End: 2015-04-04

## 2014-11-15 NOTE — Addendum Note (Signed)
Addended by: Burney Gauze R on: 11/15/2014 06:08 PM   Modules accepted: Orders, Level of Service, SmartSet

## 2014-11-15 NOTE — Addendum Note (Signed)
Addended by: Burney Gauze R on: 11/15/2014 10:19 AM   Modules accepted: Orders

## 2014-11-15 NOTE — Progress Notes (Addendum)
Hematology and Oncology Follow Up Visit  Janice Martin [id number] 1960/12/17 54 y.o. 11/15/2014   Principle Diagnosis:   Chronic immune thrombocytopenia-recurrent  Current Therapy:    Observation     Interim History:  [name] is back for followup. She is having a tough time right now. She is under a lot of stress at home. There is a lot of family issues that she is trying to deal with.  She's doing well at work. She works for radiation oncology.  She has noted a couple petechia on her arms. She's not seen anything on her legs. She did go ahead and take a 5 day course of Decadron just to be safe.  She last received Rituxan back in October 2014. So far, her responses been sustained.  She's had no bleeding. She's had no abdominal pain. There's been no cough. She's had no fever.   Medications:  Current outpatient prescriptions:  .  nebivolol (BYSTOLIC) 5 MG tablet, Take 1 tablet (5 mg total) by mouth daily., Disp: 90 tablet, Rfl: 3 .  valsartan-hydrochlorothiazide (DIOVAN-HCT) 320-25 MG per tablet, Take 1 tablet by mouth daily., Disp: 90 tablet, Rfl: 3 .  valsartan-hydrochlorothiazide (DIOVAN-HCT) 320-25 MG per tablet, TAKE 1 TABLET BY MOUTH DAILY., Disp: 90 tablet, Rfl: 2  Allergies:  Allergies  Allergen Reactions  . Shellfish Allergy Anaphylaxis  . Influenza Vaccines     Per pt can't take mess up with her immune system  . Morphine     Past Medical History, Surgical history, Social history, and Family History were reviewed and updated.  Review of Systems: As above  Physical Exam:  weight is 196 lb (88.905 kg). Her oral temperature is 97.6 F (36.4 C). Her blood pressure is 150/69 and her pulse is 92. Her respiration is 18.   Well-developed and well-nourished white female. Head and neck exam shows no ocular or oral lesions. There is no adenopathy or neck. Lungs are clear. Cardiac exam regular rate and rhythm with no murmurs rubs or bruits. Abdomen has a well-healed  slightly scar. She has no fluid wave. There is no palpable liver edge. Back exam no tenderness over the spine ribs or hips. Extremities no clubbing cyanosis or edema. Skin exam no rashes ecchymoses or petechia.  Lab Results  Component Value Date   WBC 11.2* 07/19/2014   HGB 14.0 07/19/2014   HCT 40.1 07/19/2014   MCV 93 07/19/2014   PLT 193 07/19/2014     Chemistry      Component Value Date/Time   NA 138 02/18/2013 0855   NA 136 03/03/2011 0804   K 3.9 02/18/2013 0855   K 4.4 03/03/2011 0804   CL 101 02/18/2013 0855   CL 97* 03/03/2011 0804   CO2 29 02/18/2013 0855   CO2 26 03/03/2011 0804   BUN 16 02/18/2013 0855   BUN 12 03/03/2011 0804   CREATININE 1.0 02/18/2013 0855   CREATININE 0.8 03/03/2011 0804      Component Value Date/Time   CALCIUM 9.2 02/18/2013 0855   CALCIUM 9.8 03/03/2011 0804   ALKPHOS 89 02/18/2013 0855   ALKPHOS 75 03/03/2011 0804   AST 21 02/18/2013 0855   AST 15 03/03/2011 0804   ALT 29 02/18/2013 0855   ALT 23 03/03/2011 0804   BILITOT 0.7 02/18/2013 0855   BILITOT 0.40 03/03/2011 0804       Impression and Plan: Janice Martin is a 54 year old white female. She has a history of relapsed chronic immune thrombocytopenia. We treated  her with Rituxan. She responded very well. She got treated back in October of 2014.  Unfortunatly, she appears to be relapsing again. Her blood count is down to 62,000. I suspect it probably was lower but she took her steroids. Steroids seem to help her.  I think that Rituxan again not be a bad idea. Again, we got over a year out of it. I think we can probably get another response out of it also. I think the other possibility would be either Nplate or Promacta. These also could be considered.  I called her up. I told her to take some Decadron for the next 5 days. We will check her platelet count every 2 weeks.  She does not want to get started with Rituxan until March. I will set this up.  I will plan to see her back when  we start the Rituxan in March.    [name], MD 1/27/20168:42 AM

## 2014-11-27 ENCOUNTER — Other Ambulatory Visit: Payer: Self-pay | Admitting: Family

## 2014-12-01 ENCOUNTER — Other Ambulatory Visit (HOSPITAL_BASED_OUTPATIENT_CLINIC_OR_DEPARTMENT_OTHER): Payer: 59

## 2014-12-01 DIAGNOSIS — D693 Immune thrombocytopenic purpura: Secondary | ICD-10-CM

## 2014-12-01 LAB — CBC WITH DIFFERENTIAL/PLATELET
BASO%: 0.1 % (ref 0.0–2.0)
Basophils Absolute: 0 10*3/uL (ref 0.0–0.1)
EOS%: 0.1 % (ref 0.0–7.0)
Eosinophils Absolute: 0 10*3/uL (ref 0.0–0.5)
HEMATOCRIT: 41 % (ref 34.8–46.6)
HGB: 13.3 g/dL (ref 11.6–15.9)
LYMPH%: 12.1 % — ABNORMAL LOW (ref 14.0–49.7)
MCH: 31 pg (ref 25.1–34.0)
MCHC: 32.3 g/dL (ref 31.5–36.0)
MCV: 95.8 fL (ref 79.5–101.0)
MONO#: 0.7 10*3/uL (ref 0.1–0.9)
MONO%: 4.4 % (ref 0.0–14.0)
NEUT%: 83.3 % — AB (ref 38.4–76.8)
NEUTROS ABS: 12.9 10*3/uL — AB (ref 1.5–6.5)
Platelets: 168 10*3/uL (ref 145–400)
RBC: 4.28 10*6/uL (ref 3.70–5.45)
RDW: 13.9 % (ref 11.2–14.5)
WBC: 15.5 10*3/uL — AB (ref 3.9–10.3)
lymph#: 1.9 10*3/uL (ref 0.9–3.3)

## 2014-12-15 ENCOUNTER — Other Ambulatory Visit (HOSPITAL_BASED_OUTPATIENT_CLINIC_OR_DEPARTMENT_OTHER): Payer: 59

## 2014-12-15 DIAGNOSIS — D693 Immune thrombocytopenic purpura: Secondary | ICD-10-CM

## 2014-12-15 LAB — CBC WITH DIFFERENTIAL/PLATELET
BASO%: 0.1 % (ref 0.0–2.0)
BASOS ABS: 0 10*3/uL (ref 0.0–0.1)
EOS%: 0 % (ref 0.0–7.0)
Eosinophils Absolute: 0 10*3/uL (ref 0.0–0.5)
HCT: 39 % (ref 34.8–46.6)
HGB: 13.5 g/dL (ref 11.6–15.9)
LYMPH%: 11.5 % — ABNORMAL LOW (ref 14.0–49.7)
MCH: 32.1 pg (ref 25.1–34.0)
MCHC: 34.6 g/dL (ref 31.5–36.0)
MCV: 92.9 fL (ref 79.5–101.0)
MONO#: 0.5 10*3/uL (ref 0.1–0.9)
MONO%: 3.2 % (ref 0.0–14.0)
NEUT#: 13.4 10*3/uL — ABNORMAL HIGH (ref 1.5–6.5)
NEUT%: 85.2 % — ABNORMAL HIGH (ref 38.4–76.8)
NRBC: 0 % (ref 0–0)
Platelets: 178 10*3/uL (ref 145–400)
RBC: 4.2 10*6/uL (ref 3.70–5.45)
RDW: 14 % (ref 11.2–14.5)
WBC: 15.7 10*3/uL — ABNORMAL HIGH (ref 3.9–10.3)
lymph#: 1.8 10*3/uL (ref 0.9–3.3)

## 2014-12-22 ENCOUNTER — Other Ambulatory Visit (HOSPITAL_BASED_OUTPATIENT_CLINIC_OR_DEPARTMENT_OTHER): Payer: 59 | Admitting: Lab

## 2014-12-22 ENCOUNTER — Ambulatory Visit: Payer: 59

## 2014-12-22 ENCOUNTER — Encounter: Payer: Self-pay | Admitting: Hematology & Oncology

## 2014-12-22 ENCOUNTER — Ambulatory Visit (HOSPITAL_BASED_OUTPATIENT_CLINIC_OR_DEPARTMENT_OTHER): Payer: 59 | Admitting: Hematology & Oncology

## 2014-12-22 VITALS — BP 172/89 | HR 102 | Temp 98.2°F | Resp 16 | Ht 64.0 in | Wt 203.0 lb

## 2014-12-22 DIAGNOSIS — D693 Immune thrombocytopenic purpura: Secondary | ICD-10-CM

## 2014-12-22 LAB — CBC WITH DIFFERENTIAL (CANCER CENTER ONLY)
BASO#: 0.1 10*3/uL (ref 0.0–0.2)
BASO%: 0.7 % (ref 0.0–2.0)
EOS%: 2.8 % (ref 0.0–7.0)
Eosinophils Absolute: 0.4 10*3/uL (ref 0.0–0.5)
HCT: 37.3 % (ref 34.8–46.6)
HGB: 12.6 g/dL (ref 11.6–15.9)
LYMPH#: 2.6 10*3/uL (ref 0.9–3.3)
LYMPH%: 19.1 % (ref 14.0–48.0)
MCH: 32.3 pg (ref 26.0–34.0)
MCHC: 33.8 g/dL (ref 32.0–36.0)
MCV: 96 fL (ref 81–101)
MONO#: 1 10*3/uL — ABNORMAL HIGH (ref 0.1–0.9)
MONO%: 7.3 % (ref 0.0–13.0)
NEUT#: 9.4 10*3/uL — ABNORMAL HIGH (ref 1.5–6.5)
NEUT%: 70.1 % (ref 39.6–80.0)
PLATELETS: 297 10*3/uL (ref 145–400)
RBC: 3.9 10*6/uL (ref 3.70–5.32)
RDW: 14 % (ref 11.1–15.7)
WBC: 13.4 10*3/uL — ABNORMAL HIGH (ref 3.9–10.0)

## 2014-12-22 LAB — CHCC SATELLITE - SMEAR

## 2014-12-22 LAB — TECHNOLOGIST REVIEW CHCC SATELLITE

## 2014-12-22 NOTE — Progress Notes (Signed)
Hematology and Oncology Follow Up Visit  Janice Martin [id number] [birth year] 54 y.o. 12/22/2014   Principle Diagnosis:   Chronic immune thrombocytopenia-recurrent  Current Therapy:    Observation     Interim History:  Janice Martin is back for followup. She is doing better. There is not as much stress with her family now. Back and we saw her in January, her mother was not doing too well. There were a lot of issues with some of her family members.  She had been on some Decadron for the thrombocytopenia. She is not taking any Decadron this week.  She has responded nicely. Her plantar count has come up.  She was to start Rituxan today. However, as a with a platelet count responding well to steroids, I just do not think that we have to put her on Rituxan.  She's had no bleeding. She's had no bruising. She's had no change in bowel or bladder habits. She's had no cough.  Overall, her performance status is ECOG 0.  Medications:  Current outpatient prescriptions:  .  nebivolol (BYSTOLIC) 5 MG tablet, Take 1 tablet (5 mg total) by mouth daily., Disp: 90 tablet, Rfl: 3 .  valsartan-hydrochlorothiazide (DIOVAN-HCT) 320-25 MG per tablet, Take 1 tablet by mouth daily., Disp: 90 tablet, Rfl: 3 .  dexamethasone (DECADRON) 4 MG tablet, Take as directed. (Patient not taking: Reported on 12/22/2014), Disp: 60 tablet, Rfl: 2  Allergies:  Allergies  Allergen Reactions  . Shellfish Allergy Anaphylaxis  . Influenza Vaccines     Per pt can't take mess up with her immune system  . Morphine     Past Medical History, Surgical history, Social history, and Family History were reviewed and updated.  Review of Systems: As above  Physical Exam:  height is 5\' 4"  (1.626 m) and weight is 203 lb (92.08 kg). Her oral temperature is 98.2 F (36.8 C). Her blood pressure is 172/89 and her pulse is 102. Her respiration is 16.   Well-developed and well-nourished white female. Head and neck exam shows no ocular or  oral lesions. There is no adenopathy or neck. Lungs are clear. Cardiac exam regular rate and rhythm with no murmurs rubs or bruits. Abdomen has a well-healed splenectomy scar. She has no fluid wave. There is no palpable liver edge. Back exam no tenderness over the spine ribs or hips. Extremities no clubbing cyanosis or edema. Skin exam no rashes ecchymoses or petechia.  Lab Results  Component Value Date   WBC 13.4* 12/22/2014   HGB 12.6 12/22/2014   HCT 37.3 12/22/2014   MCV 96 12/22/2014   PLT 297 12/22/2014     Chemistry      Component Value Date/Time   NA 138 02/18/2013 0855   NA 136 03/03/2011 0804   K 3.9 02/18/2013 0855   K 4.4 03/03/2011 0804   CL 101 02/18/2013 0855   CL 97* 03/03/2011 0804   CO2 29 02/18/2013 0855   CO2 26 03/03/2011 0804   BUN 16 02/18/2013 0855   BUN 12 03/03/2011 0804   CREATININE 1.0 02/18/2013 0855   CREATININE 0.8 03/03/2011 0804      Component Value Date/Time   CALCIUM 9.2 02/18/2013 0855   CALCIUM 9.8 03/03/2011 0804   ALKPHOS 89 02/18/2013 0855   ALKPHOS 75 03/03/2011 0804   AST 21 02/18/2013 0855   AST 15 03/03/2011 0804   ALT 29 02/18/2013 0855   ALT 23 03/03/2011 0804   BILITOT 0.7 02/18/2013 0855   BILITOT  0.40 03/03/2011 0804       Impression and Plan: Janice Martin is a 54 year old white female. She has a history of relapsed chronic immune thrombocytopenia. We treated her with Rituxan. She responded very well. She got treated back in October of 2014.  Again, I think we can just watch her. I told her to not taking Decadron right now. She was taking it every other day or so. Again, her platelet count has come up quite well. As such, I think we can hold off on the Rituxan.  We will check her platelet count every 2 weeks. She will have this done at the main [place] where she works.  I will see her back in 6 weeks.    [name], MD 3/4/201610:25 AM

## 2014-12-29 ENCOUNTER — Other Ambulatory Visit: Payer: 59 | Admitting: Lab

## 2014-12-29 ENCOUNTER — Ambulatory Visit: Payer: 59

## 2014-12-30 ENCOUNTER — Encounter (HOSPITAL_COMMUNITY): Payer: Self-pay | Admitting: Emergency Medicine

## 2014-12-30 ENCOUNTER — Emergency Department (HOSPITAL_COMMUNITY)
Admission: EM | Admit: 2014-12-30 | Discharge: 2014-12-30 | Disposition: A | Discharge status: Home / Self Care | Payer: 59 | Source: Home / Self Care | Attending: Family Medicine | Admitting: Family Medicine

## 2014-12-30 DIAGNOSIS — K047 Periapical abscess without sinus: Secondary | ICD-10-CM

## 2014-12-30 MED ORDER — CLINDAMYCIN HCL 300 MG PO CAPS: 300.0000 mg | ORAL_CAPSULE | Freq: Three times a day (TID) | ORAL | Status: DC

## 2014-12-30 NOTE — ED Notes (Signed)
C/o left side facial swelling onset yest am Reports she has had a cold x1 week Denies pain, dental pain, numbness Alert, no signs of acute distress.

## 2014-12-30 NOTE — Discharge Instructions (Signed)

## 2014-12-30 NOTE — ED Provider Notes (Signed)
CSN: [id number]     Arrival date & time 12/30/14  0945 History   First MD Initiated Contact with Patient 12/30/14 1051     Chief Complaint  Patient presents with  . Facial Swelling   (Consider location/radiation/quality/duration/timing/severity/associated sxs/prior Treatment) HPI Comments: Patient reports 24 hours of left facial swelling and tenderness with associated URI sx. States multiple family members ill with URIs. Denies fever. Nonsmoker Works in radiation oncology at [employer]  The history is provided by the patient.    Past Medical History  Diagnosis Date  . ANEMIA-IRON DEFICIENCY   . Immune thrombocytopenic purpura 2003 dx    s/p rituxan 06/2012 - now remission  . LEUKOCYTOSIS UNSPECIFIED   . Anxiety state, unspecified   . HYPERTENSION    Past Surgical History  Procedure Laterality Date  . Cholecystectomy    . Spleenectomy    . Cesarean section      x's 2   Family History  Problem Relation Age of Onset  . Arthritis Mother   . Arthritis Father   . Mental illness Father   . Hypertension Other     Parent  . Heart disease Other     Grandparent  . Stroke Other     grandparent   History  Substance Use Topics  . Smoking status: Never Smoker   . Smokeless tobacco: Never Used     Comment: never used product  . Alcohol Use: No   OB History    No data available     Review of Systems  Constitutional: Negative for fever, chills and fatigue.  HENT: Positive for congestion, dental problem, facial swelling, mouth sores, rhinorrhea and sneezing. Negative for ear pain, nosebleeds, postnasal drip and sore throat.   Eyes: Negative.   Respiratory: Positive for cough. Negative for chest tightness, shortness of breath and wheezing.   Cardiovascular: Negative.   Gastrointestinal: Negative.   Musculoskeletal: Negative for back pain and neck pain.  Skin: Negative.   Neurological: Negative.     Allergies  Shellfish allergy; Influenza vaccines; and  Morphine  Home Medications   Prior to Admission medications   Medication Sig Start Date End Date Taking? Authorizing Provider  nebivolol (BYSTOLIC) 5 MG tablet Take 1 tablet (5 mg total) by mouth daily. 06/27/14  Yes Rowe Clack, MD  valsartan-hydrochlorothiazide (DIOVAN-HCT) 320-25 MG per tablet Take 1 tablet by mouth daily. 06/27/14  Yes Rowe Clack, MD  clindamycin (CLEOCIN) 300 MG capsule Take 1 capsule (300 mg total) by mouth 3 (three) times daily. 12/30/14   Audelia Hives Presson, PA  dexamethasone (DECADRON) 4 MG tablet Take as directed. Patient not taking: Reported on 12/22/2014 11/15/14   [name], MD   BP 128/88 mmHg  Pulse 97  Temp(Src) 98.2 F (36.8 C) (Oral)  Resp 18  SpO2 96%  LMP 12/04/2014 Physical Exam  Constitutional: She is oriented to person, place, and time. She appears well-developed and well-nourished. No distress.  HENT:  Head: Normocephalic and atraumatic.    Right Ear: Hearing, tympanic membrane, external ear and ear canal normal.  Left Ear: Hearing, tympanic membrane, external ear and ear canal normal.  Nose: Nose normal.  Mouth/Throat: Oropharynx is clear and moist and mucous membranes are normal. She does not have dentures. No oral lesions. No trismus in the jaw. Abnormal dentition. Dental abscesses and dental caries present. No uvula swelling or lacerations.    Outlined area is region of mild facial swelling, tenderness and erythema. Patient has widespread  dental decay  Eyes: Conjunctivae, EOM and lids are normal. Pupils are equal, round, and reactive to light. Right eye exhibits no discharge. Left eye exhibits no discharge. No scleral icterus.  Neck: Normal range of motion. Neck supple.  Cardiovascular: Normal rate, regular rhythm and normal heart sounds.   Pulmonary/Chest: Effort normal and breath sounds normal.  Musculoskeletal: Normal range of motion.  Lymphadenopathy:    She has no cervical adenopathy.  Neurological: She is  alert and oriented to person, place, and time.  Skin: Skin is warm and dry.  Psychiatric: She has a normal mood and affect. Her behavior is normal.  Nursing note and vitals reviewed.   ED Course  Procedures (including critical care time) Labs Review Labs Reviewed - No data to display  Imaging Review No results found.   MDM   1. Dental abscess   While patient does have mild common cold, explained to her that her facial pain and swelling were likely related to dental issue and probable periapical abscess. Will begin treatment with oral clindamycin and advise close follow up with dentist of her choice as soon as possible. Provided patient with list of low cost dental providers in the area.     Lutricia Feil, [place] 12/30/14 1255

## 2015-01-03 ENCOUNTER — Telehealth: Payer: Self-pay | Admitting: Hematology & Oncology

## 2015-01-03 NOTE — Telephone Encounter (Signed)
Faxed medical records to:  [place] P: [phone] F: [phone]  Ref: [phone] [phone]    COPY SCANNED

## 2015-01-05 ENCOUNTER — Ambulatory Visit: Payer: 59

## 2015-01-05 ENCOUNTER — Other Ambulatory Visit (HOSPITAL_BASED_OUTPATIENT_CLINIC_OR_DEPARTMENT_OTHER): Payer: 59

## 2015-01-05 ENCOUNTER — Other Ambulatory Visit: Payer: 59 | Admitting: Lab

## 2015-01-05 DIAGNOSIS — D693 Immune thrombocytopenic purpura: Secondary | ICD-10-CM

## 2015-01-05 LAB — CBC WITH DIFFERENTIAL/PLATELET
BASO%: 0.9 % (ref 0.0–2.0)
Basophils Absolute: 0.1 10*3/uL (ref 0.0–0.1)
EOS ABS: 0.5 10*3/uL (ref 0.0–0.5)
EOS%: 3.9 % (ref 0.0–7.0)
HCT: 39 % (ref 34.8–46.6)
HGB: 13.4 g/dL (ref 11.6–15.9)
LYMPH%: 29.6 % (ref 14.0–49.7)
MCH: 31.8 pg (ref 25.1–34.0)
MCHC: 34.4 g/dL (ref 31.5–36.0)
MCV: 92.4 fL (ref 79.5–101.0)
MONO#: 1 10*3/uL — AB (ref 0.1–0.9)
MONO%: 7.8 % (ref 0.0–14.0)
NEUT#: 7.4 10*3/uL — ABNORMAL HIGH (ref 1.5–6.5)
NEUT%: 57.8 % (ref 38.4–76.8)
NRBC: 0 % (ref 0–0)
PLATELETS: 360 10*3/uL (ref 145–400)
RBC: 4.22 10*6/uL (ref 3.70–5.45)
RDW: 13.9 % (ref 11.2–14.5)
WBC: 12.8 10*3/uL — AB (ref 3.9–10.3)
lymph#: 3.8 10*3/uL — ABNORMAL HIGH (ref 0.9–3.3)

## 2015-01-12 ENCOUNTER — Ambulatory Visit: Payer: 59

## 2015-01-12 ENCOUNTER — Other Ambulatory Visit: Payer: 59 | Admitting: Lab

## 2015-01-12 ENCOUNTER — Ambulatory Visit: Payer: 59 | Admitting: Hematology & Oncology

## 2015-01-19 ENCOUNTER — Other Ambulatory Visit (HOSPITAL_BASED_OUTPATIENT_CLINIC_OR_DEPARTMENT_OTHER): Payer: 59

## 2015-01-19 DIAGNOSIS — D693 Immune thrombocytopenic purpura: Secondary | ICD-10-CM | POA: Diagnosis not present

## 2015-01-19 LAB — CBC WITH DIFFERENTIAL/PLATELET
BASO%: 1 % (ref 0.0–2.0)
Basophils Absolute: 0.1 10*3/uL (ref 0.0–0.1)
EOS%: 5.9 % (ref 0.0–7.0)
Eosinophils Absolute: 0.8 10*3/uL — ABNORMAL HIGH (ref 0.0–0.5)
HEMATOCRIT: 41.3 % (ref 34.8–46.6)
HGB: 13.6 g/dL (ref 11.6–15.9)
LYMPH#: 3.4 10*3/uL — AB (ref 0.9–3.3)
LYMPH%: 25.4 % (ref 14.0–49.7)
MCH: 31 pg (ref 25.1–34.0)
MCHC: 33 g/dL (ref 31.5–36.0)
MCV: 94.1 fL (ref 79.5–101.0)
MONO#: 1.1 10*3/uL — ABNORMAL HIGH (ref 0.1–0.9)
MONO%: 8.2 % (ref 0.0–14.0)
NEUT#: 7.9 10*3/uL — ABNORMAL HIGH (ref 1.5–6.5)
NEUT%: 59.5 % (ref 38.4–76.8)
Platelets: 173 10*3/uL (ref 145–400)
RBC: 4.39 10*6/uL (ref 3.70–5.45)
RDW: 14 % (ref 11.2–14.5)
WBC: 13.3 10*3/uL — AB (ref 3.9–10.3)

## 2015-02-02 ENCOUNTER — Other Ambulatory Visit (HOSPITAL_BASED_OUTPATIENT_CLINIC_OR_DEPARTMENT_OTHER): Payer: 59

## 2015-02-02 ENCOUNTER — Ambulatory Visit (HOSPITAL_BASED_OUTPATIENT_CLINIC_OR_DEPARTMENT_OTHER): Payer: 59 | Admitting: Hematology & Oncology

## 2015-02-02 ENCOUNTER — Encounter: Payer: Self-pay | Admitting: Hematology & Oncology

## 2015-02-02 VITALS — BP 154/79 | HR 89 | Temp 98.2°F | Resp 16 | Ht 64.0 in | Wt 195.0 lb

## 2015-02-02 DIAGNOSIS — D509 Iron deficiency anemia, unspecified: Secondary | ICD-10-CM | POA: Diagnosis not present

## 2015-02-02 DIAGNOSIS — D693 Immune thrombocytopenic purpura: Secondary | ICD-10-CM

## 2015-02-02 LAB — CBC WITH DIFFERENTIAL (CANCER CENTER ONLY)
BASO#: 0.1 10*3/uL (ref 0.0–0.2)
BASO%: 0.4 % (ref 0.0–2.0)
EOS%: 8.9 % — ABNORMAL HIGH (ref 0.0–7.0)
Eosinophils Absolute: 1.3 10*3/uL — ABNORMAL HIGH (ref 0.0–0.5)
HEMATOCRIT: 27.4 % — AB (ref 34.8–46.6)
HGB: 9.5 g/dL — ABNORMAL LOW (ref 11.6–15.9)
LYMPH#: 2.4 10*3/uL (ref 0.9–3.3)
LYMPH%: 16.9 % (ref 14.0–48.0)
MCH: 32 pg (ref 26.0–34.0)
MCHC: 34.7 g/dL (ref 32.0–36.0)
MCV: 92 fL (ref 81–101)
MONO#: 1.1 10*3/uL — ABNORMAL HIGH (ref 0.1–0.9)
MONO%: 7.4 % (ref 0.0–13.0)
NEUT#: 9.5 10*3/uL — ABNORMAL HIGH (ref 1.5–6.5)
NEUT%: 66.4 % (ref 39.6–80.0)
PLATELETS: 86 10*3/uL — AB (ref 145–400)
RBC: 2.97 10*6/uL — ABNORMAL LOW (ref 3.70–5.32)
RDW: 13.2 % (ref 11.1–15.7)
WBC: 14.3 10*3/uL — ABNORMAL HIGH (ref 3.9–10.0)

## 2015-02-02 LAB — CHCC SATELLITE - SMEAR

## 2015-02-02 LAB — TECHNOLOGIST REVIEW CHCC SATELLITE

## 2015-02-02 NOTE — Progress Notes (Signed)
Hematology and Oncology Follow Up Visit  Janice Martin [id number] May 06, 1961 54 y.o. 02/02/2015   Principle Diagnosis:   Chronic immune thrombocytopenia-recurrent  Current Therapy:    Observation     Interim History:  [name] is back for followup. She is doing pre-well. She did strain Janice right calf last weekend. I think she may have strained the plantaris muscle. She is able to get around okay.  His been no bleeding. She's had no bruising.  The issues with Janice family are being resolved. Janice Martin has been evicted from Janice mother's house. They are now looking for a new place to live.  She's working although she is under a lot of stress at work.  She's had no nausea or vomiting. There's been no rashes. She's had no leg swelling. She's had no cough. She's had no fever.   Overall, Janice performance status is ECOG 0.  Medications:  Current outpatient prescriptions:  .  dexamethasone (DECADRON) 4 MG tablet, Take as directed., Disp: 60 tablet, Rfl: 2 .  nebivolol (BYSTOLIC) 5 MG tablet, Take 1 tablet (5 mg total) by mouth daily., Disp: 90 tablet, Rfl: 3 .  valsartan-hydrochlorothiazide (DIOVAN-HCT) 320-25 MG per tablet, Take 1 tablet by mouth daily., Disp: 90 tablet, Rfl: 3  Allergies:  Allergies  Allergen Reactions  . Shellfish Allergy Anaphylaxis  . Influenza Vaccines     Per pt can't take mess up with Janice immune system  . Morphine     Past Medical History, Surgical history, Social history, and Family History were reviewed and updated.  Review of Systems: As above  Physical Exam:  height is 5\' 4"  (1.626 m) and weight is 195 lb (88.451 kg). Janice oral temperature is 98.2 F (36.8 C). Janice blood pressure is 154/79 and Janice pulse is 89. Janice respiration is 16.   Well-developed and well-nourished white female. Head and neck exam shows no ocular or oral lesions. There is no adenopathy of the neck. Lungs are clear. Cardiac exam regular rate and rhythm with no murmurs rubs or  bruits. Abdomen has a well-healed splenectomy scar. She has no fluid wave. There is no palpable liver edge. Back exam no tenderness over the spine ribs or hips. Extremities no clubbing cyanosis or edema. Skin exam no rashes ecchymoses or petechia.  Lab Results  Component Value Date   WBC 14.3* 02/02/2015   HGB 9.5* 02/02/2015   HCT 27.4* 02/02/2015   MCV 92 02/02/2015   PLT 86* 02/02/2015     Chemistry      Component Value Date/Time   NA 138 02/18/2013 0855   NA 136 03/03/2011 0804   K 3.9 02/18/2013 0855   K 4.4 03/03/2011 0804   CL 101 02/18/2013 0855   CL 97* 03/03/2011 0804   CO2 29 02/18/2013 0855   CO2 26 03/03/2011 0804   BUN 16 02/18/2013 0855   BUN 12 03/03/2011 0804   CREATININE 1.0 02/18/2013 0855   CREATININE 0.8 03/03/2011 0804      Component Value Date/Time   CALCIUM 9.2 02/18/2013 0855   CALCIUM 9.8 03/03/2011 0804   ALKPHOS 89 02/18/2013 0855   ALKPHOS 75 03/03/2011 0804   AST 21 02/18/2013 0855   AST 15 03/03/2011 0804   ALT 29 02/18/2013 0855   ALT 23 03/03/2011 0804   BILITOT 0.7 02/18/2013 0855   BILITOT 0.40 03/03/2011 0804       Impression and Plan: Janice Martin is a 54 year old white female. She has a history of  relapsed chronic immune thrombocytopenia. We treated Janice with Rituxan. She responded very well. She got treated back in October of 2014.  Janice plantar count is going back down again. She also is anemic. She's asymptomatic.  Typically, she takes Decadron at home. She has Janice own regimen that she takes which seems to help Janice.  We will plan for weekly labs. Since she works over at the [employer], will have the labs done there.   I will see Janice back in 4 weeks.    [name], MD 4/15/20169:23 AM

## 2015-02-09 ENCOUNTER — Other Ambulatory Visit (HOSPITAL_BASED_OUTPATIENT_CLINIC_OR_DEPARTMENT_OTHER): Payer: 59

## 2015-02-09 DIAGNOSIS — D649 Anemia, unspecified: Secondary | ICD-10-CM | POA: Diagnosis not present

## 2015-02-09 DIAGNOSIS — D509 Iron deficiency anemia, unspecified: Secondary | ICD-10-CM

## 2015-02-09 DIAGNOSIS — D693 Immune thrombocytopenic purpura: Secondary | ICD-10-CM

## 2015-02-09 LAB — CBC & DIFF AND RETIC
BASO%: 0.1 % (ref 0.0–2.0)
Basophils Absolute: 0 10*3/uL (ref 0.0–0.1)
EOS%: 3.1 % (ref 0.0–7.0)
Eosinophils Absolute: 0.6 10*3/uL — ABNORMAL HIGH (ref 0.0–0.5)
HCT: 37.1 % (ref 34.8–46.6)
HGB: 12.9 g/dL (ref 11.6–15.9)
Immature Retic Fract: 10.9 % — ABNORMAL HIGH (ref 1.60–10.00)
LYMPH#: 1.5 10*3/uL (ref 0.9–3.3)
LYMPH%: 7.8 % — ABNORMAL LOW (ref 14.0–49.7)
MCH: 31.6 pg (ref 25.1–34.0)
MCHC: 34.8 g/dL (ref 31.5–36.0)
MCV: 90.9 fL (ref 79.5–101.0)
MONO#: 0.6 10*3/uL (ref 0.1–0.9)
MONO%: 3.4 % (ref 0.0–14.0)
NEUT#: 16.3 10*3/uL — ABNORMAL HIGH (ref 1.5–6.5)
NEUT%: 85.6 % — AB (ref 38.4–76.8)
RBC: 4.08 10*6/uL (ref 3.70–5.45)
RDW: 13.7 % (ref 11.2–14.5)
RETIC %: 2.35 % — AB (ref 0.70–2.10)
RETIC CT ABS: 95.88 10*3/uL — AB (ref 33.70–90.70)
WBC: 19 10*3/uL — ABNORMAL HIGH (ref 3.9–10.3)

## 2015-02-09 LAB — LACTATE DEHYDROGENASE (CC13): LDH: 232 U/L (ref 125–245)

## 2015-02-09 LAB — CHCC SMEAR

## 2015-02-09 LAB — IRON AND TIBC CHCC
%SAT: 30 % (ref 21–57)
IRON: 71 ug/dL (ref 41–142)
TIBC: 238 ug/dL (ref 236–444)
UIBC: 166 ug/dL (ref 120–384)

## 2015-02-09 LAB — TECHNOLOGIST REVIEW

## 2015-02-09 LAB — FERRITIN CHCC: Ferritin: 138 ng/ml (ref 9–269)

## 2015-02-10 LAB — HAPTOGLOBIN: Haptoglobin: 250 mg/dL — ABNORMAL HIGH (ref 43–212)

## 2015-02-16 ENCOUNTER — Other Ambulatory Visit (HOSPITAL_BASED_OUTPATIENT_CLINIC_OR_DEPARTMENT_OTHER): Payer: 59

## 2015-02-16 DIAGNOSIS — D693 Immune thrombocytopenic purpura: Secondary | ICD-10-CM | POA: Diagnosis not present

## 2015-02-16 DIAGNOSIS — D509 Iron deficiency anemia, unspecified: Secondary | ICD-10-CM

## 2015-02-16 DIAGNOSIS — D649 Anemia, unspecified: Secondary | ICD-10-CM | POA: Diagnosis not present

## 2015-02-16 LAB — CBC WITH DIFFERENTIAL/PLATELET
BASO%: 0.2 % (ref 0.0–2.0)
Basophils Absolute: 0 10*3/uL (ref 0.0–0.1)
EOS ABS: 0 10*3/uL (ref 0.0–0.5)
EOS%: 0 % (ref 0.0–7.0)
HCT: 43.4 % (ref 34.8–46.6)
HEMOGLOBIN: 15.2 g/dL (ref 11.6–15.9)
LYMPH%: 12.5 % — AB (ref 14.0–49.7)
MCH: 32.1 pg (ref 25.1–34.0)
MCHC: 35 g/dL (ref 31.5–36.0)
MCV: 91.6 fL (ref 79.5–101.0)
MONO#: 1.3 10*3/uL — AB (ref 0.1–0.9)
MONO%: 5.6 % (ref 0.0–14.0)
NEUT#: 18.3 10*3/uL — ABNORMAL HIGH (ref 1.5–6.5)
NEUT%: 81.7 % — AB (ref 38.4–76.8)
Platelets: 81 10*3/uL — ABNORMAL LOW (ref 145–400)
RBC: 4.74 10*6/uL (ref 3.70–5.45)
RDW: 14.5 % (ref 11.2–14.5)
WBC: 22.3 10*3/uL — ABNORMAL HIGH (ref 3.9–10.3)
lymph#: 2.8 10*3/uL (ref 0.9–3.3)
nRBC: 1 % — ABNORMAL HIGH (ref 0–0)

## 2015-02-16 LAB — TECHNOLOGIST REVIEW

## 2015-02-23 ENCOUNTER — Other Ambulatory Visit (HOSPITAL_BASED_OUTPATIENT_CLINIC_OR_DEPARTMENT_OTHER): Payer: 59

## 2015-02-23 DIAGNOSIS — D693 Immune thrombocytopenic purpura: Secondary | ICD-10-CM

## 2015-02-23 DIAGNOSIS — D649 Anemia, unspecified: Secondary | ICD-10-CM

## 2015-02-23 DIAGNOSIS — D509 Iron deficiency anemia, unspecified: Secondary | ICD-10-CM

## 2015-02-23 LAB — CBC WITH DIFFERENTIAL/PLATELET
BASO%: 0.1 % (ref 0.0–2.0)
Basophils Absolute: 0 10*3/uL (ref 0.0–0.1)
EOS%: 2.6 % (ref 0.0–7.0)
Eosinophils Absolute: 0.6 10*3/uL — ABNORMAL HIGH (ref 0.0–0.5)
HCT: 41.1 % (ref 34.8–46.6)
HEMOGLOBIN: 14.2 g/dL (ref 11.6–15.9)
LYMPH%: 28.4 % (ref 14.0–49.7)
MCH: 31.9 pg (ref 25.1–34.0)
MCHC: 34.5 g/dL (ref 31.5–36.0)
MCV: 92.4 fL (ref 79.5–101.0)
MONO#: 1.7 10*3/uL — ABNORMAL HIGH (ref 0.1–0.9)
MONO%: 7.1 % (ref 0.0–14.0)
NEUT%: 61.8 % (ref 38.4–76.8)
NEUTROS ABS: 15.1 10*3/uL — AB (ref 1.5–6.5)
NRBC: 0 % (ref 0–0)
Platelets: 176 10*3/uL (ref 145–400)
RBC: 4.45 10*6/uL (ref 3.70–5.45)
RDW: 15 % — AB (ref 11.2–14.5)
WBC: 24.5 10*3/uL — ABNORMAL HIGH (ref 3.9–10.3)
lymph#: 7 10*3/uL — ABNORMAL HIGH (ref 0.9–3.3)

## 2015-02-23 LAB — TECHNOLOGIST REVIEW

## 2015-02-23 LAB — CHCC SATELLITE - SMEAR

## 2015-03-02 ENCOUNTER — Other Ambulatory Visit (HOSPITAL_BASED_OUTPATIENT_CLINIC_OR_DEPARTMENT_OTHER): Payer: 59

## 2015-03-02 DIAGNOSIS — D509 Iron deficiency anemia, unspecified: Secondary | ICD-10-CM

## 2015-03-02 DIAGNOSIS — D649 Anemia, unspecified: Secondary | ICD-10-CM

## 2015-03-02 DIAGNOSIS — D693 Immune thrombocytopenic purpura: Secondary | ICD-10-CM

## 2015-03-02 LAB — TECHNOLOGIST REVIEW

## 2015-03-02 LAB — CBC WITH DIFFERENTIAL/PLATELET
BASO%: 1.1 % (ref 0.0–2.0)
Basophils Absolute: 0.1 10*3/uL (ref 0.0–0.1)
EOS%: 7.4 % — ABNORMAL HIGH (ref 0.0–7.0)
Eosinophils Absolute: 0.9 10*3/uL — ABNORMAL HIGH (ref 0.0–0.5)
HCT: 36.7 % (ref 34.8–46.6)
HEMOGLOBIN: 12.4 g/dL (ref 11.6–15.9)
LYMPH#: 3.7 10*3/uL — AB (ref 0.9–3.3)
LYMPH%: 30.5 % (ref 14.0–49.7)
MCH: 32 pg (ref 25.1–34.0)
MCHC: 33.8 g/dL (ref 31.5–36.0)
MCV: 94.6 fL (ref 79.5–101.0)
MONO#: 0.8 10*3/uL (ref 0.1–0.9)
MONO%: 6.7 % (ref 0.0–14.0)
NEUT#: 6.6 10*3/uL — ABNORMAL HIGH (ref 1.5–6.5)
NEUT%: 54.3 % (ref 38.4–76.8)
PLATELETS: 235 10*3/uL (ref 145–400)
RBC: 3.88 10*6/uL (ref 3.70–5.45)
RDW: 15.2 % — ABNORMAL HIGH (ref 11.2–14.5)
WBC: 12.2 10*3/uL — ABNORMAL HIGH (ref 3.9–10.3)
nRBC: 0 % (ref 0–0)

## 2015-03-09 ENCOUNTER — Encounter: Payer: Self-pay | Admitting: Hematology & Oncology

## 2015-03-09 ENCOUNTER — Other Ambulatory Visit (HOSPITAL_BASED_OUTPATIENT_CLINIC_OR_DEPARTMENT_OTHER): Payer: 59

## 2015-03-09 ENCOUNTER — Ambulatory Visit (HOSPITAL_BASED_OUTPATIENT_CLINIC_OR_DEPARTMENT_OTHER): Payer: 59 | Admitting: Hematology & Oncology

## 2015-03-09 VITALS — BP 151/83 | HR 83 | Temp 98.0°F | Resp 14 | Ht 64.0 in | Wt 191.0 lb

## 2015-03-09 DIAGNOSIS — D693 Immune thrombocytopenic purpura: Secondary | ICD-10-CM

## 2015-03-09 DIAGNOSIS — D509 Iron deficiency anemia, unspecified: Secondary | ICD-10-CM

## 2015-03-09 LAB — CBC WITH DIFFERENTIAL (CANCER CENTER ONLY)
BASO#: 0.1 10*3/uL (ref 0.0–0.2)
BASO%: 0.7 % (ref 0.0–2.0)
EOS%: 5.6 % (ref 0.0–7.0)
Eosinophils Absolute: 0.5 10*3/uL (ref 0.0–0.5)
HEMATOCRIT: 41.1 % (ref 34.8–46.6)
HGB: 14.2 g/dL (ref 11.6–15.9)
LYMPH#: 2.4 10*3/uL (ref 0.9–3.3)
LYMPH%: 27.6 % (ref 14.0–48.0)
MCH: 32.6 pg (ref 26.0–34.0)
MCHC: 34.5 g/dL (ref 32.0–36.0)
MCV: 94 fL (ref 81–101)
MONO#: 0.9 10*3/uL (ref 0.1–0.9)
MONO%: 10.3 % (ref 0.0–13.0)
NEUT#: 4.8 10*3/uL (ref 1.5–6.5)
NEUT%: 55.8 % (ref 39.6–80.0)
PLATELETS: 255 10*3/uL (ref 145–400)
RBC: 4.36 10*6/uL (ref 3.70–5.32)
RDW: 14.8 % (ref 11.1–15.7)
WBC: 8.6 10*3/uL (ref 3.9–10.0)

## 2015-03-09 NOTE — Progress Notes (Signed)
Hematology and Oncology Follow Up Visit  JUSTUS DUERR [id number] 26-Dec-1960 54 y.o. 03/09/2015   Principle Diagnosis:   Chronic immune thrombocytopenia-recurrent  Current Therapy:    Observation     Interim History:  Ms.  Mink is back for followup. She is doing better. Her platelet count has gone back up. She typically gives himself a short course of Decadron. Once she does, her platelet count often responds..  She still stressed at work but this is a little bit better.  She is not as stressed at home. Her family situation is doing much better. Past she's busy getting her mother's house ready for she and her husband to move into. This is keeping Ms. Raby active.  She's had a problem with bleeding or bruising. She's had no weight loss or weight gain. There's been no leg swelling. There's been no change in bowel or bladder habits.   Overall, her performance status is ECOG 0.  Medications:  Current outpatient prescriptions:  .  Cyanocobalamin (VITAMIN B 12 PO), Take by mouth every morning., Disp: , Rfl:  .  dexamethasone (DECADRON) 4 MG tablet, Take as directed., Disp: 60 tablet, Rfl: 2 .  nebivolol (BYSTOLIC) 5 MG tablet, Take 1 tablet (5 mg total) by mouth daily., Disp: 90 tablet, Rfl: 3 .  valsartan-hydrochlorothiazide (DIOVAN-HCT) 320-25 MG per tablet, Take 1 tablet by mouth daily., Disp: 90 tablet, Rfl: 3  Allergies:  Allergies  Allergen Reactions  . Shellfish Allergy Anaphylaxis  . Influenza Vaccines     Per pt can't take mess up with her immune system  . Morphine     Past Medical History, Surgical history, Social history, and Family History were reviewed and updated.  Review of Systems: As above  Physical Exam:  height is 5\' 4"  (1.626 m) and weight is 191 lb (86.637 kg). Her oral temperature is 98 F (36.7 C). Her blood pressure is 151/83 and her pulse is 83. Her respiration is 14.   Well-developed and well-nourished white female. Head and neck exam shows no  ocular or oral lesions. There is no adenopathy of the neck. Lungs are clear. Cardiac exam regular rate and rhythm with no murmurs rubs or bruits. Abdomen has a well-healed splenectomy scar. She has no fluid wave. There is no palpable liver edge. Back exam no tenderness over the spine ribs or hips. Extremities no clubbing cyanosis or edema. Skin exam no rashes ecchymoses or petechia.  Lab Results  Component Value Date   WBC 8.6 03/09/2015   HGB 14.2 03/09/2015   HCT 41.1 03/09/2015   MCV 94 03/09/2015   PLT 255 03/09/2015     Chemistry      Component Value Date/Time   NA 138 02/18/2013 0855   NA 136 03/03/2011 0804   K 3.9 02/18/2013 0855   K 4.4 03/03/2011 0804   CL 101 02/18/2013 0855   CL 97* 03/03/2011 0804   CO2 29 02/18/2013 0855   CO2 26 03/03/2011 0804   BUN 16 02/18/2013 0855   BUN 12 03/03/2011 0804   CREATININE 1.0 02/18/2013 0855   CREATININE 0.8 03/03/2011 0804      Component Value Date/Time   CALCIUM 9.2 02/18/2013 0855   CALCIUM 9.8 03/03/2011 0804   ALKPHOS 89 02/18/2013 0855   ALKPHOS 75 03/03/2011 0804   AST 21 02/18/2013 0855   AST 15 03/03/2011 0804   ALT 29 02/18/2013 0855   ALT 23 03/03/2011 0804   BILITOT 0.7 02/18/2013 0855   BILITOT 0.40  03/03/2011 0804       Impression and Plan: Ms. Kube is a 54 year old white female. She has a history of relapsed chronic immune thrombocytopenia. We treated her with Rituxan. She responded very well. She got treated back in October of 2014.  Her platelet count is doing much better. She is also there is right now. We will just watch her.  I think we'll probably hold off on doing weekly labs right now.  I want to get her back in 4 weeks. If she knows the symptoms very well of low platelets. She will let us know if she has any symptoms and if so, then we get lab work on her.   [name], MD 5/20/20169:00 AM

## 2015-03-21 ENCOUNTER — Telehealth: Payer: Self-pay | Admitting: Family

## 2015-03-21 DIAGNOSIS — I2699 Other pulmonary embolism without acute cor pulmonale: Secondary | ICD-10-CM

## 2015-03-21 DIAGNOSIS — J069 Acute upper respiratory infection, unspecified: Secondary | ICD-10-CM

## 2015-03-21 HISTORY — DX: Other pulmonary embolism without acute cor pulmonale: I26.99

## 2015-03-21 MED ORDER — BENZONATATE 200 MG PO CAPS: 200.0000 mg | ORAL_CAPSULE | Freq: Three times a day (TID) | ORAL | Status: DC | PRN

## 2015-03-21 NOTE — Progress Notes (Signed)
We are sorry that you are not feeling well.  Here is how we plan to help!  Based on what you have shared with me it looks like you have upper respiratory tract inflammation that has resulted in a significant cough.  Inflammation and infection in the upper respiratory tract is commonly called bronchitis and has four common causes:  Allergies, Viral Infections, Acid Reflux and Bacterial Infections.  Allergies, viruses and acid reflux are treated by controlling symptoms or eliminating the cause. An example might be a cough caused by taking certain blood pressure medications. You stop the cough by changing the medication. Another example might be a cough caused by acid reflux. Controlling the reflux helps control the cough.  Based on your presentation I believe you most likely have A cough due to allergies.  I recommend that you start the an over-the counter-allergy medication such as Claritin 10 mg or Zyrtec 10 mg daily.    In addition you may use A non-prescription cough medication called Robitussin DAC. Take 2 teaspoons every 8 hours or Delsym: take 2 teaspoons every 12 hours., A non-prescription cough medication called Mucinex DM: take 2 tablets every 12 hours. and A prescription cough medication called Tessalon Perles 100mg . You may take 1-2 capsules every 8 hours as needed for your cough.    HOME CARE . Only take medications as instructed by your medical team. . Complete the entire course of an antibiotic. . Drink plenty of fluids and get plenty of rest. . Avoid close contacts especially the very young and the elderly . Cover your mouth if you cough or cough into your sleeve. . Always remember to wash your hands . A steam or ultrasonic humidifier can help congestion.    GET HELP RIGHT AWAY IF: . You develop worsening fever. . You become short of breath . You cough up blood. . Your symptoms persist after you have completed your treatment plan MAKE SURE YOU   Understand these  instructions.  Will watch your condition.  Will get help right away if you are not doing well or get worse.  Your e-visit answers were reviewed by a board certified advanced clinical practitioner to complete your personal care plan.  Depending on the condition, your plan could have included both over the counter or prescription medications.  If there is a problem please reply  once you have received a response from your provider.  Your safety is important to [place].  If you have drug allergies check your prescription carefully.    You can use MyChart to ask questions about today's visit, request a non-urgent call back, or ask for a work or school excuse.  You will get an e-mail in the next two days asking about your experience.  I hope that your e-visit has been valuable and will speed your recovery. Thank you for using e-visits.

## 2015-03-24 ENCOUNTER — Other Ambulatory Visit (HOSPITAL_BASED_OUTPATIENT_CLINIC_OR_DEPARTMENT_OTHER): Payer: Self-pay

## 2015-03-24 ENCOUNTER — Inpatient Hospital Stay (HOSPITAL_BASED_OUTPATIENT_CLINIC_OR_DEPARTMENT_OTHER)
Admission: EM | Admit: 2015-03-24 | Discharge: 2015-03-28 | DRG: 175 | Disposition: A | Discharge status: Home / Self Care | Payer: 59 | Attending: Pulmonary Disease | Admitting: Pulmonary Disease

## 2015-03-24 ENCOUNTER — Emergency Department (HOSPITAL_BASED_OUTPATIENT_CLINIC_OR_DEPARTMENT_OTHER): Payer: 59

## 2015-03-24 ENCOUNTER — Encounter (HOSPITAL_BASED_OUTPATIENT_CLINIC_OR_DEPARTMENT_OTHER): Payer: Self-pay | Admitting: *Deleted

## 2015-03-24 DIAGNOSIS — Z885 Allergy status to narcotic agent status: Secondary | ICD-10-CM

## 2015-03-24 DIAGNOSIS — I82411 Acute embolism and thrombosis of right femoral vein: Secondary | ICD-10-CM | POA: Diagnosis present

## 2015-03-24 DIAGNOSIS — E663 Overweight: Secondary | ICD-10-CM | POA: Diagnosis present

## 2015-03-24 DIAGNOSIS — I82491 Acute embolism and thrombosis of other specified deep vein of right lower extremity: Secondary | ICD-10-CM | POA: Diagnosis present

## 2015-03-24 DIAGNOSIS — F411 Generalized anxiety disorder: Secondary | ICD-10-CM | POA: Diagnosis present

## 2015-03-24 DIAGNOSIS — I10 Essential (primary) hypertension: Secondary | ICD-10-CM | POA: Diagnosis present

## 2015-03-24 DIAGNOSIS — I2692 Saddle embolus of pulmonary artery without acute cor pulmonale: Secondary | ICD-10-CM | POA: Diagnosis present

## 2015-03-24 DIAGNOSIS — E876 Hypokalemia: Secondary | ICD-10-CM | POA: Diagnosis present

## 2015-03-24 DIAGNOSIS — D72829 Elevated white blood cell count, unspecified: Secondary | ICD-10-CM

## 2015-03-24 DIAGNOSIS — Z86718 Personal history of other venous thrombosis and embolism: Secondary | ICD-10-CM | POA: Diagnosis not present

## 2015-03-24 DIAGNOSIS — I82431 Acute embolism and thrombosis of right popliteal vein: Secondary | ICD-10-CM | POA: Diagnosis present

## 2015-03-24 DIAGNOSIS — D473 Essential (hemorrhagic) thrombocythemia: Secondary | ICD-10-CM | POA: Diagnosis not present

## 2015-03-24 DIAGNOSIS — R739 Hyperglycemia, unspecified: Secondary | ICD-10-CM | POA: Diagnosis present

## 2015-03-24 DIAGNOSIS — J9601 Acute respiratory failure with hypoxia: Secondary | ICD-10-CM | POA: Diagnosis present

## 2015-03-24 DIAGNOSIS — Z91013 Allergy to seafood: Secondary | ICD-10-CM

## 2015-03-24 DIAGNOSIS — D509 Iron deficiency anemia, unspecified: Secondary | ICD-10-CM | POA: Diagnosis present

## 2015-03-24 DIAGNOSIS — Z9049 Acquired absence of other specified parts of digestive tract: Secondary | ICD-10-CM | POA: Diagnosis present

## 2015-03-24 DIAGNOSIS — Z6832 Body mass index (BMI) 32.0-32.9, adult: Secondary | ICD-10-CM | POA: Diagnosis not present

## 2015-03-24 DIAGNOSIS — R0602 Shortness of breath: Secondary | ICD-10-CM

## 2015-03-24 DIAGNOSIS — J329 Chronic sinusitis, unspecified: Secondary | ICD-10-CM | POA: Diagnosis present

## 2015-03-24 DIAGNOSIS — Z9081 Acquired absence of spleen: Secondary | ICD-10-CM | POA: Diagnosis present

## 2015-03-24 DIAGNOSIS — I2699 Other pulmonary embolism without acute cor pulmonale: Secondary | ICD-10-CM | POA: Diagnosis not present

## 2015-03-24 DIAGNOSIS — Z8249 Family history of ischemic heart disease and other diseases of the circulatory system: Secondary | ICD-10-CM

## 2015-03-24 DIAGNOSIS — D696 Thrombocytopenia, unspecified: Secondary | ICD-10-CM | POA: Diagnosis not present

## 2015-03-24 DIAGNOSIS — Z887 Allergy status to serum and vaccine status: Secondary | ICD-10-CM | POA: Diagnosis not present

## 2015-03-24 DIAGNOSIS — Z79899 Other long term (current) drug therapy: Secondary | ICD-10-CM | POA: Diagnosis not present

## 2015-03-24 DIAGNOSIS — D693 Immune thrombocytopenic purpura: Secondary | ICD-10-CM

## 2015-03-24 DIAGNOSIS — Z862 Personal history of diseases of the blood and blood-forming organs and certain disorders involving the immune mechanism: Secondary | ICD-10-CM

## 2015-03-24 LAB — CBC WITH DIFFERENTIAL/PLATELET
BASOS PCT: 0 % (ref 0–1)
Band Neutrophils: 1 % (ref 0–10)
Basophils Absolute: 0 10*3/uL (ref 0.0–0.1)
EOS PCT: 2 % (ref 0–5)
Eosinophils Absolute: 0.4 10*3/uL (ref 0.0–0.7)
HCT: 36.4 % (ref 36.0–46.0)
Hemoglobin: 12.2 g/dL (ref 12.0–15.0)
Lymphocytes Relative: 23 % (ref 12–46)
Lymphs Abs: 4.7 10*3/uL — ABNORMAL HIGH (ref 0.7–4.0)
MCH: 31.2 pg (ref 26.0–34.0)
MCHC: 33.5 g/dL (ref 30.0–36.0)
MCV: 93.1 fL (ref 78.0–100.0)
Monocytes Absolute: 0.8 10*3/uL (ref 0.1–1.0)
Monocytes Relative: 4 % (ref 3–12)
Neutro Abs: 14.6 10*3/uL — ABNORMAL HIGH (ref 1.7–7.7)
Neutrophils Relative %: 70 % (ref 43–77)
Platelets: 21 10*3/uL — CL (ref 150–400)
RBC: 3.91 MIL/uL (ref 3.87–5.11)
RDW: 13.8 % (ref 11.5–15.5)
WBC: 20.5 10*3/uL — AB (ref 4.0–10.5)

## 2015-03-24 LAB — TROPONIN I: Troponin I: 0.16 ng/mL — ABNORMAL HIGH (ref <0.031)

## 2015-03-24 LAB — COMPREHENSIVE METABOLIC PANEL
ALT: 35 U/L (ref 14–54)
ANION GAP: 8 (ref 5–15)
AST: 19 U/L (ref 15–41)
Albumin: 3.5 g/dL (ref 3.5–5.0)
Alkaline Phosphatase: 83 U/L (ref 38–126)
BILIRUBIN TOTAL: 0.2 mg/dL — AB (ref 0.3–1.2)
BUN: 7 mg/dL (ref 6–20)
CALCIUM: 8.5 mg/dL — AB (ref 8.9–10.3)
CO2: 28 mmol/L (ref 22–32)
Chloride: 103 mmol/L (ref 101–111)
Creatinine, Ser: 1.05 mg/dL — ABNORMAL HIGH (ref 0.44–1.00)
GFR calc non Af Amer: 60 mL/min — ABNORMAL LOW (ref >60)
GLUCOSE: 104 mg/dL — AB (ref 65–99)
Potassium: 3.1 mmol/L — ABNORMAL LOW (ref 3.5–5.1)
Sodium: 139 mmol/L (ref 135–145)
Total Protein: 6.8 g/dL (ref 6.5–8.1)

## 2015-03-24 LAB — GLUCOSE, CAPILLARY: Glucose-Capillary: 112 mg/dL — ABNORMAL HIGH (ref 65–99)

## 2015-03-24 LAB — BRAIN NATRIURETIC PEPTIDE: B NATRIURETIC PEPTIDE 5: 182.5 pg/mL — AB (ref 0.0–100.0)

## 2015-03-24 LAB — MRSA PCR SCREENING: MRSA BY PCR: NEGATIVE

## 2015-03-24 LAB — D-DIMER, QUANTITATIVE (NOT AT ARMC): D DIMER QUANT: 5.98 ug{FEU}/mL — AB (ref 0.00–0.48)

## 2015-03-24 MED ORDER — SODIUM CHLORIDE 0.9 % IV SOLN: 250.0000 mL | INTRAVENOUS | Status: DC | PRN

## 2015-03-24 MED ORDER — IOHEXOL 350 MG/ML SOLN
100.0000 mL | Freq: Once | INTRAVENOUS | Status: AC | PRN
  Administered 2015-03-24: 100 mL via INTRAVENOUS

## 2015-03-24 MED ORDER — METOPROLOL TARTRATE 1 MG/ML IV SOLN
5.0000 mg | Freq: Four times a day (QID) | INTRAVENOUS | Status: DC | PRN
  Administered 2015-03-24: 5 mg via INTRAVENOUS
  Filled 2015-03-24: qty 5

## 2015-03-24 MED ORDER — SODIUM CHLORIDE 0.9 % IV SOLN
Freq: Once | INTRAVENOUS | Status: AC
  Administered 2015-03-26: 09:00:00 via INTRAVENOUS

## 2015-03-24 NOTE — ED Notes (Signed)
Critical platelet value reported to Dr. Stark Jock

## 2015-03-24 NOTE — ED Notes (Signed)
[facility] here for transport, report given to Merry Proud, RN and Tommi Rumps, EMT-P.

## 2015-03-24 NOTE — Progress Notes (Signed)
eLink Physician-Brief Progress Note Patient Name: Janice Martin DOB: [birth year] MRN: [id number]   Date of Service  03/24/2015  HPI/Events of Note  17 F with hx of ITP, previously on Rituxian and Steroids, now with saddle PE, plts ~21.  Patient stated that she had a cold\URI earlier in the week, was treated with antibiotics but continue to have inc sob, prompting visit to [facility].  Noted to have elevated D-dimer and Saddle embolus on CTA Chest  eICU Interventions  Saddle PE - unclear etiology - difficult to treat given low plts - plan to transfuse platelets and consider starting heparin once plt count >50K - may give another dose of steroids - consult heme\onc     Intervention Category Evaluation Type: New Patient Evaluation  Julus Kelley 03/24/2015, 10:41 PM

## 2015-03-24 NOTE — ED Notes (Addendum)
Pt states that she has had URI since 5/23 and its now resolving ("breaking up").  Pt just feels tired and wants to be checked.  No fever, chills. 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

## 2015-03-24 NOTE — H&P (Signed)
PULMONARY / CRITICAL CARE MEDICINE HISTORY AND PHYSICAL EXAMINATION   Name: Janice Martin MRN: [id number] DOB: 14-Aug-1961    ADMISSION DATE:  03/24/2015  PRIMARY SERVICE: PCCM  CHIEF COMPLAINT:  PE  BRIEF PATIENT DESCRIPTION: 26 F with ITP presenting after found to have submassive PE at OSH. Transferred to [facility] for ongoing management.  SIGNIFICANT EVENTS / STUDIES:  PLT count 21 CT-PE - Submassive PE  LINES / TUBES: PIV  CULTURES: None  ANTIBIOTICS: None  HISTORY OF PRESENT ILLNESS:  Janice Martin is a 39 F with ITP who presented to [facility] for persistent SOB following a recent URI and was found to have a PE. She was transferred to [facility] for further evaluation. She has no SOB at rest and is currently comfortable.   PAST MEDICAL HISTORY :  Past Medical History  Diagnosis Date  . ANEMIA-IRON DEFICIENCY   . Immune thrombocytopenic purpura 2003 dx    s/p rituxan 06/2012 - now remission  . LEUKOCYTOSIS UNSPECIFIED   . Anxiety state, unspecified   . HYPERTENSION    Past Surgical History  Procedure Laterality Date  . Cholecystectomy    . Spleenectomy    . Cesarean section      x's 2   Prior to Admission medications   Medication Sig Start Date End Date Taking? Authorizing Provider  benzonatate (TESSALON) 200 MG capsule Take 1 capsule (200 mg total) by mouth 3 (three) times daily as needed. 03/21/15   Sharion Balloon, FNP  Cyanocobalamin (VITAMIN B 12 PO) Take by mouth every morning.    Historical Provider, MD  dexamethasone (DECADRON) 4 MG tablet Take as directed. 11/15/14   [name], MD  nebivolol (BYSTOLIC) 5 MG tablet Take 1 tablet (5 mg total) by mouth daily. 06/27/14   Rowe Clack, MD  valsartan-hydrochlorothiazide (DIOVAN-HCT) 320-25 MG per tablet Take 1 tablet by mouth daily. 06/27/14   Rowe Clack, MD   Allergies  Allergen Reactions  . Shellfish Allergy Anaphylaxis  . Influenza Vaccines     Per pt can't take mess up with her immune system  .  Morphine     FAMILY HISTORY:  Family History  Problem Relation Age of Onset  . Arthritis Mother   . Arthritis Father   . Mental illness Father   . Hypertension Other     Parent  . Heart disease Other     Grandparent  . Stroke Other     grandparent   SOCIAL HISTORY:  reports that she has never smoked. She has never used smokeless tobacco. She reports that she does not drink alcohol or use illicit drugs.  REVIEW OF SYSTEMS:  A 12-system ROS was conducted and, unless otherwise specified in the HPI, was negative.    SUBJECTIVE:   VITAL SIGNS: Temp:  [98.8 F (37.1 C)-98.9 F (37.2 C)] 98.8 F (37.1 C) (06/04 2058) Pulse Rate:  [110-127] 110 (06/04 2230) Resp:  [17-30] 30 (06/04 2230) BP: (133-171)/(84-113) 159/93 mmHg (06/04 2230) SpO2:  [96 %-100 %] 98 % (06/04 2230) Weight:  [193 lb 12.6 oz (87.9 kg)-194 lb (87.998 kg)] 193 lb 12.6 oz (87.9 kg) (06/04 2130) HEMODYNAMICS:   VENTILATOR SETTINGS:   INTAKE / OUTPUT: Intake/Output    None     PHYSICAL EXAMINATION: General:  NAD Neuro:  Intact HEENT:  Sclera anicteric, conjunctiva pink, MMM, OP clear Neck: Trachea supple and midline, (-) LAN or JVD Cardiovascular:  RRR, nS1/S2, (-) MRG Lungs:  CTAB Abdomen:  S/NT/ND/(+)BS Musculoskeletal:  (-)  C/C/E Skin:  Intact  LABS:  CBC  Recent Labs Lab 03/24/15 1614  WBC 20.5*  HGB 12.2  HCT 36.4  PLT 21*   Coag's No results for input(s): APTT, INR in the last 168 hours. BMET  Recent Labs Lab 03/24/15 1614  NA 139  K 3.1*  CL 103  CO2 28  BUN 7  CREATININE 1.05*  GLUCOSE 104*   Electrolytes  Recent Labs Lab 03/24/15 1614  CALCIUM 8.5*   Sepsis Markers No results for input(s): LATICACIDVEN, PROCALCITON, O2SATVEN in the last 168 hours. ABG No results for input(s): PHART, PCO2ART, PO2ART in the last 168 hours. Liver Enzymes  Recent Labs Lab 03/24/15 1614  AST 19  ALT 35  ALKPHOS 83  BILITOT 0.2*  ALBUMIN 3.5   Cardiac Enzymes  Recent  Labs Lab 03/24/15 1614  TROPONINI 0.16*   Glucose  Recent Labs Lab 03/24/15 2052  GLUCAP 112*    Imaging Dg Chest 2 View  03/24/2015   CLINICAL DATA:  Upper respiratory infection.  Cough and congestion.  EXAM: CHEST - 2 VIEW  COMPARISON:  None.  FINDINGS: Heart size is normal. The lung volumes are somewhat low. No focal airspace disease is present. Surgical clips are present at the gallbladder fossa and near the left hemidiaphragm.  IMPRESSION: No acute cardiopulmonary disease.   Electronically Signed   By: San Morelle M.D.   On: 03/24/2015 15:24   Ct Angio Chest Pe W/cm &/or Wo Cm  03/24/2015   CLINICAL DATA:  Acute onset of cough, congestion and productive cough. Shortness of breath. Elevated D-dimer. Initial encounter.  EXAM: CT ANGIOGRAPHY CHEST WITH CONTRAST  TECHNIQUE: Multidetector CT imaging of the chest was performed using the standard protocol during bolus administration of intravenous contrast. Multiplanar CT image reconstructions and MIPs were obtained to evaluate the vascular anatomy.  CONTRAST:  163mL OMNIPAQUE IOHEXOL 350 MG/ML SOLN  COMPARISON:  Chest radiograph performed earlier today at 2:51 p.m.  FINDINGS: A saddle pulmonary embolus is noted, with pulmonary embolus extending into all lobes of both lungs. Large clot burden is seen. The RV/LV ratio of 1.64 is consistent with right heart strain and at least submassive pulmonary embolus.  Mild left basilar airspace opacity may reflect a small pulmonary infarct. There is minimal nonspecific ground-glass opacity at the left lung apex, measuring 0.7 cm in size. There is no evidence of pleural effusion or pneumothorax. No abnormal focal contrast enhancement is seen.  Visualized mediastinal nodes are normal in size. No mediastinal lymphadenopathy is seen. No pericardial effusion is identified. The great vessels are grossly unremarkable in appearance. No axillary lymphadenopathy is seen. The thyroid gland is unremarkable in  appearance.  The visualized portions of the liver are unremarkable. The patient is status post splenectomy. The patient is status post cholecystectomy, with clips noted at the gallbladder fossa. The visualized portions of the pancreas, adrenal glands and left kidney are within normal limits.  No acute osseous abnormalities are seen.  Review of the MIP images confirms the above findings.  IMPRESSION: 1. Large saddle pulmonary embolus noted, with pulmonary embolus extending into all lobes of both lungs. CT evidence of right heart strain (RV/LV Ratio = 1.64) consistent with at least submassive (intermediate risk) PE. The presence of right heart strain has been associated with an increased risk of morbidity and mortality. Please activate Code PE by paging [phone]. 2. Mild left basilar airspace opacity may reflect a small pulmonary infarct. 3. Minimal nonspecific ground-glass opacity at the left lung apex,  measuring 0.7 cm in size. Would perform initial follow-up CT of the chest at 3 months, to ensure persistence, as per Fleischner Society recommendations.  Critical Value/emergent results were called by telephone at the time of interpretation on 03/24/2015 at 6:12 pm to Dr. Veryl Speak, who verbally acknowledged these results.   Electronically Signed   By: Garald Balding M.D.   On: 03/24/2015 18:22    EKG: Sinus Tach CXR: Reviewed.  ASSESSMENT / PLAN:  Active Problems:   Saddle embolism of pulmonary artery   Saddle embolus of pulmonary artery   PULMONARY A: Acute Hypoxic RF: Minimal O2 requirement. LUL GGO: P:   Supplemental O2 to maintain sats >= 92% Follow-up CT in 3 months to confirm persistence of GGO, then yearly x 3 years  CARDIOVASCULAR A: HTN:  P:   Cont OP ARB/HCTZ Hold BBs for now  RENAL A: Hypokalemia:  P:   Repelte  GASTROINTESTINAL A: No acute issues  HEMATOLOGIC A: Submassive PE: I discussed Ms. Baetz's case at length with the on-call hematologist, Dr. Alvy Bimler.  She recommends that we hold off anticoagulation given the low PLT counts and not immediately move to place an IVC filer. She also does not recommend transfusing PLTs or giving steroids for now. Elevated WBC Count: ? Reactive Anemia:  P:   Monitor Heme/Onc to formally see in AM  INFECTIOUS A: No acute issues  ENDOCRINE A: Elevated blood glucose:  P:   Check A1c SSI  NEUROLOGIC A: Anxiety:  P:   Monitor  BEST PRACTICE / DISPOSITION Level of Care:  ICU Primary Service:  PCCM Consultants:  Heme/Onc Code Status:  Full Diet:  Regular DVT Px:  As above GI Px:  Not indicated Skin Integrity:  Intact Social / Family:  Updated at bedside  TODAY'S SUMMARY:   I have personally obtained a history, examined the patient, evaluated laboratory and imaging results, formulated the assessment and plan and placed orders.  CRITICAL CARE: The patient is critically ill with multiple organ systems failure and requires high complexity decision making for assessment and support, frequent evaluation and titration of therapies, application of advanced monitoring technologies and extensive interpretation of multiple databases. Critical Care Time devoted to patient care services described in this note is 30 minutes.   Margarette Asal, MD Pulmonary and [place] Pager: [phone]   03/24/2015, 11:28 PM

## 2015-03-24 NOTE — ED Notes (Signed)
Pt alert, NAD, calm, interactive, resps e/u, tachypneic, (Denies: pain, sob, nausea, dizziness), mentions only recent cough cold sx, (clear, productive). Family at [facility]. pending transport.

## 2015-03-24 NOTE — ED Provider Notes (Signed)
CSN: [id number]     Arrival date & time 03/24/15  1427 History  This chart was scribed for Veryl Speak, MD by Meriel Pica, ED Scribe. This patient was seen in room MH02/MH02 and the patient's care was started 3:53 PM.  Chief Complaint  Patient presents with  . URI   Patient is a 54 y.o. female presenting with URI. The history is provided by the patient. No language interpreter was used.  URI Presenting symptoms: congestion, cough and fatigue   Presenting symptoms: no fever   Severity:  Moderate Onset quality:  Sudden Duration:  3 weeks Timing:  Constant Progression:  Partially resolved Chronicity:  New Relieved by:  Prescription medications (antibiotic course ) Worsened by:  Nothing tried  HPI Comments: Janice Martin is a 54 y.o. female, with a PMhx of HTN, iron deficiency anemia, and anxiety, who presents to the Emergency Department complaining of a constant, moderate productive cough with clear/white phlegm onset 12 days ago. Pt reports associated night sweats, weakness, and fatigue. She notes she has been taking antibiotics which she has been compliant with and her symptoms have progressively improved. She is currently prescribed Valsartan and Bystolicbut did not take her medication this morning. Her BP is currently 162/104 mmHg. Pt denies fever, BLE swelling, or chills. She denies cardiac pathologies or smoking tobacco absue.   Past Medical History  Diagnosis Date  . ANEMIA-IRON DEFICIENCY   . Immune thrombocytopenic purpura 2003 dx    s/p rituxan 06/2012 - now remission  . LEUKOCYTOSIS UNSPECIFIED   . Anxiety state, unspecified   . HYPERTENSION    Past Surgical History  Procedure Laterality Date  . Cholecystectomy    . Spleenectomy    . Cesarean section      x's 2   Family History  Problem Relation Age of Onset  . Arthritis Mother   . Arthritis Father   . Mental illness Father   . Hypertension Other     Parent  . Heart disease Other     Grandparent  . Stroke  Other     grandparent   History  Substance Use Topics  . Smoking status: Never Smoker   . Smokeless tobacco: Never Used     Comment: never used product  . Alcohol Use: No   OB History    No data available     Review of Systems  Constitutional: Positive for diaphoresis and fatigue. Negative for fever and chills.  HENT: Positive for congestion.   Respiratory: Positive for cough.   Cardiovascular: Negative for leg swelling.  Neurological: Positive for weakness.   A complete 10 system review of systems was obtained and is otherwise negative except at noted in the HPI and PMH.  Allergies  Shellfish allergy; Influenza vaccines; and Morphine  Home Medications   Prior to Admission medications   Medication Sig Start Date End Date Taking? Authorizing Provider  benzonatate (TESSALON) 200 MG capsule Take 1 capsule (200 mg total) by mouth 3 (three) times daily as needed. 03/21/15   Sharion Balloon, FNP  Cyanocobalamin (VITAMIN B 12 PO) Take by mouth every morning.    Historical Provider, MD  dexamethasone (DECADRON) 4 MG tablet Take as directed. 11/15/14   [name], MD  nebivolol (BYSTOLIC) 5 MG tablet Take 1 tablet (5 mg total) by mouth daily. 06/27/14   Rowe Clack, MD  valsartan-hydrochlorothiazide (DIOVAN-HCT) 320-25 MG per tablet Take 1 tablet by mouth daily. 06/27/14   Rowe Clack, MD   BP  156/94 mmHg  Pulse 124  Temp(Src) 98.9 F (37.2 C) (Oral)  Resp 22  Ht 5\' 4"  (1.626 m)  Wt 194 lb (87.998 kg)  BMI 33.28 kg/m2  SpO2 97%  LMP 02/22/2015  Physical Exam  Constitutional: She appears well-developed and well-nourished.  HENT:  Head: Normocephalic and atraumatic.  Eyes: Conjunctivae are normal. Right eye exhibits no discharge. Left eye exhibits no discharge.  Cardiovascular: Exam reveals friction rub. Exam reveals no gallop.   No murmur heard. Irregular and rapid HR.  Pulmonary/Chest: Effort normal and breath sounds normal. No respiratory distress.   Neurological: She is alert. Coordination normal.  Skin: Skin is warm and dry. No rash noted. She is not diaphoretic. No erythema.  Psychiatric: She has a normal mood and affect.  Nursing note and vitals reviewed.  ED Course  Procedures  DIAGNOSTIC STUDIES: Oxygen Saturation is 97% on RA, normal by my interpretation.    COORDINATION OF CARE: 3:56 PM Discussed treatment plan which includes to get diagnostic labs and EKG. Also discussed unremarkable CXR with patient. Pt acknowledges and agrees to plan.   Labs Review Labs Reviewed  CBC WITH DIFFERENTIAL/PLATELET - Abnormal; Notable for the following:    WBC 20.5 (*)    Platelets 21 (*)    Neutro Abs 14.6 (*)    Lymphs Abs 4.7 (*)    All other components within normal limits  COMPREHENSIVE METABOLIC PANEL - Abnormal; Notable for the following:    Potassium 3.1 (*)    Glucose, Bld 104 (*)    Creatinine, Ser 1.05 (*)    Calcium 8.5 (*)    Total Bilirubin 0.2 (*)    GFR calc non Af Amer 60 (*)    All other components within normal limits  TROPONIN I - Abnormal; Notable for the following:    Troponin I 0.16 (*)    All other components within normal limits  BRAIN NATRIURETIC PEPTIDE - Abnormal; Notable for the following:    B Natriuretic Peptide 182.5 (*)    All other components within normal limits  D-DIMER, QUANTITATIVE (NOT AT [facility]) - Abnormal; Notable for the following:    D-Dimer, Quant 5.98 (*)    All other components within normal limits    Imaging Review Dg Chest 2 View  03/24/2015   CLINICAL DATA:  Upper respiratory infection.  Cough and congestion.  EXAM: CHEST - 2 VIEW  COMPARISON:  None.  FINDINGS: Heart size is normal. The lung volumes are somewhat low. No focal airspace disease is present. Surgical clips are present at the gallbladder fossa and near the left hemidiaphragm.  IMPRESSION: No acute cardiopulmonary disease.   Electronically Signed   By: San Morelle M.D.   On: 03/24/2015 15:24     EKG  Interpretation   Date/Time:  Saturday March 24 2015 16:09:59 EDT Ventricular Rate:  118 PR Interval:  142 QRS Duration: 82 QT Interval:  336 QTC Calculation: 470 R Axis:   87 Text Interpretation:  Sinus tachycardia Otherwise normal ECG Confirmed by  Mackenize Delgadillo  MD, Kajah Santizo ([id number]) on 03/24/2015 7:47:32 PM      MDM   Final diagnoses:  Shortness of breath    Patient is a 54 year old female with history of ITP. She presents today with complaints of chest congestion and cough that is worsened over the past 10 days. She has been on 2 anti-biotics however is not improving. When she was examined this evening, she was found to be tachycardic with no hypoxia. Workup was initiated with several  abnormalities. Her troponin was elevated at 0.16 and d-dimer was markedly elevated. She was also found to have a platelet count of 21.  A CT scan of the chest was obtained which revealed a large saddle pulmonary embolism with evidence for right heart strain. These findings were discussed with Dr. Stevenson Clinch from critical care. He has consult at the on-call hematologist who feels as though the patient will require platelets prior to anticoagulation. As there is no blood Bank admits that are [place], this will be deferred until the patient arrives at [facility]. She will be transferred there for anticoagulation and further workup of this pulmonary embolism.  CRITICAL CARE Performed by: Veryl Speak Total critical care time: 45 minutes Critical care time was exclusive of separately billable procedures and treating other patients. Critical care was necessary to treat or prevent imminent or life-threatening deterioration. Critical care was time spent personally by me on the following activities: development of treatment plan with patient and/or surrogate as well as nursing, discussions with consultants, evaluation of patient's response to treatment, examination of patient, obtaining history from patient or surrogate,  ordering and performing treatments and interventions, ordering and review of laboratory studies, ordering and review of radiographic studies, pulse oximetry and re-evaluation of patient's condition.   I personally performed the services described in this documentation, which was scribed in my presence. The recorded information has been reviewed and is accurate.      Veryl Speak, MD 03/24/15 [phone]

## 2015-03-25 ENCOUNTER — Inpatient Hospital Stay (HOSPITAL_COMMUNITY): Payer: 59

## 2015-03-25 ENCOUNTER — Other Ambulatory Visit: Payer: Self-pay | Admitting: Hematology and Oncology

## 2015-03-25 DIAGNOSIS — I2692 Saddle embolus of pulmonary artery without acute cor pulmonale: Principal | ICD-10-CM

## 2015-03-25 DIAGNOSIS — I2699 Other pulmonary embolism without acute cor pulmonale: Secondary | ICD-10-CM

## 2015-03-25 DIAGNOSIS — E876 Hypokalemia: Secondary | ICD-10-CM

## 2015-03-25 DIAGNOSIS — D693 Immune thrombocytopenic purpura: Secondary | ICD-10-CM

## 2015-03-25 DIAGNOSIS — D696 Thrombocytopenia, unspecified: Secondary | ICD-10-CM

## 2015-03-25 LAB — COMPREHENSIVE METABOLIC PANEL
ALBUMIN: 3.1 g/dL — AB (ref 3.5–5.0)
ALK PHOS: 80 U/L (ref 38–126)
ALT: 33 U/L (ref 14–54)
AST: 21 U/L (ref 15–41)
Anion gap: 11 (ref 5–15)
CALCIUM: 8.6 mg/dL — AB (ref 8.9–10.3)
CHLORIDE: 102 mmol/L (ref 101–111)
CO2: 25 mmol/L (ref 22–32)
CREATININE: 0.94 mg/dL (ref 0.44–1.00)
GFR calc Af Amer: >60 mL/min (ref >60)
Glucose, Bld: 136 mg/dL — ABNORMAL HIGH (ref 65–99)
Potassium: 3.1 mmol/L — ABNORMAL LOW (ref 3.5–5.1)
Sodium: 138 mmol/L (ref 135–145)
TOTAL PROTEIN: 6.4 g/dL — AB (ref 6.5–8.1)
Total Bilirubin: 0.5 mg/dL (ref 0.3–1.2)

## 2015-03-25 LAB — URINE MICROSCOPIC-ADD ON

## 2015-03-25 LAB — BASIC METABOLIC PANEL
Anion gap: 9 (ref 5–15)
BUN: 6 mg/dL (ref 6–20)
CALCIUM: 8.6 mg/dL — AB (ref 8.9–10.3)
CO2: 26 mmol/L (ref 22–32)
CREATININE: 0.87 mg/dL (ref 0.44–1.00)
Chloride: 104 mmol/L (ref 101–111)
GFR calc Af Amer: >60 mL/min (ref >60)
GFR calc non Af Amer: >60 mL/min (ref >60)
Glucose, Bld: 117 mg/dL — ABNORMAL HIGH (ref 65–99)
Potassium: 3.4 mmol/L — ABNORMAL LOW (ref 3.5–5.1)
Sodium: 139 mmol/L (ref 135–145)

## 2015-03-25 LAB — CBC
HCT: 34.5 % — ABNORMAL LOW (ref 36.0–46.0)
HCT: 36.2 % (ref 36.0–46.0)
HEMATOCRIT: 33.5 % — AB (ref 36.0–46.0)
HEMOGLOBIN: 11.5 g/dL — AB (ref 12.0–15.0)
HEMOGLOBIN: 12.4 g/dL (ref 12.0–15.0)
Hemoglobin: 11.9 g/dL — ABNORMAL LOW (ref 12.0–15.0)
MCH: 31.2 pg (ref 26.0–34.0)
MCH: 30.8 pg (ref 26.0–34.0)
MCH: 31.3 pg (ref 26.0–34.0)
MCHC: 34.3 g/dL (ref 30.0–36.0)
MCHC: 34.3 g/dL (ref 30.0–36.0)
MCHC: 34.5 g/dL (ref 30.0–36.0)
MCV: 90.6 fL (ref 78.0–100.0)
MCV: 91 fL (ref 78.0–100.0)
MCV: 89.8 fL (ref 78.0–100.0)
Platelets: 29 10*3/uL — CL (ref 150–400)
Platelets: 31 10*3/uL — ABNORMAL LOW (ref 150–400)
Platelets: 29 10*3/uL — CL (ref 150–400)
RBC: 3.81 MIL/uL — ABNORMAL LOW (ref 3.87–5.11)
RBC: 4.03 MIL/uL (ref 3.87–5.11)
RBC: 3.68 MIL/uL — AB (ref 3.87–5.11)
RDW: 14 % (ref 11.5–15.5)
RDW: 14.1 % (ref 11.5–15.5)
RDW: 13.9 % (ref 11.5–15.5)
WBC: 20.7 10*3/uL — ABNORMAL HIGH (ref 4.0–10.5)
WBC: 26.3 10*3/uL — AB (ref 4.0–10.5)
WBC: 20.6 10*3/uL — AB (ref 4.0–10.5)

## 2015-03-25 LAB — URINALYSIS, ROUTINE W REFLEX MICROSCOPIC
BILIRUBIN URINE: NEGATIVE
Bilirubin Urine: NEGATIVE
GLUCOSE, UA: NEGATIVE mg/dL
Glucose, UA: NEGATIVE mg/dL
HGB URINE DIPSTICK: NEGATIVE
Hgb urine dipstick: NEGATIVE
KETONES UR: NEGATIVE mg/dL
Ketones, ur: 15 mg/dL — AB
Leukocytes, UA: NEGATIVE
Leukocytes, UA: NEGATIVE
NITRITE: NEGATIVE
Nitrite: NEGATIVE
PROTEIN: 30 mg/dL — AB
Protein, ur: 30 mg/dL — AB
Specific Gravity, Urine: 1.01 (ref 1.005–1.030)
Urobilinogen, UA: 0.2 mg/dL (ref 0.0–1.0)
Urobilinogen, UA: 0.2 mg/dL (ref 0.0–1.0)
pH: 6.5 (ref 5.0–8.0)
pH: 6 (ref 5.0–8.0)

## 2015-03-25 LAB — GLUCOSE, CAPILLARY
GLUCOSE-CAPILLARY: 126 mg/dL — AB (ref 65–99)
GLUCOSE-CAPILLARY: 148 mg/dL — AB (ref 65–99)
Glucose-Capillary: 135 mg/dL — ABNORMAL HIGH (ref 65–99)
Glucose-Capillary: 143 mg/dL — ABNORMAL HIGH (ref 65–99)
Glucose-Capillary: 145 mg/dL — ABNORMAL HIGH (ref 65–99)
Glucose-Capillary: 187 mg/dL — ABNORMAL HIGH (ref 65–99)

## 2015-03-25 LAB — LACTIC ACID, PLASMA: Lactic Acid, Venous: 1.2 mmol/L (ref 0.5–2.0)

## 2015-03-25 LAB — TYPE AND SCREEN
ABO/RH(D): O POS
Antibody Screen: NEGATIVE

## 2015-03-25 LAB — HEPARIN LEVEL (UNFRACTIONATED): HEPARIN UNFRACTIONATED: 0.24 [IU]/mL — AB (ref 0.30–0.70)

## 2015-03-25 LAB — PROCALCITONIN: Procalcitonin: 0.11 ng/mL

## 2015-03-25 LAB — TROPONIN I: Troponin I: 0.07 ng/mL — ABNORMAL HIGH (ref <0.031)

## 2015-03-25 LAB — MAGNESIUM: Magnesium: 1.7 mg/dL (ref 1.7–2.4)

## 2015-03-25 LAB — ABO/RH: ABO/RH(D): O POS

## 2015-03-25 MED ORDER — SODIUM CHLORIDE 0.9 % IV SOLN
Freq: Once | INTRAVENOUS | Status: AC
  Administered 2015-03-25: 23:00:00 via INTRAVENOUS

## 2015-03-25 MED ORDER — MAGNESIUM SULFATE 2 GM/50ML IV SOLN
2.0000 g | Freq: Once | INTRAVENOUS | Status: AC
  Administered 2015-03-25: 2 g via INTRAVENOUS
  Filled 2015-03-25: qty 50

## 2015-03-25 MED ORDER — HEPARIN (PORCINE) IN NACL 100-0.45 UNIT/ML-% IJ SOLN
1400.0000 [IU]/h | INTRAMUSCULAR | Status: DC
  Administered 2015-03-25: 1250 [IU]/h via INTRAVENOUS
  Filled 2015-03-25 (×2): qty 250

## 2015-03-25 MED ORDER — METHYLPREDNISOLONE SODIUM SUCC 125 MG IJ SOLR: 60.0000 mg | Freq: Four times a day (QID) | INTRAMUSCULAR | Status: DC

## 2015-03-25 MED ORDER — HYDROCHLOROTHIAZIDE 25 MG PO TABS
25.0000 mg | ORAL_TABLET | Freq: Every day | ORAL | Status: DC
  Administered 2015-03-25 – 2015-03-28 (×4): 25 mg via ORAL
  Filled 2015-03-25 (×4): qty 1

## 2015-03-25 MED ORDER — METOPROLOL TARTRATE 1 MG/ML IV SOLN
2.5000 mg | Freq: Four times a day (QID) | INTRAVENOUS | Status: DC | PRN
  Administered 2015-03-25 – 2015-03-28 (×2): 2.5 mg via INTRAVENOUS
  Filled 2015-03-25 (×3): qty 5

## 2015-03-25 MED ORDER — METHYLPREDNISOLONE SODIUM SUCC 40 MG IJ SOLR
40.0000 mg | Freq: Four times a day (QID) | INTRAMUSCULAR | Status: DC
  Administered 2015-03-25: 40 mg via INTRAVENOUS
  Filled 2015-03-25 (×5): qty 1

## 2015-03-25 MED ORDER — POTASSIUM CHLORIDE 20 MEQ PO PACK
40.0000 meq | PACK | Freq: Once | ORAL | Status: DC
  Filled 2015-03-25: qty 2

## 2015-03-25 MED ORDER — LEVOFLOXACIN 500 MG PO TABS
500.0000 mg | ORAL_TABLET | Freq: Every day | ORAL | Status: DC
  Administered 2015-03-25 – 2015-03-28 (×4): 500 mg via ORAL
  Filled 2015-03-25 (×4): qty 1

## 2015-03-25 MED ORDER — VALSARTAN-HYDROCHLOROTHIAZIDE 320-25 MG PO TABS: 1.0000 | ORAL_TABLET | Freq: Every day | ORAL | Status: DC

## 2015-03-25 MED ORDER — INSULIN ASPART 100 UNIT/ML ~~LOC~~ SOLN
1.0000 [IU] | SUBCUTANEOUS | Status: DC
  Administered 2015-03-25 (×4): 1 [IU] via SUBCUTANEOUS
  Administered 2015-03-25: 2 [IU] via SUBCUTANEOUS
  Administered 2015-03-25 – 2015-03-26 (×2): 1 [IU] via SUBCUTANEOUS
  Administered 2015-03-26: 2 [IU] via SUBCUTANEOUS
  Administered 2015-03-26: 1 [IU] via SUBCUTANEOUS
  Administered 2015-03-26: 2 [IU] via SUBCUTANEOUS
  Administered 2015-03-26: 1 [IU] via SUBCUTANEOUS
  Administered 2015-03-26: 2 [IU] via SUBCUTANEOUS
  Administered 2015-03-27: 1 [IU] via SUBCUTANEOUS
  Administered 2015-03-27 (×3): 2 [IU] via SUBCUTANEOUS
  Administered 2015-03-28 (×2): 1 [IU] via SUBCUTANEOUS

## 2015-03-25 MED ORDER — POTASSIUM CHLORIDE CRYS ER 20 MEQ PO TBCR
40.0000 meq | EXTENDED_RELEASE_TABLET | Freq: Once | ORAL | Status: AC
  Administered 2015-03-25: 40 meq via ORAL
  Filled 2015-03-25: qty 2

## 2015-03-25 MED ORDER — IRBESARTAN 300 MG PO TABS
300.0000 mg | ORAL_TABLET | Freq: Every day | ORAL | Status: DC
  Administered 2015-03-25 – 2015-03-28 (×4): 300 mg via ORAL
  Filled 2015-03-25 (×4): qty 1

## 2015-03-25 MED ORDER — SODIUM CHLORIDE 0.9 % IV SOLN
INTRAVENOUS | Status: AC
  Administered 2015-03-25: 01:00:00 via INTRAVENOUS

## 2015-03-25 MED ORDER — CYANOCOBALAMIN 250 MCG PO TABS
250.0000 ug | ORAL_TABLET | Freq: Every day | ORAL | Status: DC
  Administered 2015-03-25 – 2015-03-28 (×4): 250 ug via ORAL
  Filled 2015-03-25 (×4): qty 1

## 2015-03-25 MED ORDER — DEXAMETHASONE 6 MG PO TABS
40.0000 mg | ORAL_TABLET | Freq: Every day | ORAL | Status: AC
  Administered 2015-03-25 – 2015-03-28 (×4): 40 mg via ORAL
  Filled 2015-03-25 (×4): qty 1

## 2015-03-25 MED ORDER — VITAMIN B 12 100 MCG PO LOZG: 1.0000 | LOZENGE | Freq: Every morning | ORAL | Status: DC

## 2015-03-25 NOTE — Progress Notes (Signed)
ANTICOAGULATION CONSULT NOTE - Follow Up Consult  Pharmacy Consult for heparin Indication: pulmonary embolus  Allergies  Allergen Reactions  . Shellfish Allergy Anaphylaxis  . Influenza Vaccines     Per pt can't take mess up with her immune system  . Morphine Other (See Comments)    Burns really bad    Patient Measurements: Height: 5\' 4"  (162.6 cm) Weight: 192 lb 14.4 oz (87.5 kg) IBW/kg (Calculated) : 54.7 Heparin Dosing Weight: 74 kg  Vital Signs: Temp: 97.9 F (36.6 C) (06/05 1557) Temp Source: Oral (06/05 1557) BP: 167/92 mmHg (06/05 1900) Pulse Rate: 103 (06/05 1900)  Labs:  Recent Labs  03/24/15 1614 03/24/15 2330 03/25/15 0220 03/25/15 1010 03/25/15 1700  HGB 12.2 11.9* 11.5*  --  12.4  HCT 36.4 34.5* 33.5*  --  36.2  PLT 21* 29* 31*  --  29*  HEPARINUNFRC  --   --   --   --  0.24*  CREATININE 1.05* 0.94 0.87  --   --   TROPONINI 0.16*  --   --  0.07*  --     Estimated Creatinine Clearance: 81 mL/min (by C-G formula based on Cr of 0.87).   Medications:  Scheduled:  . sodium chloride   Intravenous Once  . dexamethasone  40 mg Oral Daily  . irbesartan  300 mg Oral Daily   And  . hydrochlorothiazide  25 mg Oral Daily  . insulin aspart  1-3 Units Subcutaneous 6 times per day  . levofloxacin  500 mg Oral Daily  . vitamin B-12  250 mcg Oral Daily   Infusions:  . heparin 1,250 Units/hr (03/25/15 1147)    Assessment: 54 yo female with PE is currently on subtherapeutic heparin. Heparin level is 0.24.  Plt count is stable at 29 K (hematology is ok to continue).   Goal of Therapy:  Heparin level 0.3-0.7 units/ml Monitor platelets by anticoagulation protocol: Yes   Plan:  No heparin bolus due to thrombocytopenia Increase heparin to 1400 units/hr Check heparin level and CBC in 6 hrs Monitor closely for signs of bleeding  Pristine Gladhill, Tsz-Yin 03/25/2015,7:35 PM

## 2015-03-25 NOTE — Progress Notes (Signed)
eLink Physician-Brief Progress Note Patient Name: Janice Martin DOB: 12-18-1960 MRN: [id number]   Date of Service  03/25/2015  HPI/Events of Note  Hx ITP (on rituximab) with acute thrombocyto[penia, ? Related to consumption vs ITP. Now admitted w PE.   eICU Interventions  - agree with holding heparin for now, will need to determine when it would be safe to consider (ie when plt's reach a certain threshold) - Likely little downside to starting corticosteroids in event that this is her ITP causing acute drop plt. Will order solumedrol now.  - discuss further w H/O in am     Intervention Category Intermediate Interventions: Other:  Janice Bonham S. 03/25/2015, 2:51 AM

## 2015-03-25 NOTE — H&P (Signed)
PULMONARY / CRITICAL CARE MEDICINE HISTORY AND PHYSICAL EXAMINATION   Name: Janice Martin MRN: [id number] DOB: 1961/02/03    ADMISSION DATE:  03/24/2015  PRIMARY SERVICE: PCCM  CHIEF COMPLAINT:  PE  BRIEF PATIENT DESCRIPTION:  Janice Martin is a 17 F with ITP who presented to [facility] for persistent SOB following a recent URI and was found to have a PE. She was transferred to [facility] for further evaluation. She has no SOB at rest and is currently comfortable.     LINES / TUBES: PIV  CULTURES: None  ANTIBIOTICS: None   SIGNIFICANT EVENTS / STUDIES:  PLT count 21 CT-PE - Submassive PE   SUBJECTIVE:   03/25/15:   Chart review and patient hx: shows Chronic Relapsing ITP / sp./ RItuxan ending Oct 2014. Most recent OV with Dr Arelia Sneddon 03/09/15 - platelent count 255K (baseline 2016 is 56 t0 360 ; looks like she Rx herself with prn decadron). Patient and husband report: remote Iv IG Rx + without adverse effects. REmote platelet tx as well without side effect. Patient reports high bleeding risk when platelet count < 5K for her.   There has been 1 bleeding episode that was lif threatening at plat count 3K Otherwise, no bleeding episodes historically. Also, reports that even ongoing intermittently platelets drop to 50s usulally due to viral infection and Rx with Decadron prn which helps in a week or so. Most recently in spring 2016  PE risk factors: : RN reports past hx of DVT.  Prolonged periods of sitting at work. Overweight. Denies cancer or travel  At this point: patient and husband frustrated that neither thrombocytopenia nor PE have been addresed. She is running low grade fever and they are worrieda bout UTI/URI (sick contacts at work).     VITAL SIGNS: Temp:  [98.5 F (36.9 C)-100.6 F (38.1 C)] 98.5 F (36.9 C) (06/05 0747) Pulse Rate:  [99-127] 107 (06/05 0800) Resp:  [17-34] 23 (06/05 0800) BP: (133-171)/(72-113) 147/87 mmHg (06/05 0800) SpO2:  [94 %-100 %] 96 % (06/05  0800) Weight:  [87.5 kg (192 lb 14.4 oz)-87.998 kg (194 lb)] 87.5 kg (192 lb 14.4 oz) (06/05 0426)     Vitals review: AT its worst since arrival in ER: HR 124, RR  > 30 x few hours last night, started needing 1L [place] last night but this was for transfer, BP good throughout - PESI scoore 92, CLASS - 3 intermediate risk (assuming never hypoxenic)  Currently:  Currently HR 110, BP good 140 sbp. Good mental status, on 1L [place] but RN thinks she can wean off, RR 30. - PESI Score 32 Class-3 (Intermediate Risk, assuming not hypoxemic)        INTAKE / OUTPUT: Intake/Output      06/04 0701 - 06/05 0700 06/05 0701 - 06/06 0700   I.V. (mL/kg) 450 (5.1)    Total Intake(mL/kg) 450 (5.1)    Urine (mL/kg/hr) 500    Total Output 500     Net -50            PHYSICAL EXAMINATION: General:  NAD Neuro:  Intact HEENT:  Sclera anicteric, conjunctiva pink, MMM, OP clear Neck: Trachea supple and midline, (-) LAN or JVD Cardiovascular:  RRR, nS1/S2, (-) MRG. TACHYCARDIC HR 110 Lungs:  CTAB but TACHYPNEIC RR 30, Pulse ox 92% on RA x 20 minutes Abdomen:  S/NT/ND/(+)BS Musculoskeletal:  RT CALF WARM + Skin:  Intact Gen: no overt bleeding  LABS: PULMONARY No results for input(s): PHART, PCO2ART, PO2ART,  HCO3, TCO2, O2SAT in the last 168 hours.  Invalid input(s): PCO2, PO2  CBC  Recent Labs Lab 03/24/15 1614 03/24/15 2330 03/25/15 0220  HGB 12.2 11.9* 11.5*  HCT 36.4 34.5* 33.5*  WBC 20.5* 20.7* 20.6*  PLT 21* 29* 31*    COAGULATION No results for input(s): INR in the last 168 hours.  CARDIAC   Recent Labs Lab 03/24/15 1614  TROPONINI 0.16*   No results for input(s): PROBNP in the last 168 hours.   CHEMISTRY  Recent Labs Lab 03/24/15 1614 03/24/15 2330 03/25/15 0220  NA 139 138 139  K 3.1* 3.1* 3.4*  CL 103 102 104  CO2 28 25 26   GLUCOSE 104* 136* 117*  BUN 7 <5* 6  CREATININE 1.05* 0.94 0.87  CALCIUM 8.5* 8.6* 8.6*  MG  --  1.7  --    Estimated Creatinine Clearance:  81 mL/min (by C-G formula based on Cr of 0.87).   LIVER  Recent Labs Lab 03/24/15 1614 03/24/15 2330  AST 19 21  ALT 35 33  ALKPHOS 83 80  BILITOT 0.2* 0.5  PROT 6.8 6.4*  ALBUMIN 3.5 3.1*     INFECTIOUS No results for input(s): LATICACIDVEN, PROCALCITON in the last 168 hours.   ENDOCRINE CBG (last 3)   Recent Labs  03/24/15 2052 03/25/15 0019 03/25/15 0421  GLUCAP 112* 135* 126*         IMAGING x48h  - image(s) personally visualized  -   highlighted in bold Dg Chest 2 View  03/24/2015   CLINICAL DATA:  Upper respiratory infection.  Cough and congestion.  EXAM: CHEST - 2 VIEW  COMPARISON:  None.  FINDINGS: Heart size is normal. The lung volumes are somewhat low. No focal airspace disease is present. Surgical clips are present at the gallbladder fossa and near the left hemidiaphragm.  IMPRESSION: No acute cardiopulmonary disease.   Electronically Signed   By: San Morelle M.D.   On: 03/24/2015 15:24   Ct Angio Chest Pe W/cm &/or Wo Cm  03/24/2015   CLINICAL DATA:  Acute onset of cough, congestion and productive cough. Shortness of breath. Elevated D-dimer. Initial encounter.  EXAM: CT ANGIOGRAPHY CHEST WITH CONTRAST  TECHNIQUE: Multidetector CT imaging of the chest was performed using the standard protocol during bolus administration of intravenous contrast. Multiplanar CT image reconstructions and MIPs were obtained to evaluate the vascular anatomy.  CONTRAST:  146mL OMNIPAQUE IOHEXOL 350 MG/ML SOLN  COMPARISON:  Chest radiograph performed earlier today at 2:51 p.m.  FINDINGS: A saddle pulmonary embolus is noted, with pulmonary embolus extending into all lobes of both lungs. Large clot burden is seen. The RV/LV ratio of 1.64 is consistent with right heart strain and at least submassive pulmonary embolus.  Mild left basilar airspace opacity may reflect a small pulmonary infarct. There is minimal nonspecific ground-glass opacity at the left lung apex, measuring 0.7 cm  in size. There is no evidence of pleural effusion or pneumothorax. No abnormal focal contrast enhancement is seen.  Visualized mediastinal nodes are normal in size. No mediastinal lymphadenopathy is seen. No pericardial effusion is identified. The great vessels are grossly unremarkable in appearance. No axillary lymphadenopathy is seen. The thyroid gland is unremarkable in appearance.  The visualized portions of the liver are unremarkable. The patient is status post splenectomy. The patient is status post cholecystectomy, with clips noted at the gallbladder fossa. The visualized portions of the pancreas, adrenal glands and left kidney are within normal limits.  No acute osseous abnormalities  are seen.  Review of the MIP images confirms the above findings.  IMPRESSION: 1. Large saddle pulmonary embolus noted, with pulmonary embolus extending into all lobes of both lungs. CT evidence of right heart strain (RV/LV Ratio = 1.64) consistent with at least submassive (intermediate risk) PE. The presence of right heart strain has been associated with an increased risk of morbidity and mortality. Please activate Code PE by paging [phone]. 2. Mild left basilar airspace opacity may reflect a small pulmonary infarct. 3. Minimal nonspecific ground-glass opacity at the left lung apex, measuring 0.7 cm in size. Would perform initial follow-up CT of the chest at 3 months, to ensure persistence, as per Fleischner Society recommendations.  Critical Value/emergent results were called by telephone at the time of interpretation on 03/24/2015 at 6:12 pm to Dr. Veryl Speak, who verbally acknowledged these results.   Electronically Signed   By: Garald Balding M.D.   On: 03/24/2015 18:22         ASSESSMENT / PLAN:  Active Problems:   Saddle embolism of pulmonary artery   Saddle embolus of pulmonary artery   PULMONARY A: Acute Submassive PE with LUL GGO likely due to PE - PESI- Class 3 Intermediate Risk for 30d mortality  (tachycardic, tachypneic, submassive, trop leak)  -  Course complicaed by thrombocytopenia (plat count 30K)  due to ITP flare V Consumption - she feels ITP flare that is typical for her   : P:    Supplemental O2 to maintain sats >= 92% - currently not needing it  START IV heparin gtt after starting platelet transfusion - monitor for bleeding risk  - Long discussion with her husband - ITP risk for bleeding is generall low unless platelet count is < 20k and exponentially higher at < 10k (for her < 5K). Currently it is 31K. Platelets are indicated in bleeding situation in ITP per literature.  Certainly heparin IV gtt is going to increase risk for bleeding but without IV heparin she is at significant risk of PE getting worse and dying.   - To balance risk of bleeding v PE deterioration: no indication for lytic right now. Rx with IV heparin gtt (no bolus), Give platelet tranfusion to get platelket count > 50k to minimize risk of bleeing, Rx ITP possibility with decadron  Duple LE - likel has DVT  Avoid IVC filter to extent possible  Follow-up CT in 3 months to confirm persistence of GGO, then yearly x 3 years  CARDIOVASCULAR A: HTN:  P:   Cont OP ARB/HCTZ Hold BBs for now  RENAL A: Hypokalemia:  Hypomagnesemia - mild < 2gm% P:   Repelte  GASTROINTESTINAL A: No acute issues P Soft solids  HEMATOLOGIC A: #baseline Chronic RElapsin ITP Hx - Dr Marin Olp  Chronic Relapsing ITP / sp./ RItuxan ending Oct 2014. Most recent OV with Dr Arelia Sneddon 03/09/15 - platelent count 255K (baseline 2016 is 56 t0 360 ; looks like she Rx herself with prn decadron). Patient and husband report: remote Iv IG Rx + without adverse effects. REmote platelet tx as well without side effect. Patient reports high bleeding risk when platelet count < 5K for her.   There has been 1 bleeding episode that was lif threatening at plat count 3K Otherwise, no bleeding episodes historically. Also, reports that even ongoing  intermittently platelets drop to 50s usulally due to viral infection and Rx with Decadron prn which helps in a week or so. Most recently in spring 2016    #Currently  -  Thrombocytopenia - 31K - ITP flare v consumptiopns  P:   Monitor Platelet transfusion while getting IV hepari - goal > 50K High dose decardon x 4 days starting 03/25/15 for possible ITP flare Avoid Iv IG - risk for antibodies and 10% risk for DVT/PE   INFECTIOUS A: Low grade fever +  - likeluy due to PE   P PCT profile Lactic acid Blood culture Urine culture  If abnormal start abx  ENDOCRINE A: Elevated blood glucose:  P:   Check A1c SSI  NEUROLOGIC A: Anxiety:  P:   Monitor  BEST PRACTICE / DISPOSITION Level of Care:  ICU Primary Service:  PCCM Consultants:  Heme/Onc - dw Dr Alvy Bimler - she will be by this morning.  Code Status:  Full Diet:  Regular DVT Px:  As above GI Px:  Not indicated Skin Integrity:  Intact Social / Family:  Updated at bedside  TODAY'S SUMMARY:  Submassive PE, High risk deteriopration without Rx. High risk bleeding with Rx. Rx with IV heparin and platelet/decadron. Monitor ICU closely.   D.w DR Alvy Bimler - heme and patient/husban. All in agreement with trade off of risk v benefit   The patient is critically ill with multiple organ systems failure and requires high complexity decision making for assessment and support, frequent evaluation and titration of therapies, application of advanced monitoring technologies and extensive interpretation of multiple databases.   Critical Care Time devoted to patient care services described in this note is  60  Minutes. This time reflects time of care of this signee Dr Brand Males. This critical care time does not reflect procedure time, or teaching time or supervisory time of PA/NP/Med student/Med Resident etc but could involve care discussion time    Dr. Brand Males, M.D., [facility].C.P Pulmonary and Critical Care Medicine Staff  Physician [facility] Pager: [phone], If no answer or between  15:00h - 7:00h: call [phone]  03/25/2015 8:45 AM

## 2015-03-25 NOTE — Consult Note (Signed)
[facility] NOTE  Patient Care Team: Rowe Clack, MD as PCP - General [name], MD (Hematology and Oncology) Alden Hipp, MD (Obstetrics and Gynecology)  CHIEF COMPLAINTS/PURPOSE OF CONSULTATION:  Severe recurrent thrombocytopenia with a saddle embolus  HISTORY OF PRESENTING ILLNESS:  Janice Martin 54 y.o. female is here because of thrombocytopenia. She was seen by Dr. and never on a regular basis for history of recurrent ITP. The patient was previously treated with corticosteroids, Promacta, splenectomy and rituximab. With Promacta treatment, the patient developed complication with right lower extremity DVT, detected on ultrasound venous Doppler dated 07/19/2010 and was treated appropriately with anticoagulation therapy with Arixtra. She was recently treated with a course of corticotherapy in April 2016 for recurrent ITP which was successfully weaned off dexamethasone. She has been complaining of flulike illness for the past week and went to urgent care for evaluation for fatigue. Troponin was elevated prompted further evaluation and CT angiogram reveals significant saddle emboli and she was hence transferred here for further management. She denies fevers or chills. She had nonproductive cough. She denies dysuria, urinary frequency or urgency.  From a blood clot standpoint and risk factors, she denies recent traveling. She has a sedentary work and she sits at work mainly. She denies recent dehydration. She denies recent hormonal therapy. The patient had multiple prior surgeries including C-section, gallbladder surgery and splenectomy without post surgical thrombotic complication. She also had 2 successful pregnancies. She denies recent bruising/bleeding, such as spontaneous epistaxis, hematuria, melena or hematochezia Currently, she denies chest pain or shortness of breath. Denies diaphoresis or dizziness.   MEDICAL HISTORY:  Past Medical History   Diagnosis Date  . ANEMIA-IRON DEFICIENCY   . Immune thrombocytopenic purpura 2003 dx    s/p rituxan 06/2012 - now remission  . LEUKOCYTOSIS UNSPECIFIED   . Anxiety state, unspecified   . HYPERTENSION     SURGICAL HISTORY: Past Surgical History  Procedure Laterality Date  . Cholecystectomy    . Spleenectomy    . Cesarean section      x's 2    SOCIAL HISTORY: History   Social History  . Marital Status: Married    Spouse Name: N/A  . Number of Children: N/A  . Years of Education: N/A   Occupational History  . Not on file.   Social History Main Topics  . Smoking status: Never Smoker   . Smokeless tobacco: Never Used     Comment: never used product  . Alcohol Use: No  . Drug Use: No  . Sexual Activity: Not on file   Other Topics Concern  . Not on file   Social History Narrative    FAMILY HISTORY: Family History  Problem Relation Age of Onset  . Arthritis Mother   . Arthritis Father   . Mental illness Father   . Hypertension Other     Parent  . Heart disease Other     Grandparent  . Stroke Other     grandparent    ALLERGIES:  is allergic to shellfish allergy; influenza vaccines; and morphine.  MEDICATIONS:  Current Facility-Administered Medications  Medication Dose Route Frequency Provider Last Rate Last Dose  . 0.9 %  sodium chloride infusion   Intravenous STAT Veryl Speak, MD 75 mL/hr at 03/25/15 0100    . 0.9 %  sodium chloride infusion  250 mL Intravenous PRN Vishal Mungal, MD      . 0.9 %  sodium chloride infusion   Intravenous Once Vishal  Stevenson Clinch, MD   Stopped at 03/24/15 2328  . dexamethasone (DECADRON) tablet 40 mg  40 mg Oral Daily Brand Males, MD      . heparin ADULT infusion 100 units/mL (25000 units/250 mL)  1,250 Units/hr Intravenous Continuous Norva Riffle, RPH      . irbesartan (AVAPRO) tablet 300 mg  300 mg Oral Daily Mariea Clonts, MD       And  . hydrochlorothiazide (HYDRODIURIL) tablet 25 mg  25 mg Oral Daily Mariea Clonts, MD      . insulin aspart (novoLOG) injection 1-3 Units  1-3 Units Subcutaneous 6 times per day Mariea Clonts, MD   1 Units at 03/25/15 [phone]  . levofloxacin (LEVAQUIN) tablet 500 mg  500 mg Oral Daily Myrna Vonseggern, MD      . magnesium sulfate IVPB 2 g 50 mL  2 g Intravenous Once Brand Males, MD      . vitamin B-12 (CYANOCOBALAMIN) tablet 250 mcg  250 mcg Oral Daily Mariea Clonts, MD        REVIEW OF SYSTEMS:   Constitutional: Denies fevers, chills or abnormal night sweats Eyes: Denies blurriness of vision, double vision or watery eyes Ears, nose, mouth, throat, and face: Denies mucositis or sore throat Cardiovascular: Denies palpitation, chest discomfort or lower extremity swelling Gastrointestinal:  Denies nausea, heartburn or change in bowel habits Skin: Denies abnormal skin rashes Lymphatics: Denies new lymphadenopathy or easy bruising Neurological:Denies numbness, tingling or new weaknesses Behavioral/Psych: Mood is stable, no new changes  All other systems were reviewed with the patient and are negative.  PHYSICAL EXAMINATION: ECOG PERFORMANCE STATUS: 1 - Symptomatic but completely ambulatory  Filed Vitals:   03/25/15 1000  BP: 157/93  Pulse: 113  Temp: 98.2 F (36.8 C)  Resp: 18   Filed Weights   03/24/15 1434 03/24/15 2130 03/25/15 0426  Weight: 194 lb (87.998 kg) 193 lb 12.6 oz (87.9 kg) 192 lb 14.4 oz (87.5 kg)    GENERAL:alert, no distress and comfortable SKIN: skin color, texture, turgor are normal, no rashes or significant lesions EYES: normal, conjunctiva are pink and non-injected, sclera clear OROPHARYNX:no exudate, no erythema and lips, buccal mucosa, and tongue normal  NECK: supple, thyroid normal size, non-tender, without nodularity LYMPH:  no palpable lymphadenopathy in the cervical, axillary or inguinal LUNGS: clear to auscultation and percussion with normal breathing effort HEART: she has tachycardia without murmurs. Noted bilateral lower  extremity edema, right greater than the left with prominent veins.  ABDOMEN:abdomen soft, non-tender and normal bowel sounds Musculoskeletal:no cyanosis of digits and no clubbing  PSYCH: alert & oriented x 3 with fluent speech NEURO: no focal motor/sensory deficits  LABORATORY DATA:  I have reviewed the data as listed Lab Results  Component Value Date   WBC 20.6* 03/25/2015   HGB 11.5* 03/25/2015   HCT 33.5* 03/25/2015   MCV 91.0 03/25/2015   PLT 31* 03/25/2015    RADIOGRAPHIC STUDIES: I have personally reviewed the radiological images as listed and agreed with the findings in the report. Dg Chest 2 View  03/24/2015   CLINICAL DATA:  Upper respiratory infection.  Cough and congestion.  EXAM: CHEST - 2 VIEW  COMPARISON:  None.  FINDINGS: Heart size is normal. The lung volumes are somewhat low. No focal airspace disease is present. Surgical clips are present at the gallbladder fossa and near the left hemidiaphragm.  IMPRESSION: No acute cardiopulmonary disease.   Electronically Signed   By: Wynetta Fines.D.  On: 03/24/2015 15:24   Ct Angio Chest Pe W/cm &/or Wo Cm  03/24/2015   CLINICAL DATA:  Acute onset of cough, congestion and productive cough. Shortness of breath. Elevated D-dimer. Initial encounter.  EXAM: CT ANGIOGRAPHY CHEST WITH CONTRAST  TECHNIQUE: Multidetector CT imaging of the chest was performed using the standard protocol during bolus administration of intravenous contrast. Multiplanar CT image reconstructions and MIPs were obtained to evaluate the vascular anatomy.  CONTRAST:  163mL OMNIPAQUE IOHEXOL 350 MG/ML SOLN  COMPARISON:  Chest radiograph performed earlier today at 2:51 p.m.  FINDINGS: A saddle pulmonary embolus is noted, with pulmonary embolus extending into all lobes of both lungs. Large clot burden is seen. The RV/LV ratio of 1.64 is consistent with right heart strain and at least submassive pulmonary embolus.  Mild left basilar airspace opacity may reflect a  small pulmonary infarct. There is minimal nonspecific ground-glass opacity at the left lung apex, measuring 0.7 cm in size. There is no evidence of pleural effusion or pneumothorax. No abnormal focal contrast enhancement is seen.  Visualized mediastinal nodes are normal in size. No mediastinal lymphadenopathy is seen. No pericardial effusion is identified. The great vessels are grossly unremarkable in appearance. No axillary lymphadenopathy is seen. The thyroid gland is unremarkable in appearance.  The visualized portions of the liver are unremarkable. The patient is status post splenectomy. The patient is status post cholecystectomy, with clips noted at the gallbladder fossa. The visualized portions of the pancreas, adrenal glands and left kidney are within normal limits.  No acute osseous abnormalities are seen.  Review of the MIP images confirms the above findings.  IMPRESSION: 1. Large saddle pulmonary embolus noted, with pulmonary embolus extending into all lobes of both lungs. CT evidence of right heart strain (RV/LV Ratio = 1.64) consistent with at least submassive (intermediate risk) PE. The presence of right heart strain has been associated with an increased risk of morbidity and mortality. Please activate Code PE by paging [phone]. 2. Mild left basilar airspace opacity may reflect a small pulmonary infarct. 3. Minimal nonspecific ground-glass opacity at the left lung apex, measuring 0.7 cm in size. Would perform initial follow-up CT of the chest at 3 months, to ensure persistence, as per Fleischner Society recommendations.  Critical Value/emergent results were called by telephone at the time of interpretation on 03/24/2015 at 6:12 pm to Dr. Veryl Speak, who verbally acknowledged these results.   Electronically Signed   By: Garald Balding M.D.   On: 03/24/2015 18:22   I REVIEW HER PERIPHERAL BLOOD SMEAR. THERE IS ABSOLUTE THROMBOCYTOPENIA WITHOUT PLATELET CLUMPING.  NO SCHISTOCYTES WERE SEEN.   REACTIVE LEUKOCYTOSIS WITH LEFT SHIFT IS NOTED.  ASSESSMENT & PLAN  Recurrent ITP, likely triggered by infection Significant leukocytosis, likely reactive in nature, source of infection indeterminate, likely upper respiratory tract  Severe thrombocytopenia in the setting of acute pulmonary emboli  For treatment of recurrent ITP, I agree with high-dose dexamethasone along with empiric antibiotic coverage. I believe levofloxacin would be a good choice and I prefer to avoid agents such as cefepime or vancomycin due to significant toxicity to platelets with those agents.  Due to acute issue needing anticoagulation therapy, I agree with platelet transfusion to minimize risk of bleeding. She would need very close monitoring of her platelet count over the next 24 hours and I will order that to be done along with aPTT monitoring. The goal would be to try to keep her platelet count greater than 50,000 if possible. I would not advocate  the use of IVIG, Promacta or growth factor injections such as Nplate to bring her platelet count due to risks of thrombosis.  Acute saddle emboli History of right lower extremity blood clot Severe thrombocytopenia This is a challenging case. The patient has developed heart strain from the severe saddle emboli. I believe the most likely source of blood clot is likely from her right lower extremity. I recommend Doppler ultrasound bilateral lower extremity for evaluation. The patient may benefit consideration for IVC filter placement tomorrow if she does not improve with anticoagulation therapy or if she developed bleeding complication. I agree with IV heparin to start treatment without bolus injection along with platelet transfusions to support her platelet count. Both the patient and her husband is aware of the gravity of her situation. The patient will likely need long-term anticoagulation therapy due to recurrent thrombotic episodes.  Dr. Marin Olp will resume care  tomorrow. Please do not hesitate to contact me today if she develop any form of complications.

## 2015-03-25 NOTE — Progress Notes (Signed)
VASCULAR LAB PRELIMINARY  PRELIMINARY  PRELIMINARY  PRELIMINARY  Bilateral lower extremity venous duplex  completed.    Preliminary report:  Right:  DVT noted in the FV, popliteal v, gastrocnemius v, and peroneal v.  No evidence of superficial thrombosis.  No Baker's cyst.  Left:  No evidence of DVT, superficial thrombosis, or Baker's cyst.  Marionette Meskill, RVT 03/25/2015, 1:08 PM

## 2015-03-25 NOTE — Progress Notes (Signed)
ANTICOAGULATION CONSULT NOTE - Initial Consult  Pharmacy Consult for Heparin Indication: pulmonary embolus  Allergies  Allergen Reactions  . Shellfish Allergy Anaphylaxis  . Influenza Vaccines     Per pt can't take mess up with her immune system  . Morphine     Patient Measurements: Height: 5\' 4"  (162.6 cm) Weight: 192 lb 14.4 oz (87.5 kg) IBW/kg (Calculated) : 54.7 Heparin Dosing Weight: 74 kg  Vital Signs: Temp: 97.8 F (36.6 C) (06/05 0930) Temp Source: Oral (06/05 0930) BP: 155/95 mmHg (06/05 0930) Pulse Rate: 116 (06/05 0930)  Labs:  Recent Labs  03/24/15 1614 03/24/15 2330 03/25/15 0220  HGB 12.2 11.9* 11.5*  HCT 36.4 34.5* 33.5*  PLT 21* 29* 31*  CREATININE 1.05* 0.94 0.87  TROPONINI 0.16*  --   --     Estimated Creatinine Clearance: 81 mL/min (by C-G formula based on Cr of 0.87).   Medical History: Past Medical History  Diagnosis Date  . ANEMIA-IRON DEFICIENCY   . Immune thrombocytopenic purpura 2003 dx    s/p rituxan 06/2012 - now remission  . LEUKOCYTOSIS UNSPECIFIED   . Anxiety state, unspecified   . HYPERTENSION     Medications:  Infusions:    Assessment: 54 year old female with a history of chronic relapsing ITP and now acute thrombocytopenia who is admitted with PE.  She requires anticoagulation for PE but is at significant risk of adverse effects due to her low platelet count.  Discussed with Dr. Chase Caller and will proceed with heparin infusion to target the usual therapeutic range but with no bolus.  Will use [place] nomogram to guide initial rate selection.  Goal of Therapy:  Heparin level 0.3-0.7 units/ml Monitor platelets by anticoagulation protocol: Yes   Plan:  No heparin bolus due to thrombocytopenia Start Heparin infusion at 1250 units/hr Check heparin level and CBC in 6 hours and daily Monitor closely for bleeding complications  Legrand Como, Pharm.D., BCPS, AAHIVP Clinical Pharmacist Phone: [phone] or  [phone] 03/25/2015, 9:51 AM

## 2015-03-25 NOTE — Progress Notes (Signed)
Critical platelet level of 29K called to Dr Salley Hews at [facility]

## 2015-03-26 LAB — CBC
HEMATOCRIT: 33.3 % — AB (ref 36.0–46.0)
HEMOGLOBIN: 11.5 g/dL — AB (ref 12.0–15.0)
MCH: 31.1 pg (ref 26.0–34.0)
MCHC: 34.5 g/dL (ref 30.0–36.0)
MCV: 90 fL (ref 78.0–100.0)
Platelets: 53 10*3/uL — ABNORMAL LOW (ref 150–400)
RBC: 3.7 MIL/uL — ABNORMAL LOW (ref 3.87–5.11)
RDW: 13.9 % (ref 11.5–15.5)
WBC: 33.9 10*3/uL — ABNORMAL HIGH (ref 4.0–10.5)

## 2015-03-26 LAB — BASIC METABOLIC PANEL
ANION GAP: 9 (ref 5–15)
BUN: 10 mg/dL (ref 6–20)
CO2: 25 mmol/L (ref 22–32)
CREATININE: 0.83 mg/dL (ref 0.44–1.00)
Calcium: 8.4 mg/dL — ABNORMAL LOW (ref 8.9–10.3)
Chloride: 105 mmol/L (ref 101–111)
GFR calc Af Amer: >60 mL/min (ref >60)
GLUCOSE: 167 mg/dL — AB (ref 65–99)
Potassium: 3.4 mmol/L — ABNORMAL LOW (ref 3.5–5.1)
SODIUM: 139 mmol/L (ref 135–145)

## 2015-03-26 LAB — HEPARIN LEVEL (UNFRACTIONATED): HEPARIN UNFRACTIONATED: 0.51 [IU]/mL (ref 0.30–0.70)

## 2015-03-26 LAB — PREPARE PLATELET PHERESIS
Unit division: 0
Unit division: 0

## 2015-03-26 LAB — PHOSPHORUS: Phosphorus: 3.8 mg/dL (ref 2.5–4.6)

## 2015-03-26 LAB — PROCALCITONIN: Procalcitonin: <0.1 ng/mL

## 2015-03-26 LAB — GLUCOSE, CAPILLARY
GLUCOSE-CAPILLARY: 146 mg/dL — AB (ref 65–99)
Glucose-Capillary: 156 mg/dL — ABNORMAL HIGH (ref 65–99)
Glucose-Capillary: 145 mg/dL — ABNORMAL HIGH (ref 65–99)
Glucose-Capillary: 154 mg/dL — ABNORMAL HIGH (ref 65–99)
Glucose-Capillary: 163 mg/dL — ABNORMAL HIGH (ref 65–99)
Glucose-Capillary: 129 mg/dL — ABNORMAL HIGH (ref 65–99)

## 2015-03-26 LAB — URINE CULTURE
COLONY COUNT: NO GROWTH
Culture: NO GROWTH

## 2015-03-26 LAB — MAGNESIUM: MAGNESIUM: 2.2 mg/dL (ref 1.7–2.4)

## 2015-03-26 LAB — HEMOGLOBIN A1C
Hgb A1c MFr Bld: 6.1 % — ABNORMAL HIGH (ref 4.8–5.6)
Mean Plasma Glucose: 128 mg/dL

## 2015-03-26 LAB — TROPONIN I: Troponin I: 0.03 ng/mL (ref <0.031)

## 2015-03-26 MED ORDER — POTASSIUM CHLORIDE CRYS ER 20 MEQ PO TBCR
40.0000 meq | EXTENDED_RELEASE_TABLET | Freq: Once | ORAL | Status: AC
  Administered 2015-03-26: 40 meq via ORAL
  Filled 2015-03-26: qty 2

## 2015-03-26 MED ORDER — ENOXAPARIN SODIUM 100 MG/ML ~~LOC~~ SOLN
85.0000 mg | Freq: Two times a day (BID) | SUBCUTANEOUS | Status: DC
  Administered 2015-03-26 (×2): 85 mg via SUBCUTANEOUS
  Filled 2015-03-26 (×4): qty 1

## 2015-03-26 NOTE — Progress Notes (Signed)
PULMONARY / CRITICAL CARE MEDICINE HISTORY AND PHYSICAL EXAMINATION   Name: Janice Martin MRN: [id number] DOB: [birth year]    ADMISSION DATE:  03/24/2015  PRIMARY SERVICE: PCCM  CHIEF COMPLAINT:  PE  BRIEF PATIENT DESCRIPTION:  Janice Martin is a 22 F with ITP who presented to [facility] for persistent SOB following a recent URI and was found to have a PE. She was transferred to [facility] for further evaluation. She has no SOB at rest and is currently comfortable.   Chart review and patient hx: shows Chronic Relapsing ITP / sp./ RItuxan ending Oct 2014. Most recent OV with Dr Arelia Sneddon 03/09/15 - platelent count 255K (baseline 2016 is 56 t0 360 ; looks like she Rx herself with prn decadron). Patient and husband report: remote Iv IG Rx + without adverse effects. REmote platelet tx as well without side effect. Patient reports high bleeding risk when platelet count < 5K for her.   There has been 1 bleeding episode that was lif threatening at plat count 3K Otherwise, no bleeding episodes historically. Also, reports that even ongoing intermittently platelets drop to 50s usulally due to viral infection and Rx with Decadron prn which helps in a week or so. Most recently in spring 2016  LINES / TUBES: PIV  CULTURES: None  ANTIBIOTICS: None   SIGNIFICANT EVENTS / STUDIES:  PLT count 21 CT-PE - Submassive PE 6/5 duplex >>Right: DVT noted in the FV, popliteal v, gastrocnemius v, and peroneal v.   SUBJECTIVE:  Afebrile breathing ok   No chest pain   VITAL SIGNS: Temp:  [97.5 F (36.4 C)-98.7 F (37.1 C)] 98.7 F (37.1 C) (06/06 0749) Pulse Rate:  [86-110] 87 (06/06 0700) Resp:  [13-29] 16 (06/06 0700) BP: (151-181)/(80-111) 154/81 mmHg (06/06 0700) SpO2:  [92 %-98 %] 94 % (06/06 0700) Weight:  [86.9 kg (191 lb 9.3 oz)] 86.9 kg (191 lb 9.3 oz) (06/06 0500)    Gen. Pleasant, well-nourished, in no distress, normal affect ENT - no lesions, no post nasal drip Neck: No JVD, no thyromegaly, no  carotid bruits Lungs: no use of accessory muscles, no dullness to percussion, clear without rales or rhonchi  Cardiovascular: Rhythm regular, heart sounds  normal, no murmurs or gallops, no peripheral edema Abdomen: soft and non-tender, no hepatosplenomegaly, BS normal. Musculoskeletal: No deformities, no cyanosis or clubbing Neuro:  alert, non focal         INTAKE / OUTPUT: Intake/Output      06/05 0701 - 06/06 0700 06/06 0701 - 06/07 0700   I.V. (mL/kg) 1661.4 (19.1) 48 (0.6)   Blood 523    IV Piggyback 50    Total Intake(mL/kg) 2234.4 (25.7) 48 (0.6)   Urine (mL/kg/hr) 1700 (0.8) 500 (1.4)   Total Output 1700 500   Net +534.4 -452          PHYSICAL EXAMINATION: General:  NAD Neuro:  Intact HEENT:  Sclera anicteric, conjunctiva pink, MMM, OP clear Neck: Trachea supple and midline, (-) LAN or JVD Cardiovascular:  RRR, nS1/S2, (-) MRG. TACHYCARDIC HR 110 Lungs:  CTAB  Abdomen:  S/NT/ND/(+)BS Musculoskeletal:  RT CALF WARM + Skin:  Intact Gen: no overt bleeding  LABS: PULMONARY No results for input(s): PHART, PCO2ART, PO2ART, HCO3, TCO2, O2SAT in the last 168 hours.  Invalid input(s): PCO2, PO2  CBC  Recent Labs Lab 03/25/15 0220 03/25/15 1700 03/26/15 0140  HGB 11.5* 12.4 11.5*  HCT 33.5* 36.2 33.3*  WBC 20.6* 26.3* 33.9*  PLT 31* 29* 53*  COAGULATION No results for input(s): INR in the last 168 hours.  CARDIAC    Recent Labs Lab 03/24/15 1614 03/25/15 1010 03/26/15 0140  TROPONINI 0.16* 0.07* 0.03   No results for input(s): PROBNP in the last 168 hours.   CHEMISTRY  Recent Labs Lab 03/24/15 1614 03/24/15 2330 03/25/15 0220 03/26/15 0140  NA 139 138 139 139  K 3.1* 3.1* 3.4* 3.4*  CL 103 102 104 105  CO2 28 25 26 25   GLUCOSE 104* 136* 117* 167*  BUN 7 <5* 6 10  CREATININE 1.05* 0.94 0.87 0.83  CALCIUM 8.5* 8.6* 8.6* 8.4*  MG  --  1.7  --  2.2  PHOS  --   --   --  3.8   Estimated Creatinine Clearance: 84.6 mL/min (by C-G  formula based on Cr of 0.83).   LIVER  Recent Labs Lab 03/24/15 1614 03/24/15 2330  AST 19 21  ALT 35 33  ALKPHOS 83 80  BILITOT 0.2* 0.5  PROT 6.8 6.4*  ALBUMIN 3.5 3.1*     INFECTIOUS  Recent Labs Lab 03/25/15 1010 03/26/15 0140  LATICACIDVEN 1.2  --   PROCALCITON 0.11 <0.10     ENDOCRINE CBG (last 3)   Recent Labs  03/26/15 0006 03/26/15 0408 03/26/15 0744  GLUCAP 146* 156* 145*         IMAGING x48h  - image(s) personally visualized  -   highlighted in bold Dg Chest 2 View  03/24/2015   CLINICAL DATA:  Upper respiratory infection.  Cough and congestion.  EXAM: CHEST - 2 VIEW  COMPARISON:  None.  FINDINGS: Heart size is normal. The lung volumes are somewhat low. No focal airspace disease is present. Surgical clips are present at the gallbladder fossa and near the left hemidiaphragm.  IMPRESSION: No acute cardiopulmonary disease.   Electronically Signed   By: San Morelle M.D.   On: 03/24/2015 15:24   Ct Angio Chest Pe W/cm &/or Wo Cm  03/24/2015   CLINICAL DATA:  Acute onset of cough, congestion and productive cough. Shortness of breath. Elevated D-dimer. Initial encounter.  EXAM: CT ANGIOGRAPHY CHEST WITH CONTRAST  TECHNIQUE: Multidetector CT imaging of the chest was performed using the standard protocol during bolus administration of intravenous contrast. Multiplanar CT image reconstructions and MIPs were obtained to evaluate the vascular anatomy.  CONTRAST:  190mL OMNIPAQUE IOHEXOL 350 MG/ML SOLN  COMPARISON:  Chest radiograph performed earlier today at 2:51 p.m.  FINDINGS: A saddle pulmonary embolus is noted, with pulmonary embolus extending into all lobes of both lungs. Large clot burden is seen. The RV/LV ratio of 1.64 is consistent with right heart strain and at least submassive pulmonary embolus.  Mild left basilar airspace opacity may reflect a small pulmonary infarct. There is minimal nonspecific ground-glass opacity at the left lung apex, measuring  0.7 cm in size. There is no evidence of pleural effusion or pneumothorax. No abnormal focal contrast enhancement is seen.  Visualized mediastinal nodes are normal in size. No mediastinal lymphadenopathy is seen. No pericardial effusion is identified. The great vessels are grossly unremarkable in appearance. No axillary lymphadenopathy is seen. The thyroid gland is unremarkable in appearance.  The visualized portions of the liver are unremarkable. The patient is status post splenectomy. The patient is status post cholecystectomy, with clips noted at the gallbladder fossa. The visualized portions of the pancreas, adrenal glands and left kidney are within normal limits.  No acute osseous abnormalities are seen.  Review of the MIP images confirms  the above findings.  IMPRESSION: 1. Large saddle pulmonary embolus noted, with pulmonary embolus extending into all lobes of both lungs. CT evidence of right heart strain (RV/LV Ratio = 1.64) consistent with at least submassive (intermediate risk) PE. The presence of right heart strain has been associated with an increased risk of morbidity and mortality. Please activate Code PE by paging [phone]. 2. Mild left basilar airspace opacity may reflect a small pulmonary infarct. 3. Minimal nonspecific ground-glass opacity at the left lung apex, measuring 0.7 cm in size. Would perform initial follow-up CT of the chest at 3 months, to ensure persistence, as per Fleischner Society recommendations.  Critical Value/emergent results were called by telephone at the time of interpretation on 03/24/2015 at 6:12 pm to Dr. Veryl Speak, who verbally acknowledged these results.   Electronically Signed   By: Garald Balding M.D.   On: 03/24/2015 18:22         ASSESSMENT / PLAN:  Active Problems:   Saddle embolism of pulmonary artery   Saddle embolus of pulmonary artery   Hypokalemia   Severe thrombocytopenia   ITP (idiopathic thrombocytopenic purpura)   PULMONARY A: Acute  Submassive PE with LUL GGO likely due to PE - PESI- Class 3 Intermediate Risk for 30d mortality (tachycardic, tachypneic, submassive, trop leak)  -  Course complicaed by thrombocytopenia (plat count 30K)  due to ITP flare V Consumption - she feels ITP flare that is typical for her   : P:    Change to lovenox per pharmacy Avoid IVC filter unless bleeding  Follow-up CT in 3 months to confirm persistence of GGO, then yearly x 3 years  CARDIOVASCULAR A: HTN:  P:   Cont OP ARB/HCTZ Hold BBs for now  RENAL A: Hypokalemia:  Hypomagnesemia - mild P:   Repelte  GASTROINTESTINAL A: No acute issues P Soft solids  HEMATOLOGIC A: #baseline Chronic RElapsing ITP Hx - Dr Marin Olp  Chronic Relapsing ITP / sp./ RItuxan ending Oct 2014. Most recent OV with Dr Arelia Sneddon 03/09/15 - platelent count 255K (baseline 2016 is 56 t0 360 ; looks like she Rx herself with prn decadron). Patient and husband report: remote Iv IG Rx + without adverse effects. REmote platelet tx as well without side effect. Patient reports high bleeding risk when platelet count < 5K for her.   There has been 1 bleeding episode that was lif threatening at plat count 3K Otherwise, no bleeding episodes historically. Also, reports that even ongoing intermittently platelets drop to 50s usulally due to viral infection and Rx with Decadron prn which helps in a week or so. Most recently in spring 2016    #Currently  - Thrombocytopenia -  P:   Monitor Platelet transfusion while getting IV hepari - goal > 50K High dose decardon x 4 days starting 03/25/15 for possible ITP flare Avoid Iv IG - risk for antibodies and 10% risk for DVT/PE   INFECTIOUS A: Low grade fever +  - likely due to PE   P Empiric levaquin x 7ds for sinusitis  ENDOCRINE A: Elevated blood glucose:  P:   Check A1c SSI  NEUROLOGIC A: Anxiety:  P:   Monitor    TODAY'S SUMMARY:  Submassive PE, High risk deteriopration without Rx. High risk bleeding  with Rx. Rx with IV heparin and platelet/decadron. Can transfer to tele   Care during the described time interval was provided by me and/or other providers on the critical care team.  I have reviewed this patient's available data, including  medical history, events of note, physical examination and test results as part of my evaluation  CC time x  38m  Kara Mead MD. FCCP. [facility] Pager [phone] If no response call [phone]     03/26/2015 11:12 AM

## 2015-03-26 NOTE — Progress Notes (Signed)
Patient trasfered from 17M to 5W01 via wheelchair; alert and oriented x 4; no complaints of pain; IV saline locked in LFA and LAC; skin intact; husband at bedside. Orient patient to room and unit; instructed how to use the call bell and  fall risk precautions. Will continue to monitor the patient.

## 2015-03-26 NOTE — Progress Notes (Signed)
ANTICOAGULATION CONSULT NOTE - Follow Up Consult  Pharmacy Consult for Switch Heparin to Lovenox per Dr. Elsworth Soho Indication: pulmonary embolus  Allergies  Allergen Reactions  . Shellfish Allergy Anaphylaxis  . Influenza Vaccines     Per pt can't take mess up with her immune system  . Morphine Other (See Comments)    Burns really bad   Patient Measurements: Height: 5\' 4"  (162.6 cm) Weight: 191 lb 9.3 oz (86.9 kg) IBW/kg (Calculated) : 54.7 Vital Signs: Temp: 98.7 F (37.1 C) (06/06 0749) Temp Source: Oral (06/06 0749) BP: 154/81 mmHg (06/06 0700) Pulse Rate: 87 (06/06 0700)  Labs:  Recent Labs  03/24/15 1614 03/24/15 2330 03/25/15 0220 03/25/15 1010 03/25/15 1700 03/26/15 0140  HGB 12.2 11.9* 11.5*  --  12.4 11.5*  HCT 36.4 34.5* 33.5*  --  36.2 33.3*  PLT 21* 29* 31*  --  29* 53*  HEPARINUNFRC  --   --   --   --  0.24* 0.51  CREATININE 1.05* 0.94 0.87  --   --  0.83  TROPONINI 0.16*  --   --  0.07*  --  0.03    Estimated Creatinine Clearance: 84.6 mL/min (by C-G formula based on Cr of 0.83).  Assessment: 54 year old female on IV heparin for saddle PE with recurrent ITP (currently platelets 52K) to change to Lovenox per request of Dr. Elsworth Soho. Heme is seeing patient as well. Discussed platelets and longer time to reversal with Dr. Elsworth Soho - he would like to continue with this therapy to provide ease of administration for this patient.   H/H 11.5/33.3, Platelets 53K (up today). No bleeding noted. SCr 0.83 (stable).   Goal of Therapy:  Anti-Xa level 0.6-1 units/ml 4hrs after LMWH dose given if appropriate.  Monitor platelets by anticoagulation protocol: Yes   Plan:  D/C IV heparin.  Start Lovenox 85 mg SQ every 12 hours starting 1 hour after IV heparin drip has stopped.  Continue monitoring CBC daily due to ITP.  Monitor for signs and symptoms of bleeding.  Discuss options for oral therapy.   Sloan Leiter, PharmD, BCPS Clinical Pharmacist [phone] 03/26/2015,10:54  AM

## 2015-03-26 NOTE — Plan of Care (Signed)
Problem: Phase I Progression Outcomes Goal: OOB as tolerated unless otherwise ordered Outcome: Not Applicable Date Met:  63/01/60 Pt strict bedrest

## 2015-03-26 NOTE — Progress Notes (Signed)
Pt transferred to 58W01 with husband. Pt with no complaints of pain, VS WNL, AAOx4, GCS 15. Pt safely arrived on unit with receiving rn at bedside.

## 2015-03-26 NOTE — Progress Notes (Signed)
[facility] ADULT ICU REPLACEMENT PROTOCOL FOR AM LAB REPLACEMENT ONLY  The patient does not apply for the [facility] Adult ICU Electrolyte Replacment Protocol based on the criteria listed below:   1. Is GFR >/= 40 ml/min? Yes.    Patient's GFR today is >60 2. Is urine output >/= 0.5 ml/kg/hr for the last 6 hours? No. Patient's UOP is 0.3 ml/kg/hr 3. Is BUN < 60 mg/dL? Yes.    Patient's BUN today is 10 4. Abnormal electrolyte(s K+3.4 5. Ordered repletion with: NA 6. If a panic level lab has been reported, has the CCM MD in charge been notified? No..   Physician:  Hulan Amato [facility] 03/26/2015 6:02 AM

## 2015-03-26 NOTE — Progress Notes (Signed)
ANTICOAGULATION CONSULT NOTE - Follow Up Consult  Pharmacy Consult for heparin Indication: pulmonary embolus   Labs:  Recent Labs  03/24/15 1614 03/24/15 2330 03/25/15 0220 03/25/15 1010 03/25/15 1700 03/26/15 0140  HGB 12.2 11.9* 11.5*  --  12.4 11.5*  HCT 36.4 34.5* 33.5*  --  36.2 33.3*  PLT 21* 29* 31*  --  29* PENDING  HEPARINUNFRC  --   --   --   --  0.24* 0.51  CREATININE 1.05* 0.94 0.87  --   --   --   TROPONINI 0.16*  --   --  0.07*  --   --      Assessment/Plan:  54yo female therapeutic on heparin after rate increase. Will continue gtt at current rate and confirm stable with additional level.   Wynona Neat, PharmD, BCPS  03/26/2015,3:12 AM

## 2015-03-26 NOTE — Progress Notes (Signed)
Janice Martin   DOB:[birth year]   LD#:[id number]   JQZ#:[id number]  Patient Care Team: Rowe Clack, MD as PCP - General [name], MD (Hematology and Oncology) Alden Hipp, MD (Obstetrics and Gynecology)  Subjective: Events since 6/5 noted. She is feeling better this morning. Denies fevers, chills, night sweats, vision changes, or mucositis. Denies any respiratory complaints. Denies any chest pain or palpitations. Her lower extremity swelling is improving. Denies leg pain. Denies nausea, heartburn or change in bowel habits. Appetite is normal. Denies any dysuria. Denies abnormal skin rashes, or neuropathy. Denies any bleeding issues such as epistaxis, hematemesis, hematuria or hematochezia.   Scheduled Meds: . dexamethasone  40 mg Oral Daily  . irbesartan  300 mg Oral Daily   And  . hydrochlorothiazide  25 mg Oral Daily  . insulin aspart  1-3 Units Subcutaneous 6 times per day  . levofloxacin  500 mg Oral Daily  . vitamin B-12  250 mcg Oral Daily   Continuous Infusions: . heparin 1,400 Units/hr (03/25/15 1940)   PRN Meds:.sodium chloride, metoprolol  Objective:  Filed Vitals:   03/26/15 0749  BP:   Pulse:   Temp: 98.7 F (37.1 C)  Resp:       Intake/Output Summary (Last 24 hours) at 03/26/15 0847 Last data filed at 03/26/15 0749  Gross per 24 hour  Intake 2159.38 ml  Output   2000 ml  Net 159.38 ml    GENERAL: alert, no distress and comfortable SKIN: skin color, texture, turgor are normal, no rashes or significant lesions. Hirsutism noted. EYES: normal, conjunctiva are pink and non-injected, sclera clear OROPHARYNX:no exudate, no erythema and lips, buccal mucosa, and tongue normal  NECK: supple, thyroid normal size, non-tender, without nodularity LYMPH:  no palpable lymphadenopathy in the cervical, axillary or inguinal LUNGS: clear to auscultation and percussion with normal breathing effort HEART: regular rate & rhythm and no murmurs and improving  bilateral lower extremity edema, R>L ABDOMEN: obese, non-tender and normal bowel sounds Musculoskeletal:no cyanosis of digits and no clubbing  PSYCH: alert & oriented x 3 with fluent speech NEURO: no focal motor/sensory deficits    CBG (last 3)   Recent Labs  03/26/15 0006 03/26/15 0408 03/26/15 0744  GLUCAP 146* 156* 145*     Labs:   Recent Labs Lab 03/24/15 1614 03/24/15 2330 03/25/15 0220 03/25/15 1700 03/26/15 0140  WBC 20.5* 20.7* 20.6* 26.3* 33.9*  HGB 12.2 11.9* 11.5* 12.4 11.5*  HCT 36.4 34.5* 33.5* 36.2 33.3*  PLT 21* 29* 31* 29* 53*  MCV 93.1 90.6 91.0 89.8 90.0  MCH 31.2 31.2 31.3 30.8 31.1  MCHC 33.5 34.5 34.3 34.3 34.5  RDW 13.8 14.0 14.1 13.9 13.9  LYMPHSABS 4.7*  --   --   --   --   MONOABS 0.8  --   --   --   --   EOSABS 0.4  --   --   --   --   BASOSABS 0.0  --   --   --   --      Chemistries:    Recent Labs Lab 03/24/15 1614 03/24/15 2330 03/25/15 0220 03/26/15 0140  NA 139 138 139 139  K 3.1* 3.1* 3.4* 3.4*  CL 103 102 104 105  CO2 28 25 26 25   GLUCOSE 104* 136* 117* 167*  BUN 7 <5* 6 10  CREATININE 1.05* 0.94 0.87 0.83  CALCIUM 8.5* 8.6* 8.6* 8.4*  MG  --  1.7  --  2.2  AST  19 21  --   --   ALT 35 33  --   --   ALKPHOS 83 80  --   --   BILITOT 0.2* 0.5  --   --     GFR Estimated Creatinine Clearance: 84.6 mL/min (by C-G formula based on Cr of 0.83).  Liver Function Tests:  Recent Labs Lab 03/24/15 1614 03/24/15 2330  AST 19 21  ALT 35 33  ALKPHOS 83 80  BILITOT 0.2* 0.5  PROT 6.8 6.4*  ALBUMIN 3.5 3.1*   No results for input(s): LIPASE, AMYLASE in the last 168 hours. No results for input(s): AMMONIA in the last 168 hours.  Urine Studies     Component Value Date/Time   COLORURINE AMBER* 03/25/2015 0444   APPEARANCEUR CLEAR 03/25/2015 0444   LABSPEC 1.010 03/25/2015 0444   LABSPEC 1.015 01/21/2012 1132   PHURINE 6.0 03/25/2015 0444   PHURINE 6.5 01/21/2012 [place] 03/25/2015 0444    GLUCOSEU NEGATIVE 02/18/2013 0855   HGBUR NEGATIVE 03/25/2015 0444   HGBUR Large 01/21/2012 1132   BILIRUBINUR NEGATIVE 03/25/2015 0444   BILIRUBINUR Negative 01/21/2012 1132   KETONESUR 15* 03/25/2015 0444   KETONESUR Negative 01/21/2012 1132   PROTEINUR 30* 03/25/2015 0444   PROTEINUR < 30 01/21/2012 1132   UROBILINOGEN 0.2 03/25/2015 0444   NITRITE NEGATIVE 03/25/2015 0444   NITRITE Negative 01/21/2012 1132   LEUKOCYTESUR NEGATIVE 03/25/2015 0444   LEUKOCYTESUR Small 01/21/2012 1132    Coagulation profile No results for input(s): INR, PROTIME in the last 168 hours.  Cardiac Enzymes:  Recent Labs Lab 03/24/15 1614 03/25/15 1010 03/26/15 0140  TROPONINI 0.16* 0.07* 0.03   BNP: Invalid input(s): POCBNP CBG:  Recent Labs Lab 03/25/15 1557 03/25/15 2002 03/26/15 0006 03/26/15 0408 03/26/15 0744  GLUCAP 145* 187* 146* 156* 145*   D-Dimer  Recent Labs  03/24/15 1640  DDIMER 5.98*     Imaging Studies:  Dg Chest 2 View  03/24/2015   CLINICAL DATA:  Upper respiratory infection.  Cough and congestion.  EXAM: CHEST - 2 VIEW  COMPARISON:  None.  FINDINGS: Heart size is normal. The lung volumes are somewhat low. No focal airspace disease is present. Surgical clips are present at the gallbladder fossa and near the left hemidiaphragm.  IMPRESSION: No acute cardiopulmonary disease.   Electronically Signed   By: San Morelle M.D.   On: 03/24/2015 15:24   Ct Angio Chest Pe W/cm &/or Wo Cm  03/24/2015   CLINICAL DATA:  Acute onset of cough, congestion and productive cough. Shortness of breath. Elevated D-dimer. Initial encounter.  EXAM: CT ANGIOGRAPHY CHEST WITH CONTRAST  TECHNIQUE: Multidetector CT imaging of the chest was performed using the standard protocol during bolus administration of intravenous contrast. Multiplanar CT image reconstructions and MIPs were obtained to evaluate the vascular anatomy.  CONTRAST:  168mL OMNIPAQUE IOHEXOL 350 MG/ML SOLN  COMPARISON:   Chest radiograph performed earlier today at 2:51 p.m.  FINDINGS: A saddle pulmonary embolus is noted, with pulmonary embolus extending into all lobes of both lungs. Large clot burden is seen. The RV/LV ratio of 1.64 is consistent with right heart strain and at least submassive pulmonary embolus.  Mild left basilar airspace opacity may reflect a small pulmonary infarct. There is minimal nonspecific ground-glass opacity at the left lung apex, measuring 0.7 cm in size. There is no evidence of pleural effusion or pneumothorax. No abnormal focal contrast enhancement is seen.  Visualized mediastinal nodes are normal in size. No  mediastinal lymphadenopathy is seen. No pericardial effusion is identified. The great vessels are grossly unremarkable in appearance. No axillary lymphadenopathy is seen. The thyroid gland is unremarkable in appearance.  The visualized portions of the liver are unremarkable. The patient is status post splenectomy. The patient is status post cholecystectomy, with clips noted at the gallbladder fossa. The visualized portions of the pancreas, adrenal glands and left kidney are within normal limits.  No acute osseous abnormalities are seen.  Review of the MIP images confirms the above findings.  IMPRESSION: 1. Large saddle pulmonary embolus noted, with pulmonary embolus extending into all lobes of both lungs. CT evidence of right heart strain (RV/LV Ratio = 1.64) consistent with at least submassive (intermediate risk) PE. The presence of right heart strain has been associated with an increased risk of morbidity and mortality. Please activate Code PE by paging [phone]. 2. Mild left basilar airspace opacity may reflect a small pulmonary infarct. 3. Minimal nonspecific ground-glass opacity at the left lung apex, measuring 0.7 cm in size. Would perform initial follow-up CT of the chest at 3 months, to ensure persistence, as per Fleischner Society recommendations.  Critical Value/emergent results were  called by telephone at the time of interpretation on 03/24/2015 at 6:12 pm to Dr. Veryl Speak, who verbally acknowledged these results.   Electronically Signed   By: Garald Balding M.D.   On: 03/24/2015 18:22    Assessment/Plan: 54 y.o.  Recurrent ITP, likely triggered by infection Significant leukocytosis, likely reactive in nature, source of infection indeterminate, likely upper respiratory tract  Severe thrombocytopenia in the setting of acute pulmonary emboli  Continue high-dose dexamethasone along with empiric antibiotic coverage. Levofloxacin is a good choice; avoid agents such as cefepime or vancomycin due to significant toxicity to platelets with those agents.  Due to acute issue needing anticoagulation therapy, agree with platelet transfusion to minimize risk of bleeding. Her platelet count today is improved, at 53,000 (from 29k) Continue very close monitoring of her platelet counts. The goal would be to try to keep her platelet count greater than 50,000 if possible. Would not advocate the use of IVIG, Promacta or growth factor injections such as Nplate to bring her platelet count due to risks of thrombosis.  Acute saddle emboli History of right lower extremity blood clot Severe thrombocytopenia This is a challenging case. The patient has developed heart strain from the severe saddle emboli. Doppler ultrasound on 6/5 confirmed Right DVT noted in the FV, popliteal vein, gastrocnemius vein, and peroneal vein. The patient may benefit consideration for IVC filter placement if she does not improve with anticoagulation therapy or if she developed bleeding complication. Of note, patient expresses concern about filter as a choice, and wishes to discuss it further with dr. Marin Olp. Continue IV heparin along with platelet transfusions to support her platelet count. The patient will likely need long-term anticoagulation therapy due to recurrent thrombotic episodes.  Leukocytosis In the  setting of IV steroids and  reactive inflammation Will monitor   Full Code  Other medical issues as per admitting team     Butte des Morts, PA-C 03/26/2015  8:47 AM   ADDENDUM:  I saw and examined the patient. I'm just surprised about the DVT/ PE. She has had a DVT in the past. I might sure as to what might be the precipitating factor.  We did hypercoagulable studies on her several years ago. She had a minimally depressed protein C. I probably will have to recheck this again.  I think that it  will be easiest for her to have her on Xarelto. I think Xarelto would be safe. Her thrombocytopenia should respond to Decadron.  Xarelto works via whole different mechanism then thrombocytopenia. As such, I think there would be little risk for bleeding.  I talked to her about this. She currently is on Lovenox. We could switch her over to Xarelto.  I can't think of any other studies that we have to do. I don't think she needs any kind of malignancy workup.  Her blood counts are not back yet. She will continue the Decadron for 4 days. Again, this has always worked for her. It is possible that some viral infection could've triggered the thrombocytopenia exacerbation.  I appreciate all the great care that she is getting up on 5 W.  [name] E.  Rodman Key 11:28-30  We will follow along closely area did

## 2015-03-27 DIAGNOSIS — D72829 Elevated white blood cell count, unspecified: Secondary | ICD-10-CM

## 2015-03-27 DIAGNOSIS — D473 Essential (hemorrhagic) thrombocythemia: Secondary | ICD-10-CM

## 2015-03-27 DIAGNOSIS — I82431 Acute embolism and thrombosis of right popliteal vein: Secondary | ICD-10-CM

## 2015-03-27 DIAGNOSIS — I82491 Acute embolism and thrombosis of other specified deep vein of right lower extremity: Secondary | ICD-10-CM

## 2015-03-27 LAB — GLUCOSE, CAPILLARY
GLUCOSE-CAPILLARY: 119 mg/dL — AB (ref 65–99)
GLUCOSE-CAPILLARY: 168 mg/dL — AB (ref 65–99)
Glucose-Capillary: 116 mg/dL — ABNORMAL HIGH (ref 65–99)
Glucose-Capillary: 134 mg/dL — ABNORMAL HIGH (ref 65–99)
Glucose-Capillary: 154 mg/dL — ABNORMAL HIGH (ref 65–99)
Glucose-Capillary: 163 mg/dL — ABNORMAL HIGH (ref 65–99)

## 2015-03-27 LAB — CBC WITH DIFFERENTIAL/PLATELET
BASOS ABS: 0 10*3/uL (ref 0.0–0.1)
BASOS PCT: 0 % (ref 0–1)
EOS ABS: 0 10*3/uL (ref 0.0–0.7)
Eosinophils Relative: 0 % (ref 0–5)
HEMATOCRIT: 35.6 % — AB (ref 36.0–46.0)
Hemoglobin: 12.3 g/dL (ref 12.0–15.0)
LYMPHS PCT: 5 % — AB (ref 12–46)
Lymphs Abs: 2 10*3/uL (ref 0.7–4.0)
MCH: 31.1 pg (ref 26.0–34.0)
MCHC: 34.6 g/dL (ref 30.0–36.0)
MCV: 89.9 fL (ref 78.0–100.0)
MONOS PCT: 4 % (ref 3–12)
Monocytes Absolute: 1.6 10*3/uL — ABNORMAL HIGH (ref 0.1–1.0)
Neutro Abs: 35.7 10*3/uL — ABNORMAL HIGH (ref 1.7–7.7)
Neutrophils Relative %: 91 % — ABNORMAL HIGH (ref 43–77)
Platelets: 102 10*3/uL — ABNORMAL LOW (ref 150–400)
RBC: 3.96 MIL/uL (ref 3.87–5.11)
RDW: 14.5 % (ref 11.5–15.5)
WBC: 39.3 10*3/uL — ABNORMAL HIGH (ref 4.0–10.5)

## 2015-03-27 LAB — BASIC METABOLIC PANEL
ANION GAP: 11 (ref 5–15)
BUN: 21 mg/dL — AB (ref 6–20)
CALCIUM: 9 mg/dL (ref 8.9–10.3)
CO2: 24 mmol/L (ref 22–32)
Chloride: 106 mmol/L (ref 101–111)
Creatinine, Ser: 1.03 mg/dL — ABNORMAL HIGH (ref 0.44–1.00)
GFR calc Af Amer: >60 mL/min (ref >60)
GFR calc non Af Amer: >60 mL/min (ref >60)
Glucose, Bld: 134 mg/dL — ABNORMAL HIGH (ref 65–99)
POTASSIUM: 4 mmol/L (ref 3.5–5.1)
Sodium: 141 mmol/L (ref 135–145)

## 2015-03-27 LAB — PHOSPHORUS: Phosphorus: 4.2 mg/dL (ref 2.5–4.6)

## 2015-03-27 LAB — MAGNESIUM: MAGNESIUM: 2.3 mg/dL (ref 1.7–2.4)

## 2015-03-27 MED ORDER — RIVAROXABAN 15 MG PO TABS
15.0000 mg | ORAL_TABLET | Freq: Two times a day (BID) | ORAL | Status: DC
  Administered 2015-03-27 – 2015-03-28 (×3): 15 mg via ORAL
  Filled 2015-03-27 (×5): qty 1

## 2015-03-27 NOTE — Progress Notes (Signed)
PULMONARY / CRITICAL CARE MEDICINE HISTORY AND PHYSICAL EXAMINATION   Name: Janice Martin MRN: [id number] DOB: [birth year]    ADMISSION DATE:  03/24/2015  PRIMARY SERVICE: PCCM  CHIEF COMPLAINT:  PE  BRIEF PATIENT DESCRIPTION:  Janice Martin is a 61 F with ITP who presented to [facility] for persistent SOB following a recent URI and was found to have a PE. She was transferred to [facility] for further evaluation. She has no SOB at rest and is currently comfortable.   Chart review and patient hx: shows Chronic Relapsing ITP / sp./ RItuxan ending Oct 2014. Most recent OV with Dr Arelia Sneddon 03/09/15 - platelent count 255K (baseline 2016 is 56 t0 360 ; looks like she Rx herself with prn decadron). Patient and husband report: remote Iv IG Rx + without adverse effects. REmote platelet tx as well without side effect. Patient reports high bleeding risk when platelet count < 5K for her.   There has been 1 bleeding episode that was lif threatening at plat count 3K Otherwise, no bleeding episodes historically. Also, reports that even ongoing intermittently platelets drop to 50s usulally due to viral infection and Rx with Decadron prn which helps in a week or so. Most recently in spring 2016  LINES / TUBES: PIV  CULTURES: None  ANTIBIOTICS: Levaquin 2/7   SIGNIFICANT EVENTS / STUDIES:  PLT count 21 CT-PE - Submassive PE 6/5 duplex >>Right: DVT noted in the FV, popliteal v, gastrocnemius v, and peroneal v. 6/6 started on Xarelto  SUBJECTIVE:  Afebrile breathing ok   No chest pain   VITAL SIGNS: Temp:  [97.4 F (36.3 C)-98 F (36.7 C)] 97.6 F (36.4 C) (06/07 0800) Pulse Rate:  [85-117] 85 (06/07 0800) Resp:  [16-25] 17 (06/07 0800) BP: (147-171)/(71-137) 170/86 mmHg (06/07 0800) SpO2:  [95 %-98 %] 97 % (06/07 0800) Weight:  [192 lb 14.4 oz (87.5 kg)] 192 lb 14.4 oz (87.5 kg) (06/07 0520)         INTAKE / OUTPUT: Intake/Output      06/06 0701 - 06/07 0700 06/07 0701 - 06/08 0700   P.O.  150 60   I.V. (mL/kg) 48 (0.5)    Blood     IV Piggyback     Total Intake(mL/kg) 198 (2.3) 60 (0.7)   Urine (mL/kg/hr) 800 (0.4)    Total Output 800     Net -602 +60        Urine Occurrence 5 x      PHYSICAL EXAMINATION: General:  NAD Neuro:  Intact HEENT:  Sclera anicteric, conjunctiva pink, MMM, OP clear Neck: Trachea supple and midline, (-) LAN or JVD Cardiovascular:  RRR, nS1/S2, (-) MRG. NSR HR 79 Lungs:  CTAB  Abdomen:  S/NT/ND/(+)BS Musculoskeletal:  RT CALF WARM + Skin:  Intact Gen: no overt bleeding  LABS: PULMONARY No results for input(s): PHART, PCO2ART, PO2ART, HCO3, TCO2, O2SAT in the last 168 hours.  Invalid input(s): PCO2, PO2  CBC  Recent Labs Lab 03/25/15 1700 03/26/15 0140 03/27/15 0547  HGB 12.4 11.5* 12.3  HCT 36.2 33.3* 35.6*  WBC 26.3* 33.9* 39.3*  PLT 29* 53* 102*    COAGULATION No results for input(s): INR in the last 168 hours.  CARDIAC    Recent Labs Lab 03/24/15 1614 03/25/15 1010 03/26/15 0140  TROPONINI 0.16* 0.07* 0.03   No results for input(s): PROBNP in the last 168 hours.   CHEMISTRY  Recent Labs Lab 03/24/15 1614 03/24/15 2330 03/25/15 0220 03/26/15 0140 03/27/15 0547  NA  139 138 139 139 141  K 3.1* 3.1* 3.4* 3.4* 4.0  CL 103 102 104 105 106  CO2 28 25 26 25 24   GLUCOSE 104* 136* 117* 167* 134*  BUN 7 <5* 6 10 21*  CREATININE 1.05* 0.94 0.87 0.83 1.03*  CALCIUM 8.5* 8.6* 8.6* 8.4* 9.0  MG  --  1.7  --  2.2 2.3  PHOS  --   --   --  3.8 4.2   Estimated Creatinine Clearance: 68.4 mL/min (by C-G formula based on Cr of 1.03).   LIVER  Recent Labs Lab 03/24/15 1614 03/24/15 2330  AST 19 21  ALT 35 33  ALKPHOS 83 80  BILITOT 0.2* 0.5  PROT 6.8 6.4*  ALBUMIN 3.5 3.1*     INFECTIOUS  Recent Labs Lab 03/25/15 1010 03/26/15 0140  LATICACIDVEN 1.2  --   PROCALCITON 0.11 <0.10     ENDOCRINE CBG (last 3)   Recent Labs  03/27/15 0028 03/27/15 0445 03/27/15 0809  GLUCAP 154* 134* 116*          IMAGING x48h  - image(s) personally visualized  -   highlighted in bold No results found.       ASSESSMENT / PLAN:  Active Problems:   Saddle embolism of pulmonary artery   Saddle embolus of pulmonary artery   Hypokalemia   Severe thrombocytopenia   ITP (idiopathic thrombocytopenic purpura)   PULMONARY A: Acute Submassive PE with LUL GGO likely due to PE - PESI- Class 3 Intermediate Risk for 30d mortality (tachycardic, tachypneic, submassive, trop leak)  -  Course complicaed by thrombocytopenia (plat count 30K)  due to ITP flare V Consumption - she feels ITP flare that is typical for her   : P:    Change to Xarelto 6/6 per pharmacy Avoid IVC filter unless bleeding  Follow-up CT in 3 months to confirm persistence of GGO, then yearly x 3 years  CARDIOVASCULAR A: HTN:  P:   Cont OP ARB/HCTZ Hold BBs for now  RENAL  Recent Labs Lab 03/25/15 0220 03/26/15 0140 03/27/15 0547  K 3.4* 3.4* 4.0     A: Hypokalemia: repleted Hypomagnesemia - mild P:   Repelte  GASTROINTESTINAL A: No acute issues P Soft solids  HEMATOLOGIC A: #baseline Chronic RElapsing ITP Hx - Dr Marin Olp  Chronic Relapsing ITP / sp./ RItuxan ending Oct 2014. Most recent OV with Dr Arelia Sneddon 03/09/15 - platelent count 255K (baseline 2016 is 56 t0 360 ; looks like she Rx herself with prn decadron). Patient and husband report: remote Iv IG Rx + without adverse effects. REmote platelet tx as well without side effect. Patient reports high bleeding risk when platelet count < 5K for her.   There has been 1 bleeding episode that was lif threatening at plat count 3K Otherwise, no bleeding episodes historically. Also, reports that even ongoing intermittently platelets drop to 50s usulally due to viral infection and Rx with Decadron prn which helps in a week or so. Most recently in spring 2016    #Currently  - Thrombocytopenia -improved 6/7  P:   Monitor Platelet transfusion while  getting IV hepari - goal > 50K, now >100K High dose decardon x 4 days starting 03/25/15 for possible ITP flare Avoid Iv IG - risk for antibodies and 10% risk for DVT/PE Follow up with [facility] as Opt.  INFECTIOUS A: Low grade fever +  - likely due to PE   P Empiric levaquin x 2/7ds for sinusitis  ENDOCRINE CBG (last 3)  Recent Labs  03/27/15 0028 03/27/15 0445 03/27/15 0809  GLUCAP 154* 134* 116*     A: Elevated blood glucose: On decadron P:   Check A1c SSI  NEUROLOGIC A: Anxiety:  P:   Monitor    TODAY'S SUMMARY:  Submassive PE, High risk deteriopration without Rx. High risk bleeding with Rx. Now on Xarelto. Plts  >100K. On high dose decadron. ? DC home today.  Richardson Landry Loxley Cibrian ACNP Maryanna Shape PCCM Pager [phone] till 3 pm If no answer page [phone] 03/27/2015, 9:50 AM

## 2015-03-27 NOTE — Discharge Instructions (Signed)
Information on my medicine - XARELTO (rivaroxaban)  This medication education was reviewed with me or my healthcare representative as part of my discharge preparation.    WHY WAS XARELTO PRESCRIBED FOR YOU? Xarelto was prescribed to treat blood clots that may have been found in the veins of your legs (deep vein thrombosis) or in your lungs (pulmonary embolism) and to reduce the risk of them occurring again.  What do you need to know about Xarelto? The starting dose is one 15 mg tablet taken TWICE daily with food for the FIRST 21 DAYS then on 04/17/15  the dose is changed to one 20 mg tablet taken ONCE A DAY with your evening meal.  DO NOT stop taking Xarelto without talking to the health care provider who prescribed the medication.  Refill your prescription for 20 mg tablets before you run out.  After discharge, you should have regular check-up appointments with your healthcare provider that is prescribing your Xarelto.  In the future your dose may need to be changed if your kidney function changes by a significant amount.  What do you do if you miss a dose? If you are taking Xarelto TWICE DAILY and you miss a dose, take it as soon as you remember. You may take two 15 mg tablets (total 30 mg) at the same time then resume your regularly scheduled 15 mg twice daily the next day.  If you are taking Xarelto ONCE DAILY and you miss a dose, take it as soon as you remember on the same day then continue your regularly scheduled once daily regimen the next day. Do not take two doses of Xarelto at the same time.   Important Safety Information Xarelto is a blood thinner medicine that can cause bleeding. You should call your healthcare provider right away if you experience any of the following: ? Bleeding from an injury or your nose that does not stop. ? Unusual colored urine (red or dark brown) or unusual colored stools (red or black). ? Unusual bruising for unknown reasons. ? A serious fall or  if you hit your head (even if there is no bleeding).  Some medicines may interact with Xarelto and might increase your risk of bleeding while on Xarelto. To help avoid this, consult your healthcare provider or pharmacist prior to using any new prescription or non-prescription medications, including herbals, vitamins, non-steroidal anti-inflammatory drugs (NSAIDs) and supplements.  This website has more information on Xarelto: [email or web address]

## 2015-03-28 ENCOUNTER — Other Ambulatory Visit: Payer: Self-pay | Admitting: Hematology & Oncology

## 2015-03-28 ENCOUNTER — Telehealth: Payer: Self-pay | Admitting: Hematology & Oncology

## 2015-03-28 ENCOUNTER — Encounter: Payer: Self-pay | Admitting: Physician Assistant

## 2015-03-28 DIAGNOSIS — I2699 Other pulmonary embolism without acute cor pulmonale: Secondary | ICD-10-CM

## 2015-03-28 DIAGNOSIS — D693 Immune thrombocytopenic purpura: Secondary | ICD-10-CM

## 2015-03-28 LAB — BASIC METABOLIC PANEL
Anion gap: 11 (ref 5–15)
BUN: 21 mg/dL — ABNORMAL HIGH (ref 6–20)
CHLORIDE: 101 mmol/L (ref 101–111)
CO2: 25 mmol/L (ref 22–32)
Calcium: 9.1 mg/dL (ref 8.9–10.3)
Creatinine, Ser: 0.93 mg/dL (ref 0.44–1.00)
GFR calc Af Amer: >60 mL/min (ref >60)
GFR calc non Af Amer: >60 mL/min (ref >60)
Glucose, Bld: 123 mg/dL — ABNORMAL HIGH (ref 65–99)
Potassium: 4.1 mmol/L (ref 3.5–5.1)
Sodium: 137 mmol/L (ref 135–145)

## 2015-03-28 LAB — CBC WITH DIFFERENTIAL/PLATELET
Basophils Absolute: 0 10*3/uL (ref 0.0–0.1)
Basophils Relative: 0 % (ref 0–1)
EOS ABS: 0 10*3/uL (ref 0.0–0.7)
EOS PCT: 0 % (ref 0–5)
HCT: 41 % (ref 36.0–46.0)
Hemoglobin: 14.2 g/dL (ref 12.0–15.0)
LYMPHS ABS: 2.8 10*3/uL (ref 0.7–4.0)
Lymphocytes Relative: 8 % — ABNORMAL LOW (ref 12–46)
MCH: 31.2 pg (ref 26.0–34.0)
MCHC: 34.6 g/dL (ref 30.0–36.0)
MCV: 90.1 fL (ref 78.0–100.0)
MONOS PCT: 4 % (ref 3–12)
Monocytes Absolute: 1.4 10*3/uL — ABNORMAL HIGH (ref 0.1–1.0)
NEUTROS ABS: 31.3 10*3/uL — AB (ref 1.7–7.7)
NEUTROS PCT: 88 % — AB (ref 43–77)
Platelets: 136 10*3/uL — ABNORMAL LOW (ref 150–400)
RBC: 4.55 MIL/uL (ref 3.87–5.11)
RDW: 14.2 % (ref 11.5–15.5)
WBC: 35.5 10*3/uL — AB (ref 4.0–10.5)

## 2015-03-28 LAB — GLUCOSE, CAPILLARY
GLUCOSE-CAPILLARY: 139 mg/dL — AB (ref 65–99)
Glucose-Capillary: 130 mg/dL — ABNORMAL HIGH (ref 65–99)
Glucose-Capillary: 120 mg/dL — ABNORMAL HIGH (ref 65–99)
Glucose-Capillary: 94 mg/dL (ref 65–99)

## 2015-03-28 LAB — PHOSPHORUS: Phosphorus: 4.6 mg/dL (ref 2.5–4.6)

## 2015-03-28 LAB — MAGNESIUM: MAGNESIUM: 2.4 mg/dL (ref 1.7–2.4)

## 2015-03-28 MED ORDER — RIVAROXABAN 15 MG PO TABS: 15.0000 mg | ORAL_TABLET | Freq: Two times a day (BID) | ORAL | Status: DC

## 2015-03-28 MED ORDER — PANTOPRAZOLE SODIUM 40 MG PO TBEC: 40.0000 mg | DELAYED_RELEASE_TABLET | Freq: Every day | ORAL | Status: DC

## 2015-03-28 MED ORDER — LEVOFLOXACIN 500 MG PO TABS: 500.0000 mg | ORAL_TABLET | Freq: Every day | ORAL | Status: DC

## 2015-03-28 MED ORDER — RIVAROXABAN 20 MG PO TABS: 20.0000 mg | ORAL_TABLET | Freq: Every day | ORAL | Status: DC

## 2015-03-28 MED ORDER — PANTOPRAZOLE SODIUM 40 MG PO TBEC
40.0000 mg | DELAYED_RELEASE_TABLET | Freq: Every day | ORAL | Status: DC
  Administered 2015-03-28: 40 mg via ORAL
  Filled 2015-03-28: qty 1

## 2015-03-28 MED ORDER — DEXAMETHASONE 4 MG PO TABS: 4.0000 mg | ORAL_TABLET | ORAL | Status: DC

## 2015-03-28 NOTE — Progress Notes (Signed)
Pt discharged to hom with husband. Education and discharge instructions reviewed and all questions addressed. Vitals stable, telemetry and IV DC'd.  03/28/2015 2:15 PM Janice Martin

## 2015-03-28 NOTE — Discharge Summary (Signed)
Physician Discharge Summary  Patient ID: Janice Martin MRN: [id number] DOB/AGE: 1961-07-23 54 y.o.  Admit date: 03/24/2015 Discharge date: 03/28/2015    Discharge Diagnoses:  Acute Pulmonary Embolism - PESI class 3 LUL GGO's - likely due to acute PE Hypertension GI prophylaxis Hx chronic relapsing ITP - with possible acute flare this admission (followed by Dr. Marin Olp) Thrombocytopenia - consumption vs possible ITP flare (now improved s/p platelet transfusion + decadron) Sinusitis                                                                      DISCHARGE PLAN BY DIAGNOSIS    Acute Pulmonary Embolism - PESI class 3 LUL GGO's - likely due to acute PE Plan: Continue xarelto (74m BID through 04/16/15 for total of 21 days then switch to 243mQD). Will require lifelong anticoagulation. Follow up as outpatient with Dr. RaChase Caller appointment has been made for 05/15/15 at 4:00PM. Follow up CT in 3 months (will be scheduled by Dr. RaChase Callert outpatient follow up).  Hypertension Plan: Continue outpatient nebivolol, valsartan - hydrochlorothiazide.  GI prophylaxis Plan: Continue daily PPI.  Hx chronic relapsing ITP - with possible acute flare this admission (followed by Dr. EnMarin OlpThrombocytopenia - consumption vs possible ITP flare (now improved s/p platelet transfusion + decadron) Plan: Continue decadron taper (2084mD x 2 days, then 16m74m x 2 days, then 4mg 3mx 1 day). Follow up with Dr. EnnevMarin Olpinforms me that she will schedule her appointment for next week).  Sinusitis Plan: Continue Levaquin through 03/31/15 (total of 7 days).                DISCHARGE SUMMARY   Janice Martin 54 y.54 y/o female with a PMH of ITP, anemia, anxiety, HTN [facility].  She was found to have an acute bilateral PE (PESI class 3) [facility].  Risks / Benefits  of treatment options were discussed at length with pt and her husband, as well [facility]  Due to risk of her PE worsening / increasing clot burden, she was started on IV heparin.  To balance her risk of bleeding while on heparin infusion, she was also given a platelet transfusion.  Due to the possibility of an ITP flare, she was also started on high dose decadron.  She was evaluated by Dr. EnnevMarin Olp follows her for her ITP) who recommended continuing high dose decadron along with platelet transfusion (along with decadron taper upon discharge).  On 6/7, she was started on Xarelto PO.  She is to continue xarelto for the remainder of her life given that she has had a hx of VTE.  She remained stable; however, requested to stay one more night to ensure she had no bleeding issues after starting xarelto.  On 6/8, she was deemed medically stable and cleared for discharge home.  She is to follow up with Dr. EnnevMarin Olphe next week (she informs me that she will make her own appointment).  She also has follow up with Dr. RamasChase Callerduled for July  26th at 4:00 PM.  She has been given prescriptions for decadron taper as well as xarelto (64m BID through 04/16/15 for total of 21 days then switch to 222mQD).   She will need a prescription for a refill of her 2034marelto at her next appointment (either with PCP or Dr. EnnMarin Olpxt week).  The importance of ensuring that she gets a refill prescription was stressed multiple times and Janice Martin verbalized understanding.             SIGNIFICANT DIAGNOSTIC STUDIES 6/4 CTA chest >>> large saddle PE with RV / LV ratio of 1.64.  Mild left basilar opacity may reflect pulmonary infarct.  Nonspecific GGO at left lung apex. 6/5 LE Duplex >>> DVT noted in right femoral, popliteal, gastrocnemius, and peroneal veins.  SIGNIFICANT EVENTS 6/4 - admit 6/8 - discharge  MICRO DATA   None  ANTIBIOTICS Levaquin 6/5 >>> stop date of 6/11.  CONSULTS Hematology  TUBES / LINES None   Discharge Exam: General: Pleasant adult female, resting in bed, in NAD. Neuro: A&O x 3, non-focal.  HEENT: [place]/AT. PERRL, sclerae anicteric. Cardiovascular: RRR, no M/R/G.  Lungs: Respirations even and unlabored.  CTA bilaterally, No W/R/R. Abdomen: BS x 4, soft, NT/ND.  Musculoskeletal: No gross deformities, RLE edema > LLE. Skin: Intact, warm, no rashes.   Filed Vitals:   03/27/15 2143 03/28/15 0500 03/28/15 0512 03/28/15 0649  BP: 159/87  177/86 183/93  Pulse: 81  67 69  Temp: 97.7 F (36.5 C)  97.5 F (36.4 C)   TempSrc: Oral  Oral   Resp: 18  18   Height:      Weight:  86.818 kg (191 lb 6.4 oz)    SpO2: 98%  98% 98%     Discharge Labs  BMET  Recent Labs Lab 03/24/15 2330 03/25/15 0220 03/26/15 0140 03/27/15 0547 03/28/15 0722  NA 138 139 139 141 137  K 3.1* 3.4* 3.4* 4.0 4.1  CL 102 104 105 106 101  CO2 _0 GLUCOSE 136* 117* 167* 134* 123*  BUN <5* 6 10 21* 21*  CREATININE 0.94 0.87 0.83 1.03* 0.93  CALCIUM 8.6* 8.6* 8.4* 9.0 9.1  MG 1.7  --  2.2 2.3 2.4  PHOS  --   --  3.8 4.2 4.6    CBC  Recent Labs Lab 03/26/15 0140 03/27/15 0547 03/28/15 0722  HGB 11.5* 12.3 14.2  HCT 33.3* 35.6* 41.0  WBC 33.9* 39.3* 35.5*  PLT 53* 102* 136*    Anti-Coagulation No results for input(s): INR in the last 168 hours.  Discharge Instructions    Call MD for:  difficulty breathing, headache or visual disturbances    Complete by:  As directed      Call MD for:  extreme fatigue    Complete by:  As directed      Call MD for:  persistant dizziness or light-headedness    Complete by:  As directed      Call MD for:  severe uncontrolled pain    Complete by:  As directed      Call MD for:  temperature >100.4    Complete by:  As directed      Diet - low sodium heart healthy    Complete by:  As directed      Increase activity slowly    Complete  by:  As directed  Follow-up Information    Follow [facility], MD On 05/15/2015.   Specialty:  Pulmonary Disease   Why:  Your appointment is at 4:00 PM.   Contact information:   [place] [address] [phone]          Medication List    TAKE these medications        dexamethasone 4 MG tablet  Commonly known as:  DECADRON  Take as directed.     dexamethasone 4 MG tablet  Commonly known as:  DECADRON  Take 1 tablet (4 mg total) by mouth See admin instructions. Decadron Taper: Please take 5 tablets (26m) daily x 2 days, then 3 tablets (163m daily x 2 days, then 1 tablet (54m59mx 2 days, then stop.     levofloxacin 500 MG tablet  Commonly known as:  LEVAQUIN  Take 1 tablet (500 mg total) by mouth daily.     nebivolol 5 MG tablet  Commonly known as:  BYSTOLIC  Take 1 tablet (5 mg total) by mouth daily.     pantoprazole 40 MG tablet  Commonly known as:  PROTONIX  Take 1 tablet (40 mg total) by mouth daily.     Rivaroxaban 15 MG Tabs tablet  Commonly known as:  XARELTO  Take 1 tablet (15 mg total) by mouth 2 (two) times daily with a meal.     rivaroxaban 20 MG Tabs tablet  Commonly known as:  XARELTO  Take 1 tablet (20 mg total) by mouth daily with supper.     valsartan-hydrochlorothiazide 320-25 MG per tablet  Commonly known as:  DIOVAN-HCT  Take 1 tablet by mouth daily.     VITAMIN B 12 PO  Take by mouth every morning.         Disposition: Home.  Discharged Condition: RhoTEARRA OUKs met maximum benefit of inpatient care and is medically stable and cleared for discharge.  Patient is pending follow up as above.      Time spent on disposition:  Greater than 45 minutes.    [facility] Pgr: [phone]  or [phone]

## 2015-03-28 NOTE — Progress Notes (Signed)
Mr. Couchman looks and feels good this morning. She started the Xarelto. She's had no problems with this.  Her platelet count is now 102,000. She always responds well to Decadron. I guess today might be the last day of 40 mg of Decadron. If she is going to go home today, she needs a Decadron taper. The taper is as follows:    Decadron 20 mg by mouth daily 2 days, then   Decadron 12 mg by mouth daily 2 days, then   Decadron 4 mg by mouth daily 2 days.  She has had no chest wall pain. She's had no cough. Her right leg still little bit swollen.  I think she will need outpatient Protonix. This will help with her stomach and for indigestion which the Decadron can cause.  On her physical exam, her blood pressure is on the high side. It is 183/93. Some of this might be from Decadron. Her lungs are clear. Cardiac exam regular rate and rhythm. Oral exam shows no mucositis. Abdomen is soft. She has the laparotomy scars from her splenectomy. Extremities shows mild nonpitting edema of the right leg. Neurological exam is nonfocal.  Again, it is unusual to see thrombocytopenia in the setting of thrombo-embolic disease. I realize that the fact that she had her splenectomy, this might increase her risk for thromboembolic disease.  Hopefully, she will be able to go home today.  I really don't think her blood sugars are going to be an issue. They never have been an issue before while she has been on high-dose Decadron.  [name] E.  2 Thessalonians 3:3

## 2015-03-28 NOTE — Progress Notes (Signed)
Pt bp 183/93, hr 69. Paged on call MD. Awaiting callback, will continue to monitor and make day RN aware.

## 2015-03-28 NOTE — Progress Notes (Signed)
Patient given Jennye Moccasin 30 day and 1 year Rx cards. Pt verbalized understanding of to use cards, MD at bedside, asked to provide paper RX for first 30 days at discharge.

## 2015-03-28 NOTE — Telephone Encounter (Signed)
FMLA papers completed and faxed to:  [employer] [phone] F: [phone]   Case: [id number]     COPY SCANNED

## 2015-03-31 LAB — CULTURE, BLOOD (ROUTINE X 2)
Culture: NO GROWTH
Culture: NO GROWTH

## 2015-04-04 ENCOUNTER — Ambulatory Visit (HOSPITAL_BASED_OUTPATIENT_CLINIC_OR_DEPARTMENT_OTHER): Payer: 59 | Admitting: Hematology & Oncology

## 2015-04-04 ENCOUNTER — Encounter: Payer: Self-pay | Admitting: Hematology & Oncology

## 2015-04-04 ENCOUNTER — Encounter: Payer: Self-pay | Admitting: *Deleted

## 2015-04-04 ENCOUNTER — Other Ambulatory Visit (HOSPITAL_BASED_OUTPATIENT_CLINIC_OR_DEPARTMENT_OTHER): Payer: 59

## 2015-04-04 VITALS — BP 118/69 | HR 61 | Temp 97.7°F | Resp 14 | Ht 64.0 in | Wt 188.0 lb

## 2015-04-04 DIAGNOSIS — I82401 Acute embolism and thrombosis of unspecified deep veins of right lower extremity: Secondary | ICD-10-CM | POA: Diagnosis not present

## 2015-04-04 DIAGNOSIS — D693 Immune thrombocytopenic purpura: Secondary | ICD-10-CM | POA: Diagnosis not present

## 2015-04-04 DIAGNOSIS — I82409 Acute embolism and thrombosis of unspecified deep veins of unspecified lower extremity: Secondary | ICD-10-CM

## 2015-04-04 DIAGNOSIS — I2699 Other pulmonary embolism without acute cor pulmonale: Secondary | ICD-10-CM | POA: Diagnosis not present

## 2015-04-04 DIAGNOSIS — I825Z9 Chronic embolism and thrombosis of unspecified deep veins of unspecified distal lower extremity: Secondary | ICD-10-CM | POA: Insufficient documentation

## 2015-04-04 HISTORY — DX: Acute embolism and thrombosis of unspecified deep veins of unspecified lower extremity: I82.409

## 2015-04-04 LAB — CBC WITH DIFFERENTIAL (CANCER CENTER ONLY)
BASO#: 0.1 10*3/uL (ref 0.0–0.2)
BASO%: 0.5 % (ref 0.0–2.0)
EOS%: 2.1 % (ref 0.0–7.0)
Eosinophils Absolute: 0.5 10*3/uL (ref 0.0–0.5)
HCT: 40.2 % (ref 34.8–46.6)
HGB: 14.1 g/dL (ref 11.6–15.9)
LYMPH#: 7.4 10*3/uL — ABNORMAL HIGH (ref 0.9–3.3)
LYMPH%: 30.9 % (ref 14.0–48.0)
MCH: 32.3 pg (ref 26.0–34.0)
MCHC: 35.1 g/dL (ref 32.0–36.0)
MCV: 92 fL (ref 81–101)
MONO#: 2.5 10*3/uL — ABNORMAL HIGH (ref 0.1–0.9)
MONO%: 10.4 % (ref 0.0–13.0)
NEUT%: 56.1 % (ref 39.6–80.0)
NEUTROS ABS: 13.5 10*3/uL — AB (ref 1.5–6.5)
RBC: 4.37 10*6/uL (ref 3.70–5.32)
RDW: 15.5 % (ref 11.1–15.7)
WBC: 24 10*3/uL — AB (ref 3.9–10.0)

## 2015-04-04 LAB — TECHNOLOGIST REVIEW CHCC SATELLITE

## 2015-04-04 LAB — BASIC METABOLIC PANEL
BUN: 32 mg/dL — ABNORMAL HIGH (ref 6–23)
CO2: 27 mEq/L (ref 19–32)
Calcium: 8.7 mg/dL (ref 8.4–10.5)
Chloride: 99 mEq/L (ref 96–112)
Creatinine, Ser: 1.12 mg/dL — ABNORMAL HIGH (ref 0.50–1.10)
Glucose, Bld: 70 mg/dL (ref 70–99)
POTASSIUM: 3.7 meq/L (ref 3.5–5.3)
Sodium: 135 mEq/L (ref 135–145)

## 2015-04-04 LAB — CHCC SATELLITE - SMEAR

## 2015-04-04 NOTE — Progress Notes (Signed)
Hematology and Oncology Follow Up Visit  Janice Martin [id number] 07/27/1961 54 y.o. 04/04/2015   Principle Diagnosis:   Chronic immune thrombocytopenia-recurrent  Submassive pulmonary embolism, with right lower extremity thrombus  Current Therapy:    Xarelto 20 mg by mouth daily     Interim History:  [name] is back for followup. Shockingly enough, she was admitted to the hospital couple weeks ago. She had shortness of breath. She was found to have a saddle embolus in the lungs. She also found have a thrombus in the right leg extending from a femoral vein down to the gastrocnemius vein. She was not felt to be a candidate for thrombolytic therapy because she had an exacerbation of her chronic thrombocytopenia. She was started on a heparin drip. She was started on pulsed high-dose Decadron. Her platelet count came up real nicely.  She was started on Xarelto.  She feels a little better. She still has some chest wall pain. There is still some slight shortness of breath. She's not having any hemoptysis.  She is not ready to go back to work yet from my point of view. I think she is to be out another week so we can manage her anticoagulation therapy.  She was on a steroid taper. She is stopped this.  She has had a hypercoagulable workup in the past. No one thing that came back abnormal was a slightly depressed protein C activity level of 61%.  She has been active otherwise. This shortness of breath just seem to come on her. She had a recent viral upper respiratory infection. That probably is what triggered the thrombocytopenia exacerbation.  She is eating okay. She is not having any leg pain or any leg swelling.  Overall, her performance status is ECOG 1.     Overall, her performance status is ECOG 0.  Medications:  Current outpatient prescriptions:  .  Cyanocobalamin (VITAMIN B 12 PO), Take by mouth every morning., Disp: , Rfl:  .  levofloxacin (LEVAQUIN) 500 MG tablet, Take 1  tablet (500 mg total) by mouth daily., Disp: 3 tablet, Rfl: 0 .  nebivolol (BYSTOLIC) 5 MG tablet, Take 1 tablet (5 mg total) by mouth daily. (Patient taking differently: Take 5 mg by mouth daily after supper. ), Disp: 90 tablet, Rfl: 3 .  pantoprazole (PROTONIX) 40 MG tablet, Take 1 tablet (40 mg total) by mouth daily., Disp: 30 tablet, Rfl: 1 .  Rivaroxaban (XARELTO) 15 MG TABS tablet, Take 1 tablet (15 mg total) by mouth 2 (two) times daily with a meal. (Patient taking differently: Take 15 mg by mouth 2 (two) times daily with a meal. STOP DATE 04-16-15), Disp: 41 tablet, Rfl: 0 .  valsartan-hydrochlorothiazide (DIOVAN-HCT) 320-25 MG per tablet, Take 1 tablet by mouth daily., Disp: 90 tablet, Rfl: 3 .  rivaroxaban (XARELTO) 20 MG TABS tablet, Take 1 tablet (20 mg total) by mouth daily with supper. (Patient not taking: Reported on 04/04/2015), Disp: 30 tablet, Rfl: 0  Allergies:  Allergies  Allergen Reactions  . Shellfish Allergy Anaphylaxis  . Influenza Vaccines     Per pt can't take mess up with her immune system  . Morphine Other (See Comments)    Burns really bad    Past Medical History, Surgical history, Social history, and Family History were reviewed and updated.  Review of Systems: As above  Physical Exam:  height is 5\' 4"  (1.626 m) and weight is 188 lb (85.276 kg). Her oral temperature is 97.7 F (36.5 C).  Her blood pressure is 118/69 and her pulse is 61. Her respiration is 14.   Well-developed and well-nourished white female. Head and neck exam shows no ocular or oral lesions. There is no adenopathy of the neck. Lungs are clear bilaterally. She has no rales, wheezes or rhonchi. Cardiac exam is regular rate and rhythm with no murmurs rubs or bruits. Abdomen has a well-healed splenectomy scar. She has no fluid wave. There is no palpable liver edge. Back exam no tenderness over the spine ribs or hips. Extremities no clubbing cyanosis or edema. I cannot palpate any venous cord in the  right leg. She has a negative Homans sign in the right leg. She has good pulses in her distal extremities. Skin exam shows no rashes ecchymoses or petechia. Neurological exam is nonfocal.  Lab Results  Component Value Date   WBC 24.0* 04/04/2015   HGB 14.1 04/04/2015   HCT 40.2 04/04/2015   MCV 92 04/04/2015   PLT 439 Platelet count confirmed by slide estimate* 04/04/2015     Chemistry      Component Value Date/Time   NA 137 03/28/2015 0722   NA 136 03/03/2011 0804   K 4.1 03/28/2015 0722   K 4.4 03/03/2011 0804   CL 101 03/28/2015 0722   CL 97* 03/03/2011 0804   CO2 25 03/28/2015 0722   CO2 26 03/03/2011 0804   BUN 21* 03/28/2015 0722   BUN 12 03/03/2011 0804   CREATININE 0.93 03/28/2015 0722   CREATININE 0.8 03/03/2011 0804      Component Value Date/Time   CALCIUM 9.1 03/28/2015 0722   CALCIUM 9.8 03/03/2011 0804   ALKPHOS 80 03/24/2015 2330   ALKPHOS 75 03/03/2011 0804   AST 21 03/24/2015 2330   AST 15 03/03/2011 0804   ALT 33 03/24/2015 2330   ALT 23 03/03/2011 0804   BILITOT 0.5 03/24/2015 2330   BILITOT 0.40 03/03/2011 0804       Impression and Plan: Ms. Janice Martin is a 54 year old white female. She has a history of relapsed chronic immune thrombocytopenia. We treated her with Rituxan. She responded very well. She got treated back in October of 2014.  I'm absolutely shocked that she now has this saddle embolism and right lower extremity thrombus. I cannot imagine that a minimally depressed protein C activity level would trigger this. I may have to re-evaluate this.  I would not repeat any scans or Dopplers on her for another couple months.  I think she will will need lifelong anticoagulation. She has had thromboembolic disease in the past.  Possibly, the fact that she had her splenectomy might put her in a her hypercoagulable category.  We have to check her blood counts weekly for right now.  I want to see her back in another 4 or 5 weeks.  I spent about 35-40  minutes with she and her husband.     [name], MD 6/15/20166:12 PM

## 2015-04-05 ENCOUNTER — Other Ambulatory Visit: Payer: Self-pay | Admitting: *Deleted

## 2015-04-05 MED ORDER — FLUCONAZOLE 100 MG PO TABS: 100.0000 mg | ORAL_TABLET | Freq: Every day | ORAL | Status: DC

## 2015-04-09 ENCOUNTER — Other Ambulatory Visit (HOSPITAL_BASED_OUTPATIENT_CLINIC_OR_DEPARTMENT_OTHER): Payer: 59

## 2015-04-09 DIAGNOSIS — D693 Immune thrombocytopenic purpura: Secondary | ICD-10-CM

## 2015-04-09 LAB — CBC WITH DIFFERENTIAL (CANCER CENTER ONLY)
BASO#: 0.1 10*3/uL (ref 0.0–0.2)
BASO%: 0.8 % (ref 0.0–2.0)
EOS%: 5.3 % (ref 0.0–7.0)
Eosinophils Absolute: 0.9 10*3/uL — ABNORMAL HIGH (ref 0.0–0.5)
HEMATOCRIT: 37.5 % (ref 34.8–46.6)
HGB: 12.8 g/dL (ref 11.6–15.9)
LYMPH#: 3.4 10*3/uL — AB (ref 0.9–3.3)
LYMPH%: 20.7 % (ref 14.0–48.0)
MCH: 32.3 pg (ref 26.0–34.0)
MCHC: 34.1 g/dL (ref 32.0–36.0)
MCV: 95 fL (ref 81–101)
MONO#: 1.6 10*3/uL — ABNORMAL HIGH (ref 0.1–0.9)
MONO%: 9.5 % (ref 0.0–13.0)
NEUT#: 10.6 10*3/uL — ABNORMAL HIGH (ref 1.5–6.5)
NEUT%: 63.7 % (ref 39.6–80.0)
PLATELETS: 487 10*3/uL — AB (ref 145–400)
RBC: 3.96 10*6/uL (ref 3.70–5.32)
RDW: 16.2 % — AB (ref 11.1–15.7)
WBC: 16.6 10*3/uL — ABNORMAL HIGH (ref 3.9–10.0)

## 2015-04-09 LAB — TECHNOLOGIST REVIEW CHCC SATELLITE

## 2015-04-09 LAB — CHCC SATELLITE - SMEAR

## 2015-04-13 ENCOUNTER — Ambulatory Visit: Payer: 59 | Admitting: Family

## 2015-04-13 ENCOUNTER — Other Ambulatory Visit: Payer: 59

## 2015-04-16 ENCOUNTER — Encounter: Payer: Self-pay | Admitting: *Deleted

## 2015-04-16 ENCOUNTER — Other Ambulatory Visit (HOSPITAL_BASED_OUTPATIENT_CLINIC_OR_DEPARTMENT_OTHER): Payer: 59

## 2015-04-16 DIAGNOSIS — D509 Iron deficiency anemia, unspecified: Secondary | ICD-10-CM

## 2015-04-16 DIAGNOSIS — D693 Immune thrombocytopenic purpura: Secondary | ICD-10-CM | POA: Diagnosis not present

## 2015-04-16 LAB — CBC WITH DIFFERENTIAL/PLATELET
BASO%: 0.6 % (ref 0.0–2.0)
Basophils Absolute: 0.1 10*3/uL (ref 0.0–0.1)
EOS%: 7.8 % — AB (ref 0.0–7.0)
Eosinophils Absolute: 0.8 10*3/uL — ABNORMAL HIGH (ref 0.0–0.5)
HEMATOCRIT: 35.1 % (ref 34.8–46.6)
HEMOGLOBIN: 11.9 g/dL (ref 11.6–15.9)
LYMPH%: 21.9 % (ref 14.0–49.7)
MCH: 31.6 pg (ref 25.1–34.0)
MCHC: 33.9 g/dL (ref 31.5–36.0)
MCV: 93.1 fL (ref 79.5–101.0)
MONO#: 0.8 10*3/uL (ref 0.1–0.9)
MONO%: 7.6 % (ref 0.0–14.0)
NEUT#: 6.7 10*3/uL — ABNORMAL HIGH (ref 1.5–6.5)
NEUT%: 62.1 % (ref 38.4–76.8)
NRBC: 0 % (ref 0–0)
PLATELETS: 246 10*3/uL (ref 145–400)
RBC: 3.77 10*6/uL (ref 3.70–5.45)
RDW: 15.6 % — ABNORMAL HIGH (ref 11.2–14.5)
WBC: 10.8 10*3/uL — ABNORMAL HIGH (ref 3.9–10.3)
lymph#: 2.4 10*3/uL (ref 0.9–3.3)

## 2015-04-16 LAB — TECHNOLOGIST REVIEW

## 2015-04-24 ENCOUNTER — Other Ambulatory Visit (HOSPITAL_BASED_OUTPATIENT_CLINIC_OR_DEPARTMENT_OTHER): Payer: 59

## 2015-04-24 DIAGNOSIS — D693 Immune thrombocytopenic purpura: Secondary | ICD-10-CM

## 2015-04-24 DIAGNOSIS — D509 Iron deficiency anemia, unspecified: Secondary | ICD-10-CM

## 2015-04-24 LAB — CBC WITH DIFFERENTIAL/PLATELET
BASO%: 0.6 % (ref 0.0–2.0)
BASOS ABS: 0.1 10*3/uL (ref 0.0–0.1)
EOS%: 8.3 % — ABNORMAL HIGH (ref 0.0–7.0)
Eosinophils Absolute: 0.7 10*3/uL — ABNORMAL HIGH (ref 0.0–0.5)
HEMATOCRIT: 36.5 % (ref 34.8–46.6)
HEMOGLOBIN: 12.2 g/dL (ref 11.6–15.9)
LYMPH#: 2.2 10*3/uL (ref 0.9–3.3)
LYMPH%: 27.4 % (ref 14.0–49.7)
MCH: 31.7 pg (ref 25.1–34.0)
MCHC: 33.4 g/dL (ref 31.5–36.0)
MCV: 94.8 fL (ref 79.5–101.0)
MONO#: 0.8 10*3/uL (ref 0.1–0.9)
MONO%: 10.4 % (ref 0.0–14.0)
NEUT#: 4.2 10*3/uL (ref 1.5–6.5)
NEUT%: 53.3 % (ref 38.4–76.8)
Platelets: 320 10*3/uL (ref 145–400)
RBC: 3.85 10*6/uL (ref 3.70–5.45)
RDW: 15.6 % — ABNORMAL HIGH (ref 11.2–14.5)
WBC: 7.9 10*3/uL (ref 3.9–10.3)
nRBC: 0 % (ref 0–0)

## 2015-04-30 ENCOUNTER — Other Ambulatory Visit (HOSPITAL_BASED_OUTPATIENT_CLINIC_OR_DEPARTMENT_OTHER): Payer: 59

## 2015-04-30 DIAGNOSIS — D509 Iron deficiency anemia, unspecified: Secondary | ICD-10-CM

## 2015-04-30 DIAGNOSIS — D693 Immune thrombocytopenic purpura: Secondary | ICD-10-CM

## 2015-04-30 LAB — CBC WITH DIFFERENTIAL/PLATELET
BASO%: 0.9 % (ref 0.0–2.0)
BASOS ABS: 0.1 10*3/uL (ref 0.0–0.1)
EOS%: 4.5 % (ref 0.0–7.0)
Eosinophils Absolute: 0.4 10*3/uL (ref 0.0–0.5)
HCT: 37.8 % (ref 34.8–46.6)
HGB: 12.7 g/dL (ref 11.6–15.9)
LYMPH%: 29.3 % (ref 14.0–49.7)
MCH: 31.4 pg (ref 25.1–34.0)
MCHC: 33.6 g/dL (ref 31.5–36.0)
MCV: 93.3 fL (ref 79.5–101.0)
MONO#: 1.1 10*3/uL — ABNORMAL HIGH (ref 0.1–0.9)
MONO%: 11.7 % (ref 0.0–14.0)
NEUT%: 53.6 % (ref 38.4–76.8)
NEUTROS ABS: 5 10*3/uL (ref 1.5–6.5)
PLATELETS: 243 10*3/uL (ref 145–400)
RBC: 4.05 10*6/uL (ref 3.70–5.45)
RDW: 15.3 % — ABNORMAL HIGH (ref 11.2–14.5)
WBC: 9.3 10*3/uL (ref 3.9–10.3)
lymph#: 2.7 10*3/uL (ref 0.9–3.3)
nRBC: 0 % (ref 0–0)

## 2015-05-08 ENCOUNTER — Ambulatory Visit (HOSPITAL_BASED_OUTPATIENT_CLINIC_OR_DEPARTMENT_OTHER): Payer: 59 | Admitting: Hematology & Oncology

## 2015-05-08 ENCOUNTER — Encounter: Payer: Self-pay | Admitting: Hematology & Oncology

## 2015-05-08 ENCOUNTER — Other Ambulatory Visit (HOSPITAL_BASED_OUTPATIENT_CLINIC_OR_DEPARTMENT_OTHER): Payer: 59

## 2015-05-08 VITALS — BP 141/72 | HR 80 | Temp 97.9°F | Wt 188.0 lb

## 2015-05-08 DIAGNOSIS — Z7901 Long term (current) use of anticoagulants: Secondary | ICD-10-CM

## 2015-05-08 DIAGNOSIS — D693 Immune thrombocytopenic purpura: Secondary | ICD-10-CM

## 2015-05-08 DIAGNOSIS — I2699 Other pulmonary embolism without acute cor pulmonale: Secondary | ICD-10-CM | POA: Diagnosis not present

## 2015-05-08 DIAGNOSIS — I2692 Saddle embolus of pulmonary artery without acute cor pulmonale: Secondary | ICD-10-CM

## 2015-05-08 LAB — CMP (CANCER CENTER ONLY)
ALT(SGPT): 29 U/L (ref 10–47)
AST: 24 U/L (ref 11–38)
Albumin: 3.8 g/dL (ref 3.3–5.5)
Alkaline Phosphatase: 74 U/L (ref 26–84)
BILIRUBIN TOTAL: 0.7 mg/dL (ref 0.20–1.60)
BUN: 9 mg/dL (ref 7–22)
CALCIUM: 9.5 mg/dL (ref 8.0–10.3)
CO2: 29 mEq/L (ref 18–33)
Chloride: 100 mEq/L (ref 98–108)
Creat: 0.8 mg/dl (ref 0.6–1.2)
Glucose, Bld: 116 mg/dL (ref 73–118)
POTASSIUM: 3.7 meq/L (ref 3.3–4.7)
Sodium: 138 mEq/L (ref 128–145)
Total Protein: 6.8 g/dL (ref 6.4–8.1)

## 2015-05-08 LAB — CBC WITH DIFFERENTIAL (CANCER CENTER ONLY)
BASO#: 0.1 10*3/uL (ref 0.0–0.2)
BASO%: 1.4 % (ref 0.0–2.0)
EOS ABS: 1.1 10*3/uL — AB (ref 0.0–0.5)
EOS%: 11.2 % — AB (ref 0.0–7.0)
HCT: 38.4 % (ref 34.8–46.6)
HGB: 13.3 g/dL (ref 11.6–15.9)
LYMPH#: 2.8 10*3/uL (ref 0.9–3.3)
LYMPH%: 29.2 % (ref 14.0–48.0)
MCH: 32.5 pg (ref 26.0–34.0)
MCHC: 34.6 g/dL (ref 32.0–36.0)
MCV: 94 fL (ref 81–101)
MONO#: 0.9 10*3/uL (ref 0.1–0.9)
MONO%: 8.9 % (ref 0.0–13.0)
NEUT#: 4.7 10*3/uL (ref 1.5–6.5)
NEUT%: 49.3 % (ref 39.6–80.0)
PLATELETS: 197 10*3/uL (ref 145–400)
RBC: 4.09 10*6/uL (ref 3.70–5.32)
RDW: 14.7 % (ref 11.1–15.7)
WBC: 9.6 10*3/uL (ref 3.9–10.0)

## 2015-05-08 LAB — CHCC SATELLITE - SMEAR

## 2015-05-08 NOTE — Progress Notes (Signed)
Hematology and Oncology Follow Up Visit  Janice Martin [id number] December 06, 1960 54 y.o. 05/08/2015   Principle Diagnosis:   Chronic immune thrombocytopenia-recurrent  Submassive pulmonary embolism, with right lower extremity thrombus  Current Therapy:    Xarelto 20 mg by mouth daily     Interim History:  [name] is back for followup. She is doing quite well. She's has had no problems with the Xarelto. She's had no bleeding. She's had no chest wall pain. There's been no shortness of breath.  She is back to work. She works in the [place] at the [employer].  Unfortunately, a daughter-in-law lost a baby at 66 weeks. This is been a little tough on her in the family.  She's had no issues with leg swelling. She's had no rashes. She's had no abdominal pain. There's been no nausea or vomiting. She is eating okay. She is not having any leg pain or any leg swelling.  Overall, her performance status is ECOG 1.  Medications:  Current outpatient prescriptions:  .  nebivolol (BYSTOLIC) 5 MG tablet, Take 1 tablet (5 mg total) by mouth daily. (Patient taking differently: Take 5 mg by mouth daily after supper. ), Disp: 90 tablet, Rfl: 3 .  pantoprazole (PROTONIX) 40 MG tablet, Take 1 tablet (40 mg total) by mouth daily., Disp: 30 tablet, Rfl: 1 .  rivaroxaban (XARELTO) 20 MG TABS tablet, Take 1 tablet (20 mg total) by mouth daily with supper., Disp: 30 tablet, Rfl: 0 .  valsartan-hydrochlorothiazide (DIOVAN-HCT) 320-25 MG per tablet, Take 1 tablet by mouth daily., Disp: 90 tablet, Rfl: 3  Allergies:  Allergies  Allergen Reactions  . Shellfish Allergy Anaphylaxis  . Influenza Vaccines     Per pt can't take mess up with her immune system  . Morphine Other (See Comments)    Burns really bad    Past Medical History, Surgical history, Social history, and Family History were reviewed and updated.  Review of Systems: As above  Physical Exam:  weight is 188 lb (85.276  kg). Her oral temperature is 97.9 F (36.6 C). Her blood pressure is 141/72 and her pulse is 80.   Well-developed and well-nourished white female. Head and neck exam shows no ocular or oral lesions. There is no adenopathy of the neck. Lungs are clear bilaterally. She has no rales, wheezes or rhonchi. Cardiac exam is regular rate and rhythm with no murmurs rubs or bruits. Abdomen has a well-healed splenectomy scar. She has no fluid wave. There is no palpable liver edge. Back exam no tenderness over the spine ribs or hips. Extremities no clubbing cyanosis or edema. I cannot palpate any venous cord in the right leg. She has a negative Homans sign in the right leg. She has good pulses in her distal extremities. Skin exam shows no rashes ecchymoses or petechia. Neurological exam is nonfocal.  Lab Results  Component Value Date   WBC 9.6 05/08/2015   HGB 13.3 05/08/2015   HCT 38.4 05/08/2015   MCV 94 05/08/2015   PLT 197 05/08/2015     Chemistry      Component Value Date/Time   NA 135 04/04/2015 1422   NA 136 03/03/2011 0804   K 3.7 04/04/2015 1422   K 4.4 03/03/2011 0804   CL 99 04/04/2015 1422   CL 97* 03/03/2011 0804   CO2 27 04/04/2015 1422   CO2 26 03/03/2011 0804   BUN 32* 04/04/2015 1422   BUN 12 03/03/2011 0804   CREATININE 1.12*  04/04/2015 1422   CREATININE 0.8 03/03/2011 0804      Component Value Date/Time   CALCIUM 8.7 04/04/2015 1422   CALCIUM 9.8 03/03/2011 0804   ALKPHOS 80 03/24/2015 2330   ALKPHOS 75 03/03/2011 0804   AST 21 03/24/2015 2330   AST 15 03/03/2011 0804   ALT 33 03/24/2015 2330   ALT 23 03/03/2011 0804   BILITOT 0.5 03/24/2015 2330   BILITOT 0.40 03/03/2011 0804       Impression and Plan: Janice Martin is a 54 year old white female. She has a history of relapsed chronic immune thrombocytopenia. We treated her with Rituxan. She responded very well. She got treated back in October of 2014.  She will need lifelong anticoagulation.  We will check another  CT angiogram only see her back in 2 months. I think this would be reasonable. Hopefully, we will see resolution.  Her platelet count is okay. I really don't think we need any lab work in between visits will she starts having symptoms.  I spent about 30 minutes with her today.     [name], MD 7/19/20168:33 AM

## 2015-05-10 LAB — PROTEIN C, TOTAL: PROTEIN C ANTIGEN: 106 % (ref 70–140)

## 2015-05-10 LAB — PROTEIN C ACTIVITY: Protein C Activity: 187 % — ABNORMAL HIGH (ref 75–133)

## 2015-05-11 ENCOUNTER — Telehealth: Payer: Self-pay | Admitting: *Deleted

## 2015-05-11 NOTE — Telephone Encounter (Signed)
Patient would like to have labs drawn in August to monitor her platelets. She feels unsure about waiting until her next appointment in September. Spoke to Dr Marin Olp who is fine with brining the patient in, in late August for a lab check. Appointment made.

## 2015-05-14 ENCOUNTER — Emergency Department (HOSPITAL_COMMUNITY): Payer: 59

## 2015-05-14 ENCOUNTER — Encounter (HOSPITAL_COMMUNITY): Payer: Self-pay | Admitting: Emergency Medicine

## 2015-05-14 ENCOUNTER — Emergency Department (HOSPITAL_COMMUNITY)
Admission: EM | Admit: 2015-05-14 | Discharge: 2015-05-14 | Disposition: A | Discharge status: Home / Self Care | Payer: 59 | Attending: Emergency Medicine | Admitting: Emergency Medicine

## 2015-05-14 DIAGNOSIS — R609 Edema, unspecified: Secondary | ICD-10-CM | POA: Insufficient documentation

## 2015-05-14 DIAGNOSIS — Z86711 Personal history of pulmonary embolism: Secondary | ICD-10-CM | POA: Insufficient documentation

## 2015-05-14 DIAGNOSIS — I10 Essential (primary) hypertension: Secondary | ICD-10-CM | POA: Diagnosis not present

## 2015-05-14 DIAGNOSIS — D509 Iron deficiency anemia, unspecified: Secondary | ICD-10-CM | POA: Insufficient documentation

## 2015-05-14 DIAGNOSIS — Z79899 Other long term (current) drug therapy: Secondary | ICD-10-CM | POA: Diagnosis not present

## 2015-05-14 DIAGNOSIS — Z86718 Personal history of other venous thrombosis and embolism: Secondary | ICD-10-CM | POA: Insufficient documentation

## 2015-05-14 DIAGNOSIS — R079 Chest pain, unspecified: Secondary | ICD-10-CM | POA: Diagnosis present

## 2015-05-14 DIAGNOSIS — R0789 Other chest pain: Secondary | ICD-10-CM | POA: Insufficient documentation

## 2015-05-14 DIAGNOSIS — Z8659 Personal history of other mental and behavioral disorders: Secondary | ICD-10-CM | POA: Diagnosis not present

## 2015-05-14 LAB — COMPREHENSIVE METABOLIC PANEL
ALBUMIN: 4 g/dL (ref 3.5–5.0)
ALK PHOS: 77 U/L (ref 38–126)
ALT: 23 U/L (ref 14–54)
ANION GAP: 9 (ref 5–15)
AST: 16 U/L (ref 15–41)
BILIRUBIN TOTAL: 0.4 mg/dL (ref 0.3–1.2)
BUN: 6 mg/dL (ref 6–20)
CHLORIDE: 97 mmol/L — AB (ref 101–111)
CO2: 30 mmol/L (ref 22–32)
CREATININE: 0.86 mg/dL (ref 0.44–1.00)
Calcium: 9.2 mg/dL (ref 8.9–10.3)
GFR calc non Af Amer: >60 mL/min (ref >60)
GLUCOSE: 103 mg/dL — AB (ref 65–99)
Potassium: 3.1 mmol/L — ABNORMAL LOW (ref 3.5–5.1)
SODIUM: 136 mmol/L (ref 135–145)
Total Protein: 7.5 g/dL (ref 6.5–8.1)

## 2015-05-14 LAB — CBC
HEMATOCRIT: 38.4 % (ref 36.0–46.0)
Hemoglobin: 13.3 g/dL (ref 12.0–15.0)
MCH: 32.8 pg (ref 26.0–34.0)
MCHC: 34.6 g/dL (ref 30.0–36.0)
MCV: 94.6 fL (ref 78.0–100.0)
Platelets: 196 10*3/uL (ref 150–400)
RBC: 4.06 MIL/uL (ref 3.87–5.11)
RDW: 14.8 % (ref 11.5–15.5)
WBC: 14.7 10*3/uL — AB (ref 4.0–10.5)

## 2015-05-14 LAB — I-STAT TROPONIN, ED: TROPONIN I, POC: 0 ng/mL (ref 0.00–0.08)

## 2015-05-14 MED ORDER — HYDROCODONE-ACETAMINOPHEN 5-325 MG PO TABS
2.0000 | ORAL_TABLET | Freq: Once | ORAL | Status: AC
  Administered 2015-05-14: 2 via ORAL
  Filled 2015-05-14: qty 2

## 2015-05-14 MED ORDER — POTASSIUM CHLORIDE CRYS ER 20 MEQ PO TBCR
40.0000 meq | EXTENDED_RELEASE_TABLET | Freq: Once | ORAL | Status: AC
  Administered 2015-05-14: 40 meq via ORAL
  Filled 2015-05-14: qty 2

## 2015-05-14 MED ORDER — HYDROCODONE-ACETAMINOPHEN 5-325 MG PO TABS: 1.0000 | ORAL_TABLET | ORAL | Status: DC | PRN

## 2015-05-14 NOTE — ED Notes (Signed)
Nurse in room about to start IV

## 2015-05-14 NOTE — ED Provider Notes (Signed)
CSN: [id number]     Arrival date & time 05/14/15  1212 History   First MD Initiated Contact with Patient 05/14/15 1228     Chief Complaint  Patient presents with  . rib cage pain     (Consider location/radiation/quality/duration/timing/severity/associated sxs/prior Treatment) HPI Comments: 54 year old female with a history of ITP, hypertension, DVT and PE, currently on chronic Xarelto, presents to the emergency department for further evaluation of left-sided chest pain. Patient states that she began to experience a sudden sharp pain underneath her left axilla this morning. Her pain has been fairly constant and worsens with deep breathing. She also notes worsening pain with palpation to the area. No medications taken PTA for symptoms. She denies any worsening leg swelling, shortness of breath, fever, syncope or near-syncope, nausea, or vomiting. She has f/u scheduled with Dr. Chase Caller tomorrow regarding reevaluation of her saddle PE diagnosed in June 2016.  The history is provided by the patient. No language interpreter was used.    Past Medical History  Diagnosis Date  . ANEMIA-IRON DEFICIENCY   . Immune thrombocytopenic purpura 2003 dx    s/p rituxan 06/2012 - now remission  . LEUKOCYTOSIS UNSPECIFIED   . Anxiety state, unspecified   . HYPERTENSION   . DVT of leg (deep venous thrombosis) 04/04/2015   Past Surgical History  Procedure Laterality Date  . Cholecystectomy    . Spleenectomy    . Cesarean section      x's 2   Family History  Problem Relation Age of Onset  . Arthritis Mother   . Arthritis Father   . Mental illness Father   . Hypertension Other     Parent  . Heart disease Other     Grandparent  . Stroke Other     grandparent   History  Substance Use Topics  . Smoking status: Never Smoker   . Smokeless tobacco: Never Used     Comment: never used product  . Alcohol Use: No   OB History    No data available      Review of Systems  Constitutional: Negative  for fever.  Respiratory: Negative for shortness of breath.   Cardiovascular: Positive for chest pain. Negative for leg swelling.  Gastrointestinal: Negative for nausea and vomiting.  Neurological: Negative for syncope and light-headedness.  All other systems reviewed and are negative.   Allergies  Shellfish allergy; Influenza vaccines; and Morphine  Home Medications   Prior to Admission medications   Medication Sig Start Date End Date Taking? Authorizing Provider  nebivolol (BYSTOLIC) 5 MG tablet Take 1 tablet (5 mg total) by mouth daily. Patient taking differently: Take 5 mg by mouth daily after supper.  06/27/14  Yes Rowe Clack, MD  pantoprazole (PROTONIX) 40 MG tablet Take 1 tablet (40 mg total) by mouth daily. 03/28/15  Yes Rahul P Desai, PA-C  rivaroxaban (XARELTO) 20 MG TABS tablet Take 1 tablet (20 mg total) by mouth daily with supper. 03/28/15  Yes Rahul P Desai, PA-C  valsartan-hydrochlorothiazide (DIOVAN-HCT) 320-25 MG per tablet Take 1 tablet by mouth daily. 06/27/14  Yes Rowe Clack, MD  vitamin B-12 (CYANOCOBALAMIN) 100 MCG tablet Take 100 mcg by mouth daily.   Yes Historical Provider, MD   BP 129/70 mmHg  Pulse 93  Temp(Src) 98.5 F (36.9 C) (Oral)  Resp 16  SpO2 100%   Physical Exam  Constitutional: She is oriented to person, place, and time. She appears well-developed and well-nourished. No distress.  Nontoxic/nonseptic appearing  HENT:  Head: Normocephalic and atraumatic.  Eyes: Conjunctivae and EOM are normal. No scleral icterus.  Neck: Normal range of motion.  Cardiovascular: Normal rate, regular rhythm and intact distal pulses.   Pulmonary/Chest: Effort normal and breath sounds normal. No respiratory distress. She has no wheezes. She has no rales. She exhibits tenderness.  Chest expansion symmetric. Lungs clear bilaterally. Tenderness to palpation noted to the left anterior chest wall, along the anterior axillary line. No bony deformity or crepitus.   Musculoskeletal: Normal range of motion. She exhibits edema.  1+ pitting edema in the right lower extremity. Patient states that this is per her baseline; she has a history of four blood clots in this leg.  Neurological: She is alert and oriented to person, place, and time. She exhibits normal muscle tone. Coordination normal.  Skin: Skin is warm and dry. No rash noted. She is not diaphoretic. No erythema. No pallor.  Psychiatric: She has a normal mood and affect. Her behavior is normal.  Nursing note and vitals reviewed.   ED Course  Procedures (including critical care time) Labs Review Labs Reviewed  COMPREHENSIVE METABOLIC PANEL - Abnormal; Notable for the following:    Potassium 3.1 (*)    Chloride 97 (*)    Glucose, Bld 103 (*)    All other components within normal limits  CBC - Abnormal; Notable for the following:    WBC 14.7 (*)    All other components within normal limits  I-STAT TROPOININ, ED    Imaging Review Dg Chest 2 View  05/14/2015   CLINICAL DATA:  History of blood clots  EXAM: CHEST  2 VIEW  COMPARISON:  03/24/2015  FINDINGS: Heart size upper normal. Vascularity normal. Negative for heart failure. Mild atelectasis in the lung bases. No effusion.  IMPRESSION: Mild bibasilar atelectasis.   Electronically Signed   By: Franchot Gallo M.D.   On: 05/14/2015 13:10     EKG Interpretation   Date/Time:  Monday May 14 2015 12:45:46 EDT Ventricular Rate:  89 PR Interval:  165 QRS Duration: 86 QT Interval:  374 QTC Calculation: 455 R Axis:   16 Text Interpretation:  Sinus rhythm No significant change was found  Interpretation limited secondary to artifact Confirmed by CAMPOS  MD,  Lennette Bihari ([id number]) on 05/14/2015 12:55:07 PM      MDM   Final diagnoses:  Left-sided chest wall pain    54 year old female with history of pulmonary embolus and DVT, currently on Xarelto, presents to the emergency department for sudden onset of sharp chest pain on the left side of her  chest. Patient has some mild bi-basilar atelectasis on chest x-ray. Her EKG is nonischemic and her troponin is negative. Chest pain is mildly pleuritic. It is aggravated with palpation to the chest wall.  Suspect that that chest pain is MSK in etiology. Patient does, however, have a known risk for DVT. She is currently on blood thinners. She has no signs of heart strain on workup today. She also has no tachycardia or hypoxia. She has no complaints of shortness of breath. Risks vs benefits of repeat CTA imaging discussed with my attending, Dr. Venora Maples, who has also seen and evaluated the patient. Given patient's hemodynamic stability without c/o respiratory symptoms or distress, conservative management opted for on this occasion. Chances of new blood clot thought to be low especially given patient's compliance with her chronic blood thinners. Conservative management was felt to be the best course of care for the patient, also, because she has such close follow-up  with her pulmonologist; appointment scheduled tomorrow for reevaluation since DVT/PE diagnosis in June. Will attempt pain control as outpatient with a short course Norco. Have discussed reasons to return to the emergency department with the patient. Return precautions also provided on discharge. Patient discharged in good condition with no unaddressed concerns; VSS.   Filed Vitals:   05/14/15 1225 05/14/15 1417  BP: 129/70 124/66  Pulse: 93 93  Temp: 98.5 F (36.9 C)   TempSrc: Oral   Resp: 16 18  SpO2: 100% 96%       Antonietta Breach, PA-C 05/14/15 1455  Jola Schmidt, MD 05/14/15 [phone]

## 2015-05-14 NOTE — ED Notes (Signed)
Per pt, states left upper rib cage pain since this am-history of clots, no SOB

## 2015-05-14 NOTE — Discharge Instructions (Signed)
Follow-up with your lung doctor tomorrow regarding your symptoms. Continue taking Xarelto. You may take Norco as needed for pain control. Alternate ice and heat to areas of pain. Return to the emergency department if symptoms worsen such as if you develop lightheadedness, shortness of breath, worsening chest pain, or any of the symptoms listed below.  Chest Wall Pain Chest wall pain is pain in or around the bones and muscles of your chest. It may take up to 6 weeks to get better. It may take longer if you must stay physically active in your work and activities.  CAUSES  Chest wall pain may happen on its own. However, it may be caused by:  A viral illness like the flu.  Injury.  Coughing.  Exercise.  Arthritis.  Fibromyalgia.  Shingles. HOME CARE INSTRUCTIONS   Avoid overtiring physical activity. Try not to strain or perform activities that cause pain. This includes any activities using your chest or your abdominal and side muscles, especially if heavy weights are used.  Put ice on the sore area.  Put ice in a plastic bag.  Place a towel between your skin and the bag.  Leave the ice on for 15-20 minutes per hour while awake for the first 2 days.  Only take over-the-counter or prescription medicines for pain, discomfort, or fever as directed by your caregiver. SEEK IMMEDIATE MEDICAL CARE IF:   Your pain increases, or you are very uncomfortable.  You have a fever.  Your chest pain becomes worse.  You have new, unexplained symptoms.  You have nausea or vomiting.  You feel sweaty or lightheaded.  You have a cough with phlegm (sputum), or you cough up blood. MAKE SURE YOU:   Understand these instructions.  Will watch your condition.  Will get help right away if you are not doing well or get worse. Document Released: 10/06/2005 Document Revised: 12/29/2011 Document Reviewed: 06/02/2011 [facility] Patient Information 2015 [place], [place]. This information is not intended  to replace advice given to you by your health care provider. Make sure you discuss any questions you have with your health care provider.

## 2015-05-14 NOTE — ED Notes (Signed)
PA at bedside.

## 2015-05-14 NOTE — ED Notes (Signed)
Patient transported to X-ray 

## 2015-05-15 ENCOUNTER — Ambulatory Visit (INDEPENDENT_AMBULATORY_CARE_PROVIDER_SITE_OTHER): Payer: 59 | Admitting: Internal Medicine

## 2015-05-15 ENCOUNTER — Encounter: Payer: Self-pay | Admitting: Internal Medicine

## 2015-05-15 ENCOUNTER — Encounter: Payer: Self-pay | Admitting: Emergency Medicine

## 2015-05-15 VITALS — BP 152/94 | HR 90 | Ht 64.0 in | Wt 188.0 lb

## 2015-05-15 DIAGNOSIS — R0789 Other chest pain: Secondary | ICD-10-CM | POA: Insufficient documentation

## 2015-05-15 DIAGNOSIS — I2699 Other pulmonary embolism without acute cor pulmonale: Secondary | ICD-10-CM | POA: Diagnosis not present

## 2015-05-15 MED ORDER — RIVAROXABAN 20 MG PO TABS: 20.0000 mg | ORAL_TABLET | Freq: Every day | ORAL | Status: DC

## 2015-05-15 NOTE — Progress Notes (Signed)
Subjective:    Patient ID: Janice Martin, female    DOB: Sep 07, 1961, 54 y.o.   MRN: [id number]  HPI     OV 05/15/2015  Chief Complaint  Patient presents with  . Follow-up    Pt here for HFU for PE. Pt recently went to ED for CP on 7.25.16. Pt c/o upper left CP that radiates to laft scapula. Pt stated the pain occurs when she inhales and moves her left arm. The pain is relieved by rest.     Follow-up submassive pulmonary embolism early June 2016 in the context of having idiopathic, trhombo cytopenic purpura/. She was discharged on Xarelto  - She follows up today for monthly follow-up. She presents with her husband. Both attest that she is doing well. There is no dyspnea or cough. Of note cough is the original symptom that resulted in the PE diagnosis. This is certainly not there. There is no exertional dyspnea or syncope or hypoxemia. She has been attending work.  The new problem is that over the weekend 2 days ago she had her 37-month-old grandchild and has been carrying the grandchild a lot. Then all of a sudden acutely yesterday she developed left infra-axillary pain that is worse with her lifting her elbow and taking deep breath. It is severe. She is unable to work. She ended up in the emergency department. I reviewed the ED department notes. The diagnoses muscular skeletal chest pain. However she is frustrated that did not check up of pulmonary embolism in the form of d-dimer or  CT chest. The relevant investigations are listed below    PULMONARY No results for input(s): PHART, PCO2ART, PO2ART, HCO3, TCO2, O2SAT in the last 168 hours.  Invalid input(s): PCO2, PO2  CBC  Recent Labs Lab 05/14/15 1341  HGB 13.3  HCT 38.4  WBC 14.7*  PLT 196    COAGULATION No results for input(s): INR in the last 168 hours.  CARDIAC  No results for input(s): TROPONINI in the last 168 hours. No results for input(s): PROBNP in the last 168 hours.   CHEMISTRY  Recent Labs Lab  05/14/15 1248  NA 136  K 3.1*  CL 97*  CO2 30  GLUCOSE 103*  BUN 6  CREATININE 0.86  CALCIUM 9.2   Estimated Creatinine Clearance: 80.8 mL/min (by C-G formula based on Cr of 0.86).   LIVER  Recent Labs Lab 05/14/15 1248  AST 16  ALT 23  ALKPHOS 77  BILITOT 0.4  PROT 7.5  ALBUMIN 4.0     INFECTIOUS No results for input(s): LATICACIDVEN, PROCALCITON in the last 168 hours.   ENDOCRINE CBG (last 3)  No results for input(s): GLUCAP in the last 72 hours.       IMAGING x48h  - image(s) personally visualized  -   highlighted in bold Dg Chest 2 View  05/14/2015   CLINICAL DATA:  History of blood clots  EXAM: CHEST  2 VIEW  COMPARISON:  03/24/2015  FINDINGS: Heart size upper normal. Vascularity normal. Negative for heart failure. Mild atelectasis in the lung bases. No effusion.  IMPRESSION: Mild bibasilar atelectasis.   Electronically Signed   By: Franchot Gallo M.D.   On: 05/14/2015 13:10         Current outpatient prescriptions:  .  nebivolol (BYSTOLIC) 5 MG tablet, Take 1 tablet (5 mg total) by mouth daily. (Patient taking differently: Take 5 mg by mouth daily after supper. ), Disp: 90 tablet, Rfl: 3 .  rivaroxaban (XARELTO) 20  MG TABS tablet, Take 1 tablet (20 mg total) by mouth daily with supper., Disp: 30 tablet, Rfl: 0 .  valsartan-hydrochlorothiazide (DIOVAN-HCT) 320-25 MG per tablet, Take 1 tablet by mouth daily., Disp: 90 tablet, Rfl: 3 .  vitamin B-12 (CYANOCOBALAMIN) 100 MCG tablet, Take 100 mcg by mouth daily., Disp: , Rfl:  .  HYDROcodone-acetaminophen (NORCO/VICODIN) 5-325 MG per tablet, Take 1 tablet by mouth every 4 (four) hours as needed for severe pain. (Patient not taking: Reported on 05/15/2015), Disp: 11 tablet, Rfl: 0 .  pantoprazole (PROTONIX) 40 MG tablet, Take 1 tablet (40 mg total) by mouth daily. (Patient not taking: Reported on 05/15/2015), Disp: 30 tablet, Rfl: 1                             Review of Systems  Constitutional: Negative for  fever and unexpected weight change.  HENT: Negative for congestion, dental problem, ear pain, nosebleeds, postnasal drip, rhinorrhea, sinus pressure, sneezing, sore throat and trouble swallowing.   Eyes: Negative for redness and itching.  Respiratory: Negative for cough, chest tightness, shortness of breath and wheezing.   Cardiovascular: Positive for chest pain. Negative for palpitations and leg swelling.  Gastrointestinal: Negative for nausea and vomiting.  Genitourinary: Negative for dysuria.  Musculoskeletal: Negative for joint swelling.  Skin: Negative for rash.  Neurological: Negative for headaches.  Hematological: Does not bruise/bleed easily.  Psychiatric/Behavioral: Negative for dysphoric mood. The patient is not nervous/anxious.        Objective:   Physical Exam  Constitutional: She is oriented to person, place, and time. She appears well-developed and well-nourished. No distress.  HENT:  Head: Normocephalic and atraumatic.  Right Ear: External ear normal.  Left Ear: External ear normal.  Mouth/Throat: Oropharynx is clear and moist. No oropharyngeal exudate.  Eyes: Conjunctivae and EOM are normal. Pupils are equal, round, and reactive to light. Right eye exhibits no discharge. Left eye exhibits no discharge. No scleral icterus.  Neck: Normal range of motion. Neck supple. No JVD present. No tracheal deviation present. No thyromegaly present.  Cardiovascular: Normal rate, regular rhythm, normal heart sounds and intact distal pulses.  Exam reveals no gallop and no friction rub.   No murmur heard. Pulmonary/Chest: Effort normal and breath sounds normal. No respiratory distress. She has no wheezes. She has no rales. She exhibits no tenderness.  Point tenderness + all along left infra-axillary area  Abdominal: Soft. Bowel sounds are normal. She exhibits no distension and no mass. There is no tenderness. There is no rebound and no guarding.  Musculoskeletal: Normal range of motion.  She exhibits no edema or tenderness.  Lymphadenopathy:    She has no cervical adenopathy.  Neurological: She is alert and oriented to person, place, and time. She has normal reflexes. No cranial nerve deficit. She exhibits normal muscle tone. Coordination normal.  Skin: Skin is warm and dry. No rash noted. She is not diaphoretic. No erythema. No pallor.  Psychiatric: She has a normal mood and affect. Her behavior is normal. Judgment and thought content normal.  Vitals reviewed.   Filed Vitals:   05/15/15 1614  BP: 152/94  Pulse: 90  Height: 5\' 4"  (1.626 m)  Weight: 188 lb (85.276 kg)  SpO2: 97%         Assessment & Plan:     ICD-9-CM ICD-10-CM   1. Embolism, pulmonary with infarction 415.19 I26.99   2. Musculoskeletal chest pain 786.59 R07.89     #PE  -  continue xarelto 20mg  daily - will do refill   - ok for sept 2016 ct per Dr. Alver Fisher  #Left infra-axillary pain - new proble  - Most likely musculoskeletal based on history and physical exam - Do rest and massage and heat therapy - Take off the rest of the week from work - hold off on investigating for PE recurrence; if worsens then do so  #Follow-up  - 6 months or sooner if needed    Dr. Brand Males, M.D., [facility].C.P Pulmonary and Critical Care Medicine Staff Physician [facility] Pager: [phone], If no answer or between  15:00h - 7:00h: call [phone]  05/15/2015 4:44 PM

## 2015-05-15 NOTE — Patient Instructions (Signed)
ICD-9-CM ICD-10-CM   1. Embolism, pulmonary with infarction 415.19 I26.99   2. Musculoskeletal chest pain 786.59 R07.89    #PE  - continue xarelto 20mg  daily - will do refill   - ok for sept 2016 ct per Dr. Alver Fisher  #Left infra-axillary pain  - Most likely musculoskeletal based on history and physical exam - Do rest and massage and heat therapy - Take off the rest of the week from work  #Follow-up  - 6 months or sooner if needed

## 2015-05-16 ENCOUNTER — Ambulatory Visit: Payer: 59 | Admitting: Hematology & Oncology

## 2015-05-16 ENCOUNTER — Other Ambulatory Visit: Payer: 59

## 2015-05-21 ENCOUNTER — Telehealth: Payer: Self-pay | Admitting: Internal Medicine

## 2015-05-21 NOTE — Telephone Encounter (Signed)
I have received the FMLA forms from [employer] up front and sent them down to [employer]

## 2015-05-21 NOTE — Telephone Encounter (Signed)
Noted  

## 2015-05-28 ENCOUNTER — Encounter (HOSPITAL_COMMUNITY): Payer: Self-pay | Admitting: Emergency Medicine

## 2015-05-28 ENCOUNTER — Emergency Department (HOSPITAL_COMMUNITY)
Admission: EM | Admit: 2015-05-28 | Discharge: 2015-05-28 | Disposition: A | Discharge status: Home / Self Care | Payer: 59 | Attending: Emergency Medicine | Admitting: Emergency Medicine

## 2015-05-28 DIAGNOSIS — Z86711 Personal history of pulmonary embolism: Secondary | ICD-10-CM | POA: Diagnosis not present

## 2015-05-28 DIAGNOSIS — Z8659 Personal history of other mental and behavioral disorders: Secondary | ICD-10-CM | POA: Insufficient documentation

## 2015-05-28 DIAGNOSIS — Z7901 Long term (current) use of anticoagulants: Secondary | ICD-10-CM | POA: Diagnosis not present

## 2015-05-28 DIAGNOSIS — R42 Dizziness and giddiness: Secondary | ICD-10-CM

## 2015-05-28 DIAGNOSIS — Z86718 Personal history of other venous thrombosis and embolism: Secondary | ICD-10-CM | POA: Diagnosis not present

## 2015-05-28 DIAGNOSIS — Z862 Personal history of diseases of the blood and blood-forming organs and certain disorders involving the immune mechanism: Secondary | ICD-10-CM | POA: Insufficient documentation

## 2015-05-28 DIAGNOSIS — Z79899 Other long term (current) drug therapy: Secondary | ICD-10-CM | POA: Insufficient documentation

## 2015-05-28 DIAGNOSIS — I10 Essential (primary) hypertension: Secondary | ICD-10-CM | POA: Insufficient documentation

## 2015-05-28 HISTORY — DX: Other pulmonary embolism without acute cor pulmonale: I26.99

## 2015-05-28 LAB — CBC WITH DIFFERENTIAL/PLATELET
Band Neutrophils: 2 % (ref 0–10)
Basophils Absolute: 0.1 10*3/uL (ref 0.0–0.1)
Basophils Relative: 1 % (ref 0–1)
Blasts: 0 %
Eosinophils Absolute: 0.6 10*3/uL (ref 0.0–0.7)
Eosinophils Relative: 4 % (ref 0–5)
HCT: 38.5 % (ref 36.0–46.0)
HEMOGLOBIN: 13 g/dL (ref 12.0–15.0)
LYMPHS PCT: 21 % (ref 12–46)
Lymphs Abs: 2.9 10*3/uL (ref 0.7–4.0)
MCH: 31.3 pg (ref 26.0–34.0)
MCHC: 33.8 g/dL (ref 30.0–36.0)
MCV: 92.5 fL (ref 78.0–100.0)
METAMYELOCYTES PCT: 0 %
MONO ABS: 0.6 10*3/uL (ref 0.1–1.0)
MYELOCYTES: 0 %
Monocytes Relative: 4 % (ref 3–12)
NEUTROS ABS: 9.8 10*3/uL — AB (ref 1.7–7.7)
NRBC: 0 /100{WBCs}
Neutrophils Relative %: 68 % (ref 43–77)
PLATELETS: 164 10*3/uL (ref 150–400)
Promyelocytes Absolute: 0 %
RBC: 4.16 MIL/uL (ref 3.87–5.11)
RDW: 14.1 % (ref 11.5–15.5)
WBC: 14 10*3/uL — ABNORMAL HIGH (ref 4.0–10.5)

## 2015-05-28 LAB — BASIC METABOLIC PANEL
Anion gap: 7 (ref 5–15)
BUN: 8 mg/dL (ref 6–20)
CALCIUM: 9.3 mg/dL (ref 8.9–10.3)
CO2: 30 mmol/L (ref 22–32)
CREATININE: 1.1 mg/dL — AB (ref 0.44–1.00)
Chloride: 99 mmol/L — ABNORMAL LOW (ref 101–111)
GFR calc Af Amer: >60 mL/min (ref >60)
GFR, EST NON AFRICAN AMERICAN: 57 mL/min — AB (ref >60)
Glucose, Bld: 143 mg/dL — ABNORMAL HIGH (ref 65–99)
Potassium: 3.1 mmol/L — ABNORMAL LOW (ref 3.5–5.1)
Sodium: 136 mmol/L (ref 135–145)

## 2015-05-28 MED ORDER — MECLIZINE HCL 25 MG PO TABS
25.0000 mg | ORAL_TABLET | Freq: Once | ORAL | Status: AC
  Administered 2015-05-28: 25 mg via ORAL
  Filled 2015-05-28: qty 1

## 2015-05-28 MED ORDER — POTASSIUM CHLORIDE CRYS ER 20 MEQ PO TBCR
20.0000 meq | EXTENDED_RELEASE_TABLET | Freq: Once | ORAL | Status: AC
  Administered 2015-05-28: 20 meq via ORAL
  Filled 2015-05-28: qty 1

## 2015-05-28 MED ORDER — MECLIZINE HCL 50 MG PO TABS: 25.0000 mg | ORAL_TABLET | Freq: Three times a day (TID) | ORAL | Status: DC | PRN

## 2015-05-28 NOTE — Discharge Instructions (Signed)
Medication for dizziness. Your potassium is slightly low and your glucose is slightly high. Platelets are normal. Follow-up your regular doctor.

## 2015-05-28 NOTE — ED Notes (Signed)
Pt reports sudden onset of dizziness (sensation of room spinning versus weakness) that began at 0730 before driving to work. Dizziness accompanied by nausea. No pain, SOB, weakness or diarrhea.

## 2015-05-28 NOTE — ED Provider Notes (Signed)
CSN: [id number]     Arrival date & time 05/28/15  0801 History   First MD Initiated Contact with Patient 05/28/15 0805     Chief Complaint  Patient presents with  . Dizziness     (Consider location/radiation/quality/duration/timing/severity/associated sxs/prior Treatment) HPI.... Sense of room spinning and dizziness with associated nausea approximate 1 hour prior to visit. This does not normally happen to the patient. Past medical history includes a saddle pulmonary embolism in early June secondary to a right leg DVT. She is presently on Xarelto. Additionally she has ITP and her platelet count is often times low. No stiff neck, fever, chills, gross neurological deficits. She is feeling better now.  Past Medical History  Diagnosis Date  . ANEMIA-IRON DEFICIENCY   . Immune thrombocytopenic purpura 2003 dx    s/p rituxan 06/2012 - now remission  . LEUKOCYTOSIS UNSPECIFIED   . Anxiety state, unspecified   . HYPERTENSION   . DVT of leg (deep venous thrombosis) 04/04/2015  . Pulmonary embolism June 2016   Past Surgical History  Procedure Laterality Date  . Cholecystectomy    . Spleenectomy    . Cesarean section      x's 2   Family History  Problem Relation Age of Onset  . Arthritis Mother   . Arthritis Father   . Mental illness Father   . Hypertension Other     Parent  . Heart disease Other     Grandparent  . Stroke Other     grandparent   History  Substance Use Topics  . Smoking status: Never Smoker   . Smokeless tobacco: Never Used     Comment: never used product  . Alcohol Use: No   OB History    No data available     Review of Systems  All other systems reviewed and are negative.     Allergies  Shellfish allergy; Influenza vaccines; and Morphine  Home Medications   Prior to Admission medications   Medication Sig Start Date End Date Taking? Authorizing Provider  nebivolol (BYSTOLIC) 5 MG tablet Take 1 tablet (5 mg total) by mouth daily. Patient taking  differently: Take 5 mg by mouth daily after supper.  06/27/14  Yes Rowe Clack, MD  rivaroxaban (XARELTO) 20 MG TABS tablet Take 1 tablet (20 mg total) by mouth daily with supper. 05/15/15  Yes Brand Males, MD  valsartan-hydrochlorothiazide (DIOVAN-HCT) 320-25 MG per tablet Take 1 tablet by mouth daily. 06/27/14  Yes Rowe Clack, MD  vitamin B-12 (CYANOCOBALAMIN) 100 MCG tablet Take 100 mcg by mouth daily.   Yes Historical Provider, MD  HYDROcodone-acetaminophen (NORCO/VICODIN) 5-325 MG per tablet Take 1 tablet by mouth every 4 (four) hours as needed for severe pain. Patient not taking: Reported on 05/15/2015 05/14/15   Antonietta Breach, PA-C  meclizine (ANTIVERT) 50 MG tablet Take 0.5 tablets (25 mg total) by mouth 3 (three) times daily as needed for dizziness or nausea. 05/28/15   Nat Christen, MD  pantoprazole (PROTONIX) 40 MG tablet Take 1 tablet (40 mg total) by mouth daily. Patient not taking: Reported on 05/15/2015 03/28/15   Rahul P Desai, PA-C   BP 124/72 mmHg  Pulse 78  Temp(Src) 97.8 F (36.6 C) (Oral)  Resp 16  SpO2 99% Physical Exam  Constitutional: She is oriented to person, place, and time. She appears well-developed and well-nourished.  HENT:  Head: Normocephalic and atraumatic.  Eyes: Conjunctivae and EOM are normal. Pupils are equal, round, and reactive to light.  Neck: Normal  range of motion. Neck supple.  Cardiovascular: Normal rate and regular rhythm.   Pulmonary/Chest: Effort normal and breath sounds normal.  Abdominal: Soft. Bowel sounds are normal.  Musculoskeletal: Normal range of motion.  Neurological: She is alert and oriented to person, place, and time.  Skin: Skin is warm and dry.  Psychiatric: She has a normal mood and affect. Her behavior is normal.  Nursing note and vitals reviewed.   ED Course  Procedures (including critical care time) Labs Review Labs Reviewed  CBC WITH DIFFERENTIAL/PLATELET - Abnormal; Notable for the following:    WBC 14.0 (*)     Neutro Abs 9.8 (*)    All other components within normal limits  BASIC METABOLIC PANEL - Abnormal; Notable for the following:    Potassium 3.1 (*)    Chloride 99 (*)    Glucose, Bld 143 (*)    Creatinine, Ser 1.10 (*)    GFR calc non Af Amer 57 (*)    All other components within normal limits    Imaging Review No results found.   EKG Interpretation None      MDM   Final diagnoses:  Vertigo    No gross neurological deficits. Suspect uncomplicated positional vertigo. Patient feels better after Antivert 25 mg. Low potassium and mildly elevated glucose discussed. Discharge medications Antivert 25 mg. Patient has primary care follow-up.    Nat Christen, MD 05/28/15 1141

## 2015-05-28 NOTE — ED Notes (Signed)
MD at bedside. 

## 2015-06-05 ENCOUNTER — Telehealth: Payer: Self-pay | Admitting: Internal Medicine

## 2015-06-05 NOTE — Telephone Encounter (Signed)
Forms completed and sent down on dumbwaiter. Called Elmyra Ricks and made her aware. Nothing further needed.

## 2015-06-05 NOTE — Telephone Encounter (Signed)
Janice Martin has forms. Will send down once done.

## 2015-06-05 NOTE — Telephone Encounter (Signed)
Forms given to MR. Once received will send to medical records and update chart.

## 2015-06-11 ENCOUNTER — Other Ambulatory Visit (HOSPITAL_BASED_OUTPATIENT_CLINIC_OR_DEPARTMENT_OTHER): Payer: 59

## 2015-06-11 DIAGNOSIS — D509 Iron deficiency anemia, unspecified: Secondary | ICD-10-CM

## 2015-06-11 DIAGNOSIS — D693 Immune thrombocytopenic purpura: Secondary | ICD-10-CM

## 2015-06-11 LAB — CBC WITH DIFFERENTIAL/PLATELET
BASO%: 0.6 % (ref 0.0–2.0)
BASOS ABS: 0.1 10*3/uL (ref 0.0–0.1)
EOS%: 7.2 % — AB (ref 0.0–7.0)
Eosinophils Absolute: 0.8 10*3/uL — ABNORMAL HIGH (ref 0.0–0.5)
HCT: 38.6 % (ref 34.8–46.6)
HGB: 12.9 g/dL (ref 11.6–15.9)
LYMPH%: 32.9 % (ref 14.0–49.7)
MCH: 30.9 pg (ref 25.1–34.0)
MCHC: 33.4 g/dL (ref 31.5–36.0)
MCV: 92.6 fL (ref 79.5–101.0)
MONO#: 0.9 10*3/uL (ref 0.1–0.9)
MONO%: 8.5 % (ref 0.0–14.0)
NEUT#: 5.3 10*3/uL (ref 1.5–6.5)
NEUT%: 50.8 % (ref 38.4–76.8)
NRBC: 0 % (ref 0–0)
Platelets: 160 10*3/uL (ref 145–400)
RBC: 4.17 10*6/uL (ref 3.70–5.45)
RDW: 14.4 % (ref 11.2–14.5)
WBC: 10.4 10*3/uL — ABNORMAL HIGH (ref 3.9–10.3)
lymph#: 3.4 10*3/uL — ABNORMAL HIGH (ref 0.9–3.3)

## 2015-07-02 ENCOUNTER — Encounter: Payer: 59 | Admitting: Internal Medicine

## 2015-07-04 ENCOUNTER — Telehealth: Payer: Self-pay | Admitting: *Deleted

## 2015-07-04 ENCOUNTER — Other Ambulatory Visit (HOSPITAL_BASED_OUTPATIENT_CLINIC_OR_DEPARTMENT_OTHER): Payer: 59

## 2015-07-04 ENCOUNTER — Encounter: Payer: Self-pay | Admitting: *Deleted

## 2015-07-04 DIAGNOSIS — D509 Iron deficiency anemia, unspecified: Secondary | ICD-10-CM

## 2015-07-04 DIAGNOSIS — D693 Immune thrombocytopenic purpura: Secondary | ICD-10-CM

## 2015-07-04 LAB — CBC WITH DIFFERENTIAL/PLATELET
BASO%: 1.2 % (ref 0.0–2.0)
Basophils Absolute: 0.1 10*3/uL (ref 0.0–0.1)
EOS%: 7.9 % — AB (ref 0.0–7.0)
Eosinophils Absolute: 0.6 10*3/uL — ABNORMAL HIGH (ref 0.0–0.5)
HEMATOCRIT: 38.6 % (ref 34.8–46.6)
HEMOGLOBIN: 13.3 g/dL (ref 11.6–15.9)
LYMPH#: 2.5 10*3/uL (ref 0.9–3.3)
LYMPH%: 33.7 % (ref 14.0–49.7)
MCH: 31.2 pg (ref 25.1–34.0)
MCHC: 34.5 g/dL (ref 31.5–36.0)
MCV: 90.6 fL (ref 79.5–101.0)
MONO#: 0.8 10*3/uL (ref 0.1–0.9)
MONO%: 10.7 % (ref 0.0–14.0)
NEUT#: 3.4 10*3/uL (ref 1.5–6.5)
NEUT%: 46.5 % (ref 38.4–76.8)
NRBC: 0 % (ref 0–0)
Platelets: 119 10*3/uL — ABNORMAL LOW (ref 145–400)
RBC: 4.26 10*6/uL (ref 3.70–5.45)
RDW: 13.9 % (ref 11.2–14.5)
WBC: 7.4 10*3/uL (ref 3.9–10.3)

## 2015-07-04 MED ORDER — DEXAMETHASONE 4 MG PO TABS: ORAL_TABLET | ORAL | Status: DC

## 2015-07-04 NOTE — Progress Notes (Signed)
Medical Records routed to Dr Velna Hatchet per patient request.

## 2015-07-04 NOTE — Telephone Encounter (Signed)
Dr Marin Olp wants patient to know that her platelets have dropped slightly, and he wants her to begin taking her decadron on the following schedule:  Decadron 4mg  tablets Take 5 tablets daily for 4 days Take 3 tablets daily for 2 days Take 1 tablet daily for 1 day  Janice Martin is aware of platelet count and medication regimen. Prescription sent to pharmacy.

## 2015-07-09 ENCOUNTER — Ambulatory Visit (HOSPITAL_BASED_OUTPATIENT_CLINIC_OR_DEPARTMENT_OTHER)
Admission: RE | Admit: 2015-07-09 | Discharge: 2015-07-09 | Disposition: A | Discharge status: Home / Self Care | Payer: 59 | Source: Ambulatory Visit | Attending: Hematology & Oncology | Admitting: Hematology & Oncology

## 2015-07-09 ENCOUNTER — Ambulatory Visit (HOSPITAL_BASED_OUTPATIENT_CLINIC_OR_DEPARTMENT_OTHER): Payer: 59 | Admitting: Hematology & Oncology

## 2015-07-09 ENCOUNTER — Encounter: Payer: Self-pay | Admitting: Hematology & Oncology

## 2015-07-09 ENCOUNTER — Encounter (HOSPITAL_BASED_OUTPATIENT_CLINIC_OR_DEPARTMENT_OTHER): Payer: Self-pay

## 2015-07-09 VITALS — BP 188/99 | HR 61 | Temp 97.9°F | Resp 16 | Ht 64.0 in | Wt 190.0 lb

## 2015-07-09 DIAGNOSIS — D693 Immune thrombocytopenic purpura: Secondary | ICD-10-CM | POA: Diagnosis not present

## 2015-07-09 DIAGNOSIS — Z86711 Personal history of pulmonary embolism: Secondary | ICD-10-CM | POA: Insufficient documentation

## 2015-07-09 DIAGNOSIS — I82401 Acute embolism and thrombosis of unspecified deep veins of right lower extremity: Secondary | ICD-10-CM | POA: Diagnosis not present

## 2015-07-09 DIAGNOSIS — Z7901 Long term (current) use of anticoagulants: Secondary | ICD-10-CM | POA: Diagnosis not present

## 2015-07-09 DIAGNOSIS — I2692 Saddle embolus of pulmonary artery without acute cor pulmonale: Secondary | ICD-10-CM

## 2015-07-09 DIAGNOSIS — I2699 Other pulmonary embolism without acute cor pulmonale: Secondary | ICD-10-CM | POA: Diagnosis not present

## 2015-07-09 DIAGNOSIS — I82402 Acute embolism and thrombosis of unspecified deep veins of left lower extremity: Secondary | ICD-10-CM

## 2015-07-09 MED ORDER — IOHEXOL 350 MG/ML SOLN
100.0000 mL | Freq: Once | INTRAVENOUS | Status: AC | PRN
  Administered 2015-07-09: 100 mL via INTRAVENOUS

## 2015-07-09 NOTE — Progress Notes (Signed)
Hematology and Oncology Follow Up Visit  Janice Martin [id number] Mar 27, 1961 54 y.o. 07/09/2015   Principle Diagnosis:   Chronic immune thrombocytopenia-recurrent  Submassive pulmonary embolism, with right lower extremity thrombus  Current Therapy:    Xarelto 20 mg by mouth daily     Interim History:  [name] is back for followup. She is doing quite well. She's has had no problems with the Xarelto. She's had no bleeding. She's had no chest wall pain. There's been no shortness of breath.  We did go ahead and repeat a CT angiogram of her chest. This is done today. Thank you, there is no evidence of emboli in her lungs.  Her platelet count has been trending downward. When this happens, we get her back on steroids. Decadron was works for her. As such, she is on Decadron taper right now.  She's had no leg swelling. There's been no leg pain. She is trying to stay active.  Overall, her performance status is ECOG 1.  Medications:  Current outpatient prescriptions:  .  dexamethasone (DECADRON) 4 MG tablet, Please take 5 tablets daily x 4 days, then 3 tablets daily x 2 days, then 1 tablet daily x 1 day, Disp: 30 tablet, Rfl: 0 .  HYDROcodone-acetaminophen (NORCO/VICODIN) 5-325 MG per tablet, Take 1 tablet by mouth every 4 (four) hours as needed for severe pain., Disp: 11 tablet, Rfl: 0 .  nebivolol (BYSTOLIC) 5 MG tablet, Take 1 tablet (5 mg total) by mouth daily. (Patient taking differently: Take 5 mg by mouth daily after supper. ), Disp: 90 tablet, Rfl: 3 .  pantoprazole (PROTONIX) 40 MG tablet, Take 1 tablet (40 mg total) by mouth daily., Disp: 30 tablet, Rfl: 1 .  rivaroxaban (XARELTO) 20 MG TABS tablet, Take 1 tablet (20 mg total) by mouth daily with supper., Disp: 30 tablet, Rfl: 5 .  valsartan-hydrochlorothiazide (DIOVAN-HCT) 320-25 MG per tablet, Take 1 tablet by mouth daily., Disp: 90 tablet, Rfl: 3 .  vitamin B-12 (CYANOCOBALAMIN) 100 MCG tablet, Take 100 mcg by mouth daily.,  Disp: , Rfl:   Allergies:  Allergies  Allergen Reactions  . Shellfish Allergy Anaphylaxis  . Influenza Vaccines     Per pt can't take mess up with her immune system  . Morphine Other (See Comments)    Burns really bad    Past Medical History, Surgical history, Social history, and Family History were reviewed and updated.  Review of Systems: As above  Physical Exam:  height is 5\' 4"  (1.626 m) and weight is 190 lb (86.183 kg). Her oral temperature is 97.9 F (36.6 C). Her blood pressure is 188/99 and her pulse is 61. Her respiration is 16.   Well-developed and well-nourished white female. Head and neck exam shows no ocular or oral lesions. There is no adenopathy of the neck. Lungs are clear bilaterally. She has no rales, wheezes or rhonchi. Cardiac exam is regular rate and rhythm with no murmurs rubs or bruits. Abdomen has a well-healed splenectomy scar. She has no fluid wave. There is no palpable liver edge. Back exam no tenderness over the spine ribs or hips. Extremities no clubbing cyanosis or edema. I cannot palpate any venous cord in the right leg. She has a negative Homans sign in the right leg. She has good pulses in her distal extremities. Skin exam shows no rashes ecchymoses or petechia. Neurological exam is nonfocal.  Lab Results  Component Value Date   WBC 7.4 07/04/2015   HGB 13.3 07/04/2015  HCT 38.6 07/04/2015   MCV 90.6 07/04/2015   PLT 119* 07/04/2015     Chemistry      Component Value Date/Time   NA 136 05/28/2015 0922   NA 138 05/08/2015 0808   K 3.1* 05/28/2015 0922   K 3.7 05/08/2015 0808   CL 99* 05/28/2015 0922   CL 100 05/08/2015 0808   CO2 30 05/28/2015 0922   CO2 29 05/08/2015 0808   BUN 8 05/28/2015 0922   BUN 9 05/08/2015 0808   CREATININE 1.10* 05/28/2015 0922   CREATININE 0.8 05/08/2015 0808      Component Value Date/Time   CALCIUM 9.3 05/28/2015 0922   CALCIUM 9.5 05/08/2015 0808   ALKPHOS 77 05/14/2015 1248   ALKPHOS 74 05/08/2015 0808    AST 16 05/14/2015 1248   AST 24 05/08/2015 0808   ALT 23 05/14/2015 1248   ALT 29 05/08/2015 0808   BILITOT 0.4 05/14/2015 1248   BILITOT 0.70 05/08/2015 0808       Impression and Plan: Janice Martin is a 54 year old white female. She has a history of relapsed chronic immune thrombocytopenia. We treated her with Rituxan. She responded very well. She got treated back in October of 2014.  She will need lifelong anticoagulation.  I let get her CT scan does not show any evidence of emboli. She will stay on the Xarelto.  We will continue to follow her platelets weekly. She will have this done at [facility].  I will see her back in 6 weeks.    [name], MD 9/19/201610:55 AM

## 2015-07-16 ENCOUNTER — Other Ambulatory Visit (HOSPITAL_BASED_OUTPATIENT_CLINIC_OR_DEPARTMENT_OTHER): Payer: 59

## 2015-07-16 DIAGNOSIS — I82402 Acute embolism and thrombosis of unspecified deep veins of left lower extremity: Secondary | ICD-10-CM

## 2015-07-16 DIAGNOSIS — D693 Immune thrombocytopenic purpura: Secondary | ICD-10-CM | POA: Diagnosis not present

## 2015-07-16 DIAGNOSIS — I2692 Saddle embolus of pulmonary artery without acute cor pulmonale: Secondary | ICD-10-CM

## 2015-07-16 LAB — CBC WITH DIFFERENTIAL/PLATELET
BASO%: 1 % (ref 0.0–2.0)
Basophils Absolute: 0.1 10*3/uL (ref 0.0–0.1)
EOS%: 4 % (ref 0.0–7.0)
Eosinophils Absolute: 0.5 10*3/uL (ref 0.0–0.5)
HEMATOCRIT: 40.6 % (ref 34.8–46.6)
HEMOGLOBIN: 13.8 g/dL (ref 11.6–15.9)
LYMPH%: 23 % (ref 14.0–49.7)
MCH: 31.7 pg (ref 25.1–34.0)
MCHC: 34 g/dL (ref 31.5–36.0)
MCV: 93.4 fL (ref 79.5–101.0)
MONO#: 1.1 10*3/uL — AB (ref 0.1–0.9)
MONO%: 8.7 % (ref 0.0–14.0)
NEUT%: 63.3 % (ref 38.4–76.8)
NEUTROS ABS: 8.2 10*3/uL — AB (ref 1.5–6.5)
PLATELETS: 183 10*3/uL (ref 145–400)
RBC: 4.34 10*6/uL (ref 3.70–5.45)
RDW: 14.6 % — AB (ref 11.2–14.5)
WBC: 12.9 10*3/uL — AB (ref 3.9–10.3)
lymph#: 3 10*3/uL (ref 0.9–3.3)

## 2015-07-18 ENCOUNTER — Telehealth: Payer: Self-pay | Admitting: Internal Medicine

## 2015-07-18 NOTE — Telephone Encounter (Signed)
[id number] x1 

## 2015-07-19 NOTE — Telephone Encounter (Signed)
PATIENT IS IN LOBBY with [employer] paperwork and wants to discuss as it was denied!

## 2015-07-19 NOTE — Telephone Encounter (Signed)
Spoke with the pt and notified of recs per MR  She verbalized understanding  She will have Dr Marin Olp fill out her FMLA forms  Nothing further needed

## 2015-07-19 NOTE — Telephone Encounter (Signed)
Spoke with the pt  She states FMLA forms were denied due to them being incomplete  She states forms were faxed back to [place] on 06/21/15  Please advise status of these thanks

## 2015-07-19 NOTE — Telephone Encounter (Signed)
Janice Martin, have you seen forms?

## 2015-07-19 NOTE — Telephone Encounter (Signed)
Please ask Daneil Dan where teh forms are> not sure what part was incomplete. She needs FMLA more from ITP (Dr Marin Olp) than PE (me) but I am happy to fil out again. Though might be better off asking Dr Marin Olp as well if she wants because I think she works with him too

## 2015-07-23 ENCOUNTER — Other Ambulatory Visit (HOSPITAL_BASED_OUTPATIENT_CLINIC_OR_DEPARTMENT_OTHER): Payer: 59

## 2015-07-23 DIAGNOSIS — Z86718 Personal history of other venous thrombosis and embolism: Secondary | ICD-10-CM | POA: Diagnosis not present

## 2015-07-23 DIAGNOSIS — I2692 Saddle embolus of pulmonary artery without acute cor pulmonale: Secondary | ICD-10-CM

## 2015-07-23 DIAGNOSIS — Z86711 Personal history of pulmonary embolism: Secondary | ICD-10-CM

## 2015-07-23 DIAGNOSIS — D693 Immune thrombocytopenic purpura: Secondary | ICD-10-CM | POA: Diagnosis not present

## 2015-07-23 LAB — COMPREHENSIVE METABOLIC PANEL (CC13)
ALT: 25 U/L (ref 0–55)
AST: 15 U/L (ref 5–34)
Albumin: 3.7 g/dL (ref 3.5–5.0)
Alkaline Phosphatase: 101 U/L (ref 40–150)
Anion Gap: 7 mEq/L (ref 3–11)
BUN: 9.8 mg/dL (ref 7.0–26.0)
CO2: 30 meq/L — AB (ref 22–29)
Calcium: 9.5 mg/dL (ref 8.4–10.4)
Chloride: 104 mEq/L (ref 98–109)
Creatinine: 1 mg/dL (ref 0.6–1.1)
EGFR: 65 mL/min/{1.73_m2} — AB (ref >90)
GLUCOSE: 102 mg/dL (ref 70–140)
POTASSIUM: 3.4 meq/L — AB (ref 3.5–5.1)
SODIUM: 141 meq/L (ref 136–145)
TOTAL PROTEIN: 6.9 g/dL (ref 6.4–8.3)

## 2015-07-23 LAB — CBC WITH DIFFERENTIAL/PLATELET
BASO%: 0.7 % (ref 0.0–2.0)
Basophils Absolute: 0.1 10*3/uL (ref 0.0–0.1)
EOS%: 3.8 % (ref 0.0–7.0)
Eosinophils Absolute: 0.4 10*3/uL (ref 0.0–0.5)
HEMATOCRIT: 39.5 % (ref 34.8–46.6)
HEMOGLOBIN: 13.4 g/dL (ref 11.6–15.9)
LYMPH#: 3.6 10*3/uL — AB (ref 0.9–3.3)
LYMPH%: 34.6 % (ref 14.0–49.7)
MCH: 31.4 pg (ref 25.1–34.0)
MCHC: 33.9 g/dL (ref 31.5–36.0)
MCV: 92.5 fL (ref 79.5–101.0)
MONO#: 0.8 10*3/uL (ref 0.1–0.9)
MONO%: 7.9 % (ref 0.0–14.0)
NEUT%: 53 % (ref 38.4–76.8)
NEUTROS ABS: 5.6 10*3/uL (ref 1.5–6.5)
Platelets: 188 10*3/uL (ref 145–400)
RBC: 4.27 10*6/uL (ref 3.70–5.45)
RDW: 14.4 % (ref 11.2–14.5)
WBC: 10.5 10*3/uL — ABNORMAL HIGH (ref 3.9–10.3)

## 2015-07-23 LAB — CHCC SATELLITE - SMEAR

## 2015-07-26 LAB — PROTEIN C, TOTAL: Protein C Antigen: 110 % (ref 70–140)

## 2015-07-26 LAB — PROTEIN C ACTIVITY: Protein C Activity: >200 % — ABNORMAL HIGH (ref 70–180)

## 2015-07-30 ENCOUNTER — Other Ambulatory Visit (HOSPITAL_BASED_OUTPATIENT_CLINIC_OR_DEPARTMENT_OTHER): Payer: 59

## 2015-07-30 DIAGNOSIS — I2692 Saddle embolus of pulmonary artery without acute cor pulmonale: Secondary | ICD-10-CM

## 2015-07-30 DIAGNOSIS — I82402 Acute embolism and thrombosis of unspecified deep veins of left lower extremity: Secondary | ICD-10-CM

## 2015-07-30 DIAGNOSIS — D693 Immune thrombocytopenic purpura: Secondary | ICD-10-CM

## 2015-07-30 LAB — CBC WITH DIFFERENTIAL/PLATELET
BASO%: 1.1 % (ref 0.0–2.0)
BASOS ABS: 0.1 10*3/uL (ref 0.0–0.1)
EOS%: 4.8 % (ref 0.0–7.0)
Eosinophils Absolute: 0.5 10*3/uL (ref 0.0–0.5)
HCT: 40.8 % (ref 34.8–46.6)
HEMOGLOBIN: 13.8 g/dL (ref 11.6–15.9)
LYMPH#: 2.9 10*3/uL (ref 0.9–3.3)
LYMPH%: 30.9 % (ref 14.0–49.7)
MCH: 31.2 pg (ref 25.1–34.0)
MCHC: 33.8 g/dL (ref 31.5–36.0)
MCV: 92.1 fL (ref 79.5–101.0)
MONO#: 0.9 10*3/uL (ref 0.1–0.9)
MONO%: 9.1 % (ref 0.0–14.0)
NEUT%: 54.1 % (ref 38.4–76.8)
NEUTROS ABS: 5.1 10*3/uL (ref 1.5–6.5)
NRBC: 0 % (ref 0–0)
Platelets: 252 10*3/uL (ref 145–400)
RBC: 4.43 10*6/uL (ref 3.70–5.45)
RDW: 14.2 % (ref 11.2–14.5)
WBC: 9.4 10*3/uL (ref 3.9–10.3)

## 2015-08-06 ENCOUNTER — Ambulatory Visit (HOSPITAL_BASED_OUTPATIENT_CLINIC_OR_DEPARTMENT_OTHER): Payer: 59

## 2015-08-06 DIAGNOSIS — D693 Immune thrombocytopenic purpura: Secondary | ICD-10-CM

## 2015-08-06 DIAGNOSIS — I2699 Other pulmonary embolism without acute cor pulmonale: Secondary | ICD-10-CM

## 2015-08-06 DIAGNOSIS — I82402 Acute embolism and thrombosis of unspecified deep veins of left lower extremity: Secondary | ICD-10-CM

## 2015-08-06 DIAGNOSIS — I2692 Saddle embolus of pulmonary artery without acute cor pulmonale: Secondary | ICD-10-CM

## 2015-08-06 LAB — CBC WITH DIFFERENTIAL/PLATELET
BASO%: 1 % (ref 0.0–2.0)
Basophils Absolute: 0.1 10*3/uL (ref 0.0–0.1)
EOS ABS: 0.5 10*3/uL (ref 0.0–0.5)
EOS%: 4.7 % (ref 0.0–7.0)
HCT: 40.1 % (ref 34.8–46.6)
HGB: 13.9 g/dL (ref 11.6–15.9)
LYMPH#: 2.6 10*3/uL (ref 0.9–3.3)
LYMPH%: 26.5 % (ref 14.0–49.7)
MCH: 31.6 pg (ref 25.1–34.0)
MCHC: 34.7 g/dL (ref 31.5–36.0)
MCV: 91.1 fL (ref 79.5–101.0)
MONO#: 0.9 10*3/uL (ref 0.1–0.9)
MONO%: 9.1 % (ref 0.0–14.0)
NEUT%: 58.7 % (ref 38.4–76.8)
NEUTROS ABS: 5.8 10*3/uL (ref 1.5–6.5)
NRBC: 0 % (ref 0–0)
PLATELETS: 299 10*3/uL (ref 145–400)
RBC: 4.4 10*6/uL (ref 3.70–5.45)
RDW: 14.3 % (ref 11.2–14.5)
WBC: 9.8 10*3/uL (ref 3.9–10.3)

## 2015-08-13 ENCOUNTER — Other Ambulatory Visit: Payer: 59

## 2015-08-13 DIAGNOSIS — I2692 Saddle embolus of pulmonary artery without acute cor pulmonale: Secondary | ICD-10-CM

## 2015-08-13 DIAGNOSIS — D693 Immune thrombocytopenic purpura: Secondary | ICD-10-CM

## 2015-08-13 DIAGNOSIS — I2699 Other pulmonary embolism without acute cor pulmonale: Secondary | ICD-10-CM | POA: Diagnosis not present

## 2015-08-13 DIAGNOSIS — I82402 Acute embolism and thrombosis of unspecified deep veins of left lower extremity: Secondary | ICD-10-CM

## 2015-08-13 LAB — CBC WITH DIFFERENTIAL/PLATELET
BASO%: 1.2 % (ref 0.0–2.0)
Basophils Absolute: 0.1 10*3/uL (ref 0.0–0.1)
EOS%: 5.8 % (ref 0.0–7.0)
Eosinophils Absolute: 0.6 10*3/uL — ABNORMAL HIGH (ref 0.0–0.5)
HEMATOCRIT: 40.8 % (ref 34.8–46.6)
HEMOGLOBIN: 13.9 g/dL (ref 11.6–15.9)
LYMPH#: 2.9 10*3/uL (ref 0.9–3.3)
LYMPH%: 30 % (ref 14.0–49.7)
MCH: 31.4 pg (ref 25.1–34.0)
MCHC: 34.1 g/dL (ref 31.5–36.0)
MCV: 92.3 fL (ref 79.5–101.0)
MONO#: 0.8 10*3/uL (ref 0.1–0.9)
MONO%: 8.6 % (ref 0.0–14.0)
NEUT%: 54.4 % (ref 38.4–76.8)
NEUTROS ABS: 5.2 10*3/uL (ref 1.5–6.5)
NRBC: 0 % (ref 0–0)
Platelets: 270 10*3/uL (ref 145–400)
RBC: 4.42 10*6/uL (ref 3.70–5.45)
RDW: 14.2 % (ref 11.2–14.5)
WBC: 9.6 10*3/uL (ref 3.9–10.3)

## 2015-08-20 ENCOUNTER — Ambulatory Visit (HOSPITAL_BASED_OUTPATIENT_CLINIC_OR_DEPARTMENT_OTHER): Payer: 59 | Admitting: Hematology & Oncology

## 2015-08-20 ENCOUNTER — Other Ambulatory Visit (HOSPITAL_BASED_OUTPATIENT_CLINIC_OR_DEPARTMENT_OTHER): Payer: 59

## 2015-08-20 ENCOUNTER — Encounter: Payer: Self-pay | Admitting: Hematology & Oncology

## 2015-08-20 VITALS — BP 126/76 | HR 79 | Temp 98.9°F | Resp 16 | Ht 64.0 in | Wt 190.0 lb

## 2015-08-20 DIAGNOSIS — I82402 Acute embolism and thrombosis of unspecified deep veins of left lower extremity: Secondary | ICD-10-CM

## 2015-08-20 DIAGNOSIS — Z7901 Long term (current) use of anticoagulants: Secondary | ICD-10-CM | POA: Diagnosis not present

## 2015-08-20 DIAGNOSIS — D693 Immune thrombocytopenic purpura: Secondary | ICD-10-CM

## 2015-08-20 DIAGNOSIS — I2699 Other pulmonary embolism without acute cor pulmonale: Secondary | ICD-10-CM | POA: Diagnosis not present

## 2015-08-20 DIAGNOSIS — I2692 Saddle embolus of pulmonary artery without acute cor pulmonale: Secondary | ICD-10-CM

## 2015-08-20 LAB — CBC WITH DIFFERENTIAL (CANCER CENTER ONLY)
BASO#: 0.1 10*3/uL (ref 0.0–0.2)
BASO%: 0.7 % (ref 0.0–2.0)
EOS ABS: 1 10*3/uL — AB (ref 0.0–0.5)
EOS%: 8.3 % — AB (ref 0.0–7.0)
HCT: 39.2 % (ref 34.8–46.6)
HEMOGLOBIN: 13.4 g/dL (ref 11.6–15.9)
LYMPH#: 3.9 10*3/uL — AB (ref 0.9–3.3)
LYMPH%: 33.7 % (ref 14.0–48.0)
MCH: 31.2 pg (ref 26.0–34.0)
MCHC: 34.2 g/dL (ref 32.0–36.0)
MCV: 91 fL (ref 81–101)
MONO#: 1 10*3/uL — AB (ref 0.1–0.9)
MONO%: 8.9 % (ref 0.0–13.0)
NEUT#: 5.6 10*3/uL (ref 1.5–6.5)
NEUT%: 48.4 % (ref 39.6–80.0)
PLATELETS: 221 10*3/uL (ref 145–400)
RBC: 4.3 10*6/uL (ref 3.70–5.32)
RDW: 14.3 % (ref 11.1–15.7)
WBC: 11.5 10*3/uL — AB (ref 3.9–10.0)

## 2015-08-20 LAB — CMP (CANCER CENTER ONLY)
ALBUMIN: 3.5 g/dL (ref 3.3–5.5)
ALK PHOS: 77 U/L (ref 26–84)
ALT(SGPT): 24 U/L (ref 10–47)
AST: 22 U/L (ref 11–38)
BILIRUBIN TOTAL: 0.6 mg/dL (ref 0.20–1.60)
BUN, Bld: 10 mg/dL (ref 7–22)
CALCIUM: 8.9 mg/dL (ref 8.0–10.3)
CO2: 27 meq/L (ref 18–33)
Chloride: 97 mEq/L — ABNORMAL LOW (ref 98–108)
Creat: 0.8 mg/dl (ref 0.6–1.2)
GLUCOSE: 103 mg/dL (ref 73–118)
POTASSIUM: 3.7 meq/L (ref 3.3–4.7)
Sodium: 141 mEq/L (ref 128–145)
Total Protein: 6.8 g/dL (ref 6.4–8.1)

## 2015-08-20 LAB — CHCC SATELLITE - SMEAR

## 2015-08-20 NOTE — Progress Notes (Signed)
Hematology and Oncology Follow Up Visit  TELESHIA LEMERE [id number] July 26, 1961 54 y.o. 08/20/2015   Principle Diagnosis:   Chronic immune thrombocytopenia-recurrent  Submassive pulmonary embolism, with right lower extremity thrombus  Current Therapy:    Xarelto 20 mg by mouth daily     Interim History:  Ms.  Funderburk is back for followup. She is doing quite well. She's has had no problems with the Xarelto. She's had no bleeding. She's had no chest wall pain. There's been no shortness of breath.   she's had no problems with abdominal pain. She's had no issues with bowels or bladder.  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 She is enjoying this.   Overall, her performance status is ECOG 0.  Medications:  Current outpatient prescriptions:  .  nebivolol (BYSTOLIC) 5 MG tablet, Take 1 tablet (5 mg total) by mouth daily. (Patient taking differently: Take 5 mg by mouth daily after supper. ), Disp: 90 tablet, Rfl: 3 .  rivaroxaban (XARELTO) 20 MG TABS tablet, Take 1 tablet (20 mg total) by mouth daily with supper., Disp: 30 tablet, Rfl: 5 .  valsartan-hydrochlorothiazide (DIOVAN-HCT) 320-25 MG per tablet, Take 1 tablet by mouth daily., Disp: 90 tablet, Rfl: 3 .  vitamin B-12 (CYANOCOBALAMIN) 100 MCG tablet, Take 100 mcg by mouth daily., Disp: , Rfl:   Allergies:  Allergies  Allergen Reactions  . Shellfish Allergy Anaphylaxis  . Influenza Vaccines     Per pt can't take mess up with her immune system  . Morphine Other (See Comments)    Burns really bad    Past Medical History, Surgical history, Social history, and Family History were reviewed and updated.  Review of Systems: As above  Physical Exam:  height is 5\' 4"  (1.626 m) and weight is 190 lb (86.183 kg). Her oral temperature is 98.9 F (37.2 C). Her blood pressure is 126/76 and her pulse is 79. Her respiration is 16.   Well-developed and well-nourished white female. Head and neck exam shows no  ocular or oral lesions. There is no adenopathy of the neck. Lungs are clear bilaterally. She has no rales, wheezes or rhonchi. Cardiac exam is regular rate and rhythm with no murmurs rubs or bruits. Abdomen has a well-healed splenectomy scar. She has no fluid wave. There is no palpable liver edge. Back exam no tenderness over the spine ribs or hips. Extremities no clubbing cyanosis or edema. I cannot palpate any venous cord in the right leg. She has a negative Homans sign in the right leg. She has good pulses in her distal extremities. Skin exam shows no rashes ecchymoses or petechia. Neurological exam is nonfocal.  Lab Results  Component Value Date   WBC 11.5* 08/20/2015   HGB 13.4 08/20/2015   HCT 39.2 08/20/2015   MCV 91 08/20/2015   PLT 221 08/20/2015     Chemistry      Component Value Date/Time   NA 141 08/20/2015 1118   NA 141 07/23/2015 1217   NA 136 05/28/2015 0922   K 3.7 08/20/2015 1118   K 3.4* 07/23/2015 1217   K 3.1* 05/28/2015 0922   CL 97* 08/20/2015 1118   CL 99* 05/28/2015 0922   CO2 27 08/20/2015 1118   CO2 30* 07/23/2015 1217   CO2 30 05/28/2015 0922   BUN 10 08/20/2015 1118   BUN 9.8 07/23/2015 1217   BUN 8 05/28/2015 0922   CREATININE 0.8 08/20/2015 1118   CREATININE 1.0 07/23/2015 1217  CREATININE 1.10* 05/28/2015 0922      Component Value Date/Time   CALCIUM 8.9 08/20/2015 1118   CALCIUM 9.5 07/23/2015 1217   CALCIUM 9.3 05/28/2015 0922   ALKPHOS 77 08/20/2015 1118   ALKPHOS 101 07/23/2015 1217   ALKPHOS 77 05/14/2015 1248   AST 22 08/20/2015 1118   AST 15 07/23/2015 1217   AST 16 05/14/2015 1248   ALT 24 08/20/2015 1118   ALT 25 07/23/2015 1217   ALT 23 05/14/2015 1248   BILITOT 0.60 08/20/2015 1118   BILITOT <0.30 07/23/2015 1217   BILITOT 0.4 05/14/2015 1248       Impression and Plan: Ms. Sankey is a 54 year old white female. She has a history of relapsed chronic immune thrombocytopenia. We treated her with Rituxan. She responded very  well. She got treated back in October of 2014.  She will need lifelong anticoagulation.   as far as the pulmonary embolism is concerned, I really don't think that is going be much of an issue at this point. She is doing well with the Xarelto.   we will check her blood counts every 2 weeks now.  I will see her back in 6 weeks.  [name], MD 10/31/201612:13 PM

## 2015-08-21 ENCOUNTER — Other Ambulatory Visit: Payer: Self-pay | Admitting: Internal Medicine

## 2015-08-27 ENCOUNTER — Other Ambulatory Visit (HOSPITAL_BASED_OUTPATIENT_CLINIC_OR_DEPARTMENT_OTHER): Payer: 59

## 2015-08-27 DIAGNOSIS — D693 Immune thrombocytopenic purpura: Secondary | ICD-10-CM | POA: Diagnosis not present

## 2015-08-27 LAB — CBC WITH DIFFERENTIAL/PLATELET
BASO%: 0.7 % (ref 0.0–2.0)
BASOS ABS: 0.1 10*3/uL (ref 0.0–0.1)
EOS%: 10 % — AB (ref 0.0–7.0)
Eosinophils Absolute: 1.1 10*3/uL — ABNORMAL HIGH (ref 0.0–0.5)
HEMATOCRIT: 38.8 % (ref 34.8–46.6)
HEMOGLOBIN: 13.6 g/dL (ref 11.6–15.9)
LYMPH#: 3.4 10*3/uL — AB (ref 0.9–3.3)
LYMPH%: 31.6 % (ref 14.0–49.7)
MCH: 31.7 pg (ref 25.1–34.0)
MCHC: 35.1 g/dL (ref 31.5–36.0)
MCV: 90.4 fL (ref 79.5–101.0)
MONO#: 0.9 10*3/uL (ref 0.1–0.9)
MONO%: 8.5 % (ref 0.0–14.0)
NEUT%: 49.2 % (ref 38.4–76.8)
NEUTROS ABS: 5.3 10*3/uL (ref 1.5–6.5)
NRBC: 0 % (ref 0–0)
PLATELETS: 192 10*3/uL (ref 145–400)
RBC: 4.29 10*6/uL (ref 3.70–5.45)
RDW: 14.5 % (ref 11.2–14.5)
WBC: 10.8 10*3/uL — ABNORMAL HIGH (ref 3.9–10.3)

## 2015-09-03 ENCOUNTER — Other Ambulatory Visit (HOSPITAL_BASED_OUTPATIENT_CLINIC_OR_DEPARTMENT_OTHER): Payer: 59

## 2015-09-03 DIAGNOSIS — D693 Immune thrombocytopenic purpura: Secondary | ICD-10-CM

## 2015-09-03 LAB — CBC WITH DIFFERENTIAL/PLATELET
BASO%: 1 % (ref 0.0–2.0)
BASOS ABS: 0.1 10*3/uL (ref 0.0–0.1)
EOS ABS: 1 10*3/uL — AB (ref 0.0–0.5)
EOS%: 11.3 % — ABNORMAL HIGH (ref 0.0–7.0)
HEMATOCRIT: 39.4 % (ref 34.8–46.6)
HEMOGLOBIN: 13.7 g/dL (ref 11.6–15.9)
LYMPH#: 2.8 10*3/uL (ref 0.9–3.3)
LYMPH%: 30.3 % (ref 14.0–49.7)
MCH: 31.4 pg (ref 25.1–34.0)
MCHC: 34.8 g/dL (ref 31.5–36.0)
MCV: 90.4 fL (ref 79.5–101.0)
MONO#: 0.8 10*3/uL (ref 0.1–0.9)
MONO%: 8.8 % (ref 0.0–14.0)
NEUT#: 4.4 10*3/uL (ref 1.5–6.5)
NEUT%: 48.6 % (ref 38.4–76.8)
NRBC: 0 % (ref 0–0)
PLATELETS: 212 10*3/uL (ref 145–400)
RBC: 4.36 10*6/uL (ref 3.70–5.45)
RDW: 14.3 % (ref 11.2–14.5)
WBC: 9.1 10*3/uL (ref 3.9–10.3)

## 2015-09-07 ENCOUNTER — Other Ambulatory Visit: Payer: Self-pay | Admitting: Nurse Practitioner

## 2015-09-10 ENCOUNTER — Other Ambulatory Visit: Payer: 59

## 2015-09-12 ENCOUNTER — Encounter: Payer: Self-pay | Admitting: Internal Medicine

## 2015-09-17 ENCOUNTER — Other Ambulatory Visit (HOSPITAL_BASED_OUTPATIENT_CLINIC_OR_DEPARTMENT_OTHER): Payer: 59

## 2015-09-17 DIAGNOSIS — D693 Immune thrombocytopenic purpura: Secondary | ICD-10-CM

## 2015-09-17 LAB — CBC WITH DIFFERENTIAL/PLATELET
BASO%: 1.1 % (ref 0.0–2.0)
Basophils Absolute: 0.1 10*3/uL (ref 0.0–0.1)
EOS%: 7.3 % — AB (ref 0.0–7.0)
Eosinophils Absolute: 0.7 10*3/uL — ABNORMAL HIGH (ref 0.0–0.5)
HEMATOCRIT: 39.3 % (ref 34.8–46.6)
HGB: 13.6 g/dL (ref 11.6–15.9)
LYMPH%: 31.2 % (ref 14.0–49.7)
MCH: 31.3 pg (ref 25.1–34.0)
MCHC: 34.6 g/dL (ref 31.5–36.0)
MCV: 90.3 fL (ref 79.5–101.0)
MONO#: 0.9 10*3/uL (ref 0.1–0.9)
MONO%: 9 % (ref 0.0–14.0)
NEUT#: 4.9 10*3/uL (ref 1.5–6.5)
NEUT%: 51.4 % (ref 38.4–76.8)
Platelets: 218 10*3/uL (ref 145–400)
RBC: 4.35 10*6/uL (ref 3.70–5.45)
RDW: 14 % (ref 11.2–14.5)
WBC: 9.5 10*3/uL (ref 3.9–10.3)
lymph#: 3 10*3/uL (ref 0.9–3.3)
nRBC: 0 % (ref 0–0)

## 2015-09-24 ENCOUNTER — Other Ambulatory Visit: Payer: 59

## 2015-09-25 ENCOUNTER — Other Ambulatory Visit: Payer: Self-pay | Admitting: Internal Medicine

## 2015-10-01 ENCOUNTER — Other Ambulatory Visit (HOSPITAL_BASED_OUTPATIENT_CLINIC_OR_DEPARTMENT_OTHER): Payer: 59

## 2015-10-01 ENCOUNTER — Telehealth: Payer: Self-pay | Admitting: *Deleted

## 2015-10-01 DIAGNOSIS — D693 Immune thrombocytopenic purpura: Secondary | ICD-10-CM

## 2015-10-01 LAB — CBC WITH DIFFERENTIAL/PLATELET
BASO%: 1 % (ref 0.0–2.0)
BASOS ABS: 0.1 10*3/uL (ref 0.0–0.1)
EOS%: 9.4 % — AB (ref 0.0–7.0)
Eosinophils Absolute: 0.8 10*3/uL — ABNORMAL HIGH (ref 0.0–0.5)
HCT: 39 % (ref 34.8–46.6)
HGB: 13.5 g/dL (ref 11.6–15.9)
LYMPH%: 36 % (ref 14.0–49.7)
MCH: 31.3 pg (ref 25.1–34.0)
MCHC: 34.6 g/dL (ref 31.5–36.0)
MCV: 90.5 fL (ref 79.5–101.0)
MONO#: 0.8 10*3/uL (ref 0.1–0.9)
MONO%: 8.7 % (ref 0.0–14.0)
NEUT#: 4 10*3/uL (ref 1.5–6.5)
NEUT%: 44.9 % (ref 38.4–76.8)
NRBC: 0 % (ref 0–0)
PLATELETS: 166 10*3/uL (ref 145–400)
RBC: 4.31 10*6/uL (ref 3.70–5.45)
RDW: 14.1 % (ref 11.2–14.5)
WBC: 8.9 10*3/uL (ref 3.9–10.3)
lymph#: 3.2 10*3/uL (ref 0.9–3.3)

## 2015-10-01 NOTE — Telephone Encounter (Addendum)
Patient aware of results.   ----- Message from [name], MD sent at 10/01/2015 12:48 PM EST ----- Call - platelets look great!!!  pete

## 2015-10-03 ENCOUNTER — Encounter: Payer: Self-pay | Admitting: Hematology & Oncology

## 2015-10-03 ENCOUNTER — Other Ambulatory Visit (HOSPITAL_BASED_OUTPATIENT_CLINIC_OR_DEPARTMENT_OTHER): Payer: 59

## 2015-10-03 ENCOUNTER — Ambulatory Visit (HOSPITAL_BASED_OUTPATIENT_CLINIC_OR_DEPARTMENT_OTHER): Payer: 59 | Admitting: Hematology & Oncology

## 2015-10-03 VITALS — BP 127/68 | HR 71 | Temp 97.7°F | Resp 14 | Ht 64.0 in | Wt 182.0 lb

## 2015-10-03 DIAGNOSIS — D693 Immune thrombocytopenic purpura: Secondary | ICD-10-CM

## 2015-10-03 DIAGNOSIS — D509 Iron deficiency anemia, unspecified: Secondary | ICD-10-CM

## 2015-10-03 DIAGNOSIS — I82409 Acute embolism and thrombosis of unspecified deep veins of unspecified lower extremity: Secondary | ICD-10-CM | POA: Diagnosis not present

## 2015-10-03 LAB — CBC WITH DIFFERENTIAL (CANCER CENTER ONLY)
BASO#: 0.1 10*3/uL (ref 0.0–0.2)
BASO%: 0.8 % (ref 0.0–2.0)
EOS%: 8.3 % — ABNORMAL HIGH (ref 0.0–7.0)
Eosinophils Absolute: 0.7 10*3/uL — ABNORMAL HIGH (ref 0.0–0.5)
HCT: 39.5 % (ref 34.8–46.6)
HGB: 13.5 g/dL (ref 11.6–15.9)
LYMPH#: 2.6 10*3/uL (ref 0.9–3.3)
LYMPH%: 31 % (ref 14.0–48.0)
MCH: 31.2 pg (ref 26.0–34.0)
MCHC: 34.2 g/dL (ref 32.0–36.0)
MCV: 91 fL (ref 81–101)
MONO#: 0.9 10*3/uL (ref 0.1–0.9)
MONO%: 10.8 % (ref 0.0–13.0)
NEUT#: 4.1 10*3/uL (ref 1.5–6.5)
NEUT%: 49.1 % (ref 39.6–80.0)
PLATELETS: 164 10*3/uL (ref 145–400)
RBC: 4.33 10*6/uL (ref 3.70–5.32)
RDW: 13.8 % (ref 11.1–15.7)
WBC: 8.4 10*3/uL (ref 3.9–10.0)

## 2015-10-03 MED ORDER — RIVAROXABAN 20 MG PO TABS: 20.0000 mg | ORAL_TABLET | Freq: Every day | ORAL | Status: DC

## 2015-10-03 NOTE — Addendum Note (Signed)
Addended by: Burney Gauze R on: 10/03/2015 10:07 AM   Modules accepted: Orders

## 2015-10-03 NOTE — Progress Notes (Signed)
Hematology and Oncology Follow Up Visit  Janice Martin [id number] 1961-03-06 54 y.o. 10/03/2015   Principle Diagnosis:   Chronic immune thrombocytopenia-recurrent  Submassive pulmonary embolism, with right lower extremity thrombus  Current Therapy:    Xarelto 20 mg by mouth daily     Interim History:  [name] is back for followup. She continues to do well. 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  She is doing well with respect to the Xarelto. She's had a problem with bleeding. She's had no cough or shortness of breath.  There's not been any problems with nausea or vomiting. She's had no change in bowel or bladder habits.  She is looking forward to the holidays.  She's had no problems with fever.  Overall, performance status is ECOG 1.   Medications:  Current outpatient prescriptions:  .  nebivolol (BYSTOLIC) 5 MG tablet, Take 1 tablet (5 mg total) by mouth daily., Disp: 30 tablet, Rfl: 0 .  rivaroxaban (XARELTO) 20 MG TABS tablet, Take 1 tablet (20 mg total) by mouth daily with supper., Disp: 30 tablet, Rfl: 5 .  valsartan-hydrochlorothiazide (DIOVAN-HCT) 320-25 MG tablet, TAKE 1 TABLET BY MOUTH ONCE DAILY, Disp: 30 tablet, Rfl: 0 .  vitamin B-12 (CYANOCOBALAMIN) 100 MCG tablet, Take 100 mcg by mouth daily., Disp: , Rfl:   Allergies:  Allergies  Allergen Reactions  . Shellfish Allergy Anaphylaxis  . Influenza Vaccines     Per pt can't take mess up with her immune system  . Morphine Other (See Comments)    Burns really bad    Past Medical History, Surgical history, Social history, and Family History were reviewed and updated.  Review of Systems: As above  Physical Exam:  height is 5\' 4"  (1.626 m) and weight is 182 lb (82.555 kg). Her oral temperature is 97.7 F (36.5 C). Her blood pressure is 127/68 and her pulse is 71. Her respiration is 14.   Well-developed and well-nourished  white female. Head and neck exam shows no ocular or oral lesions. There is no adenopathy of the neck. Lungs are clear bilaterally. She has no rales, wheezes or rhonchi. Cardiac exam is regular rate and rhythm with no murmurs rubs or bruits. Abdomen has a well-healed splenectomy scar. She has no fluid wave. There is no palpable liver edge. Back exam no tenderness over the spine ribs or hips. Extremities no clubbing cyanosis or edema. I cannot palpate any venous cord in the right leg. She has a negative Homans sign in the right leg. She has good pulses in her distal extremities. Skin exam shows no rashes ecchymoses or petechia. Neurological exam is nonfocal.  Lab Results  Component Value Date   WBC 8.4 10/03/2015   HGB 13.5 10/03/2015   HCT 39.5 10/03/2015   MCV 91 10/03/2015   PLT 164 10/03/2015     Chemistry      Component Value Date/Time   NA 141 08/20/2015 1118   NA 141 07/23/2015 1217   NA 136 05/28/2015 0922   K 3.7 08/20/2015 1118   K 3.4* 07/23/2015 1217   K 3.1* 05/28/2015 0922   CL 97* 08/20/2015 1118   CL 99* 05/28/2015 0922   CO2 27 08/20/2015 1118   CO2 30* 07/23/2015 1217   CO2 30 05/28/2015 0922   BUN 10 08/20/2015 1118   BUN 9.8 07/23/2015 1217   BUN 8 05/28/2015 0922   CREATININE 0.8  08/20/2015 1118   CREATININE 1.0 07/23/2015 1217   CREATININE 1.10* 05/28/2015 0922      Component Value Date/Time   CALCIUM 8.9 08/20/2015 1118   CALCIUM 9.5 07/23/2015 1217   CALCIUM 9.3 05/28/2015 0922   ALKPHOS 77 08/20/2015 1118   ALKPHOS 101 07/23/2015 1217   ALKPHOS 77 05/14/2015 1248   AST 22 08/20/2015 1118   AST 15 07/23/2015 1217   AST 16 05/14/2015 1248   ALT 24 08/20/2015 1118   ALT 25 07/23/2015 1217   ALT 23 05/14/2015 1248   BILITOT 0.60 08/20/2015 1118   BILITOT <0.30 07/23/2015 1217   BILITOT 0.4 05/14/2015 1248       Impression and Plan: Janice Martin is a 54 year old white female. She has a history of relapsed chronic immune thrombocytopenia. We treated  her with Rituxan. She responded very well. She got treated back in October of 2014.  She will need lifelong anticoagulation.   As far as the pulmonary embolism is concerned, I really don't think that is going be much of an issue at this point. She is doing well with the Xarelto.  We will check her blood counts every 3 weeks now.  I will see her back in 6 weeks.  [name], MD 12/14/20169:26 AM

## 2015-10-18 ENCOUNTER — Ambulatory Visit (INDEPENDENT_AMBULATORY_CARE_PROVIDER_SITE_OTHER): Payer: 59 | Admitting: Internal Medicine

## 2015-10-18 ENCOUNTER — Other Ambulatory Visit (INDEPENDENT_AMBULATORY_CARE_PROVIDER_SITE_OTHER): Payer: 59

## 2015-10-18 ENCOUNTER — Encounter: Payer: Self-pay | Admitting: Internal Medicine

## 2015-10-18 VITALS — BP 140/84 | HR 76 | Temp 98.4°F | Ht 64.0 in | Wt 188.0 lb

## 2015-10-18 DIAGNOSIS — R739 Hyperglycemia, unspecified: Secondary | ICD-10-CM | POA: Diagnosis not present

## 2015-10-18 DIAGNOSIS — D693 Immune thrombocytopenic purpura: Secondary | ICD-10-CM

## 2015-10-18 DIAGNOSIS — I10 Essential (primary) hypertension: Secondary | ICD-10-CM

## 2015-10-18 DIAGNOSIS — Z0000 Encounter for general adult medical examination without abnormal findings: Secondary | ICD-10-CM

## 2015-10-18 DIAGNOSIS — F411 Generalized anxiety disorder: Secondary | ICD-10-CM | POA: Diagnosis not present

## 2015-10-18 DIAGNOSIS — I2692 Saddle embolus of pulmonary artery without acute cor pulmonale: Secondary | ICD-10-CM

## 2015-10-18 LAB — HEPATIC FUNCTION PANEL
ALBUMIN: 4.2 g/dL (ref 3.5–5.2)
ALK PHOS: 91 U/L (ref 39–117)
ALT: 19 U/L (ref 0–35)
AST: 16 U/L (ref 0–37)
BILIRUBIN DIRECT: 0.1 mg/dL (ref 0.0–0.3)
TOTAL PROTEIN: 7.5 g/dL (ref 6.0–8.3)
Total Bilirubin: 0.4 mg/dL (ref 0.2–1.2)

## 2015-10-18 LAB — URINALYSIS, ROUTINE W REFLEX MICROSCOPIC
BILIRUBIN URINE: NEGATIVE
HGB URINE DIPSTICK: NEGATIVE
KETONES UR: NEGATIVE
Nitrite: NEGATIVE
RBC / HPF: NONE SEEN (ref 0–?)
Specific Gravity, Urine: 1.01 (ref 1.000–1.030)
Total Protein, Urine: NEGATIVE
UROBILINOGEN UA: 0.2 (ref 0.0–1.0)
Urine Glucose: NEGATIVE
pH: 6 (ref 5.0–8.0)

## 2015-10-18 LAB — CBC WITH DIFFERENTIAL/PLATELET
BASOS ABS: 0.1 10*3/uL (ref 0.0–0.1)
BASOS PCT: 0.6 % (ref 0.0–3.0)
EOS ABS: 0.5 10*3/uL (ref 0.0–0.7)
Eosinophils Relative: 5.1 % — ABNORMAL HIGH (ref 0.0–5.0)
HEMATOCRIT: 40.8 % (ref 36.0–46.0)
Hemoglobin: 13.4 g/dL (ref 12.0–15.0)
LYMPHS ABS: 2.5 10*3/uL (ref 0.7–4.0)
Lymphocytes Relative: 27.2 % (ref 12.0–46.0)
MCHC: 32.9 g/dL (ref 30.0–36.0)
MCV: 93.8 fl (ref 78.0–100.0)
Monocytes Absolute: 0.9 10*3/uL (ref 0.1–1.0)
Monocytes Relative: 9.6 % (ref 3.0–12.0)
NEUTROS ABS: 5.2 10*3/uL (ref 1.4–7.7)
NEUTROS PCT: 57.5 % (ref 43.0–77.0)
PLATELETS: 173 10*3/uL (ref 150.0–400.0)
RBC: 4.34 Mil/uL (ref 3.87–5.11)
RDW: 14.2 % (ref 11.5–15.5)
WBC: 9 10*3/uL (ref 4.0–10.5)

## 2015-10-18 LAB — LIPID PANEL
Cholesterol: 179 mg/dL (ref 0–200)
HDL: 46.5 mg/dL (ref >39.00)
LDL Cholesterol: 107 mg/dL — ABNORMAL HIGH (ref 0–99)
NONHDL: 132.8
TRIGLYCERIDES: 128 mg/dL (ref 0.0–149.0)
Total CHOL/HDL Ratio: 4
VLDL: 25.6 mg/dL (ref 0.0–40.0)

## 2015-10-18 LAB — BASIC METABOLIC PANEL
BUN: 15 mg/dL (ref 6–23)
CALCIUM: 9.6 mg/dL (ref 8.4–10.5)
CHLORIDE: 99 meq/L (ref 96–112)
CO2: 32 meq/L (ref 19–32)
CREATININE: 0.95 mg/dL (ref 0.40–1.20)
GFR: 65.44 mL/min (ref >60.00)
GLUCOSE: 99 mg/dL (ref 70–99)
Potassium: 4.2 mEq/L (ref 3.5–5.1)
Sodium: 137 mEq/L (ref 135–145)

## 2015-10-18 LAB — TSH: TSH: 0.82 u[IU]/mL (ref 0.35–4.50)

## 2015-10-18 LAB — HEMOGLOBIN A1C: HEMOGLOBIN A1C: 5.9 % (ref 4.6–6.5)

## 2015-10-18 MED ORDER — PAROXETINE HCL 10 MG PO TABS: 10.0000 mg | ORAL_TABLET | Freq: Every day | ORAL | Status: DC

## 2015-10-18 MED ORDER — NEBIVOLOL HCL 5 MG PO TABS: 5.0000 mg | ORAL_TABLET | Freq: Every day | ORAL | Status: DC

## 2015-10-18 MED ORDER — ALPRAZOLAM 0.25 MG PO TABS: 0.2500 mg | ORAL_TABLET | Freq: Two times a day (BID) | ORAL | Status: DC | PRN

## 2015-10-18 MED ORDER — VALSARTAN-HYDROCHLOROTHIAZIDE 320-25 MG PO TABS: 1.0000 | ORAL_TABLET | Freq: Every day | ORAL | Status: DC

## 2015-10-18 NOTE — Assessment & Plan Note (Signed)
BP Readings from Last 3 Encounters:  10/18/15 140/84  10/03/15 127/68  08/20/15 126/76   Reports compliance with bystolic and ARB/hct as rx'd the patient to continue to monitor and call if uncontrolled before next visit

## 2015-10-18 NOTE — Assessment & Plan Note (Signed)
Associated with menorrhagia and clotting fall 2011 Status post 6 weeks anticoagulation 09/2010, then ASA 81 mg daily, then none 2014 (start of Rituxan) - 03/2015 Massive PE 03/2015 - resumed chronic Xarelto because of same Follows regularly with hematology and will establish with anticoag clinic at [facility] as well HX: s/p rituxan x 2 in 2014 and prev decadron+ eltombopag weekly for same

## 2015-10-18 NOTE — Progress Notes (Signed)
Subjective:    Patient ID: Janice Martin, female    DOB: December 21, 1960, 54 y.o.   MRN: [id number]  HPI  patient is here today for annual physical. Patient feels well and has no complaints. Also reviewed chronic medical conditions, interval events and current concerns  Past Medical History  Diagnosis Date  . ANEMIA-IRON DEFICIENCY   . Immune thrombocytopenic purpura ([place]) 2003 dx    s/p rituxan 06/2012 - now remission  . LEUKOCYTOSIS UNSPECIFIED   . Anxiety state, unspecified   . HYPERTENSION   . DVT of leg (deep venous thrombosis) ([place]) 04/04/2015  . Pulmonary embolism [facility]) June 2016    chronic anticoag due to high risk recurrence   Family History  Problem Relation Age of Onset  . Arthritis Mother   . Arthritis Father   . Mental illness Father     severe depression  . Hypertension Other     Parent  . Heart disease Other     Grandparent  . Stroke Other     grandparent  . Multiple sclerosis Sister   . Diabetes Mother    Social History  Substance Use Topics  . Smoking status: Never Smoker   . Smokeless tobacco: Never Used     Comment: never used product  . Alcohol Use: No    Review of Systems  Constitutional: Negative for fatigue and unexpected weight change.  Respiratory: Negative for cough, shortness of breath and wheezing.   Cardiovascular: Negative for chest pain, palpitations and leg swelling.  Gastrointestinal: Negative for nausea, abdominal pain and diarrhea.  Neurological: Negative for dizziness, weakness, light-headedness and headaches.  Psychiatric/Behavioral: Negative for suicidal ideas, hallucinations, sleep disturbance, self-injury, dysphoric mood and decreased concentration. The patient is nervous/anxious (situational, increasing sx).   All other systems reviewed and are negative.      Objective:    Physical Exam  Constitutional: She is oriented to person, place, and time. She appears well-developed and well-nourished. No distress.  obese  HENT:    Head: Normocephalic and atraumatic.  Right Ear: External ear normal.  Left Ear: External ear normal.  Nose: Nose normal.  Mouth/Throat: Oropharynx is clear and moist. No oropharyngeal exudate.  Eyes: EOM are normal. Pupils are equal, round, and reactive to light. Right eye exhibits no discharge. Left eye exhibits no discharge. No scleral icterus.  Neck: Normal range of motion. Neck supple. No JVD present. No tracheal deviation present. No thyromegaly present.  Cardiovascular: Normal rate, regular rhythm, normal heart sounds and intact distal pulses.  Exam reveals no friction rub.   No murmur heard. Pulmonary/Chest: Effort normal and breath sounds normal. No respiratory distress. She has no wheezes. She has no rales. She exhibits no tenderness.  Abdominal: Soft. Bowel sounds are normal. She exhibits no distension and no mass. There is no tenderness. There is no rebound and no guarding.  Musculoskeletal: Normal range of motion.  Lymphadenopathy:    She has no cervical adenopathy.  Neurological: She is alert and oriented to person, place, and time. She has normal reflexes. No cranial nerve deficit.  Skin: Skin is warm and dry. No rash noted. She is not diaphoretic. No erythema.  Psychiatric: She has a normal mood and affect. Her behavior is normal. Judgment and thought content normal.  Nursing note and vitals reviewed.   BP 140/84 mmHg  Pulse 76  Temp(Src) 98.4 F (36.9 C) (Oral)  Ht 5\' 4"  (1.626 m)  Wt 188 lb (85.276 kg)  BMI 32.25 kg/m2  SpO2 98% Wt  Readings from Last 3 Encounters:  10/18/15 188 lb (85.276 kg)  10/03/15 182 lb (82.555 kg)  08/20/15 190 lb (86.183 kg)    Lab Results  Component Value Date   WBC 8.4 10/03/2015   HGB 13.5 10/03/2015   HCT 39.5 10/03/2015   PLT 164 10/03/2015   GLUCOSE 103 08/20/2015   CHOL 190 02/18/2013   TRIG 89.0 02/18/2013   HDL 44.20 02/18/2013   LDLCALC 128* 02/18/2013   ALT 24 08/20/2015   AST 22 08/20/2015   NA 141 08/20/2015   K  3.7 08/20/2015   CL 97* 08/20/2015   CREATININE 0.8 08/20/2015   BUN 10 08/20/2015   CO2 27 08/20/2015   TSH 0.55 02/18/2013   INR 0.9* 09/30/2010   HGBA1C 6.1* 03/25/2015    Ct Angio Chest Pe W/cm &/or Wo Cm  07/09/2015  CLINICAL DATA:  Subsequent evaluation for pulmonary embolism diagnosed June 2016 EXAM: CT ANGIOGRAPHY CHEST WITH CONTRAST TECHNIQUE: Multidetector CT imaging of the chest was performed using the standard protocol during bolus administration of intravenous contrast. Multiplanar CT image reconstructions and MIPs were obtained to evaluate the vascular anatomy. CONTRAST:  167mL OMNIPAQUE IOHEXOL 350 MG/ML SOLN COMPARISON:  03/24/2015 FINDINGS: Mediastinum/Nodes: Thyroid is normal. Stable 9 mm sub carinal lymph node. Smaller adjacent nodes also stable. No pericardial effusion. Thoracic aorta shows no dissection or dilatation. No remaining filling defects in the pulmonary arterial system identified. Lungs/Pleura: Lungs are clear. There is no persistent left upper lobe infiltrate. There is no pleural effusion. Upper abdomen: No acute findings Musculoskeletal: No acute findings Review of the MIP images confirms the above findings. IMPRESSION: Previously demonstrated bilateral pulmonary emboli have cleared. Electronically Signed   By: Skipper Cliche M.D.   On: 07/09/2015 09:40       Assessment & Plan:   CPX/z00.00 - Patient has been counseled on age-appropriate routine health concerns for screening and prevention. These are reviewed and up-to-date. Immunizations are up-to-date or declined. Labs ordered and reviewed.  Problem List Items Addressed This Visit    Anxiety state    Multiple situational stressors including severe medical illness personally Also personal caregiver for elderly mother (declining health) and sister (wheelchair-bound end-stage MS, at [facility]) Remotely used low-dose when necessary Xanax, but would like to have daily medication at this time but is nonaddictive Will  initiate low-dose generic Paxil for daily use and resume when necessary alprazolam 0.25 mg we reviewed potential risk/benefit and possible side effects - pt understands and agrees to same  erx done - arrange follow-up with new primary care physician in 3 months to review symptoms and medication titration as needed, patient agrees call sooner if problems Verified no SI/HI      Relevant Medications   ALPRAZolam (XANAX) 0.25 MG tablet   PARoxetine (PAXIL) 10 MG tablet   Essential hypertension    BP Readings from Last 3 Encounters:  10/18/15 140/84  10/03/15 127/68  08/20/15 126/76   Reports compliance with bystolic and ARB/hct as rx'd the patient to continue to monitor and call if uncontrolled before next visit      Relevant Medications   Rivaroxaban (XARELTO) 15 MG TABS tablet   valsartan-hydrochlorothiazide (DIOVAN-HCT) 320-25 MG tablet   nebivolol (BYSTOLIC) 5 MG tablet   Hyperglycemia    Reviewed FH DM (mom) and tendency towards insulin resistance Check a1c The patient is asked to make an attempt to improve diet and exercise patterns to aid in medical management of this problem. Lab Results  Component Value Date  HGBA1C 6.1* 03/25/2015         Relevant Orders   Hemoglobin A1c   IMMUNE THROMBOCYTOPENIC PURPURA    Associated with menorrhagia and clotting fall 2011 Status post 6 weeks anticoagulation 09/2010, then ASA 81 mg daily, then none 2014 (start of Rituxan) - 03/2015 Massive PE 03/2015 - resumed chronic Xarelto because of same Follows regularly with hematology and will establish with anticoag clinic at [facility] as well HX: s/p rituxan x 2 in 2014 and prev decadron+ eltombopag weekly for same      Saddle embolus of pulmonary artery ([place])    Event 03/2015 with ICU for thrombolysis in setting of severe ITP flare reviewed Now on chronic Xarelto for high risk recurrent event Reviewed pulm notes Will set up to follow with [facility] for anticoagulation clinic given ongoing need for  surveillance       Relevant Medications   Rivaroxaban (XARELTO) 15 MG TABS tablet   valsartan-hydrochlorothiazide (DIOVAN-HCT) 320-25 MG tablet   nebivolol (BYSTOLIC) 5 MG tablet    Other Visit Diagnoses    Routine general medical examination at a health care facility    -  Primary    Relevant Orders    Basic metabolic panel    CBC with Differential/Platelet    Hepatic function panel    Lipid panel    TSH    Urinalysis, Routine w reflex microscopic (not at [facility])        Gwendolyn Grant, MD

## 2015-10-18 NOTE — Assessment & Plan Note (Signed)
Reviewed FH DM (mom) and tendency towards insulin resistance Check a1c The patient is asked to make an attempt to improve diet and exercise patterns to aid in medical management of this problem. Lab Results  Component Value Date   HGBA1C 6.1* 03/25/2015

## 2015-10-18 NOTE — Assessment & Plan Note (Signed)
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

## 2015-10-18 NOTE — Patient Instructions (Signed)
It was good to see you today.  We have reviewed your prior records including labs and tests today  Health Maintenance reviewed - will hold on mammogram and colonoscopy at this time until medical conditions have further stabilized - all other recommended immunizations and age-appropriate screenings are up-to-date.  Test(s) ordered today. Your results will be released to [place] (or called to you) after review, usually within 72hours after test completion. If any changes need to be made, you will be notified at that same time.  Medications reviewed and updated Okay to start low-dose Paxil every day for anxiety symptoms. Also low-dose Xanax if needed for panic attack or uncontrolled symptoms. No other prescription changes recommended at this time.  Your prescription(s) have been submitted to your pharmacy. Please take as directed and contact our office if you believe you are having problem(s) with the medication(s).  We will help arrange follow-up at anticoagulation clinic with [facility] as discussed to follow your Xarelto  We will arrange follow-up with new primary care physician Dr. Quay Burow in 3 months to recheck anxiety symptoms and medications and titrate as needed

## 2015-10-18 NOTE — Assessment & Plan Note (Signed)
Event 03/2015 with ICU for thrombolysis in setting of severe ITP flare reviewed Now on chronic Xarelto for high risk recurrent event Reviewed pulm notes Will set up to follow with [facility] for anticoagulation clinic given ongoing need for surveillance

## 2015-10-18 NOTE — Progress Notes (Signed)
Pre visit review using our clinic review tool, if applicable. No additional management support is needed unless otherwise documented below in the visit note. 

## 2015-11-14 ENCOUNTER — Other Ambulatory Visit (HOSPITAL_BASED_OUTPATIENT_CLINIC_OR_DEPARTMENT_OTHER): Payer: 59

## 2015-11-14 ENCOUNTER — Encounter: Payer: Self-pay | Admitting: Hematology & Oncology

## 2015-11-14 ENCOUNTER — Ambulatory Visit (HOSPITAL_BASED_OUTPATIENT_CLINIC_OR_DEPARTMENT_OTHER): Payer: 59 | Admitting: Hematology & Oncology

## 2015-11-14 VITALS — BP 135/59 | HR 70 | Temp 97.6°F | Resp 16 | Ht 64.0 in | Wt 182.0 lb

## 2015-11-14 DIAGNOSIS — I82409 Acute embolism and thrombosis of unspecified deep veins of unspecified lower extremity: Secondary | ICD-10-CM

## 2015-11-14 DIAGNOSIS — D693 Immune thrombocytopenic purpura: Secondary | ICD-10-CM

## 2015-11-14 DIAGNOSIS — I2782 Chronic pulmonary embolism: Secondary | ICD-10-CM

## 2015-11-14 DIAGNOSIS — I2692 Saddle embolus of pulmonary artery without acute cor pulmonale: Secondary | ICD-10-CM | POA: Diagnosis not present

## 2015-11-14 DIAGNOSIS — D509 Iron deficiency anemia, unspecified: Secondary | ICD-10-CM

## 2015-11-14 LAB — CHCC SATELLITE - SMEAR

## 2015-11-14 LAB — CBC WITH DIFFERENTIAL (CANCER CENTER ONLY)
BASO#: 0 10*3/uL (ref 0.0–0.2)
BASO%: 0.3 % (ref 0.0–2.0)
EOS%: 4.3 % (ref 0.0–7.0)
Eosinophils Absolute: 0.5 10*3/uL (ref 0.0–0.5)
HCT: 39.1 % (ref 34.8–46.6)
HGB: 13.6 g/dL (ref 11.6–15.9)
LYMPH#: 2.5 10*3/uL (ref 0.9–3.3)
LYMPH%: 21.7 % (ref 14.0–48.0)
MCH: 31.7 pg (ref 26.0–34.0)
MCHC: 34.8 g/dL (ref 32.0–36.0)
MCV: 91 fL (ref 81–101)
MONO#: 1 10*3/uL — ABNORMAL HIGH (ref 0.1–0.9)
MONO%: 8.2 % (ref 0.0–13.0)
NEUT#: 7.5 10*3/uL — ABNORMAL HIGH (ref 1.5–6.5)
NEUT%: 65.5 % (ref 39.6–80.0)
PLATELETS: 119 10*3/uL — AB (ref 145–400)
RBC: 4.29 10*6/uL (ref 3.70–5.32)
RDW: 13.4 % (ref 11.1–15.7)
WBC: 11.5 10*3/uL — ABNORMAL HIGH (ref 3.9–10.0)

## 2015-11-14 LAB — COMPREHENSIVE METABOLIC PANEL
ALBUMIN: 3.9 g/dL (ref 3.5–5.0)
ALK PHOS: 84 U/L (ref 40–150)
ALT: 18 U/L (ref 0–55)
AST: 17 U/L (ref 5–34)
Anion Gap: 8 mEq/L (ref 3–11)
BILIRUBIN TOTAL: 0.42 mg/dL (ref 0.20–1.20)
BUN: 12.8 mg/dL (ref 7.0–26.0)
CO2: 29 meq/L (ref 22–29)
CREATININE: 1.1 mg/dL (ref 0.6–1.1)
Calcium: 9.2 mg/dL (ref 8.4–10.4)
Chloride: 103 mEq/L (ref 98–109)
EGFR: 59 mL/min/{1.73_m2} — ABNORMAL LOW (ref >90)
GLUCOSE: 103 mg/dL (ref 70–140)
Potassium: 4.3 mEq/L (ref 3.5–5.1)
SODIUM: 140 meq/L (ref 136–145)
TOTAL PROTEIN: 7.4 g/dL (ref 6.4–8.3)

## 2015-11-14 MED ORDER — RIVAROXABAN 15 MG PO TABS: 15.0000 mg | ORAL_TABLET | Freq: Every day | ORAL | Status: DC

## 2015-11-14 MED FILL — XARELTO 15 MG TABLET: 15 | 90 days supply | Qty: 90 | Fill #0

## 2015-11-14 NOTE — Progress Notes (Signed)
Hematology and Oncology Follow Up Visit  Janice Martin [id number] May 02, 1961 55 y.o. 11/14/2015   Principle Diagnosis:   Chronic immune thrombocytopenia-recurrent  Submassive pulmonary embolism, with right lower extremity thrombus  Current Therapy:    Xarelto 15 mg by mouth daily     Interim History:  [name] is back for followup. She continues to do well. 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 She is busy.  She had no pounds over the holidays. She had no problems about bleeding. There is no cough or shortness of breath. She had no chest wall pain. She had no issues with leg swelling. She had no change in bowel or bladder habits. There was no nausea or vomiting.  She is planning to go to nearest city in May for her anniversary. She's never been there before. She really is looking for to this.  Overall, performance status is ECOG 1.   Medications:  Current outpatient prescriptions:  .  ALPRAZolam (XANAX) 0.25 MG tablet, Take 1 tablet (0.25 mg total) by mouth 2 (two) times daily as needed for anxiety., Disp: 30 tablet, Rfl: 1 .  nebivolol (BYSTOLIC) 5 MG tablet, Take 1 tablet (5 mg total) by mouth daily., Disp: 90 tablet, Rfl: 3 .  PARoxetine (PAXIL) 10 MG tablet, Take 1 tablet (10 mg total) by mouth daily., Disp: 90 tablet, Rfl: 1 .  Rivaroxaban (XARELTO) 15 MG TABS tablet, Take 1 tablet (15 mg total) by mouth daily with supper., Disp: 90 tablet, Rfl: 3 .  valsartan-hydrochlorothiazide (DIOVAN-HCT) 320-25 MG tablet, Take 1 tablet by mouth daily., Disp: 90 tablet, Rfl: 3 .  vitamin B-12 (CYANOCOBALAMIN) 100 MCG tablet, Take 100 mcg by mouth daily., Disp: , Rfl:   Allergies:  Allergies  Allergen Reactions  . Shellfish Allergy Anaphylaxis  . Influenza Vaccines     Per pt can't take mess up with her immune system  . Morphine Other (See Comments)    Burns really bad    Past Medical History, Surgical history, Social history, and Family History were  reviewed and updated.  Review of Systems: As above  Physical Exam:  height is 5\' 4"  (1.626 m) and weight is 182 lb (82.555 kg). Her oral temperature is 97.6 F (36.4 C). Her blood pressure is 135/59 and her pulse is 70. Her respiration is 16.   Well-developed and well-nourished white female. Head and neck exam shows no ocular or oral lesions. There is no adenopathy of the neck. Lungs are clear bilaterally. She has no rales, wheezes or rhonchi. Cardiac exam is regular rate and rhythm with no murmurs rubs or bruits. Abdomen has a well-healed splenectomy scar. She has no fluid wave. There is no palpable liver edge. Back exam no tenderness over the spine ribs or hips. Extremities no clubbing cyanosis or edema. I cannot palpate any venous cord in the right leg. She has a negative Homans sign in the right leg. She has good pulses in her distal extremities. Skin exam shows no rashes ecchymoses or petechia. Neurological exam is nonfocal.  Lab Results  Component Value Date   WBC 11.5* 11/14/2015   HGB 13.6 11/14/2015   HCT 39.1 11/14/2015   MCV 91 11/14/2015   PLT 119* 11/14/2015     Chemistry      Component Value Date/Time   NA 137 10/18/2015 1114   NA 141 08/20/2015 1118   NA 141 07/23/2015 1217   K 4.2 10/18/2015 1114   K 3.7 08/20/2015 1118  K 3.4* 07/23/2015 1217   CL 99 10/18/2015 1114   CL 97* 08/20/2015 1118   CO2 32 10/18/2015 1114   CO2 27 08/20/2015 1118   CO2 30* 07/23/2015 1217   BUN 15 10/18/2015 1114   BUN 10 08/20/2015 1118   BUN 9.8 07/23/2015 1217   CREATININE 0.95 10/18/2015 1114   CREATININE 0.8 08/20/2015 1118   CREATININE 1.0 07/23/2015 1217      Component Value Date/Time   CALCIUM 9.6 10/18/2015 1114   CALCIUM 8.9 08/20/2015 1118   CALCIUM 9.5 07/23/2015 1217   ALKPHOS 91 10/18/2015 1114   ALKPHOS 77 08/20/2015 1118   ALKPHOS 101 07/23/2015 1217   AST 16 10/18/2015 1114   AST 22 08/20/2015 1118   AST 15 07/23/2015 1217   ALT 19 10/18/2015 1114   ALT  24 08/20/2015 1118   ALT 25 07/23/2015 1217   BILITOT 0.4 10/18/2015 1114   BILITOT 0.60 08/20/2015 1118   BILITOT <0.30 07/23/2015 1217       Impression and Plan: Janice Martin is a 55 year old white female. She has a history of relapsed chronic immune thrombocytopenia. We treated her with Rituxan. She responded very well. She got treated back in October of 2014.  Her platelet count is a little bit lower. As such, we have to be diligent about this. It has been 2-1/2 years since she had Rituxan which she responded very well to.  She will need lifelong anticoagulation.   As far as the pulmonary embolism is concerned, I really don't think that is going be much of an issue at this point. She is doing well with the Xarelto.  We will check her blood counts every 3 weeks now.  I will see her back in 6 weeks.  Janice Napoleon, MD 1/25/20178:52 AM

## 2015-11-15 ENCOUNTER — Telehealth: Payer: Self-pay

## 2015-11-15 NOTE — Telephone Encounter (Signed)
Request to cancel appt scheduled for coumadin 11/16/2015.   Pt is on xarelto and no need for pt to come.  LVM for pt to call back as soon as possible.  RE: cancelling appt.   Jenny Reichmann was able to get in touch with pt on work number.

## 2015-11-16 ENCOUNTER — Ambulatory Visit: Payer: 59

## 2015-11-30 ENCOUNTER — Other Ambulatory Visit: Payer: Self-pay | Admitting: *Deleted

## 2015-11-30 ENCOUNTER — Ambulatory Visit (HOSPITAL_BASED_OUTPATIENT_CLINIC_OR_DEPARTMENT_OTHER): Payer: 59

## 2015-11-30 ENCOUNTER — Telehealth: Payer: Self-pay | Admitting: *Deleted

## 2015-11-30 DIAGNOSIS — D693 Immune thrombocytopenic purpura: Secondary | ICD-10-CM

## 2015-11-30 LAB — CBC WITH DIFFERENTIAL/PLATELET
BASO%: 0.4 % (ref 0.0–2.0)
BASOS ABS: 0 10*3/uL (ref 0.0–0.1)
EOS ABS: 0 10*3/uL (ref 0.0–0.5)
EOS%: 0 % (ref 0.0–7.0)
HEMATOCRIT: 40.6 % (ref 34.8–46.6)
HEMOGLOBIN: 13.5 g/dL (ref 11.6–15.9)
LYMPH%: 10.8 % — AB (ref 14.0–49.7)
MCH: 30.7 pg (ref 25.1–34.0)
MCHC: 33.3 g/dL (ref 31.5–36.0)
MCV: 92.2 fL (ref 79.5–101.0)
MONO#: 0 10*3/uL — ABNORMAL LOW (ref 0.1–0.9)
MONO%: 0.8 % (ref 0.0–14.0)
NEUT%: 88 % — ABNORMAL HIGH (ref 38.4–76.8)
NEUTROS ABS: 5.6 10*3/uL (ref 1.5–6.5)
Platelets: 82 10*3/uL — ABNORMAL LOW (ref 145–400)
RBC: 4.4 10*6/uL (ref 3.70–5.45)
RDW: 13.8 % (ref 11.2–14.5)
WBC: 6.3 10*3/uL (ref 3.9–10.3)
lymph#: 0.7 10*3/uL — ABNORMAL LOW (ref 0.9–3.3)

## 2015-11-30 LAB — COMPREHENSIVE METABOLIC PANEL
ALT: 22 U/L (ref 0–55)
AST: 17 U/L (ref 5–34)
Albumin: 4 g/dL (ref 3.5–5.0)
Alkaline Phosphatase: 96 U/L (ref 40–150)
Anion Gap: 13 mEq/L — ABNORMAL HIGH (ref 3–11)
BILIRUBIN TOTAL: 0.35 mg/dL (ref 0.20–1.20)
BUN: 17.2 mg/dL (ref 7.0–26.0)
CALCIUM: 9.5 mg/dL (ref 8.4–10.4)
CHLORIDE: 100 meq/L (ref 98–109)
CO2: 23 meq/L (ref 22–29)
CREATININE: 1 mg/dL (ref 0.6–1.1)
EGFR: 63 mL/min/{1.73_m2} — ABNORMAL LOW (ref >90)
Glucose: 149 mg/dl — ABNORMAL HIGH (ref 70–140)
Potassium: 4.1 mEq/L (ref 3.5–5.1)
Sodium: 137 mEq/L (ref 136–145)
TOTAL PROTEIN: 7.9 g/dL (ref 6.4–8.3)

## 2015-11-30 NOTE — Telephone Encounter (Signed)
Patient noticed small bruises to her legs last pm. She doesn't have any other signs of bleeding. She isn't scheduled for lab until 12/07/2015. She began taking her prn decadron as instructed.   Spoke to Dr Marin Olp and he would like patient to have labs drawn today. Appointment made at [facility]. Patient aware. Will await results for any further potential orders.

## 2015-12-07 ENCOUNTER — Other Ambulatory Visit (HOSPITAL_BASED_OUTPATIENT_CLINIC_OR_DEPARTMENT_OTHER): Payer: 59

## 2015-12-07 DIAGNOSIS — I2782 Chronic pulmonary embolism: Secondary | ICD-10-CM

## 2015-12-07 DIAGNOSIS — I2692 Saddle embolus of pulmonary artery without acute cor pulmonale: Secondary | ICD-10-CM

## 2015-12-07 DIAGNOSIS — D693 Immune thrombocytopenic purpura: Secondary | ICD-10-CM

## 2015-12-07 LAB — CBC WITH DIFFERENTIAL/PLATELET
BASO%: 0.1 % (ref 0.0–2.0)
Basophils Absolute: 0 10*3/uL (ref 0.0–0.1)
EOS%: 1.9 % (ref 0.0–7.0)
Eosinophils Absolute: 0.3 10*3/uL (ref 0.0–0.5)
HCT: 37.8 % (ref 34.8–46.6)
HGB: 13 g/dL (ref 11.6–15.9)
LYMPH%: 38.9 % (ref 14.0–49.7)
MCH: 31.4 pg (ref 25.1–34.0)
MCHC: 34.4 g/dL (ref 31.5–36.0)
MCV: 91.3 fL (ref 79.5–101.0)
MONO#: 1.4 10*3/uL — AB (ref 0.1–0.9)
MONO%: 7.8 % (ref 0.0–14.0)
NEUT%: 51.3 % (ref 38.4–76.8)
NEUTROS ABS: 9 10*3/uL — AB (ref 1.5–6.5)
RBC: 4.14 10*6/uL (ref 3.70–5.45)
RDW: 13.8 % (ref 11.2–14.5)
WBC: 17.5 10*3/uL — AB (ref 3.9–10.3)
lymph#: 6.8 10*3/uL — ABNORMAL HIGH (ref 0.9–3.3)

## 2015-12-07 LAB — COMPREHENSIVE METABOLIC PANEL
ALT: 22 U/L (ref 0–55)
ANION GAP: 7 meq/L (ref 3–11)
AST: 12 U/L (ref 5–34)
Albumin: 3.4 g/dL — ABNORMAL LOW (ref 3.5–5.0)
Alkaline Phosphatase: 77 U/L (ref 40–150)
BUN: 26.7 mg/dL — ABNORMAL HIGH (ref 7.0–26.0)
CHLORIDE: 103 meq/L (ref 98–109)
CO2: 30 meq/L — AB (ref 22–29)
CREATININE: 1.1 mg/dL (ref 0.6–1.1)
Calcium: 8.8 mg/dL (ref 8.4–10.4)
EGFR: 55 mL/min/{1.73_m2} — AB (ref >90)
GLUCOSE: 82 mg/dL (ref 70–140)
Potassium: 3.4 mEq/L — ABNORMAL LOW (ref 3.5–5.1)
SODIUM: 140 meq/L (ref 136–145)
Total Bilirubin: 0.51 mg/dL (ref 0.20–1.20)
Total Protein: 6.2 g/dL — ABNORMAL LOW (ref 6.4–8.3)

## 2015-12-07 LAB — TECHNOLOGIST REVIEW

## 2015-12-10 ENCOUNTER — Ambulatory Visit (INDEPENDENT_AMBULATORY_CARE_PROVIDER_SITE_OTHER): Payer: 59 | Admitting: Internal Medicine

## 2015-12-10 ENCOUNTER — Other Ambulatory Visit: Payer: 59

## 2015-12-10 ENCOUNTER — Encounter: Payer: Self-pay | Admitting: Internal Medicine

## 2015-12-10 VITALS — BP 122/78 | HR 64 | Ht 64.0 in | Wt 183.2 lb

## 2015-12-10 DIAGNOSIS — I2699 Other pulmonary embolism without acute cor pulmonale: Secondary | ICD-10-CM

## 2015-12-10 NOTE — Progress Notes (Signed)
Subjective:     Patient ID: Janice Martin, female   DOB: Nov 13, 1960, 55 y.o.   MRN: [id number]  HPI   OV 12/10/2015  Chief Complaint  Patient presents with  . Follow-up    Pt states that she is doing well. Pt denies cough/wheeze/CP/tightness.    Follow-up submassive pulmonary embolism June 2016.  Currently doing well. Seen by Dr. Johnette Abraham in January 2017 and his overall dose reduced to 15 mg per day. His notes indicate that she will need lifelong anticoagulation especially given her ITP. She is on board with this program. She says the dose was reduced 15 mg per day. Review of the notes indicate the dose was reduced. I presume this might be because of bleeding risk. Most recently she had a ITP flare and was given Decadron and then a week ago her labs were checked and her platelet count is 320,005 partially reviewed is resolved. Currently she is doing well. No respirator complaints no bleeding. She is lost significant amount of weight due to dietary restriction and exercise all intentionally through the [facility].    has a past medical history of ANEMIA-IRON DEFICIENCY; Immune thrombocytopenic purpura ([place]) (2003 dx); LEUKOCYTOSIS UNSPECIFIED; Anxiety state, unspecified; HYPERTENSION; DVT of leg (deep venous thrombosis) ([place]) (04/04/2015); and Pulmonary embolism [facility]) (June 2016).   reports that she has never smoked. She has never used smokeless tobacco.  Past Surgical History  Procedure Laterality Date  . Cholecystectomy    . Spleenectomy    . Cesarean section      x's 2    Allergies  Allergen Reactions  . Shellfish Allergy Anaphylaxis  . Influenza Vaccines     Per pt can't take mess up with her immune system  . Morphine Other (See Comments)    Burns really bad    Immunization History  Administered Date(s) Administered  . Influenza Whole 07/20/2009  . Td 10/20/2006    Family History  Problem Relation Age of Onset  . Arthritis Mother   . Arthritis Father   .  Mental illness Father     severe depression  . Hypertension Other     Parent  . Heart disease Other     Grandparent  . Stroke Other     grandparent  . Multiple sclerosis Sister   . Diabetes Mother      Current outpatient prescriptions:  .  ALPRAZolam (XANAX) 0.25 MG tablet, Take 1 tablet (0.25 mg total) by mouth 2 (two) times daily as needed for anxiety., Disp: 30 tablet, Rfl: 1 .  nebivolol (BYSTOLIC) 5 MG tablet, Take 1 tablet (5 mg total) by mouth daily., Disp: 90 tablet, Rfl: 3 .  PARoxetine (PAXIL) 10 MG tablet, Take 1 tablet (10 mg total) by mouth daily., Disp: 90 tablet, Rfl: 1 .  Rivaroxaban (XARELTO) 15 MG TABS tablet, Take 1 tablet (15 mg total) by mouth daily with supper., Disp: 90 tablet, Rfl: 3 .  valsartan-hydrochlorothiazide (DIOVAN-HCT) 320-25 MG tablet, Take 1 tablet by mouth daily., Disp: 90 tablet, Rfl: 3 .  vitamin B-12 (CYANOCOBALAMIN) 100 MCG tablet, Take 100 mcg by mouth daily., Disp: , Rfl:      Review of Systems According to history of present illness.    Objective:   Physical Exam  Constitutional: She is oriented to person, place, and time. She appears well-developed and well-nourished. No distress.  Due to her weight loss and did not recognize her.  HENT:  Head: Normocephalic and atraumatic.  Right Ear: External ear  normal.  Left Ear: External ear normal.  Mouth/Throat: Oropharynx is clear and moist. No oropharyngeal exudate.  Eyes: Conjunctivae and EOM are normal. Pupils are equal, round, and reactive to light. Right eye exhibits no discharge. Left eye exhibits no discharge. No scleral icterus.  Neck: Normal range of motion. Neck supple. No JVD present. No tracheal deviation present. No thyromegaly present.  Cardiovascular: Normal rate, regular rhythm, normal heart sounds and intact distal pulses.  Exam reveals no gallop and no friction rub.   No murmur heard. Pulmonary/Chest: Effort normal and breath sounds normal. No respiratory distress. She has  no wheezes. She has no rales. She exhibits no tenderness.  Abdominal: Soft. Bowel sounds are normal. She exhibits no distension and no mass. There is no tenderness. There is no rebound and no guarding.  Musculoskeletal: Normal range of motion. She exhibits no edema or tenderness.  Lymphadenopathy:    She has no cervical adenopathy.  Neurological: She is alert and oriented to person, place, and time. She has normal reflexes. No cranial nerve deficit. She exhibits normal muscle tone. Coordination normal.  Skin: Skin is warm and dry. No rash noted. She is not diaphoretic. No erythema. No pallor.  Psychiatric: She has a normal mood and affect. Her behavior is normal. Judgment and thought content normal.  Vitals reviewed.   Filed Vitals:   12/10/15 1638  BP: 122/78  Pulse: 64  Height: 5\' 4"  (1.626 m)  Weight: 183 lb 3.2 oz (83.099 kg)  SpO2: 98%        Assessment:       ICD-9-CM ICD-10-CM   1. Embolism, pulmonary with infarction (HCC) 415.19 I26.99 D-dimer, quantitative (not at [facility])       Plan:     She is doing well 8 months on Xarelto. She has lost weight. Her ITP continues to be an issue. She has periodic flares. Her hematologist has reduced her Xarelto dose to 15 mg per day presumably because of ITP flareups and going for a safe range. I will check a d-dimer to ensure this is normal and the low doses overall does okay. I will check on the plan of the lower Xarelto with Dr. Johnette Abraham as well  Dr. Brand Males, M.D., [facility].C.P Pulmonary and Critical Care Medicine Staff Physician [place] [facility] Pager: [phone], If no answer or between  15:00h - 7:00h: call [phone]  12/10/2015 5:17 PM

## 2015-12-10 NOTE — Addendum Note (Signed)
Addended by: Beckie Busing on: 12/10/2015 05:24 PM   Modules accepted: Orders

## 2015-12-10 NOTE — Patient Instructions (Addendum)
ICD-9-CM ICD-10-CM   1. Embolism, pulmonary with infarction ([place]) 415.19 I26.99     Do d-dimer blood test - will call with results Wil discuss with Dr Marin Olp based on d-dimer results about safety/efficacy of 15mg  per day of xarelto  followup 1 year or sooner if needed

## 2015-12-11 ENCOUNTER — Telehealth: Payer: Self-pay | Admitting: Internal Medicine

## 2015-12-11 LAB — D-DIMER, QUANTITATIVE: D-Dimer, Quant: 0.22 ug/mL-FEU (ref 0.00–0.48)

## 2015-12-11 NOTE — Telephone Encounter (Signed)
Hi [name]  I noticed that you reduced Xarelto dose to the lower 15mg  per day on Janice Martin. Is this because of her ITP or more a prophylaxis dose? In any event her latest d-dimer is normal. So I guess 15mg  per day is fine.  Please let me know  Thanks  Dr. Brand Males, M.D., [facility].C.P Pulmonary and Critical Care Medicine Staff Physician [place] [facility] Pager: [phone], If no answer or between  15:00h - 7:00h: call [phone]  12/11/2015 4:49 PM   g

## 2015-12-14 ENCOUNTER — Telehealth: Payer: Self-pay | Admitting: Internal Medicine

## 2015-12-14 NOTE — Telephone Encounter (Signed)
Daneil Dan  Let Kelton [place] know that d-dimer is normal. So the lower xarelto 15mg  is probably ok. I have written to Dr Marin Olp few days ago but have not heard from him. I suggest she talk to him abuo it to be doubly sure. I will recheck d-dimer at fu in 1 year  Dr. Brand Males, M.D., [facility].C.P Pulmonary and Critical Care Medicine Staff Physician [place] [facility] Pager: [phone], If no answer or between  15:00h - 7:00h: call [phone]  12/14/2015 2:27 PM

## 2015-12-18 NOTE — Telephone Encounter (Signed)
Called and spoke to pt. Informed her of the results and recs per MR. Pt verbalized understanding and denied any further questions or concerns at this time.   

## 2015-12-21 ENCOUNTER — Ambulatory Visit: Payer: 59

## 2015-12-26 ENCOUNTER — Ambulatory Visit (INDEPENDENT_AMBULATORY_CARE_PROVIDER_SITE_OTHER): Payer: 59 | Admitting: Internal Medicine

## 2015-12-26 ENCOUNTER — Encounter: Payer: Self-pay | Admitting: Internal Medicine

## 2015-12-26 VITALS — BP 114/80 | HR 69 | Temp 98.0°F | Resp 16 | Wt 180.0 lb

## 2015-12-26 DIAGNOSIS — I2692 Saddle embolus of pulmonary artery without acute cor pulmonale: Secondary | ICD-10-CM | POA: Diagnosis not present

## 2015-12-26 DIAGNOSIS — I82409 Acute embolism and thrombosis of unspecified deep veins of unspecified lower extremity: Secondary | ICD-10-CM

## 2015-12-26 DIAGNOSIS — I2782 Chronic pulmonary embolism: Secondary | ICD-10-CM

## 2015-12-26 DIAGNOSIS — F411 Generalized anxiety disorder: Secondary | ICD-10-CM | POA: Diagnosis not present

## 2015-12-26 DIAGNOSIS — D693 Immune thrombocytopenic purpura: Secondary | ICD-10-CM

## 2015-12-26 DIAGNOSIS — I10 Essential (primary) hypertension: Secondary | ICD-10-CM

## 2015-12-26 MED ORDER — PAROXETINE HCL 10 MG PO TABS: 10.0000 mg | ORAL_TABLET | Freq: Every day | ORAL | Status: DC

## 2015-12-26 NOTE — Assessment & Plan Note (Signed)
Following with hematology CBC monitored closely Goes on decadron taper for a few days as needed for ITP

## 2015-12-26 NOTE — Assessment & Plan Note (Signed)
Controlled, improved Continue paxil 10 mg daily

## 2015-12-26 NOTE — Patient Instructions (Signed)
  No immunizations administered today.   Medications reviewed and updated.  No changes recommended at this time.  Your prescription(s) have been submitted to your pharmacy. Please take as directed and contact our office if you believe you are having problem(s) with the medication(s).   Please followup annually for a physical

## 2015-12-26 NOTE — Progress Notes (Signed)
Pre visit review using our clinic review tool, if applicable. No additional management support is needed unless otherwise documented below in the visit note. 

## 2015-12-26 NOTE — Assessment & Plan Note (Signed)
DVT / PE in 03/2015 - in ICU for thrombolysis in setting of severe ITP flare Now on chronic xarelto for high risk recurrence Follow with heme/onc

## 2015-12-26 NOTE — Assessment & Plan Note (Signed)
Well controlled continue current medications  

## 2015-12-26 NOTE — Progress Notes (Signed)
Subjective:    Patient ID: Janice Martin, female    DOB: 30-Aug-1961, 55 y.o.   MRN: [id number]  HPI She is here to establish with a new pcp.   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  She works in radiation oncology.   She has had a lot of increased stress/anxeity in the past several months.    Anxiety: She is taking her medication daily as prescribed. She denies any side effects from the medication. She feels her anxiety is well controlled and she is happy with her current dose of medication.   DVT, PE:  She had a PE/DVT in June 2016.  It was thought to be related to several falls.  She was advised to be on the xarelto lifelong due to her ITP and elevated platelets with treatment.  ITP: She follows with heme/onc.  She has frequent blood work.  About once every 6 months or so she needs to take decadron taper for several days to improve her low platelets. Her platelets then increase with the decadron.   Hypertension: She is taking her medication daily. She is compliant with a low sodium diet.  She denies chest pain, palpitations, edema, shortness of breath and regular headaches. She is exercising regularly.  She does not monitor her blood pressure at home.     Medications and allergies reviewed with patient and updated if appropriate.  Patient Active Problem List   Diagnosis Date Noted  . Embolism, pulmonary with infarction ([place]) 12/10/2015  . Hyperglycemia 10/18/2015  . DVT of leg (deep venous thrombosis) ([place]) 04/04/2015  . Severe thrombocytopenia ([place]) 03/25/2015  . Saddle embolus of pulmonary artery ([place]) 03/24/2015  . MENORRHAGIA 06/26/2010  . LEUKOCYTOSIS UNSPECIFIED 06/17/2010  . IMMUNE THROMBOCYTOPENIC PURPURA 05/10/2010  . Anxiety state 05/10/2010  . Essential hypertension 05/10/2010    Current Outpatient Prescriptions on File Prior to Visit  Medication Sig Dispense Refill  . ALPRAZolam (XANAX) 0.25 MG tablet Take 1 tablet (0.25 mg total) by mouth 2 (two) times daily as needed  for anxiety. 30 tablet 1  . nebivolol (BYSTOLIC) 5 MG tablet Take 1 tablet (5 mg total) by mouth daily. 90 tablet 3  . PARoxetine (PAXIL) 10 MG tablet Take 1 tablet (10 mg total) by mouth daily. 90 tablet 1  . Rivaroxaban (XARELTO) 15 MG TABS tablet Take 1 tablet (15 mg total) by mouth daily with supper. 90 tablet 3  . valsartan-hydrochlorothiazide (DIOVAN-HCT) 320-25 MG tablet Take 1 tablet by mouth daily. 90 tablet 3  . vitamin B-12 (CYANOCOBALAMIN) 100 MCG tablet Take 100 mcg by mouth daily.     No current facility-administered medications on file prior to visit.    Past Medical History  Diagnosis Date  . ANEMIA-IRON DEFICIENCY   . Immune thrombocytopenic purpura ([place]) 2003 dx    s/p rituxan 06/2012 - now remission  . LEUKOCYTOSIS UNSPECIFIED   . Anxiety state, unspecified   . HYPERTENSION   . DVT of leg (deep venous thrombosis) ([place]) 04/04/2015  . Pulmonary embolism [facility]) June 2016    chronic anticoag due to high risk recurrence    Past Surgical History  Procedure Laterality Date  . Cholecystectomy    . Spleenectomy    . Cesarean section      x's 2    Social History   Social History  . Marital Status: Married    Spouse Name: N/A  . Number of Children: N/A  . Years of Education: N/A   Social History Main Topics  .  Smoking status: Never Smoker   . Smokeless tobacco: Never Used     Comment: never used product  . Alcohol Use: No  . Drug Use: No  . Sexual Activity: Not on file   Other Topics Concern  . Not on file   Social History Narrative    Family History  Problem Relation Age of Onset  . Arthritis Mother   . Arthritis Father   . Mental illness Father     severe depression  . Hypertension Other     Parent  . Heart disease Other     Grandparent  . Stroke Other     grandparent  . Multiple sclerosis Sister   . Diabetes Mother     Review of Systems  Constitutional: Negative for fever and chills.  Respiratory: Negative for cough, shortness of breath  and wheezing.   Cardiovascular: Negative for chest pain, palpitations and leg swelling.  Neurological: Negative for light-headedness and headaches.       Objective:   Filed Vitals:   12/26/15 1546  BP: 114/80  Pulse: 69  Temp: 98 F (36.7 C)  Resp: 16   Filed Weights   12/26/15 1546  Weight: 180 lb (81.647 kg)   Body mass index is 30.88 kg/(m^2).   Physical Exam Constitutional: Appears well-developed and well-nourished. No distress.  Neck: Neck supple. No tracheal deviation present. No thyromegaly present.  No carotid bruit. No cervical adenopathy.   Cardiovascular: Normal rate, regular rhythm and normal heart sounds.   No murmur heard.  No edema Pulmonary/Chest: Effort normal and breath sounds normal. No respiratory distress. No wheezes.  Psych: normal affect and mood      Assessment & Plan:     See Problem List for Assessment and Plan of chronic medical problems.

## 2015-12-27 ENCOUNTER — Ambulatory Visit (HOSPITAL_BASED_OUTPATIENT_CLINIC_OR_DEPARTMENT_OTHER): Payer: 59 | Admitting: Hematology & Oncology

## 2015-12-27 ENCOUNTER — Encounter: Payer: Self-pay | Admitting: Hematology & Oncology

## 2015-12-27 ENCOUNTER — Other Ambulatory Visit (HOSPITAL_BASED_OUTPATIENT_CLINIC_OR_DEPARTMENT_OTHER): Payer: 59

## 2015-12-27 VITALS — BP 147/72 | HR 65 | Temp 97.7°F | Resp 16 | Ht 64.0 in | Wt 179.0 lb

## 2015-12-27 DIAGNOSIS — D693 Immune thrombocytopenic purpura: Secondary | ICD-10-CM

## 2015-12-27 DIAGNOSIS — I2699 Other pulmonary embolism without acute cor pulmonale: Secondary | ICD-10-CM | POA: Diagnosis not present

## 2015-12-27 DIAGNOSIS — I82401 Acute embolism and thrombosis of unspecified deep veins of right lower extremity: Secondary | ICD-10-CM

## 2015-12-27 LAB — CBC WITH DIFFERENTIAL (CANCER CENTER ONLY)
BASO#: 0.1 10*3/uL (ref 0.0–0.2)
BASO%: 0.9 % (ref 0.0–2.0)
EOS%: 5.4 % (ref 0.0–7.0)
Eosinophils Absolute: 0.5 10*3/uL (ref 0.0–0.5)
HCT: 38.6 % (ref 34.8–46.6)
HGB: 13.3 g/dL (ref 11.6–15.9)
LYMPH#: 3.9 10*3/uL — ABNORMAL HIGH (ref 0.9–3.3)
LYMPH%: 43.3 % (ref 14.0–48.0)
MCH: 31.7 pg (ref 26.0–34.0)
MCHC: 34.5 g/dL (ref 32.0–36.0)
MCV: 92 fL (ref 81–101)
MONO#: 0.8 10*3/uL (ref 0.1–0.9)
MONO%: 9.2 % (ref 0.0–13.0)
NEUT#: 3.7 10*3/uL (ref 1.5–6.5)
NEUT%: 41.2 % (ref 39.6–80.0)
PLATELETS: 289 10*3/uL (ref 145–400)
RBC: 4.19 10*6/uL (ref 3.70–5.32)
RDW: 13.9 % (ref 11.1–15.7)
WBC: 8.9 10*3/uL (ref 3.9–10.0)

## 2015-12-27 NOTE — Progress Notes (Signed)
Hematology and Oncology Follow Up Visit  Janice Martin [id number] Dec 29, 1960 55 y.o. 12/27/2015   Principle Diagnosis:   Chronic immune thrombocytopenia-recurrent  Submassive pulmonary embolism, with right lower extremity thrombus  Current Therapy:    Xarelto 15 mg by mouth daily     Interim History:  [name] is back for followup. She continues to do well. 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 She is busy.  Her platelets started to go down back in early February. Her platelets go down to 82,000. As such, we got her on a short course of steroids. She responds very well to Decadron. She was on Decadron probably for about 5 or 6 days. With that, her platelets came back up quite nicely. Her platelet count went up to a was 400,000.  She and her husband are looking for to going to [place] in early May for their anniversary. She is driving up and not flying up. She's worried about flying with the pulmonary embolism. I told her that the pulmonary embolism has resolved. This was found to be resolved on a CT angiogram of the chest back in September 2016.  I told her that if she drives, and she is going to have to get out every couple hours to walk around. She will have to drink a lot of water.  She is on Xarelto. I have her on 15 mg a day. I think this is reasonable for her.  More good news is that her son's wife is going to have a baby. She had a miscarriage with her last pregnancy. Hopefully, she will carry this one to term.  Overall, her performance status is ECOG 1.   Medications:  Current outpatient prescriptions:  .  nebivolol (BYSTOLIC) 5 MG tablet, Take 1 tablet (5 mg total) by mouth daily., Disp: 90 tablet, Rfl: 3 .  PARoxetine (PAXIL) 10 MG tablet, Take 1 tablet (10 mg total) by mouth daily., Disp: 90 tablet, Rfl: 1 .  Rivaroxaban (XARELTO) 15 MG TABS tablet, Take 1 tablet (15 mg total) by mouth daily with supper., Disp: 90 tablet, Rfl: 3 .   valsartan-hydrochlorothiazide (DIOVAN-HCT) 320-25 MG tablet, Take 1 tablet by mouth daily., Disp: 90 tablet, Rfl: 3 .  vitamin B-12 (CYANOCOBALAMIN) 100 MCG tablet, Take 100 mcg by mouth daily., Disp: , Rfl:   Allergies:  Allergies  Allergen Reactions  . Shellfish Allergy Anaphylaxis  . Influenza Vaccines     Per pt can't take mess up with her immune system  . Morphine Other (See Comments)    Burns really bad    Past Medical History, Surgical history, Social history, and Family History were reviewed and updated.  Review of Systems: As above  Physical Exam:  height is 5\' 4"  (1.626 m) and weight is 179 lb (81.194 kg). Her oral temperature is 97.7 F (36.5 C). Her blood pressure is 147/72 and her pulse is 65. Her respiration is 16.   Well-developed and well-nourished white female. Head and neck exam shows no ocular or oral lesions. There is no adenopathy of the neck. Lungs are clear bilaterally. She has no rales, wheezes or rhonchi. Cardiac exam is regular rate and rhythm with no murmurs rubs or bruits. Abdomen has a well-healed splenectomy scar. She has no fluid wave. There is no palpable liver edge. Back exam no tenderness over the spine ribs or hips. Extremities no clubbing cyanosis or edema. I cannot palpate any venous cord in the right leg. She  has a negative Homans sign in the right leg. She has good pulses in her distal extremities. Skin exam shows no rashes ecchymoses or petechia. Neurological exam is nonfocal.  Lab Results  Component Value Date   WBC 8.9 12/27/2015   HGB 13.3 12/27/2015   HCT 38.6 12/27/2015   MCV 92 12/27/2015   PLT 289 12/27/2015     Chemistry      Component Value Date/Time   NA 140 12/07/2015 1252   NA 137 10/18/2015 1114   NA 141 08/20/2015 1118   K 3.4* 12/07/2015 1252   K 4.2 10/18/2015 1114   K 3.7 08/20/2015 1118   CL 99 10/18/2015 1114   CL 97* 08/20/2015 1118   CO2 30* 12/07/2015 1252   CO2 32 10/18/2015 1114   CO2 27 08/20/2015 1118    BUN 26.7* 12/07/2015 1252   BUN 15 10/18/2015 1114   BUN 10 08/20/2015 1118   CREATININE 1.1 12/07/2015 1252   CREATININE 0.95 10/18/2015 1114   CREATININE 0.8 08/20/2015 1118      Component Value Date/Time   CALCIUM 8.8 12/07/2015 1252   CALCIUM 9.6 10/18/2015 1114   CALCIUM 8.9 08/20/2015 1118   ALKPHOS 77 12/07/2015 1252   ALKPHOS 91 10/18/2015 1114   ALKPHOS 77 08/20/2015 1118   AST 12 12/07/2015 1252   AST 16 10/18/2015 1114   AST 22 08/20/2015 1118   ALT 22 12/07/2015 1252   ALT 19 10/18/2015 1114   ALT 24 08/20/2015 1118   BILITOT 0.51 12/07/2015 1252   BILITOT 0.4 10/18/2015 1114   BILITOT 0.60 08/20/2015 1118       Impression and Plan: Janice Martin is a 55 year old white female. She has a history of relapsed chronic immune thrombocytopenia. We treated her with Rituxan. She responded very well. She got treated back in October of 2014.  Her platelet count is much better. She was response to Decadron.   As far as the pulmonary embolism is concerned, I really don't think that is going be much of an issue at this point. She is doing well with the Xarelto.  We will check her blood counts every 3 weeks now.  I will see her back in 6 weeks.  [name], MD 3/9/20174:37 PM

## 2016-01-24 MED FILL — BYSTOLIC 5 MG TABLET: 5 | 90 days supply | Qty: 90 | Fill #1

## 2016-01-24 MED FILL — VALSARTAN-HCTZ 320-25 MG TA: 320-25 | 90 days supply | Qty: 90 | Fill #1

## 2016-02-14 ENCOUNTER — Other Ambulatory Visit (HOSPITAL_BASED_OUTPATIENT_CLINIC_OR_DEPARTMENT_OTHER): Payer: 59

## 2016-02-14 ENCOUNTER — Encounter: Payer: Self-pay | Admitting: Hematology & Oncology

## 2016-02-14 ENCOUNTER — Ambulatory Visit (HOSPITAL_BASED_OUTPATIENT_CLINIC_OR_DEPARTMENT_OTHER): Payer: 59 | Admitting: Hematology & Oncology

## 2016-02-14 VITALS — BP 150/77 | HR 83 | Temp 97.6°F | Resp 16 | Ht 64.0 in | Wt 182.0 lb

## 2016-02-14 DIAGNOSIS — I2699 Other pulmonary embolism without acute cor pulmonale: Secondary | ICD-10-CM | POA: Diagnosis not present

## 2016-02-14 DIAGNOSIS — D693 Immune thrombocytopenic purpura: Secondary | ICD-10-CM

## 2016-02-14 DIAGNOSIS — I2782 Chronic pulmonary embolism: Secondary | ICD-10-CM

## 2016-02-14 DIAGNOSIS — I2692 Saddle embolus of pulmonary artery without acute cor pulmonale: Secondary | ICD-10-CM

## 2016-02-14 LAB — CBC WITH DIFFERENTIAL (CANCER CENTER ONLY)
BASO#: 0.1 10*3/uL (ref 0.0–0.2)
BASO%: 0.8 % (ref 0.0–2.0)
EOS%: 6.6 % (ref 0.0–7.0)
Eosinophils Absolute: 0.7 10*3/uL — ABNORMAL HIGH (ref 0.0–0.5)
HEMATOCRIT: 38.9 % (ref 34.8–46.6)
HEMOGLOBIN: 13.5 g/dL (ref 11.6–15.9)
LYMPH#: 3.7 10*3/uL — AB (ref 0.9–3.3)
LYMPH%: 36 % (ref 14.0–48.0)
MCH: 31.8 pg (ref 26.0–34.0)
MCHC: 34.7 g/dL (ref 32.0–36.0)
MCV: 92 fL (ref 81–101)
MONO#: 0.9 10*3/uL (ref 0.1–0.9)
MONO%: 9.1 % (ref 0.0–13.0)
NEUT#: 4.9 10*3/uL (ref 1.5–6.5)
NEUT%: 47.5 % (ref 39.6–80.0)
Platelets: 236 10*3/uL (ref 145–400)
RBC: 4.25 10*6/uL (ref 3.70–5.32)
RDW: 13.4 % (ref 11.1–15.7)
WBC: 10.2 10*3/uL — ABNORMAL HIGH (ref 3.9–10.0)

## 2016-02-14 LAB — CHCC SATELLITE - SMEAR

## 2016-02-14 MED ORDER — RIVAROXABAN 10 MG PO TABS: 10.0000 mg | ORAL_TABLET | Freq: Every day | ORAL | Status: DC

## 2016-02-14 NOTE — Progress Notes (Signed)
Hematology and Oncology Follow Up Visit  Janice Martin [id number] 09-Apr-1961 55 y.o. 02/14/2016   Principle Diagnosis:   Chronic immune thrombocytopenia-recurrent  Submassive pulmonary embolism, with right lower extremity thrombus  Current Therapy:    Xarelto 10 mg by mouth daily     Interim History:  [name] is back for followup. She continues to do well. 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 She is busy.   She has had no problems with petechia. There is no bleeding.  I will cut her Xarelto down to 10 mg a day now. A recent study just came out that show that 10 mg of Xarelto daily as a maintenance therapy is quite effective and decreases the risk of thrombus recurrence by 70%. I did this would be very reasonable for her.  She clearly knows what to watch out for. She takes steroids when she thinks that her blood count is dropping.  She's had no issues with cough or shortness of breath. She's had no fever.  Unfortunately her daughter lost a baby at 6 weeks. this is been a little bit upsetting for her.   Overall, her performance status is ECOG 1.   Medications:  Current outpatient prescriptions:  .  nebivolol (BYSTOLIC) 5 MG tablet, Take 1 tablet (5 mg total) by mouth daily., Disp: 90 tablet, Rfl: 3 .  Rivaroxaban (XARELTO) 10 MG TABS tablet, Take 1 tablet (10 mg total) by mouth daily with supper., Disp: 90 tablet, Rfl: 3 .  valsartan-hydrochlorothiazide (DIOVAN-HCT) 320-25 MG tablet, Take 1 tablet by mouth daily., Disp: 90 tablet, Rfl: 3 .  vitamin B-12 (CYANOCOBALAMIN) 100 MCG tablet, Take 100 mcg by mouth daily., Disp: , Rfl:   Allergies:  Allergies  Allergen Reactions  . Shellfish Allergy Anaphylaxis  . Influenza Vaccines     Per pt can't take mess up with her immune system  . Morphine Other (See Comments)    Burns really bad    Past Medical History, Surgical history, Social history, and Family History were reviewed and updated.  Review of  Systems: As above  Physical Exam:  height is 5\' 4"  (1.626 m) and weight is 182 lb (82.555 kg). Her oral temperature is 97.6 F (36.4 C). Her blood pressure is 150/77 and her pulse is 83. Her respiration is 16.   Well-developed and well-nourished white female. Head and neck exam shows no ocular or oral lesions. There is no adenopathy of the neck. Lungs are clear bilaterally. She has no rales, wheezes or rhonchi. Cardiac exam is regular rate and rhythm with no murmurs rubs or bruits. Abdomen has a well-healed splenectomy scar. She has no fluid wave. There is no palpable liver edge. Back exam no tenderness over the spine ribs or hips. Extremities no clubbing cyanosis or edema. I cannot palpate any venous cord in the right leg. She has a negative Homans sign in the right leg. She has good pulses in her distal extremities. Skin exam shows no rashes ecchymoses or petechia. Neurological exam is nonfocal.  Lab Results  Component Value Date   WBC 10.2* 02/14/2016   HGB 13.5 02/14/2016   HCT 38.9 02/14/2016   MCV 92 02/14/2016   PLT 236 Platelet count consistent in citrate 02/14/2016     Chemistry      Component Value Date/Time   NA 140 12/07/2015 1252   NA 137 10/18/2015 1114   NA 141 08/20/2015 1118   K 3.4* 12/07/2015 1252   K 4.2 10/18/2015  1114   K 3.7 08/20/2015 1118   CL 99 10/18/2015 1114   CL 97* 08/20/2015 1118   CO2 30* 12/07/2015 1252   CO2 32 10/18/2015 1114   CO2 27 08/20/2015 1118   BUN 26.7* 12/07/2015 1252   BUN 15 10/18/2015 1114   BUN 10 08/20/2015 1118   CREATININE 1.1 12/07/2015 1252   CREATININE 0.95 10/18/2015 1114   CREATININE 0.8 08/20/2015 1118      Component Value Date/Time   CALCIUM 8.8 12/07/2015 1252   CALCIUM 9.6 10/18/2015 1114   CALCIUM 8.9 08/20/2015 1118   ALKPHOS 77 12/07/2015 1252   ALKPHOS 91 10/18/2015 1114   ALKPHOS 77 08/20/2015 1118   AST 12 12/07/2015 1252   AST 16 10/18/2015 1114   AST 22 08/20/2015 1118   ALT 22 12/07/2015 1252   ALT  19 10/18/2015 1114   ALT 24 08/20/2015 1118   BILITOT 0.51 12/07/2015 1252   BILITOT 0.4 10/18/2015 1114   BILITOT 0.60 08/20/2015 1118       Impression and Plan: Janice Martin is a 55 year old white female. She has a history of relapsed chronic immune thrombocytopenia. We treated her with Rituxan. She responded very well. She got treated back in October of 2014.  Her platelet count is much better. She response well to Decadron.   As far as the pulmonary embolism is concerned, I really don't think that is going be much of an issue at this point. She is doing well with the Xarelto.  I will see her back in 8 weeks.  [name], MD 4/27/20173:55 PM

## 2016-02-15 MED FILL — XARELTO 15 MG TABLET: 15 | 90 days supply | Qty: 90 | Fill #1

## 2016-03-01 DIAGNOSIS — H25043 Posterior subcapsular polar age-related cataract, bilateral: Secondary | ICD-10-CM | POA: Diagnosis not present

## 2016-04-16 ENCOUNTER — Encounter: Payer: Self-pay | Admitting: Hematology & Oncology

## 2016-04-16 ENCOUNTER — Ambulatory Visit (HOSPITAL_BASED_OUTPATIENT_CLINIC_OR_DEPARTMENT_OTHER): Payer: 59 | Admitting: Hematology & Oncology

## 2016-04-16 ENCOUNTER — Other Ambulatory Visit (HOSPITAL_BASED_OUTPATIENT_CLINIC_OR_DEPARTMENT_OTHER): Payer: 59

## 2016-04-16 VITALS — BP 135/73 | HR 76 | Temp 97.5°F | Resp 16 | Ht 64.0 in | Wt 181.0 lb

## 2016-04-16 DIAGNOSIS — I82401 Acute embolism and thrombosis of unspecified deep veins of right lower extremity: Secondary | ICD-10-CM | POA: Diagnosis not present

## 2016-04-16 DIAGNOSIS — I2602 Saddle embolus of pulmonary artery with acute cor pulmonale: Secondary | ICD-10-CM

## 2016-04-16 DIAGNOSIS — I2692 Saddle embolus of pulmonary artery without acute cor pulmonale: Secondary | ICD-10-CM

## 2016-04-16 DIAGNOSIS — I2782 Chronic pulmonary embolism: Secondary | ICD-10-CM

## 2016-04-16 DIAGNOSIS — I2699 Other pulmonary embolism without acute cor pulmonale: Secondary | ICD-10-CM | POA: Diagnosis not present

## 2016-04-16 DIAGNOSIS — D693 Immune thrombocytopenic purpura: Secondary | ICD-10-CM

## 2016-04-16 LAB — COMPREHENSIVE METABOLIC PANEL (CC13)
A/G RATIO: 1.7 (ref 1.2–2.2)
ALK PHOS: 96 IU/L (ref 39–117)
ALT: 16 IU/L (ref 0–32)
AST: 15 IU/L (ref 0–40)
Albumin, Serum: 4.3 g/dL (ref 3.5–5.5)
BUN/Creatinine Ratio: 15 (ref 9–23)
BUN: 17 mg/dL (ref 6–24)
Bilirubin Total: 0.3 mg/dL (ref 0.0–1.2)
CALCIUM: 9.6 mg/dL (ref 8.7–10.2)
Carbon Dioxide, Total: 33 mmol/L — ABNORMAL HIGH (ref 18–29)
Chloride, Ser: 97 mmol/L (ref 96–106)
Creatinine, Ser: 1.11 mg/dL — ABNORMAL HIGH (ref 0.57–1.00)
GFR calc Af Amer: 66 mL/min/{1.73_m2} (ref >59)
GFR, EST NON AFRICAN AMERICAN: 57 mL/min/{1.73_m2} — AB (ref >59)
Globulin, Total: 2.5 g/dL (ref 1.5–4.5)
Glucose: 107 mg/dL — ABNORMAL HIGH (ref 65–99)
POTASSIUM: 3.8 mmol/L (ref 3.5–5.2)
Sodium: 135 mmol/L (ref 134–144)
Total Protein: 6.8 g/dL (ref 6.0–8.5)

## 2016-04-16 LAB — CBC WITH DIFFERENTIAL (CANCER CENTER ONLY)
BASO#: 0.1 10*3/uL (ref 0.0–0.2)
BASO%: 0.7 % (ref 0.0–2.0)
EOS ABS: 0.7 10*3/uL — AB (ref 0.0–0.5)
EOS%: 6.6 % (ref 0.0–7.0)
HEMATOCRIT: 37.4 % (ref 34.8–46.6)
HEMOGLOBIN: 13.3 g/dL (ref 11.6–15.9)
LYMPH#: 4 10*3/uL — AB (ref 0.9–3.3)
LYMPH%: 40.7 % (ref 14.0–48.0)
MCH: 32.8 pg (ref 26.0–34.0)
MCHC: 35.6 g/dL (ref 32.0–36.0)
MCV: 92 fL (ref 81–101)
MONO#: 0.8 10*3/uL (ref 0.1–0.9)
MONO%: 8.2 % (ref 0.0–13.0)
NEUT%: 43.8 % (ref 39.6–80.0)
NEUTROS ABS: 4.3 10*3/uL (ref 1.5–6.5)
Platelets: 173 10*3/uL (ref 145–400)
RBC: 4.06 10*6/uL (ref 3.70–5.32)
RDW: 13.3 % (ref 11.1–15.7)
WBC: 9.9 10*3/uL (ref 3.9–10.0)

## 2016-04-16 MED ORDER — RIVAROXABAN 15 MG PO TABS: 15.0000 mg | ORAL_TABLET | Freq: Every day | ORAL | Status: DC

## 2016-04-16 NOTE — Progress Notes (Signed)
Hematology and Oncology Follow Up Visit  Janice Martin [id number] 17-Oct-1961 55 y.o. 04/16/2016   Principle Diagnosis:   Chronic immune thrombocytopenia-recurrent  Submassive pulmonary embolism, with right lower extremity thrombus  Current Therapy:    Xarelto 15 mg by mouth daily     Interim History:  [name] is back for followup. She continues to do well. 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 She is busy. There've been a lot of changes in her department. She is trying to manage all the stress that is going on right now.  She has had no problems with petechia. There is no bleeding.  She enjoys taking the Xarelto at 15 mg. She feels more confident with the 15 mg dose. I don't have any problems with her doing this.  There's been no cough or shortness of breath. She has had no change in bowel or bladder habits.   Her last mammogram was in October.   She has had no rashes.   She and her husband plan on going to [place] for a little vacation. She is again 4 to this.   Overall, her performance status is ECOG 1.   Medications:  Current outpatient prescriptions:  .  nebivolol (BYSTOLIC) 5 MG tablet, Take 1 tablet (5 mg total) by mouth daily., Disp: 90 tablet, Rfl: 3 .  Rivaroxaban (XARELTO) 10 MG TABS tablet, Take 1 tablet (10 mg total) by mouth daily with supper., Disp: 90 tablet, Rfl: 3 .  valsartan-hydrochlorothiazide (DIOVAN-HCT) 320-25 MG tablet, Take 1 tablet by mouth daily., Disp: 90 tablet, Rfl: 3 .  vitamin B-12 (CYANOCOBALAMIN) 100 MCG tablet, Take 100 mcg by mouth daily., Disp: , Rfl:   Allergies:  Allergies  Allergen Reactions  . Shellfish Allergy Anaphylaxis  . Influenza Vaccines     Per pt can't take mess up with her immune system  . Morphine Other (See Comments)    Burns really bad    Past Medical History, Surgical history, Social history, and Family History were reviewed and updated.  Review of Systems: As above  Physical  Exam:  height is 5\' 4"  (1.626 m) and weight is 181 lb (82.101 kg). Her oral temperature is 97.5 F (36.4 C). Her blood pressure is 135/73 and her pulse is 76. Her respiration is 16.   Well-developed and well-nourished white female. Head and neck exam shows no ocular or oral lesions. There is no adenopathy of the neck. Lungs are clear bilaterally. She has no rales, wheezes or rhonchi. Cardiac exam is regular rate and rhythm with no murmurs rubs or bruits. Abdomen has a well-healed splenectomy scar. She has no fluid wave. There is no palpable liver edge. Back exam no tenderness over the spine ribs or hips. Extremities no clubbing cyanosis or edema. I cannot palpate any venous cord in the right leg. She has a negative Homans sign in the right leg. She has good pulses in her distal extremities. Skin exam shows no rashes ecchymoses or petechia. Neurological exam is nonfocal.  Lab Results  Component Value Date   WBC 9.9 04/16/2016   HGB 13.3 04/16/2016   HCT 37.4 04/16/2016   MCV 92 04/16/2016   PLT 173 04/16/2016     Chemistry      Component Value Date/Time   NA 140 12/07/2015 1252   NA 137 10/18/2015 1114   NA 141 08/20/2015 1118   K 3.4* 12/07/2015 1252   K 4.2 10/18/2015 1114   K 3.7 08/20/2015 1118  CL 99 10/18/2015 1114   CL 97* 08/20/2015 1118   CO2 30* 12/07/2015 1252   CO2 32 10/18/2015 1114   CO2 27 08/20/2015 1118   BUN 26.7* 12/07/2015 1252   BUN 15 10/18/2015 1114   BUN 10 08/20/2015 1118   CREATININE 1.1 12/07/2015 1252   CREATININE 0.95 10/18/2015 1114   CREATININE 0.8 08/20/2015 1118      Component Value Date/Time   CALCIUM 8.8 12/07/2015 1252   CALCIUM 9.6 10/18/2015 1114   CALCIUM 8.9 08/20/2015 1118   ALKPHOS 77 12/07/2015 1252   ALKPHOS 91 10/18/2015 1114   ALKPHOS 77 08/20/2015 1118   AST 12 12/07/2015 1252   AST 16 10/18/2015 1114   AST 22 08/20/2015 1118   ALT 22 12/07/2015 1252   ALT 19 10/18/2015 1114   ALT 24 08/20/2015 1118   BILITOT 0.51  12/07/2015 1252   BILITOT 0.4 10/18/2015 1114   BILITOT 0.60 08/20/2015 1118       Impression and Plan: Janice Martin is a 55 year old white female. She has a history of relapsed chronic immune thrombocytopenia. We treated her with Rituxan. She responded very well. She got treated back in October of 2014.  So far, her platelet count is doing well.  She's doing well on the Xarelto. She was found Xarelto for life.  I don't thing we have to do any blood work on her until we see her back.  I will plan to see her back in 8 weeks.  [name], MD 6/28/20174:15 PM

## 2016-04-17 LAB — D-DIMER, QUANTITATIVE (NOT AT ARMC)

## 2016-04-21 MED FILL — VALSARTAN-HCTZ 320-25 MG TA: 320-25 | 90 days supply | Qty: 90 | Fill #2

## 2016-04-21 MED FILL — BYSTOLIC 5 MG TABLET: 5 | 90 days supply | Qty: 90 | Fill #2

## 2016-04-29 IMAGING — CT CT ANGIO CHEST
2 of 6 series · 18 of 36 positions shown · IV contrast (APPLIED)
Comparison: Chest radiograph performed earlier today at [DATE] p.m.

CLINICAL DATA: Acute onset of cough, congestion and productive
cough. Shortness of breath. Elevated D-dimer. Initial encounter.

EXAM:
CT ANGIOGRAPHY CHEST WITH CONTRAST
TECHNIQUE: Multidetector CT imaging of the chest was performed using the
standard protocol during bolus administration of intravenous
contrast. Multiplanar CT image reconstructions and MIPs were
obtained to evaluate the vascular anatomy.
CONTRAST:  100mL OMNIPAQUE IOHEXOL 350 MG/ML SOLN

[Series 5: pe 1.0 b26f · axial · 0.62mm/px · z∈[-96,+162]mm · 17 of 289 slices shown]
[im 15/289  lung]
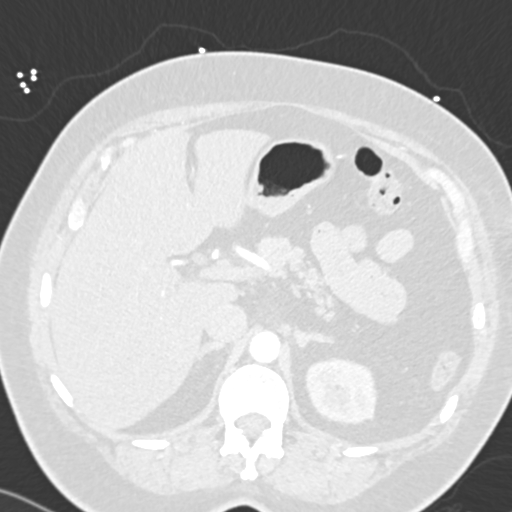
[im 29/289  mediastinal]
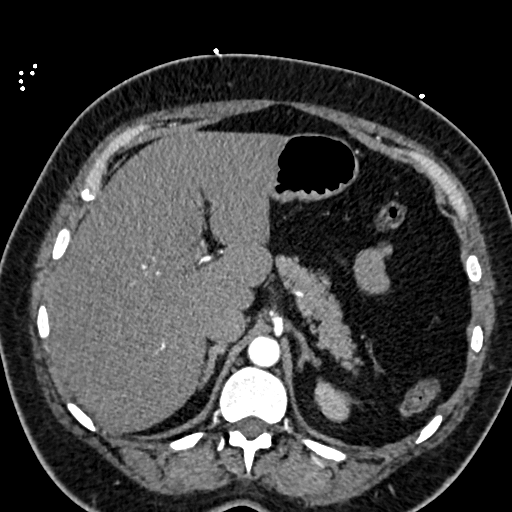
[im 44/289  lung]
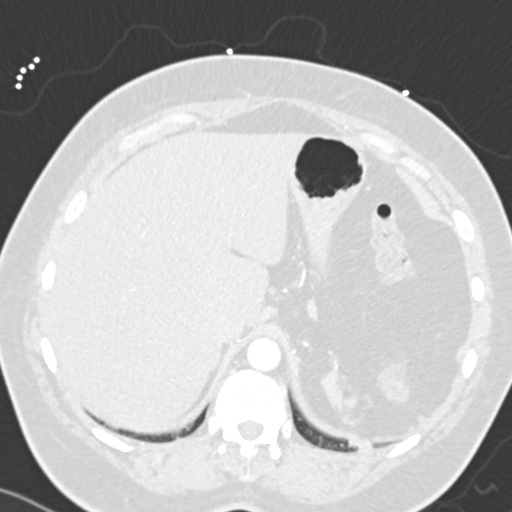
[im 58/289  mediastinal]
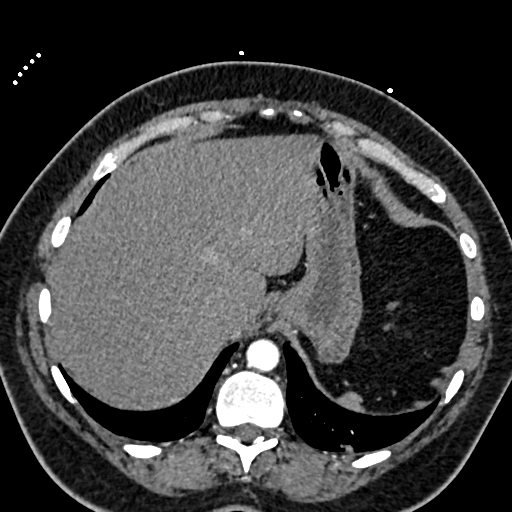
[im 87/289  lung]
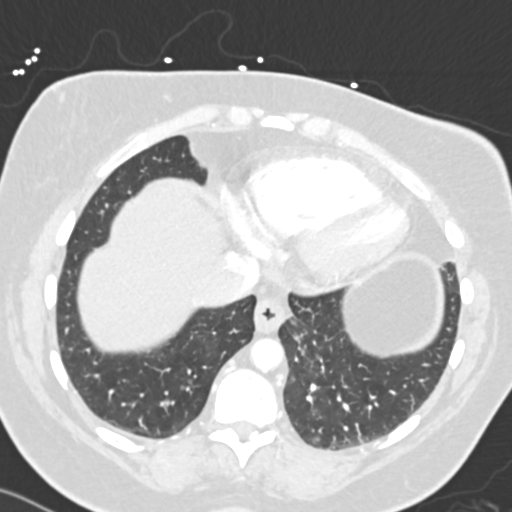
[im 101/289  mediastinal]
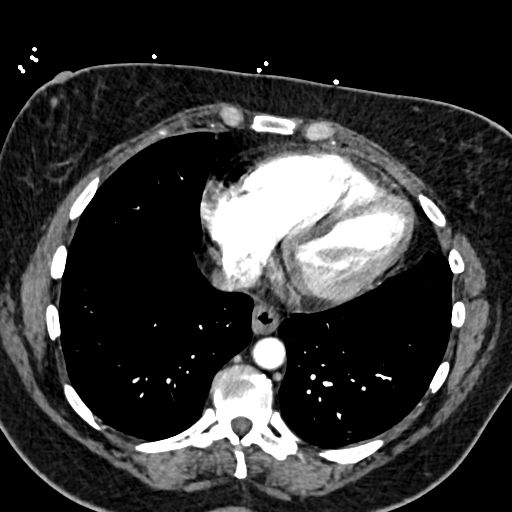
[im 116/289  lung]
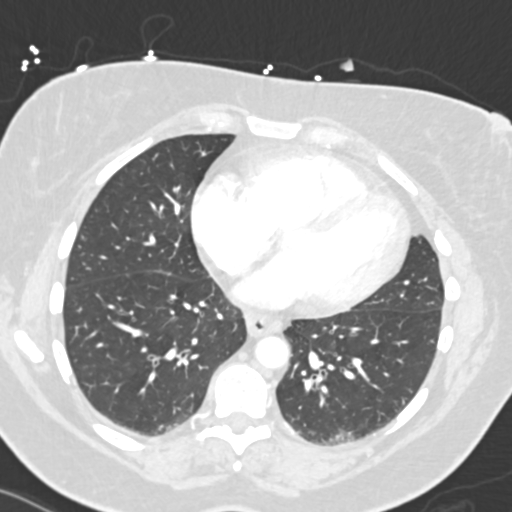
[im 130/289  mediastinal]
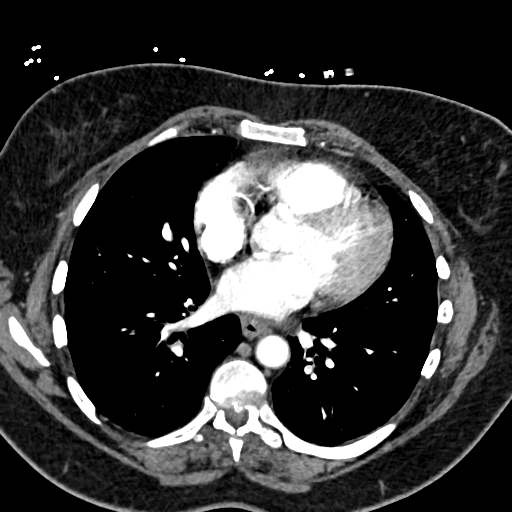
[im 145/289  lung]
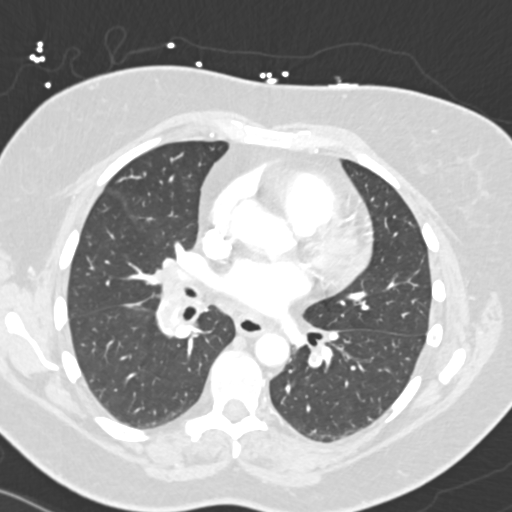
[im 159/289  mediastinal]
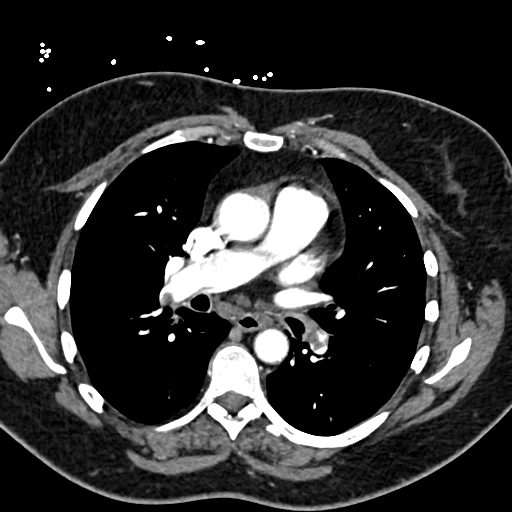
[im 173/289  lung]
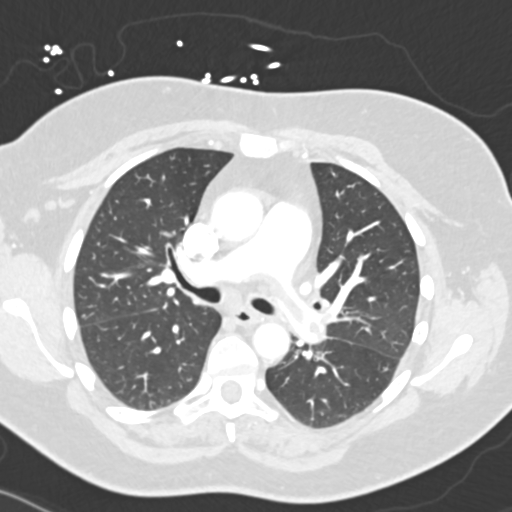
[im 188/289  mediastinal]
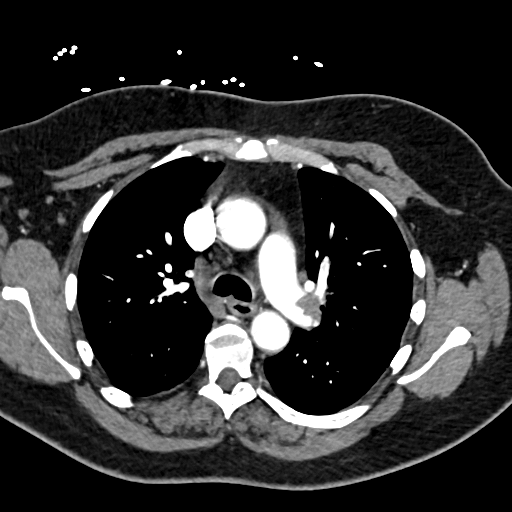
[im 202/289  lung]
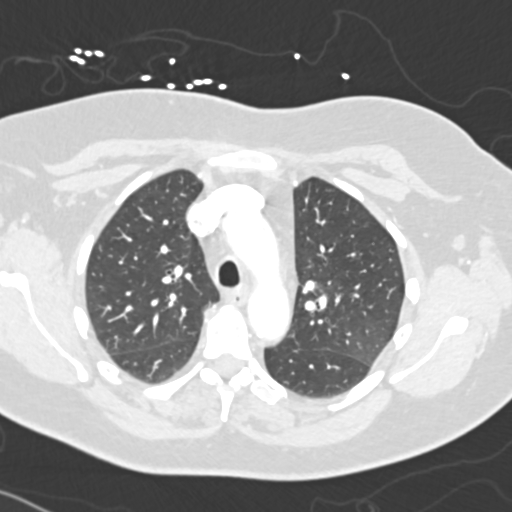
[im 231/289  mediastinal]
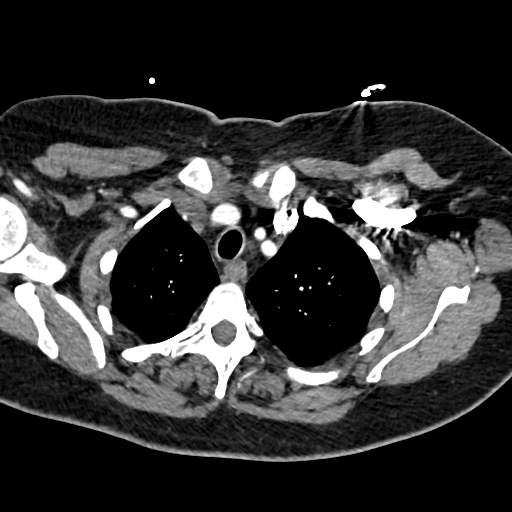
[im 245/289  lung]
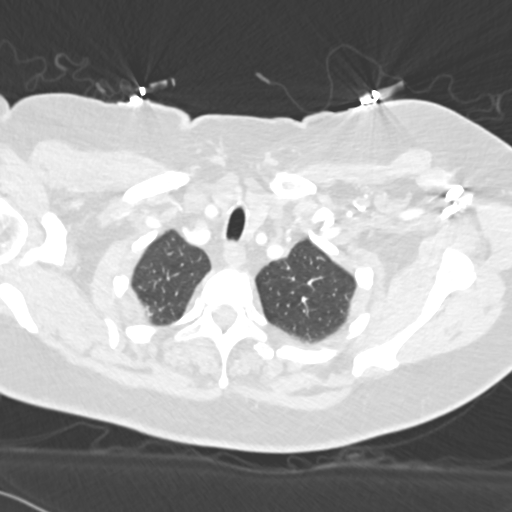
[im 260/289  mediastinal]
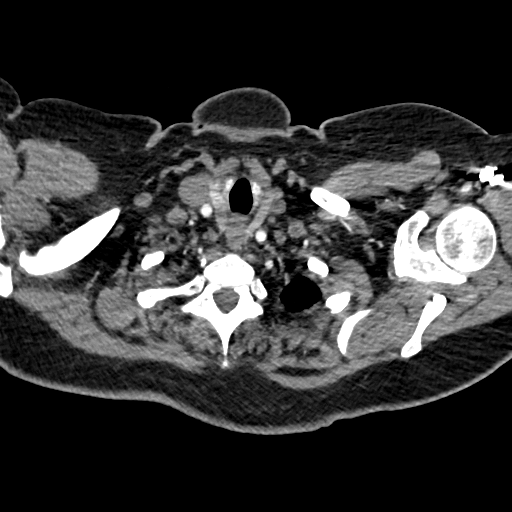
[im 274/289  lung]
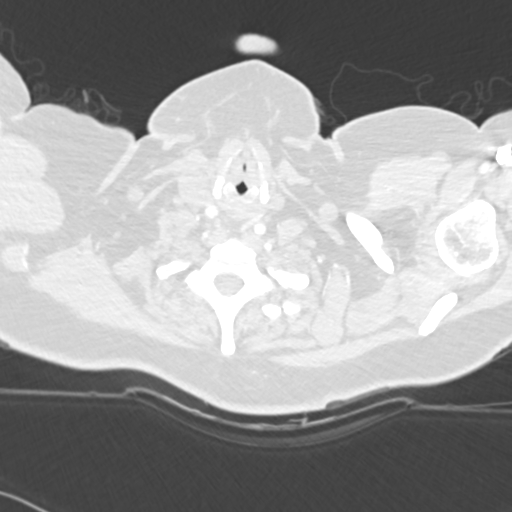

[Series 8: pe 2.0 coronal · coronal · 0.64mm/px · 1 of 121 slices shown]
[im 61/121  mediastinal]
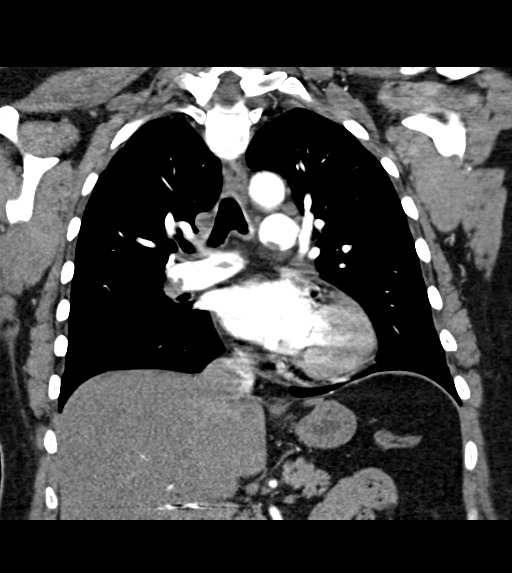

[18 of 36 positions shown; findings below may reference images not displayed]

FINDINGS: A saddle pulmonary embolus is noted, with pulmonary embolus
extending into all lobes of both lungs. Large clot burden is seen.
The RV/LV ratio of 1.64 is consistent with right heart strain and at
least submassive pulmonary embolus.

Mild left basilar airspace opacity may reflect a small pulmonary
infarct. There is minimal nonspecific ground-glass opacity at the
left lung apex, measuring 0.7 cm in size. There is no evidence of
pleural effusion or pneumothorax. No abnormal focal contrast
enhancement is seen.

Visualized mediastinal nodes are normal in size. No mediastinal
lymphadenopathy is seen. No pericardial effusion is identified. The
great vessels are grossly unremarkable in appearance. No axillary
lymphadenopathy is seen. The thyroid gland is unremarkable in
appearance.

The visualized portions of the liver are unremarkable. The patient
is status post splenectomy. The patient is status post
cholecystectomy, with clips noted at the gallbladder fossa. The
visualized portions of the pancreas, adrenal glands and left kidney
are within normal limits.

No acute osseous abnormalities are seen.

Review of the MIP images confirms the above findings.
IMPRESSION: 1. Large saddle pulmonary embolus noted, with pulmonary embolus
extending into all lobes of both lungs. CT evidence of right heart
strain (RV/LV Ratio = 1.64) consistent with at least submassive
(intermediate risk) PE. The presence of right heart strain has been
associated with an increased risk of morbidity and mortality. Please
activate Code PE by paging [phone].
2. Mild left basilar airspace opacity may reflect a small pulmonary
infarct.
3. Minimal nonspecific ground-glass opacity at the left lung apex,
measuring 0.7 cm in size. Would perform initial follow-up CT of the
chest at 3 months, to ensure persistence, as per [HOSPITAL]
recommendations.

Critical Value/emergent results were called by telephone at the time
of interpretation on 03/24/2015 at [DATE] to Dr. JESUS GREGORIO LANUZA, who
verbally acknowledged these results.

## 2016-05-16 MED FILL — XARELTO 15 MG TABLET: 15 | 90 days supply | Qty: 90 | Fill #2

## 2016-06-24 ENCOUNTER — Other Ambulatory Visit: Payer: Self-pay | Admitting: *Deleted

## 2016-06-24 ENCOUNTER — Telehealth: Payer: Self-pay | Admitting: *Deleted

## 2016-06-24 ENCOUNTER — Other Ambulatory Visit (HOSPITAL_BASED_OUTPATIENT_CLINIC_OR_DEPARTMENT_OTHER): Payer: 59

## 2016-06-24 DIAGNOSIS — D693 Immune thrombocytopenic purpura: Secondary | ICD-10-CM

## 2016-06-24 LAB — CBC WITH DIFFERENTIAL/PLATELET
BASO%: 0.1 % (ref 0.0–2.0)
Basophils Absolute: 0 10*3/uL (ref 0.0–0.1)
EOS%: 0 % (ref 0.0–7.0)
Eosinophils Absolute: 0 10*3/uL (ref 0.0–0.5)
HCT: 38.7 % (ref 34.8–46.6)
HEMOGLOBIN: 13.7 g/dL (ref 11.6–15.9)
LYMPH#: 1 10*3/uL (ref 0.9–3.3)
LYMPH%: 8.3 % — AB (ref 14.0–49.7)
MCH: 32 pg (ref 25.1–34.0)
MCHC: 35.4 g/dL (ref 31.5–36.0)
MCV: 90.4 fL (ref 79.5–101.0)
MONO#: 0.1 10*3/uL (ref 0.1–0.9)
MONO%: 1 % (ref 0.0–14.0)
NEUT#: 10.7 10*3/uL — ABNORMAL HIGH (ref 1.5–6.5)
NEUT%: 90.6 % — AB (ref 38.4–76.8)
Platelets: 61 10*3/uL — ABNORMAL LOW (ref 145–400)
RBC: 4.28 10*6/uL (ref 3.70–5.45)
RDW: 13.5 % (ref 11.2–14.5)
WBC: 11.8 10*3/uL — AB (ref 3.9–10.3)
nRBC: 0 % (ref 0–0)

## 2016-06-24 MED ORDER — DEXAMETHASONE 4 MG PO TABS: 4.0000 mg | ORAL_TABLET | Freq: Every day | ORAL | 2 refills | Status: DC

## 2016-06-24 MED FILL — DEXAMETHASONE 4 MG TABLET: 4 | 8 days supply | Qty: 28 | Fill #0

## 2016-06-24 NOTE — Telephone Encounter (Signed)
PAtient platelets 61,000.  Showed to Dr. Marin Olp .  Patient to take Decadron taper per dr. Marin Olp.  Rx sent to [facility].  To see patient on Friday.

## 2016-06-24 NOTE — Telephone Encounter (Signed)
Received call from patient stating that she has petechiae on arms and legs.  Dr. Niel Hummer notified.  To get a CBC today at [facility] per Dr. Marin Olp

## 2016-06-27 ENCOUNTER — Ambulatory Visit (HOSPITAL_BASED_OUTPATIENT_CLINIC_OR_DEPARTMENT_OTHER): Payer: 59 | Admitting: Hematology & Oncology

## 2016-06-27 ENCOUNTER — Encounter: Payer: Self-pay | Admitting: Hematology & Oncology

## 2016-06-27 ENCOUNTER — Other Ambulatory Visit (HOSPITAL_BASED_OUTPATIENT_CLINIC_OR_DEPARTMENT_OTHER): Payer: 59

## 2016-06-27 VITALS — BP 150/74 | HR 66 | Temp 97.4°F | Resp 18 | Ht 64.0 in | Wt 187.0 lb

## 2016-06-27 DIAGNOSIS — I2782 Chronic pulmonary embolism: Secondary | ICD-10-CM

## 2016-06-27 DIAGNOSIS — D693 Immune thrombocytopenic purpura: Secondary | ICD-10-CM

## 2016-06-27 DIAGNOSIS — I2692 Saddle embolus of pulmonary artery without acute cor pulmonale: Secondary | ICD-10-CM

## 2016-06-27 DIAGNOSIS — I2602 Saddle embolus of pulmonary artery with acute cor pulmonale: Secondary | ICD-10-CM

## 2016-06-27 LAB — CBC WITH DIFFERENTIAL (CANCER CENTER ONLY)
BASO#: 0 10*3/uL (ref 0.0–0.2)
BASO%: 0.1 % (ref 0.0–2.0)
EOS%: 0 % (ref 0.0–7.0)
Eosinophils Absolute: 0 10*3/uL (ref 0.0–0.5)
HCT: 36.9 % (ref 34.8–46.6)
HEMOGLOBIN: 13 g/dL (ref 11.6–15.9)
LYMPH#: 3 10*3/uL (ref 0.9–3.3)
LYMPH%: 14.6 % (ref 14.0–48.0)
MCH: 32.5 pg (ref 26.0–34.0)
MCHC: 35.2 g/dL (ref 32.0–36.0)
MCV: 92 fL (ref 81–101)
MONO#: 1.5 10*3/uL — ABNORMAL HIGH (ref 0.1–0.9)
MONO%: 7.4 % (ref 0.0–13.0)
NEUT%: 77.9 % (ref 39.6–80.0)
NEUTROS ABS: 16.1 10*3/uL — AB (ref 1.5–6.5)
Platelets: 262 10*3/uL (ref 145–400)
RBC: 4 10*6/uL (ref 3.70–5.32)
RDW: 13.4 % (ref 11.1–15.7)
WBC: 20.6 10*3/uL — AB (ref 3.9–10.0)

## 2016-06-27 LAB — TECHNOLOGIST REVIEW CHCC SATELLITE

## 2016-06-27 LAB — CHCC SATELLITE - SMEAR

## 2016-06-27 NOTE — Progress Notes (Signed)
Hematology and Oncology Follow Up Visit  Janice Martin [id number] [birth year] 55 y.o. 06/27/2016   Principle Diagnosis:   Chronic immune thrombocytopenia-recurrent  Submassive pulmonary embolism, with right lower extremity thrombus  Current Therapy:    Xarelto 15 mg by mouth daily     Interim History:  [name] is back for followup.  She called earlier this week. They just got back from vacation. She noticed some petechia. We had a CBC done. The CBC showed that her platelet count was at a 61,000.  When this happens, she starts Decadron. She is on a Decadron taper. She was as well with this.   She's had no bleeding. She's had no bruising. She's had no change in bowel or bladder habits.   She and her husband are expected to more grandkids. She is happy about this.   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 She is doing her best to try to overcome this. It is becoming more difficult for her.  There has been no change in medications. She has had no fever. She has had no recent upper respiratory infection.  Overall, her performance status is ECOG 1.   Medications:  Current Outpatient Prescriptions:  .  dexamethasone (DECADRON) 4 MG tablet, Take 1 tablet (4 mg total) by mouth daily. Taper schedule for low platelets.  Take 5 pills a day for 4 days, then 3 pills a day for 2 days then 1 pill a day for 2 days., Disp: 100 tablet, Rfl: 2 .  nebivolol (BYSTOLIC) 5 MG tablet, Take 1 tablet (5 mg total) by mouth daily., Disp: 90 tablet, Rfl: 3 .  rivaroxaban (XARELTO) 15 MG TABS tablet, Take 1 tablet (15 mg total) by mouth daily with supper., Disp: 30 tablet, Rfl: 12 .  valsartan-hydrochlorothiazide (DIOVAN-HCT) 320-25 MG tablet, Take 1 tablet by mouth daily., Disp: 90 tablet, Rfl: 3 .  vitamin B-12 (CYANOCOBALAMIN) 100 MCG tablet, Take 100 mcg by mouth daily., Disp: , Rfl:   Allergies:  Allergies  Allergen Reactions  . Shellfish  Allergy Anaphylaxis  . Influenza Vaccines     Per pt can't take mess up with her immune system  . Morphine Other (See Comments)    Burns really bad    Past Medical History, Surgical history, Social history, and Family History were reviewed and updated.  Review of Systems: As above  Physical Exam:  height is 5\' 4"  (1.626 m) and weight is 187 lb (84.8 kg). Her oral temperature is 97.4 F (36.3 C). Her blood pressure is 150/74 (abnormal) and her pulse is 66. Her respiration is 18.   Well-developed and well-nourished white female. Head and neck exam shows no ocular or oral lesions. There is no adenopathy of the neck. Lungs are clear bilaterally. She has no rales, wheezes or rhonchi. Cardiac exam is regular rate and rhythm with no murmurs rubs or bruits. Abdomen has a well-healed splenectomy scar. She has no fluid wave. There is no palpable liver edge. Back exam no tenderness over the spine ribs or hips. Extremities no clubbing cyanosis or edema. I cannot palpate any venous cord in the right leg. She has a negative Homans sign in the right leg. She has good pulses in her distal extremities. Skin exam shows no rashes ecchymoses or petechia. Neurological exam is nonfocal.  Lab Results  Component Value Date   WBC 20.6 (H) 06/27/2016   HGB 13.0 06/27/2016   HCT 36.9  06/27/2016   MCV 92 06/27/2016   PLT 262 06/27/2016     Chemistry      Component Value Date/Time   NA 135 04/16/2016 1514   NA 140 12/07/2015 1252   K 3.8 04/16/2016 1514   K 3.4 (L) 12/07/2015 1252   CL 97 04/16/2016 1514   CL 97 (L) 08/20/2015 1118   CO2 33 (H) 04/16/2016 1514   CO2 30 (H) 12/07/2015 1252   BUN 17 04/16/2016 1514   BUN 26.7 (H) 12/07/2015 1252   CREATININE 1.11 (H) 04/16/2016 1514   CREATININE 1.1 12/07/2015 1252      Component Value Date/Time   CALCIUM 9.6 04/16/2016 1514   CALCIUM 8.8 12/07/2015 1252   ALKPHOS 96 04/16/2016 1514   ALKPHOS 77 12/07/2015 1252   AST 15 04/16/2016 1514   AST 12  12/07/2015 1252   ALT 16 04/16/2016 1514   ALT 22 12/07/2015 1252   BILITOT 0.3 04/16/2016 1514   BILITOT 0.51 12/07/2015 1252       Impression and Plan: Janice Martin is a 55 year old white female. She has a history of relapsed chronic immune thrombocytopenia. We treated her with Rituxan. She responded very well. She got treated back in October of 2014.  As always, the Decadron has helped. Her blood count is back up.  Is last been about 7 months since she required Decadron. She does her own taper. This seems to work for her very well.  We will go ahead and have her platelet count checked in 2 weeks. She tapers her Decadron slowly.  I will plan to get her back to see me in one month.  [name], MD 9/8/20175:27 PM

## 2016-07-11 ENCOUNTER — Other Ambulatory Visit: Payer: 59

## 2016-07-11 ENCOUNTER — Other Ambulatory Visit (HOSPITAL_BASED_OUTPATIENT_CLINIC_OR_DEPARTMENT_OTHER): Payer: 59

## 2016-07-11 DIAGNOSIS — D693 Immune thrombocytopenic purpura: Secondary | ICD-10-CM | POA: Diagnosis not present

## 2016-07-11 LAB — CBC WITH DIFFERENTIAL/PLATELET
BASO%: 0.6 % (ref 0.0–2.0)
Basophils Absolute: 0.1 10*3/uL (ref 0.0–0.1)
EOS%: 3.6 % (ref 0.0–7.0)
Eosinophils Absolute: 0.5 10*3/uL (ref 0.0–0.5)
HCT: 38.2 % (ref 34.8–46.6)
HGB: 13 g/dL (ref 11.6–15.9)
LYMPH%: 33.7 % (ref 14.0–49.7)
MCH: 31.9 pg (ref 25.1–34.0)
MCHC: 34 g/dL (ref 31.5–36.0)
MCV: 93.6 fL (ref 79.5–101.0)
MONO#: 1.2 10*3/uL — AB (ref 0.1–0.9)
MONO%: 9.8 % (ref 0.0–14.0)
NEUT%: 52.3 % (ref 38.4–76.8)
NEUTROS ABS: 6.4 10*3/uL (ref 1.5–6.5)
Platelets: 224 10*3/uL (ref 145–400)
RBC: 4.08 10*6/uL (ref 3.70–5.45)
RDW: 14.2 % (ref 11.2–14.5)
WBC: 12.3 10*3/uL — AB (ref 3.9–10.3)
lymph#: 4.2 10*3/uL — ABNORMAL HIGH (ref 0.9–3.3)

## 2016-07-11 LAB — TECHNOLOGIST REVIEW

## 2016-07-30 MED FILL — BYSTOLIC 5 MG TABLET: 5 | 90 days supply | Qty: 90 | Fill #3

## 2016-07-31 MED FILL — VALSARTAN-HCTZ 320-25 MG TA: 320-25 | 90 days supply | Qty: 90 | Fill #3

## 2016-08-08 ENCOUNTER — Encounter: Payer: Self-pay | Admitting: Hematology & Oncology

## 2016-08-08 ENCOUNTER — Ambulatory Visit (HOSPITAL_BASED_OUTPATIENT_CLINIC_OR_DEPARTMENT_OTHER): Payer: 59 | Admitting: Hematology & Oncology

## 2016-08-08 ENCOUNTER — Other Ambulatory Visit (HOSPITAL_BASED_OUTPATIENT_CLINIC_OR_DEPARTMENT_OTHER): Payer: 59

## 2016-08-08 VITALS — BP 118/80 | HR 84 | Temp 98.0°F | Resp 16 | Ht 64.0 in | Wt 192.0 lb

## 2016-08-08 DIAGNOSIS — I2699 Other pulmonary embolism without acute cor pulmonale: Secondary | ICD-10-CM | POA: Diagnosis not present

## 2016-08-08 DIAGNOSIS — D693 Immune thrombocytopenic purpura: Secondary | ICD-10-CM

## 2016-08-08 DIAGNOSIS — I82401 Acute embolism and thrombosis of unspecified deep veins of right lower extremity: Secondary | ICD-10-CM

## 2016-08-08 DIAGNOSIS — I82402 Acute embolism and thrombosis of unspecified deep veins of left lower extremity: Secondary | ICD-10-CM

## 2016-08-08 LAB — COMPREHENSIVE METABOLIC PANEL (CC13)
ALT: 18 IU/L (ref 0–32)
AST: 16 IU/L (ref 0–40)
Albumin, Serum: 4.2 g/dL (ref 3.5–5.5)
Albumin/Globulin Ratio: 1.4 (ref 1.2–2.2)
Alkaline Phosphatase, S: 112 IU/L (ref 39–117)
BUN/Creatinine Ratio: 13 (ref 9–23)
BUN: 17 mg/dL (ref 6–24)
Bilirubin Total: 0.2 mg/dL (ref 0.0–1.2)
CALCIUM: 9.7 mg/dL (ref 8.7–10.2)
CO2: 30 mmol/L — AB (ref 18–29)
CREATININE: 1.27 mg/dL — AB (ref 0.57–1.00)
Chloride, Ser: 99 mmol/L (ref 96–106)
GFR, EST AFRICAN AMERICAN: 56 mL/min/{1.73_m2} — AB (ref >59)
GFR, EST NON AFRICAN AMERICAN: 48 mL/min/{1.73_m2} — AB (ref >59)
GLUCOSE: 105 mg/dL — AB (ref 65–99)
Globulin, Total: 3.1 g/dL (ref 1.5–4.5)
POTASSIUM: 3.7 mmol/L (ref 3.5–5.2)
Sodium: 136 mmol/L (ref 134–144)
TOTAL PROTEIN: 7.3 g/dL (ref 6.0–8.5)

## 2016-08-08 LAB — CBC WITH DIFFERENTIAL (CANCER CENTER ONLY)
BASO#: 0.1 10*3/uL (ref 0.0–0.2)
BASO%: 0.7 % (ref 0.0–2.0)
EOS%: 9.6 % — AB (ref 0.0–7.0)
Eosinophils Absolute: 1.2 10*3/uL — ABNORMAL HIGH (ref 0.0–0.5)
HCT: 37.4 % (ref 34.8–46.6)
HGB: 12.9 g/dL (ref 11.6–15.9)
LYMPH#: 4.8 10*3/uL — ABNORMAL HIGH (ref 0.9–3.3)
LYMPH%: 39.3 % (ref 14.0–48.0)
MCH: 32.5 pg (ref 26.0–34.0)
MCHC: 34.5 g/dL (ref 32.0–36.0)
MCV: 94 fL (ref 81–101)
MONO#: 1 10*3/uL — AB (ref 0.1–0.9)
MONO%: 7.9 % (ref 0.0–13.0)
NEUT#: 5.2 10*3/uL (ref 1.5–6.5)
NEUT%: 42.5 % (ref 39.6–80.0)
PLATELETS: 263 10*3/uL (ref 145–400)
RBC: 3.97 10*6/uL (ref 3.70–5.32)
RDW: 13.6 % (ref 11.1–15.7)
WBC: 12.3 10*3/uL — AB (ref 3.9–10.0)

## 2016-08-08 NOTE — Progress Notes (Signed)
Hematology and Oncology Follow Up Visit  ANNE-MARIE BATTANI [id number] August 20, 1961 55 y.o. 08/08/2016   Principle Diagnosis:   Chronic immune thrombocytopenia-recurrent  Submassive pulmonary embolism, with right lower extremity thrombus  Current Therapy:    Xarelto 15 mg by mouth daily     Interim History:  Ms.  Frasca is back for followup.  She looks great. She feels good area and she and her husband are going to [facility] for a little weekend vacation. I'm sure that they will enjoy this.  Whenever she has a exacerbation of her immune thrombocytopenia, Decadron works very well for her.  She is on Xarelto. There's been no bleeding. She's had no bruising.  She is still working full time. 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  She now has a third grandchild. She is 67 weeks old. Another grandchild will be coming soon.  She's not had any fever. Her appetite has been good. She's had no cough. She had no chest wall pain. There's been no change in bowel or bladder habits.  Overall, her performance status is ECOG 1.   Medications:  Current Outpatient Prescriptions:  .  dexamethasone (DECADRON) 4 MG tablet, Take 1 tablet (4 mg total) by mouth daily. Taper schedule for low platelets.  Take 5 pills a day for 4 days, then 3 pills a day for 2 days then 1 pill a day for 2 days., Disp: 100 tablet, Rfl: 2 .  nebivolol (BYSTOLIC) 5 MG tablet, Take 1 tablet (5 mg total) by mouth daily., Disp: 90 tablet, Rfl: 3 .  rivaroxaban (XARELTO) 15 MG TABS tablet, Take 1 tablet (15 mg total) by mouth daily with supper., Disp: 30 tablet, Rfl: 12 .  valsartan-hydrochlorothiazide (DIOVAN-HCT) 320-25 MG tablet, Take 1 tablet by mouth daily., Disp: 90 tablet, Rfl: 3 .  vitamin B-12 (CYANOCOBALAMIN) 100 MCG tablet, Take 100 mcg by mouth daily., Disp: , Rfl:   Allergies:  Allergies  Allergen Reactions  . Shellfish Allergy Anaphylaxis  . Influenza Vaccines     Per pt can't take mess up with  her immune system  . Morphine Other (See Comments)    Burns really bad    Past Medical History, Surgical history, Social history, and Family History were reviewed and updated.  Review of Systems: As above  Physical Exam:  height is 5\' 4"  (1.626 m) and weight is 192 lb (87.1 kg). Her oral temperature is 98 F (36.7 C). Her blood pressure is 118/80 and her pulse is 84. Her respiration is 16.   Well-developed and well-nourished white female. Head and neck exam shows no ocular or oral lesions. There is no adenopathy of the neck. Lungs are clear bilaterally. She has no rales, wheezes or rhonchi. Cardiac exam is regular rate and rhythm with no murmurs rubs or bruits. Abdomen has a well-healed splenectomy scar. She has no fluid wave. There is no palpable liver edge. Back exam no tenderness over the spine ribs or hips. Extremities no clubbing cyanosis or edema. I cannot palpate any venous cord in the right leg. She has a negative Homans sign in the right leg. She has good pulses in her distal extremities. Skin exam shows no rashes ecchymoses or petechia. Neurological exam is nonfocal.  Lab Results  Component Value Date   WBC 12.3 (H) 08/08/2016   HGB 12.9 08/08/2016   HCT 37.4 08/08/2016   MCV 94 08/08/2016   PLT 263 08/08/2016     Chemistry  Component Value Date/Time   NA 135 04/16/2016 1514   NA 140 12/07/2015 1252   K 3.8 04/16/2016 1514   K 3.4 (L) 12/07/2015 1252   CL 97 04/16/2016 1514   CL 97 (L) 08/20/2015 1118   CO2 33 (H) 04/16/2016 1514   CO2 30 (H) 12/07/2015 1252   BUN 17 04/16/2016 1514   BUN 26.7 (H) 12/07/2015 1252   CREATININE 1.11 (H) 04/16/2016 1514   CREATININE 1.1 12/07/2015 1252      Component Value Date/Time   CALCIUM 9.6 04/16/2016 1514   CALCIUM 8.8 12/07/2015 1252   ALKPHOS 96 04/16/2016 1514   ALKPHOS 77 12/07/2015 1252   AST 15 04/16/2016 1514   AST 12 12/07/2015 1252   ALT 16 04/16/2016 1514   ALT 22 12/07/2015 1252   BILITOT 0.3 04/16/2016  1514   BILITOT 0.51 12/07/2015 1252       Impression and Plan: Ms. Burdett is a 55 year old white female. She has a history of relapsed chronic immune thrombocytopenia. We treated her with Rituxan. She responded very well. She got treated back in October of 2014.  As always, the Decadron has helped. Her blood count is back up.  It has been about 9 months since she required Decadron. She does her own taper. This seems to work for her very well.  I think we get her back now in 6 weeks. I don't think we need any lab work in between visits.   [name], MD 10/20/20173:44 PM

## 2016-08-20 MED FILL — XARELTO 10 MG TABLET: 10 | 90 days supply | Qty: 90 | Fill #0

## 2016-10-01 ENCOUNTER — Ambulatory Visit (HOSPITAL_COMMUNITY)
Admission: RE | Admit: 2016-10-01 | Discharge: 2016-10-01 | Disposition: A | Discharge status: Home / Self Care | Payer: 59 | Source: Ambulatory Visit | Attending: Family | Admitting: Family

## 2016-10-01 ENCOUNTER — Telehealth: Payer: Self-pay | Admitting: *Deleted

## 2016-10-01 ENCOUNTER — Other Ambulatory Visit: Payer: Self-pay | Admitting: Family

## 2016-10-01 DIAGNOSIS — R059 Cough, unspecified: Secondary | ICD-10-CM

## 2016-10-01 DIAGNOSIS — I2692 Saddle embolus of pulmonary artery without acute cor pulmonale: Secondary | ICD-10-CM

## 2016-10-01 DIAGNOSIS — I2782 Chronic pulmonary embolism: Secondary | ICD-10-CM

## 2016-10-01 DIAGNOSIS — Z86711 Personal history of pulmonary embolism: Secondary | ICD-10-CM | POA: Diagnosis not present

## 2016-10-01 DIAGNOSIS — R05 Cough: Secondary | ICD-10-CM

## 2016-10-01 NOTE — Telephone Encounter (Signed)
Patient c/o non-productive cough. She denies fever but has some discomfort from her continued cough. She would like a chest xray because last time this occurred, she ended up hospitalized. Spoke to Dr Marin Olp. A CXR will be ordered to be completed at [facility]. Patient aware.

## 2016-10-03 ENCOUNTER — Ambulatory Visit (HOSPITAL_BASED_OUTPATIENT_CLINIC_OR_DEPARTMENT_OTHER): Payer: 59 | Admitting: Hematology & Oncology

## 2016-10-03 ENCOUNTER — Other Ambulatory Visit (HOSPITAL_BASED_OUTPATIENT_CLINIC_OR_DEPARTMENT_OTHER): Payer: 59

## 2016-10-03 VITALS — BP 138/79 | HR 88 | Temp 98.1°F | Resp 16

## 2016-10-03 DIAGNOSIS — I825Z9 Chronic embolism and thrombosis of unspecified deep veins of unspecified distal lower extremity: Secondary | ICD-10-CM

## 2016-10-03 DIAGNOSIS — I82401 Acute embolism and thrombosis of unspecified deep veins of right lower extremity: Secondary | ICD-10-CM | POA: Diagnosis not present

## 2016-10-03 DIAGNOSIS — D693 Immune thrombocytopenic purpura: Secondary | ICD-10-CM

## 2016-10-03 DIAGNOSIS — I2699 Other pulmonary embolism without acute cor pulmonale: Secondary | ICD-10-CM

## 2016-10-03 DIAGNOSIS — I82402 Acute embolism and thrombosis of unspecified deep veins of left lower extremity: Secondary | ICD-10-CM

## 2016-10-03 LAB — CBC WITH DIFFERENTIAL (CANCER CENTER ONLY)
BASO#: 0.1 10*3/uL (ref 0.0–0.2)
BASO%: 0.5 % (ref 0.0–2.0)
EOS%: 7.1 % — AB (ref 0.0–7.0)
Eosinophils Absolute: 0.9 10*3/uL — ABNORMAL HIGH (ref 0.0–0.5)
HCT: 34.6 % — ABNORMAL LOW (ref 34.8–46.6)
HEMOGLOBIN: 12.1 g/dL (ref 11.6–15.9)
LYMPH#: 4.1 10*3/uL — ABNORMAL HIGH (ref 0.9–3.3)
LYMPH%: 34.2 % (ref 14.0–48.0)
MCH: 32.2 pg (ref 26.0–34.0)
MCHC: 35 g/dL (ref 32.0–36.0)
MCV: 92 fL (ref 81–101)
MONO#: 1 10*3/uL — ABNORMAL HIGH (ref 0.1–0.9)
MONO%: 8.4 % (ref 0.0–13.0)
NEUT%: 49.8 % (ref 39.6–80.0)
NEUTROS ABS: 6 10*3/uL (ref 1.5–6.5)
RBC: 3.76 10*6/uL (ref 3.70–5.32)
RDW: 12.6 % (ref 11.1–15.7)
WBC: 12 10*3/uL — AB (ref 3.9–10.0)

## 2016-10-03 LAB — CHCC SATELLITE - SMEAR

## 2016-10-03 LAB — TECHNOLOGIST REVIEW CHCC SATELLITE

## 2016-10-03 NOTE — Progress Notes (Signed)
Hematology and Oncology Follow Up Visit  Janice Martin [id number] 04-11-1961 55 y.o. 10/03/2016   Principle Diagnosis:   Chronic immune thrombocytopenia-recurrent  Submassive pulmonary embolism, with right lower extremity thrombus  Current Therapy:    Xarelto 15 mg by mouth daily     Interim History:  [name] is back for followup.  She had a we'll bit of a tough time this week. She had a cough. This was nonproductive. We did get a chest x-ray. This was done on the 13th. Everything looked fine. There is no obvious cardiopulmonary disease.  She has had no fever. She's had no bleeding. She's had no nausea or vomiting. She's had no bruising.  She's had no weight loss or weight gain. She is still working. She works for radiation oncology.  There's been no leg swelling.  She's had no problems with Xarelto.  She had a very nice things giving. She is looking forward to Christmas.  Another grandchild will be born in April.   Overall, her performance status is ECOG 1.   Medications:  Current Outpatient Prescriptions:  .  nebivolol (BYSTOLIC) 5 MG tablet, Take 1 tablet (5 mg total) by mouth daily., Disp: 90 tablet, Rfl: 3 .  rivaroxaban (XARELTO) 15 MG TABS tablet, Take 1 tablet (15 mg total) by mouth daily with supper., Disp: 30 tablet, Rfl: 12 .  valsartan-hydrochlorothiazide (DIOVAN-HCT) 320-25 MG tablet, Take 1 tablet by mouth daily., Disp: 90 tablet, Rfl: 3 .  vitamin B-12 (CYANOCOBALAMIN) 100 MCG tablet, Take 100 mcg by mouth daily., Disp: , Rfl:  .  dexamethasone (DECADRON) 4 MG tablet, Take 1 tablet (4 mg total) by mouth daily. Taper schedule for low platelets.  Take 5 pills a day for 4 days, then 3 pills a day for 2 days then 1 pill a day for 2 days. (Patient not taking: Reported on 10/03/2016), Disp: 100 tablet, Rfl: 2  Allergies:  Allergies  Allergen Reactions  . Shellfish Allergy Anaphylaxis  . Influenza Vaccines     Per pt can't take mess up with her immune system   . Morphine Other (See Comments)    Burns really bad    Past Medical History, Surgical history, Social history, and Family History were reviewed and updated.  Review of Systems: As above  Physical Exam:  oral temperature is 98.1 F (36.7 C). Her blood pressure is 138/79 and her pulse is 88. Her respiration is 16 and oxygen saturation is 99%.   Well-developed and well-nourished white female. Head and neck exam shows no ocular or oral lesions. There is no adenopathy of the neck. Lungs are clear bilaterally. She has no rales, wheezes or rhonchi. Cardiac exam is regular rate and rhythm with no murmurs rubs or bruits. Abdomen has a well-healed splenectomy scar. She has no fluid wave. There is no palpable liver edge. Back exam no tenderness over the spine ribs or hips. Extremities no clubbing cyanosis or edema. I cannot palpate any venous cord in the right leg. She has a negative Homans sign in the right leg. She has good pulses in her distal extremities. Skin exam shows no rashes ecchymoses or petechia. Neurological exam is nonfocal.  Lab Results  Component Value Date   WBC 12.0 (H) 10/03/2016   HGB 12.1 10/03/2016   HCT 34.6 (L) 10/03/2016   MCV 92 10/03/2016   PLT 418 Platelet count confirmed by slide estimate (H) 10/03/2016     Chemistry      Component Value Date/Time  NA 136 08/08/2016 1439   NA 140 12/07/2015 1252   K 3.7 08/08/2016 1439   K 3.4 (L) 12/07/2015 1252   CL 99 08/08/2016 1439   CL 97 (L) 08/20/2015 1118   CO2 30 (H) 08/08/2016 1439   CO2 30 (H) 12/07/2015 1252   BUN 17 08/08/2016 1439   BUN 26.7 (H) 12/07/2015 1252   CREATININE 1.27 (H) 08/08/2016 1439   CREATININE 1.1 12/07/2015 1252      Component Value Date/Time   CALCIUM 9.7 08/08/2016 1439   CALCIUM 8.8 12/07/2015 1252   ALKPHOS 112 08/08/2016 1439   ALKPHOS 77 12/07/2015 1252   AST 16 08/08/2016 1439   AST 12 12/07/2015 1252   ALT 18 08/08/2016 1439   ALT 22 12/07/2015 1252   BILITOT 0.2 08/08/2016  1439   BILITOT 0.51 12/07/2015 1252       Impression and Plan: Janice Martin is a 55 year old white female. She has a history of relapsed chronic immune thrombocytopenia. We treated her with Rituxan. She responded very well. She got treated back in October of 2014.  Everything really looks good right now. Her platlet count is fantastic. I looked at her blood the microscope. I do not see anything that looked suspicious.   We will plan to get her back in 3 months. I think this would be very reasonable. She will let us know if she needs any blood work.   [name], MD 12/15/20174:19 PM

## 2016-10-24 ENCOUNTER — Encounter: Payer: Self-pay | Admitting: *Deleted

## 2016-11-11 ENCOUNTER — Other Ambulatory Visit: Payer: Self-pay | Admitting: *Deleted

## 2016-11-11 ENCOUNTER — Other Ambulatory Visit: Payer: Self-pay | Admitting: Internal Medicine

## 2016-11-11 MED ORDER — VALSARTAN-HYDROCHLOROTHIAZIDE 320-25 MG PO TABS: 1.0000 | ORAL_TABLET | Freq: Every day | ORAL | 0 refills | Status: DC

## 2016-11-11 MED ORDER — NEBIVOLOL HCL 5 MG PO TABS: 5.0000 mg | ORAL_TABLET | Freq: Every day | ORAL | 0 refills | Status: DC

## 2016-11-11 MED FILL — BYSTOLIC 5 MG TABLET: 5 | 90 days supply | Qty: 90 | Fill #0

## 2016-11-11 MED FILL — VALSARTAN-HCTZ 320-25 MG TA: 320-25 | 90 days supply | Qty: 90 | Fill #0

## 2016-11-12 MED ORDER — VALSARTAN-HYDROCHLOROTHIAZIDE 320-25 MG PO TABS: 1.0000 | ORAL_TABLET | Freq: Every day | ORAL | 0 refills | Status: DC

## 2016-11-12 MED ORDER — NEBIVOLOL HCL 5 MG PO TABS: 5.0000 mg | ORAL_TABLET | Freq: Every day | ORAL | 0 refills | Status: DC

## 2016-12-04 MED FILL — XARELTO 10 MG TABLET: 10 | 90 days supply | Qty: 90 | Fill #1

## 2016-12-15 ENCOUNTER — Ambulatory Visit: Payer: 59 | Admitting: Internal Medicine

## 2016-12-26 ENCOUNTER — Encounter: Payer: 59 | Admitting: Internal Medicine

## 2017-01-02 ENCOUNTER — Other Ambulatory Visit: Payer: 59

## 2017-01-02 ENCOUNTER — Ambulatory Visit: Payer: 59 | Admitting: Family

## 2017-01-06 ENCOUNTER — Ambulatory Visit: Payer: 59 | Admitting: Internal Medicine

## 2017-01-16 ENCOUNTER — Other Ambulatory Visit (HOSPITAL_BASED_OUTPATIENT_CLINIC_OR_DEPARTMENT_OTHER): Payer: 59

## 2017-01-16 ENCOUNTER — Ambulatory Visit (HOSPITAL_BASED_OUTPATIENT_CLINIC_OR_DEPARTMENT_OTHER): Payer: 59 | Admitting: Hematology & Oncology

## 2017-01-16 VITALS — BP 140/72 | HR 81 | Temp 98.2°F | Resp 18 | Wt 188.0 lb

## 2017-01-16 DIAGNOSIS — I2699 Other pulmonary embolism without acute cor pulmonale: Secondary | ICD-10-CM

## 2017-01-16 DIAGNOSIS — I825Z9 Chronic embolism and thrombosis of unspecified deep veins of unspecified distal lower extremity: Secondary | ICD-10-CM

## 2017-01-16 DIAGNOSIS — I82401 Acute embolism and thrombosis of unspecified deep veins of right lower extremity: Secondary | ICD-10-CM

## 2017-01-16 DIAGNOSIS — D693 Immune thrombocytopenic purpura: Secondary | ICD-10-CM | POA: Diagnosis not present

## 2017-01-16 DIAGNOSIS — I2602 Saddle embolus of pulmonary artery with acute cor pulmonale: Secondary | ICD-10-CM

## 2017-01-16 DIAGNOSIS — I2782 Chronic pulmonary embolism: Secondary | ICD-10-CM

## 2017-01-16 DIAGNOSIS — I825Z2 Chronic embolism and thrombosis of unspecified deep veins of left distal lower extremity: Secondary | ICD-10-CM

## 2017-01-16 DIAGNOSIS — I2692 Saddle embolus of pulmonary artery without acute cor pulmonale: Secondary | ICD-10-CM

## 2017-01-16 LAB — CBC WITH DIFFERENTIAL (CANCER CENTER ONLY)
BASO#: 0.1 10*3/uL (ref 0.0–0.2)
BASO%: 0.7 % (ref 0.0–2.0)
EOS%: 5.9 % (ref 0.0–7.0)
Eosinophils Absolute: 0.5 10*3/uL (ref 0.0–0.5)
HEMATOCRIT: 39 % (ref 34.8–46.6)
HEMOGLOBIN: 13.6 g/dL (ref 11.6–15.9)
LYMPH#: 3.2 10*3/uL (ref 0.9–3.3)
LYMPH%: 35.1 % (ref 14.0–48.0)
MCH: 31.8 pg (ref 26.0–34.0)
MCHC: 34.9 g/dL (ref 32.0–36.0)
MCV: 91 fL (ref 81–101)
MONO#: 0.7 10*3/uL (ref 0.1–0.9)
MONO%: 7.4 % (ref 0.0–13.0)
NEUT%: 50.9 % (ref 39.6–80.0)
NEUTROS ABS: 4.6 10*3/uL (ref 1.5–6.5)
Platelets: 121 10*3/uL — ABNORMAL LOW (ref 145–400)
RBC: 4.28 10*6/uL (ref 3.70–5.32)
RDW: 13.5 % (ref 11.1–15.7)
WBC: 9 10*3/uL (ref 3.9–10.0)

## 2017-01-16 LAB — CHCC SATELLITE - SMEAR

## 2017-01-16 MED ORDER — RIVAROXABAN 10 MG PO TABS: 10.0000 mg | ORAL_TABLET | Freq: Every day | ORAL | 12 refills | Status: DC

## 2017-01-16 NOTE — Progress Notes (Signed)
Hematology and Oncology Follow Up Visit  Janice Martin [id number] Sep 25, 1961 56 y.o. 01/16/2017   Principle Diagnosis:   Chronic immune thrombocytopenia-recurrent  Submassive pulmonary embolism, with right lower extremity thrombus  Current Therapy:    Xarelto 10 mg by mouth daily     Interim History:  [name] is back for followup. She had another grandson born. He was born on March 23. Thankfully, everybody was healthy.  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 This has been quite stressful. A lot is going on right now.  She has had no problems with Xarelto. We have her on low-dose maintenance Xarelto. She is doing well with this.  She's had no bleeding. There's been no change in bowel or bladder habits. She's had no fever. She currently has a little bit of a sinus infection.  Overall, her performance status is ECOG 1.   Medications:  Current Outpatient Prescriptions:  .  dexamethasone (DECADRON) 4 MG tablet, Take 1 tablet (4 mg total) by mouth daily. Taper schedule for low platelets.  Take 5 pills a day for 4 days, then 3 pills a day for 2 days then 1 pill a day for 2 days. (Patient not taking: Reported on 10/03/2016), Disp: 100 tablet, Rfl: 2 .  nebivolol (BYSTOLIC) 5 MG tablet, Take 1 tablet (5 mg total) by mouth daily. Physical due in March must see MD for refills, Disp: 90 tablet, Rfl: 0 .  rivaroxaban (XARELTO) 15 MG TABS tablet, Take 1 tablet (15 mg total) by mouth daily with supper., Disp: 30 tablet, Rfl: 12 .  valsartan-hydrochlorothiazide (DIOVAN-HCT) 320-25 MG tablet, Take 1 tablet by mouth daily. Yearly physical w/labs due in March must see MD for refills, Disp: 90 tablet, Rfl: 0 .  vitamin B-12 (CYANOCOBALAMIN) 100 MCG tablet, Take 100 mcg by mouth daily., Disp: , Rfl:   Allergies:  Allergies  Allergen Reactions  . Shellfish Allergy Anaphylaxis  . Influenza Vaccines     Per pt can't take mess up with her immune system  . Morphine  Other (See Comments)    Burns really bad    Past Medical History, Surgical history, Social history, and Family History were reviewed and updated.  Review of Systems: As above  Physical Exam:  weight is 188 lb (85.3 kg). Her oral temperature is 98.2 F (36.8 C). Her blood pressure is 140/72 and her pulse is 81. Her respiration is 18 and oxygen saturation is 99%.   Well-developed and well-nourished white female. Head and neck exam shows no ocular or oral lesions. There is no adenopathy of the neck. Lungs are clear bilaterally. She has no rales, wheezes or rhonchi. Cardiac exam is regular rate and rhythm with no murmurs rubs or bruits. Abdomen has a well-healed splenectomy scar. She has no fluid wave. There is no palpable liver edge. Back exam no tenderness over the spine ribs or hips. Extremities no clubbing cyanosis or edema. I cannot palpate any venous cord in the right leg. She has a negative Homans sign in the right leg. She has good pulses in her distal extremities. Skin exam shows no rashes ecchymoses or petechia. Neurological exam is nonfocal.  Lab Results  Component Value Date   WBC 9.0 01/16/2017   HGB 13.6 01/16/2017   HCT 39.0 01/16/2017   MCV 91 01/16/2017   PLT 121 Platelet count consistent in citrate (L) 01/16/2017     Chemistry      Component Value Date/Time   NA  136 08/08/2016 1439   NA 140 12/07/2015 1252   K 3.7 08/08/2016 1439   K 3.4 (L) 12/07/2015 1252   CL 99 08/08/2016 1439   CL 97 (L) 08/20/2015 1118   CO2 30 (H) 08/08/2016 1439   CO2 30 (H) 12/07/2015 1252   BUN 17 08/08/2016 1439   BUN 26.7 (H) 12/07/2015 1252   CREATININE 1.27 (H) 08/08/2016 1439   CREATININE 1.1 12/07/2015 1252      Component Value Date/Time   CALCIUM 9.7 08/08/2016 1439   CALCIUM 8.8 12/07/2015 1252   ALKPHOS 112 08/08/2016 1439   ALKPHOS 77 12/07/2015 1252   AST 16 08/08/2016 1439   AST 12 12/07/2015 1252   ALT 18 08/08/2016 1439   ALT 22 12/07/2015 1252   BILITOT 0.2  08/08/2016 1439   BILITOT 0.51 12/07/2015 1252       Impression and Plan: Janice Martin is a 56 year old white female. She has a history of relapsed chronic immune thrombocytopenia. We treated her with Rituxan. She responded very well. She got treated back in October of 2014.  Her platelet count is starting to trend back down. Typically, this is an indicator that she is trying to "relapse".  She always takes Decadron. She has a special protocol that she uses. I think she takes Decadron for about 5-7 days.  I think we should get her labs checked in about 3 or 4 weeks. If her platelet count is below 70-80,000, then I would recommend that she start Decadron.  I don't see any problems with her being on Xarelto for her pulmonary embolism. She will be on Xarelto long-term.   I will plan to see her back in another 3 months.   [name], MD 3/30/20185:05 PM

## 2017-02-13 ENCOUNTER — Other Ambulatory Visit: Payer: Self-pay | Admitting: *Deleted

## 2017-02-13 ENCOUNTER — Other Ambulatory Visit (HOSPITAL_BASED_OUTPATIENT_CLINIC_OR_DEPARTMENT_OTHER): Payer: 59

## 2017-02-13 DIAGNOSIS — I825Z2 Chronic embolism and thrombosis of unspecified deep veins of left distal lower extremity: Secondary | ICD-10-CM | POA: Diagnosis not present

## 2017-02-13 DIAGNOSIS — I2602 Saddle embolus of pulmonary artery with acute cor pulmonale: Secondary | ICD-10-CM

## 2017-02-13 DIAGNOSIS — D693 Immune thrombocytopenic purpura: Secondary | ICD-10-CM

## 2017-02-13 DIAGNOSIS — I2692 Saddle embolus of pulmonary artery without acute cor pulmonale: Secondary | ICD-10-CM | POA: Diagnosis not present

## 2017-02-13 DIAGNOSIS — I2782 Chronic pulmonary embolism: Secondary | ICD-10-CM

## 2017-02-13 LAB — CBC WITH DIFFERENTIAL/PLATELET
BASO%: 0.8 % (ref 0.0–2.0)
Basophils Absolute: 0.1 10*3/uL (ref 0.0–0.1)
EOS ABS: 0.6 10*3/uL — AB (ref 0.0–0.5)
EOS%: 7 % (ref 0.0–7.0)
HEMATOCRIT: 39.7 % (ref 34.8–46.6)
HEMOGLOBIN: 13.5 g/dL (ref 11.6–15.9)
LYMPH%: 40.2 % (ref 14.0–49.7)
MCH: 31 pg (ref 25.1–34.0)
MCHC: 34 g/dL (ref 31.5–36.0)
MCV: 91.1 fL (ref 79.5–101.0)
MONO#: 0.7 10*3/uL (ref 0.1–0.9)
MONO%: 8.6 % (ref 0.0–14.0)
NEUT#: 3.4 10*3/uL (ref 1.5–6.5)
NEUT%: 43.4 % (ref 38.4–76.8)
PLATELETS: 45 10*3/uL — AB (ref 145–400)
RBC: 4.36 10*6/uL (ref 3.70–5.45)
RDW: 13.9 % (ref 11.2–14.5)
WBC: 7.8 10*3/uL (ref 3.9–10.3)
lymph#: 3.1 10*3/uL (ref 0.9–3.3)
nRBC: 0 % (ref 0–0)

## 2017-02-13 LAB — COMPREHENSIVE METABOLIC PANEL
ALBUMIN: 3.9 g/dL (ref 3.5–5.0)
ALK PHOS: 106 U/L (ref 40–150)
ALT: 18 U/L (ref 0–55)
AST: 17 U/L (ref 5–34)
Anion Gap: 9 mEq/L (ref 3–11)
BUN: 10.5 mg/dL (ref 7.0–26.0)
CALCIUM: 9.5 mg/dL (ref 8.4–10.4)
CO2: 29 mEq/L (ref 22–29)
Chloride: 100 mEq/L (ref 98–109)
Creatinine: 1 mg/dL (ref 0.6–1.1)
EGFR: 64 mL/min/{1.73_m2} — AB (ref >90)
Glucose: 89 mg/dl (ref 70–140)
POTASSIUM: 3.7 meq/L (ref 3.5–5.1)
Sodium: 138 mEq/L (ref 136–145)
Total Bilirubin: 0.31 mg/dL (ref 0.20–1.20)
Total Protein: 7.7 g/dL (ref 6.4–8.3)

## 2017-02-13 MED ORDER — DEXAMETHASONE 4 MG PO TABS: 4.0000 mg | ORAL_TABLET | Freq: Every day | ORAL | 2 refills | Status: DC

## 2017-02-13 MED FILL — DEXAMETHASONE 4 MG TABLET: 4 | 8 days supply | Qty: 28 | Fill #1

## 2017-02-14 LAB — D-DIMER, QUANTITATIVE: D-DIMER: <0.2 mg/L FEU (ref 0.00–0.49)

## 2017-02-18 ENCOUNTER — Ambulatory Visit (INDEPENDENT_AMBULATORY_CARE_PROVIDER_SITE_OTHER): Payer: 59 | Admitting: Internal Medicine

## 2017-02-18 ENCOUNTER — Encounter: Payer: Self-pay | Admitting: Internal Medicine

## 2017-02-18 DIAGNOSIS — I2692 Saddle embolus of pulmonary artery without acute cor pulmonale: Secondary | ICD-10-CM | POA: Diagnosis not present

## 2017-02-18 NOTE — Assessment & Plan Note (Signed)
2 years almost since first and only PE D-dimer normal June 2017 and April 2018 Risk for recurrence is low given normal d-dimer and fact you are on low dose xarelto Glad no shortness of breath  Plan We discussed and agreed that you will take d-dimer low dose at 10mg  preventative indefinitely We agreed that aspirin is not a good fit for you given ITP  followup Dr Marin Olp for this As needed with pumonary

## 2017-02-18 NOTE — Patient Instructions (Signed)
Saddle embolus of pulmonary artery ([place]) 2 years almost since first and only PE D-dimer normal June 2017 and April 2018 Risk for recurrence is low given normal d-dimer and fact you are on low dose xarelto Glad no shortness of breath  Plan We discussed and agreed that you will take d-dimer low dose at 10mg  preventative indefinitely We agreed that aspirin is not a good fit for you given ITP  followup Dr Marin Olp for this As needed with pumonary

## 2017-02-18 NOTE — Progress Notes (Signed)
Subjective:     Patient ID: Janice Martin, female   DOB: [birth year], 56 y.o.   MRN: [id number]  HPI    OV 12/10/2015  Chief Complaint  Patient presents with  . Follow-up    Pt states that she is doing well. Pt denies cough/wheeze/CP/tightness.    Follow-up submassive pulmonary embolism June 2016.  Currently doing well. Seen by Dr. Johnette Abraham in January 2017 and his overall dose reduced to 15 mg per day. His notes indicate that she will need lifelong anticoagulation especially given her ITP. She is on board with this program. She says the dose was reduced 15 mg per day. Review of the notes indicate the dose was reduced. I presume this might be because of bleeding risk. Most recently she had a ITP flare and was given Decadron and then a week ago her labs were checked and her platelet count is 320,005 partially reviewed is resolved. Currently she is doing well. No respirator complaints no bleeding. She is lost significant amount of weight due to dietary restriction and exercise all intentionally through the [facility].   OV 02/18/2017  Chief Complaint  Patient presents with  . Follow-up    Pt states her breathing is doing well. Pt denies any complaints at this time.     Follow-up submassive pulmonary embolism in June 2016  Visit one year follow-up. She is almost completed 2 years since of pulmonary embolism. Currently her ITP is in flare up and she is on Decadron. She's now on 10 mg or although as prescribed by Dr. Johnette Abraham in hematology. She has no dyspnea cough or chest pain or wheezing. She is doing really well. Her most recent d-dimer was normal. D-dimer 1 year ago also was normal. Nevertheless she is not interested in stopping this or although at 10 mg. She views this as preventative at this point. She's not interested in aspirin prophylaxis because of her ITP she is also not interested in stopping the Xarelto given submassive. He understands that his risk of bleeding with Xarelto although  she also understands that she is at a lower dose and that is completing his lower. This no chest pain.     Results for CHARIDY, CAPPELLETTI (MRN [id number]) as of 02/18/2017 09:53  Ref. Range 04/16/2016 15:14 02/13/2017 11:52  D-dimer Latest Ref Range: 0.00 - 0.49 mg/L FEU <0.20 <0.20      has a past medical history of ANEMIA-IRON DEFICIENCY; Anxiety state, unspecified; DVT of leg (deep venous thrombosis) ([place]) (04/04/2015); HYPERTENSION; Immune thrombocytopenic purpura ([place]) (2003 dx); LEUKOCYTOSIS UNSPECIFIED; and Pulmonary embolism [facility]) (June 2016).   reports that she has never smoked. She has never used smokeless tobacco.  Past Surgical History:  Procedure Laterality Date  . CESAREAN SECTION     x's 2  . CHOLECYSTECTOMY    . Spleenectomy      Allergies  Allergen Reactions  . Shellfish Allergy Anaphylaxis  . Influenza Vaccines     Per pt can't take mess up with her immune system  . Morphine Other (See Comments)    Burns really bad    Immunization History  Administered Date(s) Administered  . Influenza Whole 07/20/2009  . Td 10/20/2006    Family History  Problem Relation Age of Onset  . Arthritis Father   . Mental illness Father     severe depression  . Arthritis Mother   . Diabetes Mother   . Hypertension Other     Parent  . Heart disease Other  Grandparent  . Stroke Other     grandparent  . Multiple sclerosis Sister      Current Outpatient Prescriptions:  .  dexamethasone (DECADRON) 4 MG tablet, Take 1 tablet (4 mg total) by mouth daily. Taper schedule for low platelets.  Take 5 pills a day for 4 days, then 3 pills a day for 2 days then 1 pill a day for 2 days., Disp: 100 tablet, Rfl: 2 .  nebivolol (BYSTOLIC) 5 MG tablet, Take 1 tablet (5 mg total) by mouth daily. Physical due in March must see MD for refills, Disp: 90 tablet, Rfl: 0 .  Rivaroxaban (XARELTO) 10 MG TABS tablet, Take 1 tablet (10 mg total) by mouth daily with supper., Disp: 30 tablet, Rfl: 12 .   valsartan-hydrochlorothiazide (DIOVAN-HCT) 320-25 MG tablet, Take 1 tablet by mouth daily. Yearly physical w/labs due in March must see MD for refills, Disp: 90 tablet, Rfl: 0 .  vitamin B-12 (CYANOCOBALAMIN) 100 MCG tablet, Take 100 mcg by mouth daily., Disp: , Rfl:     Review of Systems     Objective:   Physical Exam Vitals:   02/18/17 0939  BP: (!) 142/88  Pulse: 67  SpO2: 97%  Weight: 190 lb (86.2 kg)  Height: 5\' 4"  (1.626 m)    Estimated body mass index is 32.61 kg/m as calculated from the following:   Height as of this encounter: 5\' 4"  (1.626 m).   Weight as of this encounter: 190 lb (86.2 kg). Focused exam shows clear lung fields. Normal heart sounds. Alert and oriented 3. No edema and no cyanosis or clubbing. Skin appears intact in exposed areas. Gait is normal.    Assessment:       ICD-9-CM ICD-10-CM   1. Saddle embolus of pulmonary artery without acute cor pulmonale, unspecified chronicity (HCC) 415.13 I26.92        Plan:     Saddle embolus of pulmonary artery ([place]) 2 years almost since first and only PE D-dimer normal June 2017 and April 2018 Risk for recurrence is low given normal d-dimer and fact you are on low dose xarelto Glad no shortness of breath  Plan We discussed and agreed that you will take d-dimer low dose at 10mg  preventative indefinitely We agreed that aspirin is not a good fit for you given ITP  followup Dr Marin Olp for this As needed with pumonary     Dr. Brand Males, M.D., [facility].C.P Pulmonary and Critical Care Medicine Staff Physician [facility] Pager: [phone], If no answer or between  15:00h - 7:00h: call [phone]  02/18/2017 10:04 AM

## 2017-02-20 ENCOUNTER — Encounter: Payer: Self-pay | Admitting: Internal Medicine

## 2017-02-20 ENCOUNTER — Ambulatory Visit (INDEPENDENT_AMBULATORY_CARE_PROVIDER_SITE_OTHER): Payer: 59 | Admitting: Internal Medicine

## 2017-02-20 VITALS — BP 146/92 | HR 66 | Temp 97.7°F | Resp 16 | Ht 64.0 in | Wt 189.0 lb

## 2017-02-20 DIAGNOSIS — I10 Essential (primary) hypertension: Secondary | ICD-10-CM | POA: Diagnosis not present

## 2017-02-20 DIAGNOSIS — D696 Thrombocytopenia, unspecified: Secondary | ICD-10-CM

## 2017-02-20 DIAGNOSIS — R739 Hyperglycemia, unspecified: Secondary | ICD-10-CM

## 2017-02-20 DIAGNOSIS — Z0000 Encounter for general adult medical examination without abnormal findings: Secondary | ICD-10-CM | POA: Diagnosis not present

## 2017-02-20 MED ORDER — NEBIVOLOL HCL 5 MG PO TABS: 5.0000 mg | ORAL_TABLET | Freq: Every day | ORAL | 1 refills | Status: DC

## 2017-02-20 MED ORDER — VALSARTAN-HYDROCHLOROTHIAZIDE 320-25 MG PO TABS: 1.0000 | ORAL_TABLET | Freq: Every day | ORAL | 1 refills | Status: DC

## 2017-02-20 NOTE — Progress Notes (Signed)
Pre visit review using our clinic review tool, if applicable. No additional management support is needed unless otherwise documented below in the visit note. 

## 2017-02-20 NOTE — Patient Instructions (Addendum)
Call insurance - ask if cologuard is covered and let me know so we can order it.  Call gyn and schedule an appointment.  Schedule your mammogram.    Check on immunizations since you have had a splenectomy - check at work or with Dr Marin Olp.   A prescription for blood work was given.  Test(s) ordered today. Your results will be released to [place] (or called to you) after review, usually within 72hours after test completion. If any changes need to be made, you will be notified at that same time.  All other Health Maintenance issues reviewed.   All recommended immunizations and age-appropriate screenings are up-to-date or discussed.  No immunizations administered today.   Medications reviewed and updated.  No changes recommended at this time.  Your prescription(s) have been submitted to your pharmacy. Please take as directed and contact our office if you believe you are having problem(s) with the medication(s).  Please followup in one year   Health Maintenance, Female Adopting a healthy lifestyle and getting preventive care can go a long way to promote health and wellness. Talk with your health care provider about what schedule of regular examinations is right for you. This is a good chance for you to check in with your provider about disease prevention and staying healthy. In between checkups, there are plenty of things you can do on your own. Experts have done a lot of research about which lifestyle changes and preventive measures are most likely to keep you healthy. Ask your health care provider for more information. Weight and diet Eat a healthy diet  Be sure to include plenty of vegetables, fruits, low-fat dairy products, and lean protein.  Do not eat a lot of foods high in solid fats, added sugars, or salt.  Get regular exercise. This is one of the most important things you can do for your health.  Most adults should exercise for at least 150 minutes each week. The exercise should  increase your heart rate and make you sweat (moderate-intensity exercise).  Most adults should also do strengthening exercises at least twice a week. This is in addition to the moderate-intensity exercise. Maintain a healthy weight  Body mass index (BMI) is a measurement that can be used to identify possible weight problems. It estimates body fat based on height and weight. Your health care provider can help determine your BMI and help you achieve or maintain a healthy weight.  For females 3 years of age and older:  A BMI below 18.5 is considered underweight.  A BMI of 18.5 to 24.9 is normal.  A BMI of 25 to 29.9 is considered overweight.  A BMI of 30 and above is considered obese. Watch levels of cholesterol and blood lipids  You should start having your blood tested for lipids and cholesterol at 54 years of age, then have this test every 5 years.  You may need to have your cholesterol levels checked more often if:  Your lipid or cholesterol levels are high.  You are older than 54 years of age.  You are at high risk for heart disease. Cancer screening Lung Cancer  Lung cancer screening is recommended for adults 30-30 years old who are at high risk for lung cancer because of a history of smoking.  A yearly low-dose CT scan of the lungs is recommended for people who:  Currently smoke.  Have quit within the past 15 years.  Have at least a 30-pack-year history of smoking. A pack year is  smoking an average of one pack of cigarettes a day for 1 year.  Yearly screening should continue until it has been 15 years since you quit.  Yearly screening should stop if you develop a health problem that would prevent you from having lung cancer treatment. Breast Cancer  Practice breast self-awareness. This means understanding how your breasts normally appear and feel.  It also means doing regular breast self-exams. Let your health care provider know about any changes, no matter how  small.  If you are in your 20s or 30s, you should have a clinical breast exam (CBE) by a health care provider every 1-3 years as part of a regular health exam.  If you are 13 or older, have a CBE every year. Also consider having a breast X-ray (mammogram) every year.  If you have a family history of breast cancer, talk to your health care provider about genetic screening.  If you are at high risk for breast cancer, talk to your health care provider about having an MRI and a mammogram every year.  Breast cancer gene (BRCA) assessment is recommended for women who have family members with BRCA-related cancers. BRCA-related cancers include:  Breast.  Ovarian.  Tubal.  Peritoneal cancers.  Results of the assessment will determine the need for genetic counseling and BRCA1 and BRCA2 testing. Cervical Cancer  Your health care provider may recommend that you be screened regularly for cancer of the pelvic organs (ovaries, uterus, and vagina). This screening involves a pelvic examination, including checking for microscopic changes to the surface of your cervix (Pap test). You may be encouraged to have this screening done every 3 years, beginning at age 18.  For women ages 38-65, health care providers may recommend pelvic exams and Pap testing every 3 years, or they may recommend the Pap and pelvic exam, combined with testing for human papilloma virus (HPV), every 5 years. Some types of HPV increase your risk of cervical cancer. Testing for HPV may also be done on women of any age with unclear Pap test results.  Other health care providers may not recommend any screening for nonpregnant women who are considered low risk for pelvic cancer and who do not have symptoms. Ask your health care provider if a screening pelvic exam is right for you.  If you have had past treatment for cervical cancer or a condition that could lead to cancer, you need Pap tests and screening for cancer for at least 20 years  after your treatment. If Pap tests have been discontinued, your risk factors (such as having a new sexual partner) need to be reassessed to determine if screening should resume. Some women have medical problems that increase the chance of getting cervical cancer. In these cases, your health care provider may recommend more frequent screening and Pap tests. Colorectal Cancer  This type of cancer can be detected and often prevented.  Routine colorectal cancer screening usually begins at 54 years of age and continues through 54 years of age.  Your health care provider may recommend screening at an earlier age if you have risk factors for colon cancer.  Your health care provider may also recommend using home test kits to check for hidden blood in the stool.  A small camera at the end of a tube can be used to examine your colon directly (sigmoidoscopy or colonoscopy). This is done to check for the earliest forms of colorectal cancer.  Routine screening usually begins at age 31.  Direct examination of the  colon should be repeated every 5-10 years through 54 years of age. However, you may need to be screened more often if early forms of precancerous polyps or small growths are found. Skin Cancer  Check your skin from head to toe regularly.  Tell your health care provider about any new moles or changes in moles, especially if there is a change in a mole's shape or color.  Also tell your health care provider if you have a mole that is larger than the size of a pencil eraser.  Always use sunscreen. Apply sunscreen liberally and repeatedly throughout the day.  Protect yourself by wearing long sleeves, pants, a wide-brimmed hat, and sunglasses whenever you are outside. Heart disease, diabetes, and high blood pressure  High blood pressure causes heart disease and increases the risk of stroke. High blood pressure is more likely to develop in:  People who have blood pressure in the high end of the  normal range (130-139/85-89 mm Hg).  People who are overweight or obese.  People who are African American.  If you are 78-40 years of age, have your blood pressure checked every 3-5 years. If you are 70 years of age or older, have your blood pressure checked every year. You should have your blood pressure measured twice-once when you are at a hospital or clinic, and once when you are not at a hospital or clinic. Record the average of the two measurements. To check your blood pressure when you are not at a hospital or clinic, you can use:  An automated blood pressure machine at a pharmacy.  A home blood pressure monitor.  If you are between 31 years and 9 years old, ask your health care provider if you should take aspirin to prevent strokes.  Have regular diabetes screenings. This involves taking a blood sample to check your fasting blood sugar level.  If you are at a normal weight and have a low risk for diabetes, have this test once every three years after 54 years of age.  If you are overweight and have a high risk for diabetes, consider being tested at a younger age or more often. Preventing infection Hepatitis B  If you have a higher risk for hepatitis B, you should be screened for this virus. You are considered at high risk for hepatitis B if:  You were born in a country where hepatitis B is common. Ask your health care provider which countries are considered high risk.  Your parents were born in a high-risk country, and you have not been immunized against hepatitis B (hepatitis B vaccine).  You have HIV or AIDS.  You use needles to inject street drugs.  You live with someone who has hepatitis B.  You have had sex with someone who has hepatitis B.  You get hemodialysis treatment.  You take certain medicines for conditions, including cancer, organ transplantation, and autoimmune conditions. Hepatitis C  Blood testing is recommended for:  Everyone born from [birth year] through  1963.  Anyone with known risk factors for hepatitis C. Sexually transmitted infections (STIs)  You should be screened for sexually transmitted infections (STIs) including gonorrhea and chlamydia if:  You are sexually active and are younger than 54 years of age.  You are older than 54 years of age and your health care provider tells you that you are at risk for this type of infection.  Your sexual activity has changed since you were last screened and you are at an increased risk for chlamydia  or gonorrhea. Ask your health care provider if you are at risk.  If you do not have HIV, but are at risk, it may be recommended that you take a prescription medicine daily to prevent HIV infection. This is called pre-exposure prophylaxis (PrEP). You are considered at risk if:  You are sexually active and do not regularly use condoms or know the HIV status of your partner(s).  You take drugs by injection.  You are sexually active with a partner who has HIV. Talk with your health care provider about whether you are at high risk of being infected with HIV. If you choose to begin PrEP, you should first be tested for HIV. You should then be tested every 3 months for as long as you are taking PrEP. Pregnancy  If you are premenopausal and you may become pregnant, ask your health care provider about preconception counseling.  If you may become pregnant, take 400 to 800 micrograms (mcg) of folic acid every day.  If you want to prevent pregnancy, talk to your health care provider about birth control (contraception). Osteoporosis and menopause  Osteoporosis is a disease in which the bones lose minerals and strength with aging. This can result in serious bone fractures. Your risk for osteoporosis can be identified using a bone density scan.  If you are 75 years of age or older, or if you are at risk for osteoporosis and fractures, ask your health care provider if you should be screened.  Ask your health  care provider whether you should take a calcium or vitamin D supplement to lower your risk for osteoporosis.  Menopause may have certain physical symptoms and risks.  Hormone replacement therapy may reduce some of these symptoms and risks. Talk to your health care provider about whether hormone replacement therapy is right for you. Follow these instructions at home:  Schedule regular health, dental, and eye exams.  Stay current with your immunizations.  Do not use any tobacco products including cigarettes, chewing tobacco, or electronic cigarettes.  If you are pregnant, do not drink alcohol.  If you are breastfeeding, limit how much and how often you drink alcohol.  Limit alcohol intake to no more than 1 drink per day for nonpregnant women. One drink equals 12 ounces of beer, 5 ounces of wine, or 1 ounces of hard liquor.  Do not use street drugs.  Do not share needles.  Ask your health care provider for help if you need support or information about quitting drugs.  Tell your health care provider if you often feel depressed.  Tell your health care provider if you have ever been abused or do not feel safe at home. This information is not intended to replace advice given to you by your health care provider. Make sure you discuss any questions you have with your health care provider. Document Released: 04/21/2011 Document Revised: 03/13/2016 Document Reviewed: 07/10/2015 Elsevier Interactive Patient Education  2017 [employer].

## 2017-02-20 NOTE — Assessment & Plan Note (Signed)
Completing decadon today  Managed by Dr Marin Olp

## 2017-02-20 NOTE — Assessment & Plan Note (Signed)
Check a1c 

## 2017-02-20 NOTE — Progress Notes (Signed)
Subjective:    Patient ID: Janice Martin, female    DOB: Apr 18, 1961, 56 y.o.   MRN: [id number]  HPI She is here for a physical exam.   Her BP has been controlled at home.  Her BP is elevated now due to Decadron. She is taking her medication daily as prescribed.    She has not been exercising regularly, but will restart.   Medications and allergies reviewed with patient and updated if appropriate.  Patient Active Problem List   Diagnosis Date Noted  . Hyperglycemia 10/18/2015  . Chronic deep vein thrombosis (DVT) of distal vein of lower extremity ([place]) 04/04/2015  . Severe thrombocytopenia ([place]) 03/25/2015  . Saddle embolus of pulmonary artery ([place]) 03/24/2015  . LEUKOCYTOSIS UNSPECIFIED 06/17/2010  . IMMUNE THROMBOCYTOPENIC PURPURA 05/10/2010  . Anxiety state 05/10/2010  . Essential hypertension 05/10/2010    Current Outpatient Prescriptions on File Prior to Visit  Medication Sig Dispense Refill  . dexamethasone (DECADRON) 4 MG tablet Take 1 tablet (4 mg total) by mouth daily. Taper schedule for low platelets.  Take 5 pills a day for 4 days, then 3 pills a day for 2 days then 1 pill a day for 2 days. 100 tablet 2  . nebivolol (BYSTOLIC) 5 MG tablet Take 1 tablet (5 mg total) by mouth daily. Physical due in March must see MD for refills 90 tablet 0  . Rivaroxaban (XARELTO) 10 MG TABS tablet Take 1 tablet (10 mg total) by mouth daily with supper. 30 tablet 12  . valsartan-hydrochlorothiazide (DIOVAN-HCT) 320-25 MG tablet Take 1 tablet by mouth daily. Yearly physical w/labs due in March must see MD for refills 90 tablet 0  . vitamin B-12 (CYANOCOBALAMIN) 100 MCG tablet Take 100 mcg by mouth daily.     No current facility-administered medications on file prior to visit.     Past Medical History:  Diagnosis Date  . ANEMIA-IRON DEFICIENCY   . Anxiety state, unspecified   . DVT of leg (deep venous thrombosis) ([place]) 04/04/2015  . HYPERTENSION   . Immune thrombocytopenic purpura  ([place]) 2003 dx   s/p rituxan 06/2012 - now remission  . LEUKOCYTOSIS UNSPECIFIED   . Pulmonary embolism [facility]) June 2016   chronic anticoag due to high risk recurrence    Past Surgical History:  Procedure Laterality Date  . CESAREAN SECTION     x's 2  . CHOLECYSTECTOMY    . Spleenectomy      Social History   Social History  . Marital status: Married    Spouse name: N/A  . Number of children: N/A  . Years of education: N/A   Social History Main Topics  . Smoking status: Never Smoker  . Smokeless tobacco: Never Used     Comment: never used product  . Alcohol use No  . Drug use: No  . Sexual activity: Not on file   Other Topics Concern  . Not on file   Social History Narrative  . No narrative on file    Family History  Problem Relation Age of Onset  . Arthritis Father   . Mental illness Father     severe depression  . Arthritis Mother   . Diabetes Mother   . Hypertension Other     Parent  . Heart disease Other     Grandparent  . Stroke Other     grandparent  . Multiple sclerosis Sister     Review of Systems  Constitutional: Negative for appetite change, chills, fatigue  and fever.  Eyes: Negative for visual disturbance.  Respiratory: Negative for cough, shortness of breath and wheezing.   Cardiovascular: Negative for chest pain, palpitations and leg swelling.  Gastrointestinal: Negative for abdominal pain, blood in stool, constipation, diarrhea and nausea.       No gerd  Genitourinary: Negative for dysuria and hematuria.  Musculoskeletal: Negative for arthralgias and back pain.  Skin: Negative for color change and rash.  Neurological: Negative for light-headedness and headaches.  Psychiatric/Behavioral: Negative for dysphoric mood. The patient is not nervous/anxious.        Objective:   Vitals:   02/20/17 1351  BP: (!) 146/92  Pulse: 66  Resp: 16  Temp: 97.7 F (36.5 C)   Filed Weights   02/20/17 1351  Weight: 189 lb (85.7 kg)   Body mass  index is 32.44 kg/m.  Wt Readings from Last 3 Encounters:  02/20/17 189 lb (85.7 kg)  02/18/17 190 lb (86.2 kg)  01/16/17 188 lb (85.3 kg)     Physical Exam Constitutional: She appears well-developed and well-nourished. No distress.  HENT:  Head: Normocephalic and atraumatic.  Right Ear: External ear normal. Normal ear canal and TM Left Ear: External ear normal.  Normal ear canal and TM Mouth/Throat: Oropharynx is clear and moist.  Eyes: Conjunctivae and EOM are normal.  Neck: Neck supple. No tracheal deviation present. No thyromegaly present.  No carotid bruit  Cardiovascular: Normal rate, regular rhythm and normal heart sounds.   No murmur heard.  No edema. Pulmonary/Chest: Effort normal and breath sounds normal. No respiratory distress. She has no wheezes. She has no rales.  Breast: deferred to Gyn Abdominal: Soft. She exhibits no distension. There is no tenderness.  Lymphadenopathy: She has no cervical adenopathy.  Skin: Skin is warm and dry. She is not diaphoretic.  Psychiatric: She has a normal mood and affect. Her behavior is normal.         Assessment & Plan:   Physical exam: Screening blood work  ordered Immunizations ? Up to date related splenectomy - advised her to check Colonoscopy - deferred colonoscopy. Will look into cologuard Mammogram -- due -- will schedule Gyn - due -  Will schedule Eye exams   Up to date  EKG -   done 04/2015 Exercise - none now - plans on restarting Weight - knows she needs to lose weight Skin no coneerns Substance abuse  none  See Problem List for Assessment and Plan of chronic medical problems.  FU annually

## 2017-02-20 NOTE — Assessment & Plan Note (Signed)
Well controlled at home - elevated here due to decadron She will continue to monitor at home Continue current medication

## 2017-02-21 DIAGNOSIS — H524 Presbyopia: Secondary | ICD-10-CM | POA: Diagnosis not present

## 2017-02-24 MED FILL — VALSARTAN-HCTZ 320-25 MG TA: 320-25 | 90 days supply | Qty: 90 | Fill #0

## 2017-02-24 MED FILL — BYSTOLIC 5 MG TABLET: 5 | 90 days supply | Qty: 90 | Fill #0

## 2017-03-17 ENCOUNTER — Emergency Department (HOSPITAL_COMMUNITY): Payer: PRIVATE HEALTH INSURANCE

## 2017-03-17 ENCOUNTER — Encounter (HOSPITAL_COMMUNITY): Payer: Self-pay | Admitting: Emergency Medicine

## 2017-03-17 ENCOUNTER — Telehealth: Payer: Self-pay | Admitting: *Deleted

## 2017-03-17 ENCOUNTER — Emergency Department (HOSPITAL_COMMUNITY)
Admission: EM | Admit: 2017-03-17 | Discharge: 2017-03-17 | Disposition: A | Discharge status: Home / Self Care | Payer: PRIVATE HEALTH INSURANCE | Attending: Emergency Medicine | Admitting: Emergency Medicine

## 2017-03-17 DIAGNOSIS — S46912A Strain of unspecified muscle, fascia and tendon at shoulder and upper arm level, left arm, initial encounter: Secondary | ICD-10-CM | POA: Insufficient documentation

## 2017-03-17 DIAGNOSIS — W01198A Fall on same level from slipping, tripping and stumbling with subsequent striking against other object, initial encounter: Secondary | ICD-10-CM | POA: Diagnosis not present

## 2017-03-17 DIAGNOSIS — Y939 Activity, unspecified: Secondary | ICD-10-CM | POA: Insufficient documentation

## 2017-03-17 DIAGNOSIS — S0003XA Contusion of scalp, initial encounter: Secondary | ICD-10-CM | POA: Diagnosis not present

## 2017-03-17 DIAGNOSIS — Z79899 Other long term (current) drug therapy: Secondary | ICD-10-CM | POA: Diagnosis not present

## 2017-03-17 DIAGNOSIS — I10 Essential (primary) hypertension: Secondary | ICD-10-CM | POA: Insufficient documentation

## 2017-03-17 DIAGNOSIS — Y999 Unspecified external cause status: Secondary | ICD-10-CM | POA: Diagnosis not present

## 2017-03-17 DIAGNOSIS — S4992XA Unspecified injury of left shoulder and upper arm, initial encounter: Secondary | ICD-10-CM | POA: Diagnosis not present

## 2017-03-17 DIAGNOSIS — Z7901 Long term (current) use of anticoagulants: Secondary | ICD-10-CM | POA: Insufficient documentation

## 2017-03-17 DIAGNOSIS — Y92002 Bathroom of unspecified non-institutional (private) residence single-family (private) house as the place of occurrence of the external cause: Secondary | ICD-10-CM | POA: Diagnosis not present

## 2017-03-17 DIAGNOSIS — M25512 Pain in left shoulder: Secondary | ICD-10-CM | POA: Diagnosis not present

## 2017-03-17 NOTE — Discharge Instructions (Signed)
Tylenol for pain.   Continue current medications

## 2017-03-17 NOTE — ED Triage Notes (Signed)
Pt reports being in the bathroom and tripping over the threshold and hitting her head on the sink. Neuro intact.  A&O x4. Small swollen area noted to back of crown of head.  No break in skin. Pt ambulatory.  On blood thinners. Denies any symptoms at this time.  Denies pain at this time.

## 2017-03-17 NOTE — ED Notes (Signed)
Bed: FX90 Expected date:  Expected time:  Means of arrival:  Comments: Pt in lobby RF [id number]

## 2017-03-17 NOTE — ED Provider Notes (Deleted)
[facility] DEPT Provider Note   CSN: [id number] Arrival date & time: 03/17/17  1226     History   Chief Complaint Chief Complaint  Patient presents with  . Fall    HPI Janice Martin is a 56 y.o. female.  The history is provided by the patient. No language interpreter was used.  Fall  This is a new problem. The current episode started 1 to 2 hours ago. The problem occurs constantly. The problem has not changed since onset.Nothing aggravates the symptoms. Nothing relieves the symptoms. She has tried nothing for the symptoms. The treatment provided no relief.  Pt tripped over threshold going into bathroom.  Pt reports she hit her head.  Pt states she can not feel knot any longer.  No current headache. No neurologic symptoms.  Pt complains of soreness and difficulty lifting her left arm  Past Medical History:  Diagnosis Date  . ANEMIA-IRON DEFICIENCY   . Anxiety state, unspecified   . DVT of leg (deep venous thrombosis) ([place]) 04/04/2015  . HYPERTENSION   . Immune thrombocytopenic purpura ([place]) 2003 dx   s/p rituxan 06/2012 - now remission  . LEUKOCYTOSIS UNSPECIFIED   . Pulmonary embolism [facility]) June 2016   chronic anticoag due to high risk recurrence    Patient Active Problem List   Diagnosis Date Noted  . Hyperglycemia 10/18/2015  . Chronic deep vein thrombosis (DVT) of distal vein of lower extremity ([place]) 04/04/2015  . Severe thrombocytopenia ([place]) 03/25/2015  . 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 IMMUNE THROMBOCYTOPENIC PURPURA 05/10/2010  . Essential hypertension 05/10/2010    Past Surgical History:  Procedure Laterality Date  . CESAREAN SECTION     x's 2  . CHOLECYSTECTOMY    . Spleenectomy  1985 or 1986   due to ITP    OB History    No data available       Home Medications    Prior to Admission medications   Medication Sig Start Date End Date Taking? Authorizing Provider  nebivolol (BYSTOLIC) 5 MG tablet Take 1 tablet (5 mg  total) by mouth daily. Patient taking differently: Take 5 mg by mouth at bedtime.  02/20/17  Yes Burns, Claudina Lick, MD  Rivaroxaban (XARELTO) 10 MG TABS tablet Take 1 tablet (10 mg total) by mouth daily with supper. 01/16/17  Yes Ennever, Rudell Cobb, MD  valsartan-hydrochlorothiazide (DIOVAN-HCT) 320-25 MG tablet Take 1 tablet by mouth daily. Patient taking differently: Take 1 tablet by mouth at bedtime.  02/20/17  Yes Burns, Claudina Lick, MD  vitamin B-12 (CYANOCOBALAMIN) 100 MCG tablet Take 100 mcg by mouth at bedtime.    Yes [provider]  dexamethasone (DECADRON) 4 MG tablet Take 1 tablet (4 mg total) by mouth daily. Taper schedule for low platelets.  Take 5 pills a day for 4 days, then 3 pills a day for 2 days then 1 pill a day for 2 days. Patient not taking: Reported on 03/17/2017 02/13/17   [name], MD    Family History Family History  Problem Relation Age of Onset  . Arthritis Father   . Mental illness Father        severe depression  . Arthritis Mother   . Diabetes Mother   . Hypertension Other        Parent  . Heart disease Other        Grandparent  . Stroke Other        grandparent  . Multiple sclerosis Sister  Social History Social History  Substance Use Topics  . Smoking status: Never Smoker  . Smokeless tobacco: Never Used     Comment: never used product  . Alcohol use No     Allergies   Bee venom; Shellfish allergy; Influenza vaccines; and Morphine   Review of Systems Review of Systems  All other systems reviewed and are negative.    Physical Exam Updated Vital Signs BP (!) 156/86   Pulse 87   Temp 98.1 F (36.7 C)   Resp 17   Ht 5\' 4"  (1.626 m)   Wt 83.9 kg (185 lb)   LMP 02/22/2015   SpO2 100%   BMI 31.76 kg/m   Physical Exam  Constitutional: She is oriented to person, place, and time. She appears well-developed and well-nourished.  HENT:  Head: Normocephalic.  Right Ear: External ear normal.  Left Ear: External ear normal.    Nose: Nose normal.  Mouth/Throat: Oropharynx is clear and moist.  Eyes: Conjunctivae and EOM are normal. Pupils are equal, round, and reactive to light.  Neck: Normal range of motion.  Cardiovascular: Normal rate and regular rhythm.   Pulmonary/Chest: Effort normal.  Abdominal: She exhibits no distension.  Neurological: She is alert and oriented to person, place, and time.  Psychiatric: She has a normal mood and affect.  Nursing note and vitals reviewed.    ED Treatments / Results  Labs (all labs ordered are listed, but only abnormal results are displayed) Labs Reviewed - No data to display  EKG  EKG Interpretation None       Radiology No results found.  Procedures Procedures (including critical care time)  Medications Ordered in ED Medications - No data to display   Initial Impression / Assessment and Plan / ED Course  I have reviewed the triage vital signs and the nursing notes.  Pertinent labs & imaging results that were available during my care of the patient were reviewed by me and considered in my medical decision making (see chart for details).     Shoulder no fracture, ct head negative,  Pt advised ice to areas of soreness, follow up with Orthopaedist if shoulder pain persist past one week.  Final Clinical Impressions(s) / ED Diagnoses   Final diagnoses:  Contusion of scalp, initial encounter  Shoulder strain, left, initial encounter    New Prescriptions New Prescriptions   No medications on file  An After Visit Summary was printed and given to the patient.   Fransico Meadow, [place] 03/17/17 1528

## 2017-03-17 NOTE — Telephone Encounter (Signed)
Patient just fell at work at hit the back of her head on the sink. She has a small knot. She says she feels okay. She is in Xarelto. She wants to know if she should be assessed.  Since patient had a fall with a head injury PLUS currently on blood thinners, she has been instructed to go to the ED for work up to rule out any significant injury. She understands and will go now.  Dr Marin Olp notified.

## 2017-03-17 NOTE — ED Provider Notes (Deleted)
[place] DEPT Provider Note   CSN: [id number] Arrival date & time: 03/17/17  1226     History   Chief Complaint Chief Complaint  Patient presents with  . Fall    HPI Janice Martin is a 56 y.o. female.  HPI  Past Medical History:  Diagnosis Date  . ANEMIA-IRON DEFICIENCY   . Anxiety state, unspecified   . DVT of leg (deep venous thrombosis) ([place]) 04/04/2015  . HYPERTENSION   . Immune thrombocytopenic purpura ([place]) 2003 dx   s/p rituxan 06/2012 - now remission  . LEUKOCYTOSIS UNSPECIFIED   . Pulmonary embolism [facility]) June 2016   chronic anticoag due to high risk recurrence    Patient Active Problem List   Diagnosis Date Noted  . Hyperglycemia 10/18/2015  . Chronic deep vein thrombosis (DVT) of distal vein of lower extremity ([place]) 04/04/2015  . Severe thrombocytopenia ([place]) 03/25/2015  . Saddle embolus of pulmonary artery ([place]) 03/24/2015  . IMMUNE THROMBOCYTOPENIC PURPURA 05/10/2010  . Essential hypertension 05/10/2010    Past Surgical History:  Procedure Laterality Date  . CESAREAN SECTION     x's 2  . CHOLECYSTECTOMY    . Spleenectomy  1985 or 1986   due to ITP    OB History    No data available       Home Medications    Prior to Admission medications   Medication Sig Start Date End Date Taking? Authorizing Provider  nebivolol (BYSTOLIC) 5 MG tablet Take 1 tablet (5 mg total) by mouth daily. Patient taking differently: Take 5 mg by mouth at bedtime.  02/20/17  Yes Burns, Claudina Lick, MD  Rivaroxaban (XARELTO) 10 MG TABS tablet Take 1 tablet (10 mg total) by mouth daily with supper. 01/16/17  Yes Ennever, Rudell Cobb, MD  valsartan-hydrochlorothiazide (DIOVAN-HCT) 320-25 MG tablet Take 1 tablet by mouth daily. Patient taking differently: Take 1 tablet by mouth at bedtime.  02/20/17  Yes Burns, Claudina Lick, MD  vitamin B-12 (CYANOCOBALAMIN) 100 MCG tablet Take 100 mcg by mouth at bedtime.    Yes [provider]  dexamethasone (DECADRON) 4 MG tablet Take 1  tablet (4 mg total) by mouth daily. Taper schedule for low platelets.  Take 5 pills a day for 4 days, then 3 pills a day for 2 days then 1 pill a day for 2 days. Patient not taking: Reported on 03/17/2017 02/13/17   [name], MD    Family History Family History  Problem Relation Age of Onset  . Arthritis Father   . Mental illness Father        severe depression  . Arthritis Mother   . Diabetes Mother   . Hypertension Other        Parent  . Heart disease Other        Grandparent  . Stroke Other        grandparent  . Multiple sclerosis Sister     Social History Social History  Substance Use Topics  . Smoking status: Never Smoker  . Smokeless tobacco: Never Used     Comment: never used product  . Alcohol use No     Allergies   Bee venom; Shellfish allergy; Influenza vaccines; and Morphine   Review of Systems Review of Systems   Physical Exam Updated Vital Signs BP (!) 156/86   Pulse 87   Temp 98.1 F (36.7 C)   Resp 17   Ht 5\' 4"  (1.626 m)   Wt 83.9 kg (185 lb)  LMP 02/22/2015   SpO2 100%   BMI 31.76 kg/m   Physical Exam   ED Treatments / Results  Labs (all labs ordered are listed, but only abnormal results are displayed) Labs Reviewed - No data to display  EKG  EKG Interpretation None       Radiology Dg Shoulder Left  Result Date: 03/17/2017 CLINICAL DATA:  Pain following fall EXAM: LEFT SHOULDER - 2+ VIEW COMPARISON:  None. FINDINGS: Frontal, Y scapular, and axillary images were obtained. There is no fracture or dislocation. The joint spaces appear unremarkable. No erosive change. There is a small exostosis arising from the inferior distal clavicle. Visualized left lung is clear. IMPRESSION: Small benign exostosis arising from the inferolateral clavicle. No fracture or dislocation. No appreciable arthropathy. Electronically Signed   By: Lowella Grip III M.D.   On: 03/17/2017 15:10    Procedures Procedures (including critical care  time)  Medications Ordered in ED Medications - No data to display   Initial Impression / Assessment and Plan / ED Course  I have reviewed the triage vital signs and the nursing notes.  Pertinent labs & imaging results that were available during my care of the patient were reviewed by me and considered in my medical decision making (see chart for details).     Shoulder no fracture.  Pt counseled on possible ligamentous injury and need to see Orthopaedist if pain persist past one week,   Final Clinical Impressions(s) / ED Diagnoses   Final diagnoses:  Contusion of scalp, initial encounter  Shoulder strain, left, initial encounter    New Prescriptions New Prescriptions   No medications on file  An After Visit Summary was printed and given to the patient.    Fransico Meadow, [place] 03/17/17 1526    Margette Fast, MD 03/17/17 [phone]

## 2017-03-19 ENCOUNTER — Other Ambulatory Visit: Payer: Self-pay | Admitting: Hematology & Oncology

## 2017-03-19 DIAGNOSIS — D693 Immune thrombocytopenic purpura: Secondary | ICD-10-CM

## 2017-03-19 DIAGNOSIS — I2782 Chronic pulmonary embolism: Secondary | ICD-10-CM

## 2017-03-19 DIAGNOSIS — I2692 Saddle embolus of pulmonary artery without acute cor pulmonale: Secondary | ICD-10-CM

## 2017-03-19 MED FILL — XARELTO 10 MG TABLET: 10 | 90 days supply | Qty: 90 | Fill #0

## 2017-03-23 NOTE — ED Provider Notes (Signed)
CSN: [id number]     Arrival date & time 03/17/17  1226 History   None    Chief Complaint  Patient presents with  . Fall   (Consider location/radiation/quality/duration/timing/severity/associated sxs/prior Treatment) The history is provided by the patient. No language interpreter was used.  Fall  This is a new problem. The current episode started 1 to 2 hours ago. The problem occurs constantly. The problem has been gradually worsening. Nothing aggravates the symptoms. Nothing relieves the symptoms. She has tried nothing for the symptoms. The treatment provided no relief.  Pt tripped in the bathroom and fell hitting her head and her shoulder.  Pt reports she had a knot on the back of her head which is gone now.  Pt is on xarelto.  Pt reports she is not having a headache.   Past Medical History:  Diagnosis Date  . ANEMIA-IRON DEFICIENCY   . Anxiety state, unspecified   . DVT of leg (deep venous thrombosis) ([place]) 04/04/2015  . HYPERTENSION   . Immune thrombocytopenic purpura ([place]) 2003 dx   s/p rituxan 06/2012 - now remission  . LEUKOCYTOSIS UNSPECIFIED   . Pulmonary embolism [facility]) June 2016   chronic anticoag due to high risk recurrence   Past Surgical History:  Procedure Laterality Date  . CESAREAN SECTION     x's 2  . CHOLECYSTECTOMY    . Spleenectomy  1985 or 1986   due to ITP   Family History  Problem Relation Age of Onset  . Arthritis Father   . Mental illness Father        severe depression  . Arthritis Mother   . Diabetes Mother   . Hypertension Other        Parent  . Heart disease Other        Grandparent  . Stroke Other        grandparent  . Multiple sclerosis Sister    Social History  Substance Use Topics  . Smoking status: Never Smoker  . Smokeless tobacco: Never Used     Comment: never used product  . Alcohol use No   OB History    No data available     Review of Systems  All other systems reviewed and are negative.   Allergies  Bee venom;  Shellfish allergy; Influenza vaccines; and Morphine  Home Medications   Prior to Admission medications   Medication Sig Start Date End Date Taking? Authorizing Provider  nebivolol (BYSTOLIC) 5 MG tablet Take 1 tablet (5 mg total) by mouth daily. Patient taking differently: Take 5 mg by mouth at bedtime.  02/20/17  Yes Burns, Claudina Lick, MD  Rivaroxaban (XARELTO) 10 MG TABS tablet Take 1 tablet (10 mg total) by mouth daily with supper. 01/16/17  Yes Ennever, Rudell Cobb, MD  valsartan-hydrochlorothiazide (DIOVAN-HCT) 320-25 MG tablet Take 1 tablet by mouth daily. Patient taking differently: Take 1 tablet by mouth at bedtime.  02/20/17  Yes Burns, Claudina Lick, MD  vitamin B-12 (CYANOCOBALAMIN) 100 MCG tablet Take 100 mcg by mouth at bedtime.    Yes [provider]  dexamethasone (DECADRON) 4 MG tablet Take 1 tablet (4 mg total) by mouth daily. Taper schedule for low platelets.  Take 5 pills a day for 4 days, then 3 pills a day for 2 days then 1 pill a day for 2 days. Patient not taking: Reported on 03/17/2017 02/13/17   [name], MD  XARELTO 10 MG TABS tablet TAKE 1 TABLET BY MOUTH DAILY WITH SUPPER. 03/19/17  [name], MD   Meds Ordered and Administered this Visit  Medications - No data to display  BP (!) 151/86 (BP Location: Left Arm)   Pulse 88   Temp 98.1 F (36.7 C)   Resp 18   Ht 5\' 4"  (1.626 m)   Wt 185 lb (83.9 kg)   LMP 02/22/2015   SpO2 99%   BMI 31.76 kg/m  No data found.   Physical Exam  Constitutional: She appears well-developed and well-nourished. No distress.  HENT:  Head: Normocephalic and atraumatic.  Right Ear: External ear normal.  Left Ear: External ear normal.  Eyes: Conjunctivae are normal.  Neck: Neck supple.  Cardiovascular: Normal rate and regular rhythm.   No murmur heard. Pulmonary/Chest: Effort normal and breath sounds normal. No respiratory distress.  Abdominal: Soft. There is no tenderness.  Musculoskeletal: She exhibits no edema.   Neurological: She is alert.  Skin: Skin is warm and dry.  Psychiatric: She has a normal mood and affect.  Nursing note and vitals reviewed.   Urgent Care Course     Procedures (including critical care time)  Labs Review Labs Reviewed - No data to display  Imaging Review No results found.   Visual Acuity Review  Right Eye Distance:   Left Eye Distance:   Bilateral Distance:    Right Eye Near:   Left Eye Near:    Bilateral Near:         MDM   1. Contusion of scalp, initial encounter   2. Shoulder strain, left, initial encounter    Head ct negative,   Shoulder xray is negative,  Pt counseled on follow up.  An After Visit Summary was printed and given to the patient.     Fransico Meadow, PA-C 03/23/17 1531    Margette Fast, MD 03/24/17 [phone]

## 2017-04-16 ENCOUNTER — Other Ambulatory Visit: Payer: Self-pay

## 2017-04-16 DIAGNOSIS — I825Z2 Chronic embolism and thrombosis of unspecified deep veins of left distal lower extremity: Secondary | ICD-10-CM

## 2017-04-16 DIAGNOSIS — D693 Immune thrombocytopenic purpura: Secondary | ICD-10-CM

## 2017-04-17 ENCOUNTER — Ambulatory Visit (HOSPITAL_BASED_OUTPATIENT_CLINIC_OR_DEPARTMENT_OTHER): Payer: 59 | Admitting: Hematology & Oncology

## 2017-04-17 ENCOUNTER — Other Ambulatory Visit (HOSPITAL_BASED_OUTPATIENT_CLINIC_OR_DEPARTMENT_OTHER): Payer: 59

## 2017-04-17 VITALS — BP 140/78 | HR 76 | Temp 98.2°F | Resp 16 | Wt 188.0 lb

## 2017-04-17 DIAGNOSIS — D693 Immune thrombocytopenic purpura: Secondary | ICD-10-CM

## 2017-04-17 DIAGNOSIS — Z7901 Long term (current) use of anticoagulants: Secondary | ICD-10-CM

## 2017-04-17 DIAGNOSIS — I825Z2 Chronic embolism and thrombosis of unspecified deep veins of left distal lower extremity: Secondary | ICD-10-CM

## 2017-04-17 DIAGNOSIS — Z86718 Personal history of other venous thrombosis and embolism: Secondary | ICD-10-CM

## 2017-04-17 DIAGNOSIS — I825Z3 Chronic embolism and thrombosis of unspecified deep veins of distal lower extremity, bilateral: Secondary | ICD-10-CM

## 2017-04-17 LAB — CMP (CANCER CENTER ONLY)
ALK PHOS: 88 U/L — AB (ref 26–84)
ALT: 14 U/L (ref 10–47)
AST: 19 U/L (ref 11–38)
Albumin: 3.8 g/dL (ref 3.3–5.5)
BUN: 11 mg/dL (ref 7–22)
CO2: 30 meq/L (ref 18–33)
Calcium: 9.3 mg/dL (ref 8.0–10.3)
Chloride: 99 mEq/L (ref 98–108)
Creat: 0.8 mg/dl (ref 0.6–1.2)
GLUCOSE: 89 mg/dL (ref 73–118)
POTASSIUM: 3.4 meq/L (ref 3.3–4.7)
Sodium: 137 mEq/L (ref 128–145)
Total Bilirubin: 0.8 mg/dl (ref 0.20–1.60)
Total Protein: 7.4 g/dL (ref 6.4–8.1)

## 2017-04-17 LAB — CBC WITH DIFFERENTIAL (CANCER CENTER ONLY)
BASO#: 0 10*3/uL (ref 0.0–0.2)
BASO%: 0.4 % (ref 0.0–2.0)
EOS%: 11.2 % — AB (ref 0.0–7.0)
Eosinophils Absolute: 1.2 10*3/uL — ABNORMAL HIGH (ref 0.0–0.5)
HCT: 39.7 % (ref 34.8–46.6)
HGB: 13.8 g/dL (ref 11.6–15.9)
LYMPH#: 4.3 10*3/uL — ABNORMAL HIGH (ref 0.9–3.3)
LYMPH%: 39.1 % (ref 14.0–48.0)
MCH: 32.2 pg (ref 26.0–34.0)
MCHC: 34.8 g/dL (ref 32.0–36.0)
MCV: 93 fL (ref 81–101)
MONO#: 0.9 10*3/uL (ref 0.1–0.9)
MONO%: 8.6 % (ref 0.0–13.0)
NEUT#: 4.4 10*3/uL (ref 1.5–6.5)
NEUT%: 40.7 % (ref 39.6–80.0)
RBC: 4.29 10*6/uL (ref 3.70–5.32)
RDW: 13.7 % (ref 11.1–15.7)
WBC: 10.9 10*3/uL — ABNORMAL HIGH (ref 3.9–10.0)

## 2017-04-17 NOTE — Progress Notes (Signed)
Hematology and Oncology Follow Up Visit  PAMELLA SAMONS [id number] [birth year] 56 y.o. 04/17/2017   Principle Diagnosis:   Chronic immune thrombocytopenia-recurrent  Submassive pulmonary embolism, with right lower extremity thrombus  Current Therapy:    Xarelto 10 mg by mouth daily     Interim History:  Ms.  Miltner is back for followup. She had another grandson born. He was born on March 23. Thankfully, everybody was healthy.  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 This has been quite stressful. A lot is going on right now.  She has had no problems with Xarelto. We have her on low-dose maintenance Xarelto. She is doing well with this.  She's had no bleeding. There's been no change in bowel or bladder habits. She's had no fever. She currently has a little bit of a sinus infection.  Overall, her performance status is ECOG 1.   Medications:  Current Outpatient Prescriptions:  .  dexamethasone (DECADRON) 4 MG tablet, Take 1 tablet (4 mg total) by mouth daily. Taper schedule for low platelets.  Take 5 pills a day for 4 days, then 3 pills a day for 2 days then 1 pill a day for 2 days. (Patient not taking: Reported on 03/17/2017), Disp: 100 tablet, Rfl: 2 .  nebivolol (BYSTOLIC) 5 MG tablet, Take 1 tablet (5 mg total) by mouth daily. (Patient taking differently: Take 5 mg by mouth at bedtime. ), Disp: 90 tablet, Rfl: 1 .  Rivaroxaban (XARELTO) 10 MG TABS tablet, Take 1 tablet (10 mg total) by mouth daily with supper., Disp: 30 tablet, Rfl: 12 .  valsartan-hydrochlorothiazide (DIOVAN-HCT) 320-25 MG tablet, Take 1 tablet by mouth daily. (Patient taking differently: Take 1 tablet by mouth at bedtime. ), Disp: 90 tablet, Rfl: 1 .  vitamin B-12 (CYANOCOBALAMIN) 100 MCG tablet, Take 100 mcg by mouth at bedtime. , Disp: , Rfl:  .  XARELTO 10 MG TABS tablet, TAKE 1 TABLET BY MOUTH DAILY WITH SUPPER., Disp: 90 tablet, Rfl: 3  Allergies:  Allergies  Allergen Reactions   . Bee Venom Anaphylaxis, Shortness Of Breath and Swelling  . Shellfish Allergy Anaphylaxis  . Influenza Vaccines     Per pt can't take mess up with her immune system  . Morphine Other (See Comments)    Burns really bad    Past Medical History, Surgical history, Social history, and Family History were reviewed and updated.  Review of Systems: As above  Physical Exam:  weight is 188 lb (85.3 kg). Her oral temperature is 98.2 F (36.8 C). Her blood pressure is 140/78 and her pulse is 76. Her respiration is 16 and oxygen saturation is 97%.   Well-developed and well-nourished white female. Head and neck exam shows no ocular or oral lesions. There is no adenopathy of the neck. Lungs are clear bilaterally. She has no rales, wheezes or rhonchi. Cardiac exam is regular rate and rhythm with no murmurs rubs or bruits. Abdomen has a well-healed splenectomy scar. She has no fluid wave. There is no palpable liver edge. Back exam no tenderness over the spine ribs or hips. Extremities no clubbing cyanosis or edema. I cannot palpate any venous cord in the right leg. She has a negative Homans sign in the right leg. She has good pulses in her distal extremities. Skin exam shows no rashes ecchymoses or petechia. Neurological exam is nonfocal.  Lab Results  Component Value Date   WBC 10.9 (H) 04/17/2017   HGB 13.8 04/17/2017  HCT 39.7 04/17/2017   MCV 93 04/17/2017   PLT 246 Platelet count consistent in citrate 04/17/2017     Chemistry      Component Value Date/Time   NA 137 04/17/2017 1411   NA 138 02/13/2017 1152   K 3.4 04/17/2017 1411   K 3.7 02/13/2017 1152   CL 99 04/17/2017 1411   CO2 30 04/17/2017 1411   CO2 29 02/13/2017 1152   BUN 11 04/17/2017 1411   BUN 10.5 02/13/2017 1152   CREATININE 0.8 04/17/2017 1411   CREATININE 1.0 02/13/2017 1152      Component Value Date/Time   CALCIUM 9.3 04/17/2017 1411   CALCIUM 9.5 02/13/2017 1152   ALKPHOS 88 (H) 04/17/2017 1411   ALKPHOS 106  02/13/2017 1152   AST 19 04/17/2017 1411   AST 17 02/13/2017 1152   ALT 14 04/17/2017 1411   ALT 18 02/13/2017 1152   BILITOT 0.80 04/17/2017 1411   BILITOT 0.31 02/13/2017 1152       Impression and Plan: Ms. Middlesworth is a 56 year old white female. She has a history of relapsed chronic immune thrombocytopenia. We treated her with Rituxan. She responded very well. She got treated back in October of 2014.  Everything looks quite good right now. I am very happy that she has done well.  The FDA has not approved a new oral agent for refractory ITP. It is Tavalisse. This hopefully will not have to be used for her but we have this to use if necessary.  She will stay on the low-dose Xarelto. She has done very well with this.  I will see her back in another 3 months.   [name], MD 6/29/20183:11 PM

## 2017-04-18 LAB — D-DIMER, QUANTITATIVE (NOT AT ARMC): D-DIMER: 0.36 mg{FEU}/L (ref 0.00–0.49)

## 2017-06-26 MED FILL — BYSTOLIC 5 MG TABLET: 5 | 90 days supply | Qty: 90 | Fill #1

## 2017-06-26 MED FILL — VALSARTAN-HCTZ 320-25 MG TA: 320-25 | 30 days supply | Qty: 30 | Fill #1

## 2017-07-10 ENCOUNTER — Other Ambulatory Visit: Payer: Self-pay

## 2017-07-10 ENCOUNTER — Ambulatory Visit: Payer: Self-pay | Admitting: Hematology & Oncology

## 2017-07-16 MED FILL — XARELTO 10 MG TABLET: 10 | 90 days supply | Qty: 90 | Fill #1

## 2017-07-27 ENCOUNTER — Other Ambulatory Visit: Payer: Self-pay | Admitting: *Deleted

## 2017-07-27 ENCOUNTER — Other Ambulatory Visit (HOSPITAL_BASED_OUTPATIENT_CLINIC_OR_DEPARTMENT_OTHER): Payer: 59

## 2017-07-27 ENCOUNTER — Ambulatory Visit (HOSPITAL_BASED_OUTPATIENT_CLINIC_OR_DEPARTMENT_OTHER): Payer: 59 | Admitting: Hematology & Oncology

## 2017-07-27 VITALS — BP 137/77 | HR 75 | Temp 97.9°F | Wt 185.0 lb

## 2017-07-27 DIAGNOSIS — D693 Immune thrombocytopenic purpura: Secondary | ICD-10-CM | POA: Diagnosis not present

## 2017-07-27 DIAGNOSIS — Z7901 Long term (current) use of anticoagulants: Secondary | ICD-10-CM | POA: Diagnosis not present

## 2017-07-27 DIAGNOSIS — Z86718 Personal history of other venous thrombosis and embolism: Secondary | ICD-10-CM

## 2017-07-27 DIAGNOSIS — Z86711 Personal history of pulmonary embolism: Secondary | ICD-10-CM | POA: Diagnosis not present

## 2017-07-27 DIAGNOSIS — I825Z3 Chronic embolism and thrombosis of unspecified deep veins of distal lower extremity, bilateral: Secondary | ICD-10-CM

## 2017-07-27 LAB — CBC WITH DIFFERENTIAL (CANCER CENTER ONLY)
BASO#: 0.1 10*3/uL (ref 0.0–0.2)
BASO%: 0.5 % (ref 0.0–2.0)
EOS ABS: 0.7 10*3/uL — AB (ref 0.0–0.5)
EOS%: 5.8 % (ref 0.0–7.0)
HEMATOCRIT: 39.9 % (ref 34.8–46.6)
HEMOGLOBIN: 13.8 g/dL (ref 11.6–15.9)
LYMPH#: 3.8 10*3/uL — AB (ref 0.9–3.3)
LYMPH%: 31.2 % (ref 14.0–48.0)
MCH: 31.8 pg (ref 26.0–34.0)
MCHC: 34.6 g/dL (ref 32.0–36.0)
MCV: 92 fL (ref 81–101)
MONO#: 0.9 10*3/uL (ref 0.1–0.9)
MONO%: 7 % (ref 0.0–13.0)
NEUT%: 55.5 % (ref 39.6–80.0)
NEUTROS ABS: 6.8 10*3/uL — AB (ref 1.5–6.5)
RBC: 4.34 10*6/uL (ref 3.70–5.32)
RDW: 13 % (ref 11.1–15.7)
WBC: 12.2 10*3/uL — AB (ref 3.9–10.0)

## 2017-07-27 LAB — CHCC SATELLITE - SMEAR

## 2017-07-27 MED ORDER — DEXAMETHASONE 4 MG PO TABS: 4.0000 mg | ORAL_TABLET | Freq: Every day | ORAL | 2 refills | Status: DC

## 2017-07-27 MED FILL — DEXAMETHASONE 4 MG TABLET: 4 | 30 days supply | Qty: 100 | Fill #0

## 2017-07-27 NOTE — Progress Notes (Signed)
Hematology and Oncology Follow Up Visit  Janice Martin [id number] 1961/02/09 56 y.o. 07/27/2017   Principle Diagnosis:   Chronic immune thrombocytopenia-recurrent  Submassive pulmonary embolism, with right lower extremity thrombus  Current Therapy:    Xarelto 10 mg by mouth daily   Decadron-taper per protocol for relapsed thrombocytopenia     Interim History:  Janice Martin is back for followup.  She is doing okay. She is under a lot of stress.   A lot of the stress is from work. 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  She had a pretty decent summer. She has had no problems with nausea or vomiting. There's been no change in bowel or bladder habits. She's had no cough. His been no shortness of breath. She's had no chest wall pain.  She's had no bleeding from the Xarelto.  Overall, her performance status is ECOG 0.  Medications:  Current Outpatient Prescriptions:  .  nebivolol (BYSTOLIC) 5 MG tablet, Take 1 tablet (5 mg total) by mouth daily. (Patient taking differently: Take 5 mg by mouth at bedtime. ), Disp: 90 tablet, Rfl: 1 .  Rivaroxaban (XARELTO) 10 MG TABS tablet, Take 1 tablet (10 mg total) by mouth daily with supper., Disp: 30 tablet, Rfl: 12 .  valsartan-hydrochlorothiazide (DIOVAN-HCT) 320-25 MG tablet, Take 1 tablet by mouth daily. (Patient taking differently: Take 1 tablet by mouth at bedtime. ), Disp: 90 tablet, Rfl: 1 .  vitamin B-12 (CYANOCOBALAMIN) 100 MCG tablet, Take 100 mcg by mouth at bedtime. , Disp: , Rfl:  .  dexamethasone (DECADRON) 4 MG tablet, Take 1 tablet (4 mg total) by mouth daily. Taper schedule for low platelets.  Take 5 pills a day for 4 days, then 3 pills a day for 2 days then 1 pill a day for 2 days., Disp: 100 tablet, Rfl: 2  Allergies:  Allergies  Allergen Reactions  . Bee Venom Anaphylaxis, Shortness Of Breath and Swelling  . Shellfish Allergy Anaphylaxis  . Influenza Vaccines     Per pt can't take  mess up with her immune system  . Morphine Other (See Comments)    Burns really bad    Past Medical History, Surgical history, Social history, and Family History were reviewed and updated.  Review of Systems:  As stated in the interim history  Physical Exam:  weight is 185 lb (83.9 kg). Her oral temperature is 97.9 F (36.6 C). Her blood pressure is 137/77 and her pulse is 75.    Well-developed and well-nourished white female. Head and neck exam shows no ocular or oral lesions. She has no palpable cervical or supraclavicular lymph nodes. Lungs are clear bilaterally. Cardiac exam regular rate and rhythm with no murmurs, rubs or bruits. Abdomen is soft. She has a splenectomy scar that is well-healed. She has no fluid wave. There is no palpable abdominal mass. There is no palpable hepatomegaly. Back exam shows no tenderness over the spine, ribs or hips. Extremities shows no clubbing, cyanosis or edema. Skin exam shows no rashes, ecchymoses or petechia. Neurological exam shows no focal neurological deficits.  Lab Results  Component Value Date   WBC 12.2 (H) 07/27/2017   HGB 13.8 07/27/2017   HCT 39.9 07/27/2017   MCV 92 07/27/2017   PLT 58 Platelet count consistent in citrate (L) 07/27/2017     Chemistry      Component Value Date/Time   NA 137 04/17/2017 1411   NA 138 02/13/2017 1152  K 3.4 04/17/2017 1411   K 3.7 02/13/2017 1152   CL 99 04/17/2017 1411   CO2 30 04/17/2017 1411   CO2 29 02/13/2017 1152   BUN 11 04/17/2017 1411   BUN 10.5 02/13/2017 1152   CREATININE 0.8 04/17/2017 1411   CREATININE 1.0 02/13/2017 1152      Component Value Date/Time   CALCIUM 9.3 04/17/2017 1411   CALCIUM 9.5 02/13/2017 1152   ALKPHOS 88 (H) 04/17/2017 1411   ALKPHOS 106 02/13/2017 1152   AST 19 04/17/2017 1411   AST 17 02/13/2017 1152   ALT 14 04/17/2017 1411   ALT 18 02/13/2017 1152   BILITOT 0.80 04/17/2017 1411   BILITOT 0.31 02/13/2017 1152       Impression and Plan: [name]  is a 56 year old white female. She has a history of relapsed chronic immune thrombocytopenia. We treated her with Rituxan. She responded very well. She got treated back in October of 2014.   It looks like her platelets are trying to drop again. When this happens, we just get her on Decadron. She is on a Decadron taper for probably about a week or so. This has worked well for her in the past. His been about a year since she was on Decadron.  I need to check her platelet count every 2 weeks for right now.  If we cannot get her platelet count of adequately, we can certainly try the new FDA approved drug for immune thrombocytopenia- Tavalisse.  I still want her on the Xarelto. I think this would be safe to take given her low platelets.  I want to see her back in 6 weeks. Again, we are checking her CBC every 2 weeks.  [name], MD 10/8/20185:45 PM

## 2017-07-30 ENCOUNTER — Encounter: Payer: Self-pay | Admitting: Hematology & Oncology

## 2017-08-04 MED FILL — VALSARTAN-HCTZ 320-25 MG TA: 320-25 | 30 days supply | Qty: 30 | Fill #2

## 2017-08-06 ENCOUNTER — Other Ambulatory Visit (HOSPITAL_BASED_OUTPATIENT_CLINIC_OR_DEPARTMENT_OTHER): Payer: 59

## 2017-08-06 DIAGNOSIS — D693 Immune thrombocytopenic purpura: Secondary | ICD-10-CM

## 2017-08-06 LAB — CBC WITH DIFFERENTIAL/PLATELET
BASO%: 0.2 % (ref 0.0–2.0)
Basophils Absolute: 0 10*3/uL (ref 0.0–0.1)
EOS ABS: 0.7 10*3/uL — AB (ref 0.0–0.5)
EOS%: 4 % (ref 0.0–7.0)
HCT: 39.7 % (ref 34.8–46.6)
HGB: 13.6 g/dL (ref 11.6–15.9)
LYMPH%: 37 % (ref 14.0–49.7)
MCH: 31.8 pg (ref 25.1–34.0)
MCHC: 34.3 g/dL (ref 31.5–36.0)
MCV: 92.8 fL (ref 79.5–101.0)
MONO#: 1.2 10*3/uL — ABNORMAL HIGH (ref 0.1–0.9)
MONO%: 7.6 % (ref 0.0–14.0)
NEUT%: 51.2 % (ref 38.4–76.8)
NEUTROS ABS: 8.3 10*3/uL — AB (ref 1.5–6.5)
NRBC: 0 % (ref 0–0)
RBC: 4.28 10*6/uL (ref 3.70–5.45)
RDW: 14.1 % (ref 11.2–14.5)
WBC: 16.3 10*3/uL — AB (ref 3.9–10.3)
lymph#: 6 10*3/uL — ABNORMAL HIGH (ref 0.9–3.3)

## 2017-08-20 ENCOUNTER — Other Ambulatory Visit (HOSPITAL_BASED_OUTPATIENT_CLINIC_OR_DEPARTMENT_OTHER): Payer: 59

## 2017-08-20 DIAGNOSIS — D693 Immune thrombocytopenic purpura: Secondary | ICD-10-CM | POA: Diagnosis not present

## 2017-08-20 LAB — CBC WITH DIFFERENTIAL/PLATELET
BASO%: 0.5 % (ref 0.0–2.0)
BASOS ABS: 0.1 10*3/uL (ref 0.0–0.1)
EOS%: 5.1 % (ref 0.0–7.0)
Eosinophils Absolute: 0.5 10*3/uL (ref 0.0–0.5)
HEMATOCRIT: 37.8 % (ref 34.8–46.6)
HEMOGLOBIN: 12.9 g/dL (ref 11.6–15.9)
LYMPH%: 42.1 % (ref 14.0–49.7)
MCH: 31.5 pg (ref 25.1–34.0)
MCHC: 34.1 g/dL (ref 31.5–36.0)
MCV: 92.4 fL (ref 79.5–101.0)
MONO#: 0.7 10*3/uL (ref 0.1–0.9)
MONO%: 7.1 % (ref 0.0–14.0)
NEUT#: 4.6 10*3/uL (ref 1.5–6.5)
NEUT%: 45.2 % (ref 38.4–76.8)
Platelets: 196 10*3/uL (ref 145–400)
RBC: 4.09 10*6/uL (ref 3.70–5.45)
RDW: 13.8 % (ref 11.2–14.5)
WBC: 10.1 10*3/uL (ref 3.9–10.3)
lymph#: 4.2 10*3/uL — ABNORMAL HIGH (ref 0.9–3.3)
nRBC: 0 % (ref 0–0)

## 2017-09-03 ENCOUNTER — Other Ambulatory Visit (HOSPITAL_BASED_OUTPATIENT_CLINIC_OR_DEPARTMENT_OTHER): Payer: 59

## 2017-09-03 DIAGNOSIS — D693 Immune thrombocytopenic purpura: Secondary | ICD-10-CM

## 2017-09-03 LAB — CBC WITH DIFFERENTIAL/PLATELET
BASO%: 1.3 % (ref 0.0–2.0)
Basophils Absolute: 0.1 10*3/uL (ref 0.0–0.1)
EOS%: 4.9 % (ref 0.0–7.0)
Eosinophils Absolute: 0.5 10*3/uL (ref 0.0–0.5)
HCT: 38.5 % (ref 34.8–46.6)
HEMOGLOBIN: 13 g/dL (ref 11.6–15.9)
LYMPH%: 36.6 % (ref 14.0–49.7)
MCH: 31.8 pg (ref 25.1–34.0)
MCHC: 33.9 g/dL (ref 31.5–36.0)
MCV: 93.9 fL (ref 79.5–101.0)
MONO#: 0.9 10*3/uL (ref 0.1–0.9)
MONO%: 8.5 % (ref 0.0–14.0)
NEUT#: 5.4 10*3/uL (ref 1.5–6.5)
NEUT%: 48.7 % (ref 38.4–76.8)
Platelets: 469 10*3/uL — ABNORMAL HIGH (ref 145–400)
RBC: 4.1 10*6/uL (ref 3.70–5.45)
RDW: 14.1 % (ref 11.2–14.5)
WBC: 11.2 10*3/uL — AB (ref 3.9–10.3)
lymph#: 4.1 10*3/uL — ABNORMAL HIGH (ref 0.9–3.3)

## 2017-09-04 ENCOUNTER — Other Ambulatory Visit: Payer: Self-pay | Admitting: *Deleted

## 2017-09-07 ENCOUNTER — Other Ambulatory Visit: Payer: Self-pay

## 2017-09-07 ENCOUNTER — Ambulatory Visit (HOSPITAL_BASED_OUTPATIENT_CLINIC_OR_DEPARTMENT_OTHER): Payer: 59 | Admitting: Hematology & Oncology

## 2017-09-07 VITALS — BP 132/68 | HR 81 | Temp 98.1°F | Resp 20 | Wt 183.2 lb

## 2017-09-07 DIAGNOSIS — Z86718 Personal history of other venous thrombosis and embolism: Secondary | ICD-10-CM | POA: Diagnosis not present

## 2017-09-07 DIAGNOSIS — I2692 Saddle embolus of pulmonary artery without acute cor pulmonale: Secondary | ICD-10-CM

## 2017-09-07 DIAGNOSIS — I2782 Chronic pulmonary embolism: Secondary | ICD-10-CM

## 2017-09-07 DIAGNOSIS — D693 Immune thrombocytopenic purpura: Secondary | ICD-10-CM

## 2017-09-07 DIAGNOSIS — Z7901 Long term (current) use of anticoagulants: Secondary | ICD-10-CM

## 2017-09-07 DIAGNOSIS — I825Z2 Chronic embolism and thrombosis of unspecified deep veins of left distal lower extremity: Secondary | ICD-10-CM

## 2017-09-07 DIAGNOSIS — Z86711 Personal history of pulmonary embolism: Secondary | ICD-10-CM

## 2017-09-07 NOTE — Progress Notes (Signed)
Hematology and Oncology Follow Up Visit  Janice Martin [id number] Nov 10, 1960 56 y.o. 09/07/2017   Principle Diagnosis:   Chronic immune thrombocytopenia-recurrent  Submassive pulmonary embolism, with right lower extremity thrombus  Current Therapy:    Xarelto 10 mg by mouth daily   Decadron-taper per protocol for relapsed thrombocytopenia     Interim History:  [name] is back for followup.  She looks pretty good.  She feels okay.  She is under a lot of stress from work.  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  She has been on Decadron.  Whenever she takes Decadron, her platelet count goes up quite nicely.  She has had no bleeding.  She is on Xarelto.  This is a long-term low-dose Xarelto.  She has had no cough or shortness of breath.  She has had no leg pain.  She has had no rashes.  Overall, her performance status is ECOG 1. Medications:  Current Outpatient Medications:  .  dexamethasone (DECADRON) 4 MG tablet, Take 1 tablet (4 mg total) by mouth daily. Taper schedule for low platelets.  Take 5 pills a day for 4 days, then 3 pills a day for 2 days then 1 pill a day for 2 days., Disp: 100 tablet, Rfl: 2 .  nebivolol (BYSTOLIC) 5 MG tablet, Take 1 tablet (5 mg total) by mouth daily. (Patient taking differently: Take 5 mg by mouth at bedtime. ), Disp: 90 tablet, Rfl: 1 .  Rivaroxaban (XARELTO) 10 MG TABS tablet, Take 1 tablet (10 mg total) by mouth daily with supper., Disp: 30 tablet, Rfl: 12 .  valsartan-hydrochlorothiazide (DIOVAN-HCT) 320-25 MG tablet, Take 1 tablet by mouth daily. (Patient taking differently: Take 1 tablet by mouth at bedtime. ), Disp: 90 tablet, Rfl: 1 .  vitamin B-12 (CYANOCOBALAMIN) 100 MCG tablet, Take 100 mcg by mouth at bedtime. , Disp: , Rfl:   Allergies:  Allergies  Allergen Reactions  . Bee Venom Anaphylaxis, Shortness Of Breath and Swelling  . Shellfish Allergy Anaphylaxis  . Influenza Vaccines     Per pt can't take mess up  with her immune system  . Morphine Other (See Comments)    Burns really bad    Past Medical History, Surgical history, Social history, and Family History were reviewed and updated.  Review of Systems:  As stated in the interim history  Physical Exam:  weight is 183 lb 4 oz (83.1 kg). Her oral temperature is 98.1 F (36.7 C). Her blood pressure is 132/68 and her pulse is 81. Her respiration is 20 and oxygen saturation is 100%.   I examined Janice Martin today.  The findings on my examination are noted below with appropriate changes:   Well-developed and well-nourished white female. Head and neck exam shows no ocular or oral lesions. She has no palpable cervical or supraclavicular lymph nodes.  Her thyroid is nonpalpable.  Lungs are clear bilaterally. Cardiac exam regular rate and rhythm with no murmurs, rubs or bruits. Abdomen is soft. She has a splenectomy scar that is well-healed. She has no fluid wave. There is no palpable abdominal mass. There is no palpable hepatomegaly. Back exam shows no tenderness over the spine, ribs or hips. Extremities shows no clubbing, cyanosis or edema. Skin exam shows no rashes, ecchymoses or petechia. Neurological exam shows no focal neurological deficits.  Lab Results  Component Value Date   WBC 11.2 (H) 09/03/2017   HGB 13.0 09/03/2017   HCT 38.5 09/03/2017   MCV 93.9  09/03/2017   PLT 469 Platelet count consistent in citrate (H) 09/03/2017     Chemistry      Component Value Date/Time   NA 137 04/17/2017 1411   NA 138 02/13/2017 1152   K 3.4 04/17/2017 1411   K 3.7 02/13/2017 1152   CL 99 04/17/2017 1411   CO2 30 04/17/2017 1411   CO2 29 02/13/2017 1152   BUN 11 04/17/2017 1411   BUN 10.5 02/13/2017 1152   CREATININE 0.8 04/17/2017 1411   CREATININE 1.0 02/13/2017 1152      Component Value Date/Time   CALCIUM 9.3 04/17/2017 1411   CALCIUM 9.5 02/13/2017 1152   ALKPHOS 88 (H) 04/17/2017 1411   ALKPHOS 106 02/13/2017 1152   AST 19 04/17/2017  1411   AST 17 02/13/2017 1152   ALT 14 04/17/2017 1411   ALT 18 02/13/2017 1152   BILITOT 0.80 04/17/2017 1411   BILITOT 0.31 02/13/2017 1152       Impression and Plan: Janice Martin is a 56 year old white female. She has a history of relapsed chronic immune thrombocytopenia. We treated her with Rituxan. She responded very well. She got treated back in October of 2014.  As expected, her platelet count has come up quite nicely.  She always knows what to do when her platelet count gets low.  She always knows when her platelet count is low.  We will plan to get her back to see [place] after the holidays.  She usually will call us if she is having any problems.   [name], MD 11/19/20185:40 PM

## 2017-09-09 MED FILL — VALSARTAN-HCTZ 320-25 MG TA: 320-25 | 30 days supply | Qty: 30 | Fill #3

## 2017-09-17 ENCOUNTER — Other Ambulatory Visit: Payer: Self-pay

## 2017-09-17 ENCOUNTER — Other Ambulatory Visit (HOSPITAL_BASED_OUTPATIENT_CLINIC_OR_DEPARTMENT_OTHER): Payer: 59

## 2017-09-17 DIAGNOSIS — D693 Immune thrombocytopenic purpura: Secondary | ICD-10-CM

## 2017-09-17 DIAGNOSIS — I2782 Chronic pulmonary embolism: Secondary | ICD-10-CM

## 2017-09-17 DIAGNOSIS — I2692 Saddle embolus of pulmonary artery without acute cor pulmonale: Secondary | ICD-10-CM

## 2017-09-17 DIAGNOSIS — I825Z2 Chronic embolism and thrombosis of unspecified deep veins of left distal lower extremity: Secondary | ICD-10-CM

## 2017-09-17 LAB — CBC WITH DIFFERENTIAL/PLATELET
BASO%: 1.1 % (ref 0.0–2.0)
Basophils Absolute: 0.1 10*3/uL (ref 0.0–0.1)
EOS ABS: 1.5 10*3/uL — AB (ref 0.0–0.5)
EOS%: 14.2 % — ABNORMAL HIGH (ref 0.0–7.0)
HEMATOCRIT: 37.7 % (ref 34.8–46.6)
HGB: 13 g/dL (ref 11.6–15.9)
LYMPH%: 39.7 % (ref 14.0–49.7)
MCH: 32.2 pg (ref 25.1–34.0)
MCHC: 34.5 g/dL (ref 31.5–36.0)
MCV: 93.3 fL (ref 79.5–101.0)
MONO#: 0.7 10*3/uL (ref 0.1–0.9)
MONO%: 6.9 % (ref 0.0–14.0)
NEUT%: 38.1 % — ABNORMAL LOW (ref 38.4–76.8)
NEUTROS ABS: 4 10*3/uL (ref 1.5–6.5)
NRBC: 0 % (ref 0–0)
RBC: 4.04 10*6/uL (ref 3.70–5.45)
RDW: 14 % (ref 11.2–14.5)
WBC: 10.4 10*3/uL — AB (ref 3.9–10.3)
lymph#: 4.1 10*3/uL — ABNORMAL HIGH (ref 0.9–3.3)

## 2017-09-17 LAB — COMPREHENSIVE METABOLIC PANEL
ALT: 18 U/L (ref 0–55)
ANION GAP: 9 meq/L (ref 3–11)
AST: 15 U/L (ref 5–34)
Albumin: 3.7 g/dL (ref 3.5–5.0)
Alkaline Phosphatase: 105 U/L (ref 40–150)
BILIRUBIN TOTAL: 0.38 mg/dL (ref 0.20–1.20)
BUN: 8.9 mg/dL (ref 7.0–26.0)
CHLORIDE: 101 meq/L (ref 98–109)
CO2: 30 meq/L — AB (ref 22–29)
CREATININE: 1.2 mg/dL — AB (ref 0.6–1.1)
Calcium: 9.3 mg/dL (ref 8.4–10.4)
EGFR: 50 mL/min/{1.73_m2} — ABNORMAL LOW (ref >60)
Glucose: 94 mg/dl (ref 70–140)
Potassium: 3.9 mEq/L (ref 3.5–5.1)
Sodium: 140 mEq/L (ref 136–145)
TOTAL PROTEIN: 7.2 g/dL (ref 6.4–8.3)

## 2017-09-17 LAB — CHCC SMEAR

## 2017-09-17 LAB — TECHNOLOGIST REVIEW

## 2017-10-26 ENCOUNTER — Other Ambulatory Visit: Payer: Self-pay

## 2017-10-26 ENCOUNTER — Inpatient Hospital Stay: Payer: 59 | Attending: Hematology & Oncology | Admitting: Hematology & Oncology

## 2017-10-26 ENCOUNTER — Inpatient Hospital Stay: Payer: 59

## 2017-10-26 ENCOUNTER — Encounter: Payer: Self-pay | Admitting: Hematology & Oncology

## 2017-10-26 VITALS — BP 155/84 | HR 80 | Temp 98.2°F | Resp 16 | Wt 183.0 lb

## 2017-10-26 DIAGNOSIS — I2699 Other pulmonary embolism without acute cor pulmonale: Secondary | ICD-10-CM | POA: Insufficient documentation

## 2017-10-26 DIAGNOSIS — D693 Immune thrombocytopenic purpura: Secondary | ICD-10-CM | POA: Diagnosis not present

## 2017-10-26 DIAGNOSIS — Z7901 Long term (current) use of anticoagulants: Secondary | ICD-10-CM | POA: Insufficient documentation

## 2017-10-26 LAB — CBC WITH DIFFERENTIAL (CANCER CENTER ONLY)
Abs Granulocyte: 5.6 10*3/uL (ref 1.5–6.5)
BASOS ABS: 0.1 10*3/uL (ref 0.0–0.1)
Basophils Relative: 1 %
Eosinophils Absolute: 1.4 10*3/uL — ABNORMAL HIGH (ref 0.0–0.5)
Eosinophils Relative: 11 %
HCT: 38.3 % (ref 34.8–46.6)
HEMOGLOBIN: 13.3 g/dL (ref 11.6–15.9)
LYMPHS PCT: 37 %
Lymphs Abs: 4.8 10*3/uL — ABNORMAL HIGH (ref 0.9–3.3)
MCH: 32.2 pg (ref 26.0–34.0)
MCHC: 34.7 g/dL (ref 32.0–36.0)
MCV: 92.7 fL (ref 81.0–101.0)
Monocytes Absolute: 1.1 10*3/uL — ABNORMAL HIGH (ref 0.1–0.9)
Monocytes Relative: 8 %
NEUTROS ABS: 5.6 10*3/uL (ref 1.5–6.5)
NEUTROS PCT: 43 %
PLATELETS: 146 10*3/uL (ref 145–400)
RBC: 4.13 MIL/uL (ref 3.70–5.32)
RDW: 13.3 % (ref 11.1–15.7)
WBC: 12.9 10*3/uL — AB (ref 4.0–10.3)

## 2017-10-26 NOTE — Progress Notes (Signed)
Hematology and Oncology Follow Up Visit  Janice Martin [id number] [birth year] 57 y.o. 10/26/2017   Principle Diagnosis:   Chronic immune thrombocytopenia-recurrent  Submassive pulmonary embolism, with right lower extremity thrombus  Current Therapy:    Xarelto 10 mg by mouth daily   Decadron-taper per protocol for relapsed thrombocytopenia     Interim History:  [name] is back for followup.  She is doing okay.  She is under a lot of stress.  She and Janice husband are moving in.  They just cannot live with Janice Martin.  She does have so many issues to deal with.  She also is under stress at work.  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  As always, there are changes that are going on.  She has had no problems with nausea or vomiting.  She has had no problems with fever.  She has had no bleeding.  She has had no cough or shortness of breath.  She has had no leg swelling.   Overall, Janice performance status is ECOG 1.  Medications:  Current Outpatient Medications:  .  dexamethasone (DECADRON) 4 MG tablet, Take 1 tablet (4 mg total) by mouth daily. Taper schedule for low platelets.  Take 5 pills a day for 4 days, then 3 pills a day for 2 days then 1 pill a day for 2 days., Disp: 100 tablet, Rfl: 2 .  nebivolol (BYSTOLIC) 5 MG tablet, Take 1 tablet (5 mg total) by mouth daily. (Patient taking differently: Take 5 mg by mouth at bedtime. ), Disp: 90 tablet, Rfl: 1 .  Rivaroxaban (XARELTO) 10 MG TABS tablet, Take 1 tablet (10 mg total) by mouth daily with supper., Disp: 30 tablet, Rfl: 12 .  valsartan-hydrochlorothiazide (DIOVAN-HCT) 320-25 MG tablet, Take 1 tablet by mouth daily. (Patient taking differently: Take 1 tablet by mouth at bedtime. ), Disp: 90 tablet, Rfl: 1 .  vitamin B-12 (CYANOCOBALAMIN) 100 MCG tablet, Take 100 mcg by mouth at bedtime. , Disp: , Rfl:   Allergies:  Allergies  Allergen Reactions  . Bee Venom Anaphylaxis, Shortness Of Breath and Swelling  .  Shellfish Allergy Anaphylaxis  . Influenza Vaccines     Per pt can't take mess up with Janice immune system  . Morphine Other (See Comments)    Burns really bad    Past Medical History, Surgical history, Social history, and Family History were reviewed and updated.  Review of Systems: Review of Systems  Constitutional: Negative.   HENT: Negative.   Eyes: Negative.   Respiratory: Negative.   Cardiovascular: Negative.   Gastrointestinal: Negative.   Genitourinary: Negative.   Musculoskeletal: Negative.   Skin: Negative.   Neurological: Negative.   Endo/Heme/Allergies: Negative.   Psychiatric/Behavioral: Negative.     Physical Exam:  weight is 183 lb (83 kg). Janice oral temperature is 98.2 F (36.8 C). Janice blood pressure is 155/84 (abnormal) and Janice pulse is 80. Janice respiration is 16 and oxygen saturation is 99%.   Physical Exam  Constitutional: She is oriented to person, place, and time.  HENT:  Head: Normocephalic and atraumatic.  Mouth/Throat: Oropharynx is clear and moist.  Eyes: EOM are normal. Pupils are equal, round, and reactive to light.  Neck: Normal range of motion.  Cardiovascular: Normal rate, regular rhythm and normal heart sounds.  Pulmonary/Chest: Effort normal and breath sounds normal.  Abdominal: Soft. Bowel sounds are normal.  She has well-healed splenectomy scar.  No fluid wave.  No palpable liver.  No guarding or rebound tenderness.  Musculoskeletal: Normal range of motion. She exhibits no edema, tenderness or deformity.  Lymphadenopathy:    She has no cervical adenopathy.  Neurological: She is alert and oriented to person, place, and time.  Skin: Skin is warm and dry. No rash noted. No erythema.  Psychiatric: She has a normal mood and affect. Janice behavior is normal. Judgment and thought content normal.  Vitals reviewed.   Lab Results  Component Value Date   WBC 10.4 (H) 09/17/2017   HGB 13.0 09/17/2017   HCT 38.3 10/26/2017   MCV 92.7 10/26/2017    PLT 205 Platelet count consistent in citrate 09/17/2017     Chemistry      Component Value Date/Time   NA 140 09/17/2017 1507   K 3.9 09/17/2017 1507   CL 99 04/17/2017 1411   CO2 30 (H) 09/17/2017 1507   BUN 8.9 09/17/2017 1507   CREATININE 1.2 (H) 09/17/2017 1507      Component Value Date/Time   CALCIUM 9.3 09/17/2017 1507   ALKPHOS 105 09/17/2017 1507   AST 15 09/17/2017 1507   ALT 18 09/17/2017 1507   BILITOT 0.38 09/17/2017 1507       Impression and Plan: Janice Martin is a 57 year old white female. white female. She has a history of relapsed chronic immune thrombocytopenia. We treated Janice with Rituxan. She responded very well. She got treated back in October of 2014.  Janice platelet count is trending downward again.  We will have to watch this.  As always, it does come back up when she restarts steroids.  Decadron always seem to work for Janice.  We will get Janice back in 1 month.  She knows to call us sooner if she has any issues with bleeding or bruising.    [name], MD 1/7/20194:13 PM

## 2017-11-09 ENCOUNTER — Other Ambulatory Visit: Payer: Self-pay | Admitting: Internal Medicine

## 2017-11-09 MED FILL — BYSTOLIC 5 MG TABLET: 5 | 90 days supply | Qty: 90 | Fill #0

## 2017-11-09 MED FILL — VALSARTAN-HCTZ 320-25 MG TA: 320-25 | 90 days supply | Qty: 90 | Fill #0

## 2017-11-10 ENCOUNTER — Other Ambulatory Visit: Payer: Self-pay | Admitting: *Deleted

## 2017-11-10 ENCOUNTER — Inpatient Hospital Stay: Payer: 59

## 2017-11-10 ENCOUNTER — Telehealth: Payer: Self-pay | Admitting: *Deleted

## 2017-11-10 ENCOUNTER — Encounter: Payer: Self-pay | Admitting: Hematology & Oncology

## 2017-11-10 DIAGNOSIS — D693 Immune thrombocytopenic purpura: Secondary | ICD-10-CM

## 2017-11-10 DIAGNOSIS — I2699 Other pulmonary embolism without acute cor pulmonale: Secondary | ICD-10-CM | POA: Diagnosis not present

## 2017-11-10 DIAGNOSIS — Z7901 Long term (current) use of anticoagulants: Secondary | ICD-10-CM | POA: Diagnosis not present

## 2017-11-10 LAB — COMPREHENSIVE METABOLIC PANEL
ALBUMIN: 3.5 g/dL (ref 3.5–5.0)
ALT: 23 U/L (ref 0–55)
AST: 21 U/L (ref 5–34)
Alkaline Phosphatase: 81 U/L (ref 40–150)
Anion gap: 8 (ref 3–11)
BUN: 10 mg/dL (ref 7–26)
CHLORIDE: 101 mmol/L (ref 98–109)
CO2: 31 mmol/L — ABNORMAL HIGH (ref 22–29)
Calcium: 8.8 mg/dL (ref 8.4–10.4)
Creatinine, Ser: 1 mg/dL (ref 0.60–1.10)
GFR calc Af Amer: >60 mL/min (ref >60)
GFR calc non Af Amer: >60 mL/min (ref >60)
GLUCOSE: 97 mg/dL (ref 70–140)
POTASSIUM: 3.5 mmol/L (ref 3.3–4.7)
Sodium: 140 mmol/L (ref 136–145)
Total Bilirubin: 0.4 mg/dL (ref 0.2–1.2)
Total Protein: 6.8 g/dL (ref 6.4–8.3)

## 2017-11-10 LAB — CBC WITH DIFFERENTIAL (CANCER CENTER ONLY)
Basophils Absolute: 0.1 10*3/uL (ref 0.0–0.1)
Basophils Relative: 1 %
EOS PCT: 6 %
Eosinophils Absolute: 0.4 10*3/uL (ref 0.0–0.5)
HCT: 38.9 % (ref 34.8–46.6)
Hemoglobin: 13.3 g/dL (ref 11.6–15.9)
LYMPHS ABS: 2.1 10*3/uL (ref 0.9–3.3)
LYMPHS PCT: 26 %
MCH: 31.7 pg (ref 25.1–34.0)
MCHC: 34.2 g/dL (ref 31.5–36.0)
MCV: 92.6 fL (ref 79.5–101.0)
MONO ABS: 0.9 10*3/uL (ref 0.1–0.9)
MONOS PCT: 11 %
Neutro Abs: 4.6 10*3/uL (ref 1.5–6.5)
Neutrophils Relative %: 56 %
PLATELETS: 1 10*3/uL — AB (ref 145–400)
RBC: 4.2 MIL/uL (ref 3.70–5.45)
RDW: 13.8 % (ref 11.2–16.1)
WBC Count: 8 10*3/uL (ref 3.9–10.3)

## 2017-11-10 MED ORDER — DEXAMETHASONE 4 MG PO TABS: ORAL_TABLET | ORAL | 2 refills | Status: DC

## 2017-11-10 MED FILL — DEXAMETHASONE 4 MG TABLET: 4 | 42 days supply | Qty: 84 | Fill #0

## 2017-11-10 NOTE — Telephone Encounter (Signed)
Critical Value Platelets 1 Dr Marin Olp notified. Patient is to start her Decadron protocol. Labs need to be rechecked on Friday.   Patient is aware of protocol and has already started. She is also aware of need for repeat labs. Message sent to scheduler.

## 2017-11-11 ENCOUNTER — Other Ambulatory Visit: Payer: Self-pay | Admitting: *Deleted

## 2017-11-11 ENCOUNTER — Encounter: Payer: Self-pay | Admitting: Hematology & Oncology

## 2017-11-11 DIAGNOSIS — D693 Immune thrombocytopenic purpura: Secondary | ICD-10-CM

## 2017-11-13 ENCOUNTER — Inpatient Hospital Stay: Payer: 59

## 2017-11-13 DIAGNOSIS — I2699 Other pulmonary embolism without acute cor pulmonale: Secondary | ICD-10-CM | POA: Diagnosis not present

## 2017-11-13 DIAGNOSIS — D693 Immune thrombocytopenic purpura: Secondary | ICD-10-CM | POA: Diagnosis not present

## 2017-11-13 DIAGNOSIS — Z7901 Long term (current) use of anticoagulants: Secondary | ICD-10-CM | POA: Diagnosis not present

## 2017-11-13 LAB — CBC WITH DIFFERENTIAL (CANCER CENTER ONLY)
BASOS ABS: 0.1 10*3/uL (ref 0.0–0.1)
BASOS PCT: 0 %
EOS ABS: 0 10*3/uL (ref 0.0–0.5)
Eosinophils Relative: 0 %
HEMATOCRIT: 38.3 % (ref 34.8–46.6)
HEMOGLOBIN: 12.8 g/dL (ref 11.6–15.9)
Lymphocytes Relative: 24 %
Lymphs Abs: 4.6 10*3/uL — ABNORMAL HIGH (ref 0.9–3.3)
MCH: 31.1 pg (ref 25.1–34.0)
MCHC: 33.5 g/dL (ref 31.5–36.0)
MCV: 92.8 fL (ref 79.5–101.0)
MONOS PCT: 8 %
Monocytes Absolute: 1.5 10*3/uL — ABNORMAL HIGH (ref 0.1–0.9)
NEUTROS ABS: 12.7 10*3/uL — AB (ref 1.5–6.5)
NEUTROS PCT: 68 %
Platelet Count: 141 10*3/uL — ABNORMAL LOW (ref 145–400)
RBC: 4.12 MIL/uL (ref 3.70–5.45)
RDW: 13.6 % (ref 11.2–16.1)
WBC: 18.9 10*3/uL — AB (ref 3.9–10.3)

## 2017-11-14 LAB — ANTI-DNA ANTIBODY, DOUBLE-STRANDED: DS DNA AB: 1 [IU]/mL (ref 0–9)

## 2017-11-16 ENCOUNTER — Telehealth: Payer: Self-pay | Admitting: *Deleted

## 2017-11-16 LAB — ANTINUCLEAR ANTIBODIES, IFA: ANTINUCLEAR ANTIBODIES, IFA: NEGATIVE

## 2017-11-16 NOTE — Telephone Encounter (Addendum)
Patient is aware of results  ----- Message from [name], MD sent at 11/16/2017  9:16 AM EST ----- Cal - test for lupus is negative.  pete

## 2017-11-27 ENCOUNTER — Inpatient Hospital Stay: Payer: 59

## 2017-11-27 ENCOUNTER — Inpatient Hospital Stay: Payer: 59 | Attending: Hematology & Oncology | Admitting: Hematology & Oncology

## 2017-11-27 ENCOUNTER — Other Ambulatory Visit: Payer: Self-pay

## 2017-11-27 VITALS — BP 154/77 | HR 84 | Temp 98.5°F | Resp 16 | Wt 185.0 lb

## 2017-11-27 DIAGNOSIS — D693 Immune thrombocytopenic purpura: Secondary | ICD-10-CM

## 2017-11-27 DIAGNOSIS — I2699 Other pulmonary embolism without acute cor pulmonale: Secondary | ICD-10-CM | POA: Diagnosis not present

## 2017-11-27 DIAGNOSIS — Z7901 Long term (current) use of anticoagulants: Secondary | ICD-10-CM | POA: Diagnosis not present

## 2017-11-27 LAB — CBC WITH DIFFERENTIAL (CANCER CENTER ONLY)
BASOS ABS: 0.1 10*3/uL (ref 0.0–0.1)
BASOS PCT: 1 %
EOS ABS: 0.4 10*3/uL (ref 0.0–0.5)
EOS PCT: 4 %
HCT: 37.7 % (ref 34.8–46.6)
Hemoglobin: 12.7 g/dL (ref 11.6–15.9)
LYMPHS PCT: 44 %
Lymphs Abs: 4.2 10*3/uL — ABNORMAL HIGH (ref 0.9–3.3)
MCH: 32.2 pg (ref 26.0–34.0)
MCHC: 33.7 g/dL (ref 32.0–36.0)
MCV: 95.7 fL (ref 81.0–101.0)
Monocytes Absolute: 1.3 10*3/uL — ABNORMAL HIGH (ref 0.1–0.9)
Monocytes Relative: 13 %
Neutro Abs: 3.5 10*3/uL (ref 1.5–6.5)
Neutrophils Relative %: 38 %
PLATELETS: 207 10*3/uL (ref 145–400)
RBC: 3.94 MIL/uL (ref 3.70–5.32)
RDW: 14 % (ref 11.1–15.7)
WBC: 9.4 10*3/uL (ref 3.9–10.0)

## 2017-11-27 LAB — CMP (CANCER CENTER ONLY)
ALK PHOS: 122 U/L — AB (ref 26–84)
ALT: 26 U/L (ref 0–55)
AST: 20 U/L (ref 5–34)
Albumin: 3.4 g/dL — ABNORMAL LOW (ref 3.5–5.0)
Anion gap: 8 (ref 5–15)
BUN: 9 mg/dL (ref 7–22)
CALCIUM: 9.7 mg/dL (ref 8.0–10.3)
CO2: 32 mmol/L (ref 18–33)
Chloride: 105 mmol/L (ref 98–108)
Creatinine: 0.9 mg/dL (ref 0.60–1.10)
Glucose, Bld: 95 mg/dL (ref 73–118)
POTASSIUM: 3.7 mmol/L (ref 3.3–4.7)
Sodium: 145 mmol/L (ref 128–145)
TOTAL PROTEIN: 7.1 g/dL (ref 6.4–8.1)
Total Bilirubin: 0.5 mg/dL (ref 0.2–1.2)

## 2017-11-27 NOTE — Progress Notes (Signed)
Hematology and Oncology Follow Up Visit  Janice Martin [id number] 21-Jul-1961 57 y.o. 11/27/2017   Principle Diagnosis:   Chronic immune thrombocytopenia-recurrent  Submassive pulmonary embolism, with right lower extremity thrombus  Current Therapy:    Xarelto 10 mg by mouth daily   Decadron-taper per protocol for relapsed thrombocytopenia     Interim History:  Janice Martin is back for followup.  She comes in with her husband.  She is doing quite well.  She is now off Decadron.  She had a relapse of the ITP back in January.  Her platelet count was 1000.  She got back on her Decadron.  She was response to Decadron.  3 days later, her platelet count was up to 141,000.  She was exposed to the flu.  It is possible that this may have been the trigger that caused the relapse of her ITP.  She has had no bleeding.  She has had no mouth sores.  She has had no issues with thrush.  There is been no nausea or vomiting.  She has had no leg swelling.  She is under a lot of stress at work.  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   She has had no fever.  She is had no blurred vision.  There is been no headaches.    Overall, her performance status is ECOG 1.  Medications:  Current Outpatient Medications:  .  BYSTOLIC 5 MG tablet, TAKE 1 TABLET (5 MG TOTAL) BY MOUTH DAILY., Disp: 90 tablet, Rfl: 1 .  Rivaroxaban (XARELTO) 10 MG TABS tablet, Take 1 tablet (10 mg total) by mouth daily with supper., Disp: 30 tablet, Rfl: 12 .  valsartan-hydrochlorothiazide (DIOVAN-HCT) 320-25 MG tablet, Take 1 tablet by mouth daily. (Patient taking differently: Take 1 tablet by mouth at bedtime. ), Disp: 90 tablet, Rfl: 1 .  vitamin B-12 (CYANOCOBALAMIN) 100 MCG tablet, Take 100 mcg by mouth at bedtime. , Disp: , Rfl:   Allergies:  Allergies  Allergen Reactions  . Bee Venom Anaphylaxis, Shortness Of Breath and Swelling  . Shellfish Allergy Anaphylaxis  . Influenza Vaccines     Per pt can't  take mess up with her immune system  . Morphine Other (See Comments)    Burns really bad    Past Medical History, Surgical history, Social history, and Family History were reviewed and updated.  Review of Systems: Review of Systems  Constitutional: Negative.   HENT: Negative.   Eyes: Negative.   Respiratory: Negative.   Cardiovascular: Negative.   Gastrointestinal: Negative.   Genitourinary: Negative.   Musculoskeletal: Negative.   Skin: Negative.   Neurological: Negative.   Endo/Heme/Allergies: Negative.   Psychiatric/Behavioral: Negative.     Physical Exam:  weight is 185 lb (83.9 kg). Her oral temperature is 98.5 F (36.9 C). Her blood pressure is 154/77 (abnormal) and her pulse is 84. Her respiration is 16 and oxygen saturation is 99%.   Physical Exam  Constitutional: She is oriented to person, place, and time.  HENT:  Head: Normocephalic and atraumatic.  Mouth/Throat: Oropharynx is clear and moist.  Eyes: EOM are normal. Pupils are equal, round, and reactive to light.  Neck: Normal range of motion.  Cardiovascular: Normal rate, regular rhythm and normal heart sounds.  Pulmonary/Chest: Effort normal and breath sounds normal.  Abdominal: Soft. Bowel sounds are normal.  She has well-healed splenectomy scar.  No fluid wave.  No palpable liver.  No guarding or rebound tenderness.  Musculoskeletal: Normal range  of motion. She exhibits no edema, tenderness or deformity.  Lymphadenopathy:    She has no cervical adenopathy.  Neurological: She is alert and oriented to person, place, and time.  Skin: Skin is warm and dry. No rash noted. No erythema.  Psychiatric: She has a normal mood and affect. Her behavior is normal. Judgment and thought content normal.  Vitals reviewed.   Lab Results  Component Value Date   WBC 9.4 11/27/2017   HGB 13.0 09/17/2017   HCT 37.7 11/27/2017   MCV 95.7 11/27/2017   PLT 207 11/27/2017     Chemistry      Component Value Date/Time   NA  145 11/27/2017 1500   NA 140 09/17/2017 1507   K 3.7 11/27/2017 1500   K 3.9 09/17/2017 1507   CL 105 11/27/2017 1500   CL 99 04/17/2017 1411   CO2 32 11/27/2017 1500   CO2 30 (H) 09/17/2017 1507   BUN 9 11/27/2017 1500   BUN 8.9 09/17/2017 1507   CREATININE 0.90 11/27/2017 1500   CREATININE 1.2 (H) 09/17/2017 1507      Component Value Date/Time   CALCIUM 9.7 11/27/2017 1500   CALCIUM 9.3 09/17/2017 1507   ALKPHOS 122 (H) 11/27/2017 1500   ALKPHOS 105 09/17/2017 1507   AST 20 11/27/2017 1500   AST 15 09/17/2017 1507   ALT 26 11/27/2017 1500   ALT 18 09/17/2017 1507   BILITOT 0.5 11/27/2017 1500   BILITOT 0.38 09/17/2017 1507       Impression and Plan: Janice Martin is a 57 year old white female. She has a history of relapsed chronic immune thrombocytopenia.   Typically, she has a relapse every couple years.  At this point, we will just watch her blood counts as needed.  She will have her blood work done if she feels that her platelet count is dropping.  I will plan to have her come back to see [place] in another 4 months.  I am just happy that she is still responding to Decadron rechallenged.    [name], MD 2/8/20194:27 PM

## 2017-11-30 MED FILL — XARELTO 10 MG TABLET: 10 | 90 days supply | Qty: 90 | Fill #2

## 2017-12-29 ENCOUNTER — Inpatient Hospital Stay: Payer: 59 | Attending: Hematology & Oncology

## 2017-12-29 ENCOUNTER — Telehealth: Payer: Self-pay

## 2017-12-29 DIAGNOSIS — D693 Immune thrombocytopenic purpura: Secondary | ICD-10-CM | POA: Diagnosis not present

## 2017-12-29 LAB — CBC WITH DIFFERENTIAL (CANCER CENTER ONLY)
BASOS PCT: 0 %
Basophils Absolute: 0.1 10*3/uL (ref 0.0–0.1)
Eosinophils Absolute: 2.2 10*3/uL — ABNORMAL HIGH (ref 0.0–0.5)
Eosinophils Relative: 14 %
HEMATOCRIT: 40.2 % (ref 34.8–46.6)
HEMOGLOBIN: 13.8 g/dL (ref 11.6–15.9)
LYMPHS ABS: 3.8 10*3/uL — AB (ref 0.9–3.3)
Lymphocytes Relative: 23 %
MCH: 32.2 pg (ref 25.1–34.0)
MCHC: 34.3 g/dL (ref 31.5–36.0)
MCV: 93.9 fL (ref 79.5–101.0)
MONOS PCT: 7 %
Monocytes Absolute: 1.1 10*3/uL — ABNORMAL HIGH (ref 0.1–0.9)
NEUTROS ABS: 9.1 10*3/uL — AB (ref 1.5–6.5)
NEUTROS PCT: 56 %
Platelet Count: 38 10*3/uL — ABNORMAL LOW (ref 145–400)
RBC: 4.28 MIL/uL (ref 3.70–5.45)
RDW: 14 % (ref 11.2–14.5)
WBC Count: 16.3 10*3/uL — ABNORMAL HIGH (ref 3.9–10.3)

## 2017-12-29 LAB — CMP (CANCER CENTER ONLY)
ALT: 27 U/L (ref 0–55)
ANION GAP: 8 (ref 3–11)
AST: 19 U/L (ref 5–34)
Albumin: 3.8 g/dL (ref 3.5–5.0)
Alkaline Phosphatase: 119 U/L (ref 40–150)
BUN: 8 mg/dL (ref 7–26)
CHLORIDE: 99 mmol/L (ref 98–109)
CO2: 31 mmol/L — AB (ref 22–29)
Calcium: 9.6 mg/dL (ref 8.4–10.4)
Creatinine: 1.19 mg/dL — ABNORMAL HIGH (ref 0.60–1.10)
GFR, EST AFRICAN AMERICAN: 58 mL/min — AB (ref >60)
GFR, Estimated: 50 mL/min — ABNORMAL LOW (ref >60)
Glucose, Bld: 100 mg/dL (ref 70–140)
Potassium: 3.7 mmol/L (ref 3.5–5.1)
SODIUM: 138 mmol/L (ref 136–145)
Total Bilirubin: 0.4 mg/dL (ref 0.2–1.2)
Total Protein: 7.5 g/dL (ref 6.4–8.3)

## 2017-12-29 NOTE — Telephone Encounter (Signed)
Received call from pt stating that she noticed petechiae returning this am. Questions if she should have labs drawn.  CBC ordered per Dr Marin Olp.   Pt notified of platelets of 38. Aware to begin steroids as previously prescribed by Dr Marin Olp. dph

## 2017-12-31 ENCOUNTER — Other Ambulatory Visit: Payer: Self-pay | Admitting: *Deleted

## 2017-12-31 DIAGNOSIS — D693 Immune thrombocytopenic purpura: Secondary | ICD-10-CM

## 2018-01-01 ENCOUNTER — Inpatient Hospital Stay: Payer: 59

## 2018-01-01 DIAGNOSIS — D693 Immune thrombocytopenic purpura: Secondary | ICD-10-CM | POA: Diagnosis not present

## 2018-01-01 LAB — CBC WITH DIFFERENTIAL (CANCER CENTER ONLY)
Basophils Absolute: 0 10*3/uL (ref 0.0–0.1)
Basophils Relative: 0 %
EOS ABS: 0 10*3/uL (ref 0.0–0.5)
Eosinophils Relative: 0 %
HEMATOCRIT: 39.2 % (ref 34.8–46.6)
Hemoglobin: 13.3 g/dL (ref 11.6–15.9)
Lymphocytes Relative: 8 %
Lymphs Abs: 1.5 10*3/uL (ref 0.9–3.3)
MCH: 31.8 pg (ref 25.1–34.0)
MCHC: 33.9 g/dL (ref 31.5–36.0)
MCV: 93.8 fL (ref 79.5–101.0)
MONO ABS: 0.5 10*3/uL (ref 0.1–0.9)
MONOS PCT: 2 %
NEUTROS ABS: 17.6 10*3/uL — AB (ref 1.5–6.5)
Neutrophils Relative %: 90 %
Platelet Count: 199 10*3/uL (ref 145–400)
RBC: 4.18 MIL/uL (ref 3.70–5.45)
RDW: 14.1 % (ref 11.2–14.5)
WBC Count: 19.7 10*3/uL — ABNORMAL HIGH (ref 3.9–10.3)

## 2018-02-25 NOTE — Progress Notes (Signed)
Subjective:    Patient ID: Janice Martin, female    DOB: May 18, 1961, 57 y.o.   MRN: [id number]  HPI She is here for a physical exam.   She had to go back on the decadron for her platelets.  She follows with Dr Marin Olp.  Her platelets responded well.  She continues to take the blood thinner for her history of DVT.  She has no concerns.  She has been exercising some and has lost some weight.  Medications and allergies reviewed with patient and updated if appropriate.  Patient Active Problem List   Diagnosis Date Noted  . Hyperglycemia 10/18/2015  . Chronic deep vein thrombosis (DVT) of distal vein of lower extremity ([place]) 04/04/2015  . Severe thrombocytopenia ([place]) 03/25/2015  . Saddle embolus of pulmonary artery ([place]) 03/24/2015  . IMMUNE THROMBOCYTOPENIC PURPURA 05/10/2010  . Essential hypertension 05/10/2010    Current Outpatient Medications on File Prior to Visit  Medication Sig Dispense Refill  . BYSTOLIC 5 MG tablet TAKE 1 TABLET (5 MG TOTAL) BY MOUTH DAILY. 90 tablet 1  . Rivaroxaban (XARELTO) 10 MG TABS tablet Take 1 tablet (10 mg total) by mouth daily with supper. 30 tablet 12  . valsartan-hydrochlorothiazide (DIOVAN-HCT) 320-25 MG tablet Take 1 tablet by mouth daily. (Patient taking differently: Take 1 tablet by mouth at bedtime. ) 90 tablet 1  . vitamin B-12 (CYANOCOBALAMIN) 100 MCG tablet Take 100 mcg by mouth at bedtime.      No current facility-administered medications on file prior to visit.     Past Medical History:  Diagnosis Date  . ANEMIA-IRON DEFICIENCY   . Anxiety state, unspecified   . DVT of leg (deep venous thrombosis) ([place]) 04/04/2015  . HYPERTENSION   . Immune thrombocytopenic purpura ([place]) 2003 dx   s/p rituxan 06/2012 - now remission  . LEUKOCYTOSIS UNSPECIFIED   . Pulmonary embolism [facility]) June 2016   chronic anticoag due to high risk recurrence    Past Surgical History:  Procedure Laterality Date  . CESAREAN SECTION     x's 2  .  CHOLECYSTECTOMY    . Spleenectomy  1985 or 1986   due to ITP    Social History   Socioeconomic History  . Marital status: Married    Spouse name: Not on file  . Number of children: Not on file  . Years of education: Not on file  . Highest education level: Not on file  Occupational History  . Not on file  Social Needs  . Financial resource strain: Not on file  . Food insecurity:    Worry: Not on file    Inability: Not on file  . Transportation needs:    Medical: Not on file    Non-medical: Not on file  Tobacco Use  . Smoking status: Never Smoker  . Smokeless tobacco: Never Used  . Tobacco comment: never used product  Substance and Sexual Activity  . Alcohol use: No    Alcohol/week: 0.0 oz  . Drug use: No  . Sexual activity: Not on file  Lifestyle  . Physical activity:    Days per week: Not on file    Minutes per session: Not on file  . Stress: Not on file  Relationships  . Social connections:    Talks on phone: Not on file    Gets together: Not on file    Attends religious service: Not on file    Active member of club or organization: Not on file  Attends meetings of clubs or organizations: Not on file    Relationship status: Not on file  Other Topics Concern  . Not on file  Social History Narrative  . Not on file    Family History  Problem Relation Age of Onset  . Arthritis Father   . Mental illness Father        severe depression  . Arthritis Mother   . Diabetes Mother   . Hypertension Other        Parent  . Heart disease Other        Grandparent  . Stroke Other        grandparent  . Multiple sclerosis Sister     Review of Systems  Constitutional: Negative for chills, fatigue and fever.  Eyes: Negative for visual disturbance.  Respiratory: Negative for cough, shortness of breath and wheezing.   Cardiovascular: Negative for chest pain, palpitations and leg swelling.  Gastrointestinal: Negative for anal bleeding, blood in stool, constipation,  diarrhea and nausea.       No gerd  Genitourinary: Negative for dysuria and hematuria.  Musculoskeletal: Negative for arthralgias and back pain.  Skin: Negative for color change and rash.  Neurological: Negative for dizziness, light-headedness and headaches.  Psychiatric/Behavioral: Negative for dysphoric mood. The patient is not nervous/anxious.        Objective:   Vitals:   02/26/18 1530  BP: 134/88  Pulse: 84  Resp: 16  Temp: 98.1 F (36.7 C)  SpO2: 97%   Filed Weights   02/26/18 1530  Weight: 179 lb (81.2 kg)   Body mass index is 30.73 kg/m.  BP Readings from Last 3 Encounters:  02/26/18 134/88  11/27/17 (!) 154/77  10/26/17 (!) 155/84    Wt Readings from Last 3 Encounters:  02/26/18 179 lb (81.2 kg)  11/27/17 185 lb (83.9 kg)  10/26/17 183 lb (83 kg)     Physical Exam Constitutional: She appears well-developed and well-nourished. No distress.  HENT:  Head: Normocephalic and atraumatic.  Right Ear: External ear normal. Normal ear canal and TM Left Ear: External ear normal.  Normal ear canal and TM Mouth/Throat: Oropharynx is clear and moist.  Eyes: Conjunctivae and EOM are normal.  Neck: Neck supple. No tracheal deviation present. No thyromegaly present.  No carotid bruit  Cardiovascular: Normal rate, regular rhythm and normal heart sounds.   No murmur heard.  No edema. Pulmonary/Chest: Effort normal and breath sounds normal. No respiratory distress. She has no wheezes. She has no rales.  Breast: deferred to Gyn Abdominal: Soft. She exhibits no distension. There is no tenderness.  Lymphadenopathy: She has no cervical adenopathy.  Skin: Skin is warm and dry. She is not diaphoretic.  Psychiatric: She has a normal mood and affect. Her behavior is normal.        Assessment & Plan:   Physical exam: Screening blood   ordered Immunizations   Td up to date by work Colonoscopy    - have not had one - will do cologuard-advised to check with insurance  company Mammogram   Due - will schedule Gyn   Due - will schedule Eye exams   Up to date  EKG  Done 04/2015 Exercise  treadmill at lunch Weight  Advised weight loss Skin   No concerns Substance abuse   none  See Problem List for Assessment and Plan of chronic medical problems.    Follow-up in 1 year

## 2018-02-25 NOTE — Patient Instructions (Addendum)
Test(s) ordered today. Your results will be released to [facility] (or called to you) after review, usually within 72hours after test completion. If any changes need to be made, you will be notified at that same time.  All other Health Maintenance issues reviewed.   All recommended immunizations and age-appropriate screenings are up-to-date or discussed.  No immunizations administered today.   Medications reviewed and updated.  No changes recommended at this time.   Please followup in one year   Health Maintenance, Female Adopting a healthy lifestyle and getting preventive care can go a long way to promote health and wellness. Talk with your health care provider about what schedule of regular examinations is right for you. This is a good chance for you to check in with your provider about disease prevention and staying healthy. In between checkups, there are plenty of things you can do on your own. Experts have done a lot of research about which lifestyle changes and preventive measures are most likely to keep you healthy. Ask your health care provider for more information. Weight and diet Eat a healthy diet  Be sure to include plenty of vegetables, fruits, low-fat dairy products, and lean protein.  Do not eat a lot of foods high in solid fats, added sugars, or salt.  Get regular exercise. This is one of the most important things you can do for your health. ? Most adults should exercise for at least 150 minutes each week. The exercise should increase your heart rate and make you sweat (moderate-intensity exercise). ? Most adults should also do strengthening exercises at least twice a week. This is in addition to the moderate-intensity exercise.  Maintain a healthy weight  Body mass index (BMI) is a measurement that can be used to identify possible weight problems. It estimates body fat based on height and weight. Your health care provider can help determine your BMI and help you achieve or  maintain a healthy weight.  For females 55 years of age and older: ? A BMI below 18.5 is considered underweight. ? A BMI of 18.5 to 24.9 is normal. ? A BMI of 25 to 29.9 is considered overweight. ? A BMI of 30 and above is considered obese.  Watch levels of cholesterol and blood lipids  You should start having your blood tested for lipids and cholesterol at 55 years of age, then have this test every 5 years.  You may need to have your cholesterol levels checked more often if: ? Your lipid or cholesterol levels are high. ? You are older than 55 years of age. ? You are at high risk for heart disease.  Cancer screening Lung Cancer  Lung cancer screening is recommended for adults 55-7 years old who are at high risk for lung cancer because of a history of smoking.  A yearly low-dose CT scan of the lungs is recommended for people who: ? Currently smoke. ? Have quit within the past 15 years. ? Have at least a 30-pack-year history of smoking. A pack year is smoking an average of one pack of cigarettes a day for 1 year.  Yearly screening should continue until it has been 15 years since you quit.  Yearly screening should stop if you develop a health problem that would prevent you from having lung cancer treatment.  Breast Cancer  Practice breast self-awareness. This means understanding how your breasts normally appear and feel.  It also means doing regular breast self-exams. Let your health care provider know about any changes,  no matter how small.  If you are in your 20s or 30s, you should have a clinical breast exam (CBE) by a health care provider every 1-3 years as part of a regular health exam.  If you are 40 or older, have a CBE every year. Also consider having a breast X-ray (mammogram) every year.  If you have a family history of breast cancer, talk to your health care provider about genetic screening.  If you are at high risk for breast cancer, talk to your health care  provider about having an MRI and a mammogram every year.  Breast cancer gene (BRCA) assessment is recommended for women who have family members with BRCA-related cancers. BRCA-related cancers include: ? Breast. ? Ovarian. ? Tubal. ? Peritoneal cancers.  Results of the assessment will determine the need for genetic counseling and BRCA1 and BRCA2 testing.  Cervical Cancer Your health care provider may recommend that you be screened regularly for cancer of the pelvic organs (ovaries, uterus, and vagina). This screening involves a pelvic examination, including checking for microscopic changes to the surface of your cervix (Pap test). You may be encouraged to have this screening done every 3 years, beginning at age 55.  For women ages 55-65, health care providers may recommend pelvic exams and Pap testing every 3 years, or they may recommend the Pap and pelvic exam, combined with testing for human papilloma virus (HPV), every 5 years. Some types of HPV increase your risk of cervical cancer. Testing for HPV may also be done on women of any age with unclear Pap test results.  Other health care providers may not recommend any screening for nonpregnant women who are considered low risk for pelvic cancer and who do not have symptoms. Ask your health care provider if a screening pelvic exam is right for you.  If you have had past treatment for cervical cancer or a condition that could lead to cancer, you need Pap tests and screening for cancer for at least 20 years after your treatment. If Pap tests have been discontinued, your risk factors (such as having a new sexual partner) need to be reassessed to determine if screening should resume. Some women have medical problems that increase the chance of getting cervical cancer. In these cases, your health care provider may recommend more frequent screening and Pap tests.  Colorectal Cancer  This type of cancer can be detected and often prevented.  Routine  colorectal cancer screening usually begins at 55 years of age and continues through 55 years of age.  Your health care provider may recommend screening at an earlier age if you have risk factors for colon cancer.  Your health care provider may also recommend using home test kits to check for hidden blood in the stool.  A small camera at the end of a tube can be used to examine your colon directly (sigmoidoscopy or colonoscopy). This is done to check for the earliest forms of colorectal cancer.  Routine screening usually begins at age 50.  Direct examination of the colon should be repeated every 5-10 years through 55 years of age. However, you may need to be screened more often if early forms of precancerous polyps or small growths are found.  Skin Cancer  Check your skin from head to toe regularly.  Tell your health care provider about any new moles or changes in moles, especially if there is a change in a mole's shape or color.  Also tell your health care provider if you   have a mole that is larger than the size of a pencil eraser.  Always use sunscreen. Apply sunscreen liberally and repeatedly throughout the day.  Protect yourself by wearing long sleeves, pants, a wide-brimmed hat, and sunglasses whenever you are outside.  Heart disease, diabetes, and high blood pressure  High blood pressure causes heart disease and increases the risk of stroke. High blood pressure is more likely to develop in: ? People who have blood pressure in the high end of the normal range (130-139/85-89 mm Hg). ? People who are overweight or obese. ? People who are African American.  If you are 24-25 years of age, have your blood pressure checked every 3-5 years. If you are 2 years of age or older, have your blood pressure checked every year. You should have your blood pressure measured twice-once when you are at a hospital or clinic, and once when you are not at a hospital or clinic. Record the average of the  two measurements. To check your blood pressure when you are not at a hospital or clinic, you can use: ? An automated blood pressure machine at a pharmacy. ? A home blood pressure monitor.  If you are between 42 years and 59 years old, ask your health care provider if you should take aspirin to prevent strokes.  Have regular diabetes screenings. This involves taking a blood sample to check your fasting blood sugar level. ? If you are at a normal weight and have a low risk for diabetes, have this test once every three years after 55 years of age. ? If you are overweight and have a high risk for diabetes, consider being tested at a younger age or more often. Preventing infection Hepatitis B  If you have a higher risk for hepatitis B, you should be screened for this virus. You are considered at high risk for hepatitis B if: ? You were born in a country where hepatitis B is common. Ask your health care provider which countries are considered high risk. ? Your parents were born in a high-risk country, and you have not been immunized against hepatitis B (hepatitis B vaccine). ? You have HIV or AIDS. ? You use needles to inject street drugs. ? You live with someone who has hepatitis B. ? You have had sex with someone who has hepatitis B. ? You get hemodialysis treatment. ? You take certain medicines for conditions, including cancer, organ transplantation, and autoimmune conditions.  Hepatitis C  Blood testing is recommended for: ? Everyone born from [birth year] through 1963. ? Anyone with known risk factors for hepatitis C.  Sexually transmitted infections (STIs)  You should be screened for sexually transmitted infections (STIs) including gonorrhea and chlamydia if: ? You are sexually active and are younger than 55 years of age. ? You are older than 55 years of age and your health care provider tells you that you are at risk for this type of infection. ? Your sexual activity has changed since you  were last screened and you are at an increased risk for chlamydia or gonorrhea. Ask your health care provider if you are at risk.  If you do not have HIV, but are at risk, it may be recommended that you take a prescription medicine daily to prevent HIV infection. This is called pre-exposure prophylaxis (PrEP). You are considered at risk if: ? You are sexually active and do not regularly use condoms or know the HIV status of your partner(s). ? You take drugs by injection. ?  You are sexually active with a partner who has HIV.  Talk with your health care provider about whether you are at high risk of being infected with HIV. If you choose to begin PrEP, you should first be tested for HIV. You should then be tested every 3 months for as long as you are taking PrEP. Pregnancy  If you are premenopausal and you may become pregnant, ask your health care provider about preconception counseling.  If you may become pregnant, take 400 to 800 micrograms (mcg) of folic acid every day.  If you want to prevent pregnancy, talk to your health care provider about birth control (contraception). Osteoporosis and menopause  Osteoporosis is a disease in which the bones lose minerals and strength with aging. This can result in serious bone fractures. Your risk for osteoporosis can be identified using a bone density scan.  If you are 65 years of age or older, or if you are at risk for osteoporosis and fractures, ask your health care provider if you should be screened.  Ask your health care provider whether you should take a calcium or vitamin D supplement to lower your risk for osteoporosis.  Menopause may have certain physical symptoms and risks.  Hormone replacement therapy may reduce some of these symptoms and risks. Talk to your health care provider about whether hormone replacement therapy is right for you. Follow these instructions at home:  Schedule regular health, dental, and eye exams.  Stay current  with your immunizations.  Do not use any tobacco products including cigarettes, chewing tobacco, or electronic cigarettes.  If you are pregnant, do not drink alcohol.  If you are breastfeeding, limit how much and how often you drink alcohol.  Limit alcohol intake to no more than 1 drink per day for nonpregnant women. One drink equals 12 ounces of beer, 5 ounces of wine, or 1 ounces of hard liquor.  Do not use street drugs.  Do not share needles.  Ask your health care provider for help if you need support or information about quitting drugs.  Tell your health care provider if you often feel depressed.  Tell your health care provider if you have ever been abused or do not feel safe at home. This information is not intended to replace advice given to you by your health care provider. Make sure you discuss any questions you have with your health care provider. Document Released: 04/21/2011 Document Revised: 03/13/2016 Document Reviewed: 07/10/2015 Elsevier Interactive Patient Education  2018 [employer]  

## 2018-02-26 ENCOUNTER — Ambulatory Visit (INDEPENDENT_AMBULATORY_CARE_PROVIDER_SITE_OTHER): Payer: 59 | Admitting: Internal Medicine

## 2018-02-26 ENCOUNTER — Encounter: Payer: Self-pay | Admitting: Internal Medicine

## 2018-02-26 VITALS — BP 134/88 | HR 84 | Temp 98.1°F | Resp 16 | Wt 179.0 lb

## 2018-02-26 DIAGNOSIS — Z0000 Encounter for general adult medical examination without abnormal findings: Secondary | ICD-10-CM

## 2018-02-26 DIAGNOSIS — I825Z2 Chronic embolism and thrombosis of unspecified deep veins of left distal lower extremity: Secondary | ICD-10-CM | POA: Diagnosis not present

## 2018-02-26 DIAGNOSIS — R739 Hyperglycemia, unspecified: Secondary | ICD-10-CM | POA: Diagnosis not present

## 2018-02-26 DIAGNOSIS — I10 Essential (primary) hypertension: Secondary | ICD-10-CM

## 2018-02-26 NOTE — Assessment & Plan Note (Signed)
Continue regular exercise Continue weight loss efforts Low sugar/carbohydrate diet A1c ordered

## 2018-02-26 NOTE — Assessment & Plan Note (Signed)
BP well controlled Current regimen effective and well tolerated Continue current medications at current doses CMP, TSH

## 2018-02-26 NOTE — Assessment & Plan Note (Signed)
On Xarelto Managed by Dr. Marin Olp

## 2018-03-16 ENCOUNTER — Ambulatory Visit (HOSPITAL_COMMUNITY)
Admission: EM | Admit: 2018-03-16 | Discharge: 2018-03-16 | Disposition: A | Discharge status: Home / Self Care | Payer: 59

## 2018-03-16 ENCOUNTER — Ambulatory Visit (INDEPENDENT_AMBULATORY_CARE_PROVIDER_SITE_OTHER): Payer: Self-pay | Admitting: Family Medicine

## 2018-03-16 VITALS — BP 126/82 | HR 123 | Temp 100.2°F | Resp 18 | Wt 174.8 lb

## 2018-03-16 DIAGNOSIS — H6691 Otitis media, unspecified, right ear: Secondary | ICD-10-CM

## 2018-03-16 DIAGNOSIS — H109 Unspecified conjunctivitis: Secondary | ICD-10-CM

## 2018-03-16 MED ORDER — CIPROFLOXACIN HCL 0.3 % OP SOLN: 1.0000 [drp] | Freq: Four times a day (QID) | OPHTHALMIC | 0 refills | Status: AC

## 2018-03-16 MED ORDER — AMOXICILLIN-POT CLAVULANATE 875-125 MG PO TABS: 1.0000 | ORAL_TABLET | Freq: Two times a day (BID) | ORAL | 0 refills | Status: DC

## 2018-03-16 MED FILL — AMOX TR-K CLV 875-125 MG TA: 875-125 | 10 days supply | Qty: 20 | Fill #0

## 2018-03-16 MED FILL — CIPROFLOXACIN 0.3% EYE DRO: 0.3 | 12 days supply | Qty: 5 | Fill #0

## 2018-03-16 NOTE — Progress Notes (Signed)
Janice Martin is a 56 y.o. female who presents today with concerns of eye redness and discharge and cough with fever and malaise and general feeling of unwell. Patient reports exposure to grandchildren in the last week who were sick.  Review of Systems  Constitutional: Negative for chills, fever and malaise/fatigue.  HENT: Negative for congestion, ear discharge, ear pain, sinus pain and sore throat.   Eyes: Positive for discharge and redness.  Respiratory: Positive for cough. Negative for sputum production and shortness of breath.   Cardiovascular: Negative.  Negative for chest pain.  Gastrointestinal: Negative for abdominal pain, diarrhea, nausea and vomiting.  Genitourinary: Negative for dysuria, frequency, hematuria and urgency.  Musculoskeletal: Negative for myalgias.  Skin: Negative.   Neurological: Negative for headaches.  Endo/Heme/Allergies: Negative.   Psychiatric/Behavioral: Negative.     O: Vitals:   03/16/18 1650  BP: 126/82  Pulse: (!) 123  Resp: 18  Temp: 100.2 F (37.9 C)  SpO2: 96%     Physical Exam  Constitutional: She is oriented to person, place, and time. Vital signs are normal. She appears well-developed and well-nourished. She is active.  Non-toxic appearance. She does not have a sickly appearance.  HENT:  Head: Normocephalic.  Right Ear: Hearing, external ear and ear canal normal. Tympanic membrane is injected, perforated, erythematous and bulging.  Left Ear: Hearing, tympanic membrane, external ear and ear canal normal.  Nose: Nose normal.  Mouth/Throat: Uvula is midline and oropharynx is clear and moist.  Eyes:    Neck: Normal range of motion. Neck supple.  Cardiovascular: Normal rate, regular rhythm, normal heart sounds and normal pulses.  Pulmonary/Chest: Effort normal and breath sounds normal.  Abdominal: Soft. Bowel sounds are normal.  Musculoskeletal: Normal range of motion.  Lymphadenopathy:       Head (right side): No submental and no  submandibular adenopathy present.       Head (left side): No submental and no submandibular adenopathy present.    She has no cervical adenopathy.  Neurological: She is alert and oriented to person, place, and time.  Psychiatric: She has a normal mood and affect.  Vitals reviewed.    A: 1. Right otitis media, unspecified otitis media type   2. Conjunctivitis of both eyes, unspecified conjunctivitis type      P: Work note for next 48 hours provided-  Exam findings, diagnosis etiology and medication use and indications reviewed with patient. Follow- Up and discharge instructions provided. No emergent/urgent issues found on exam.  Patient verbalized understanding of information provided and agrees with plan of care (POC), all questions answered.  1. Right otitis media, unspecified otitis media type - amoxicillin-clavulanate (AUGMENTIN) 875-125 MG tablet; Take 1 tablet by mouth 2 (two) times daily.  2. Conjunctivitis of both eyes, unspecified conjunctivitis type - ciprofloxacin (CILOXAN) 0.3 % ophthalmic solution; Place 1 drop into both eyes every 6 (six) hours for 7 days.

## 2018-03-16 NOTE — Patient Instructions (Signed)
Otitis Media, Adult Otitis media is redness, soreness, and puffiness (swelling) in the space just behind your eardrum (middle ear). It may be caused by allergies or infection. It often happens along with a cold. Follow these instructions at home:  Take your medicine as told. Finish it even if you start to feel better.  Only take over-the-counter or prescription medicines for pain, discomfort, or fever as told by your doctor.  Follow up with your doctor as told. Contact a doctor if:  You have otitis media only in one ear, or bleeding from your nose, or both.  You notice a lump on your neck.  You are not getting better in 3-5 days.  You feel worse instead of better. Get help right away if:  You have pain that is not helped with medicine.  You have puffiness, redness, or pain around your ear.  You get a stiff neck.  You cannot move part of your face (paralysis).  You notice that the bone behind your ear hurts when you touch it. This information is not intended to replace advice given to you by your health care provider. Make sure you discuss any questions you have with your health care provider. Document Released: 03/24/2008 Document Revised: 03/13/2016 Document Reviewed: 05/03/2013 Elsevier Interactive Patient Education  2017 Elsevier Inc. Bacterial Conjunctivitis Bacterial conjunctivitis is an infection of your conjunctiva. This is the clear membrane that covers the white part of your eye and the inner surface of your eyelid. This condition can make your eye:  Red or pink.  Itchy.  This condition is caused by bacteria. This condition spreads very easily from person to person (is contagious) and from one eye to the other eye. Follow these instructions at home: Medicines  Take or apply your antibiotic medicine as told by your doctor. Do not stop taking or applying the antibiotic even if you start to feel better.  Take or apply over-the-counter and prescription medicines  only as told by your doctor.  Do not touch your eyelid with the eye drop bottle or the ointment tube. Managing discomfort  Wipe any fluid from your eye with a warm, wet washcloth or a cotton ball.  Place a cool, clean washcloth on your eye. Do this for 10-20 minutes, 3-4 times per day. General instructions  Do not wear contact lenses until the irritation is gone. Wear glasses until your doctor says it is okay to wear contacts.  Do not wear eye makeup until your symptoms are gone. Throw away any old makeup.  Change or wash your pillowcase every day.  Do not share towels or washcloths with anyone.  Wash your hands often with soap and water. Use paper towels to dry your hands.  Do not touch or rub your eyes.  Do not drive or use heavy machinery if your vision is blurry. Contact a doctor if:  You have a fever.  Your symptoms do not get better after 10 days. Get help right away if:  You have a fever and your symptoms suddenly get worse.  You have very bad pain when you move your eye.  Your face: ? Hurts. ? Is red. ? Is swollen.  You have sudden loss of vision. This information is not intended to replace advice given to you by your health care provider. Make sure you discuss any questions you have with your health care provider. Document Released: 07/15/2008 Document Revised: 03/13/2016 Document Reviewed: 07/19/2015 Elsevier Interactive Patient Education  2018 Elsevier Inc.   

## 2018-03-17 ENCOUNTER — Encounter: Payer: Self-pay | Admitting: Family Medicine

## 2018-03-21 ENCOUNTER — Ambulatory Visit (HOSPITAL_COMMUNITY)
Admission: EM | Admit: 2018-03-21 | Discharge: 2018-03-21 | Disposition: A | Discharge status: Home / Self Care | Payer: 59 | Attending: Urgent Care | Admitting: Urgent Care

## 2018-03-21 ENCOUNTER — Encounter (HOSPITAL_COMMUNITY): Payer: Self-pay | Admitting: Emergency Medicine

## 2018-03-21 DIAGNOSIS — H9201 Otalgia, right ear: Secondary | ICD-10-CM

## 2018-03-21 DIAGNOSIS — H66011 Acute suppurative otitis media with spontaneous rupture of ear drum, right ear: Secondary | ICD-10-CM

## 2018-03-21 MED ORDER — CEFDINIR 300 MG PO CAPS: 300.0000 mg | ORAL_CAPSULE | Freq: Two times a day (BID) | ORAL | 0 refills | Status: DC

## 2018-03-21 NOTE — ED Provider Notes (Signed)
  MRN: [id number] DOB: [birth year]  Subjective:   Janice Martin is a 57 y.o. female presenting for persistent right ear pain.  Patient was seen in our direction at an [place] on 03/16/2018, started on Augmentin and Ciloxan eyedrops.  Today patient reports that she has had improvement except with her right ear pain.  She notes there is no difference in her symptoms there.  Denies fever, nausea, vomiting, belly pain, ear drainage.  No current facility-administered medications for this encounter.   Current Outpatient Medications:  .  amoxicillin-clavulanate (AUGMENTIN) 875-125 MG tablet, Take 1 tablet by mouth 2 (two) times daily., Disp: 20 tablet, Rfl: 0 .  BYSTOLIC 5 MG tablet, TAKE 1 TABLET (5 MG TOTAL) BY MOUTH DAILY., Disp: 90 tablet, Rfl: 1 .  ciprofloxacin (CILOXAN) 0.3 % ophthalmic solution, Place 1 drop into both eyes every 6 (six) hours for 7 days., Disp: 1.4 mL, Rfl: 0 .  Rivaroxaban (XARELTO) 10 MG TABS tablet, Take 1 tablet (10 mg total) by mouth daily with supper., Disp: 30 tablet, Rfl: 12 .  valsartan-hydrochlorothiazide (DIOVAN-HCT) 320-25 MG tablet, Take 1 tablet by mouth daily. (Patient taking differently: Take 1 tablet by mouth at bedtime. ), Disp: 90 tablet, Rfl: 1 .  vitamin B-12 (CYANOCOBALAMIN) 100 MCG tablet, Take 100 mcg by mouth at bedtime. , Disp: , Rfl:    Allergies  Allergen Reactions  . Bee Venom Anaphylaxis, Shortness Of Breath and Swelling  . Shellfish Allergy Anaphylaxis  . Influenza Vaccines     Per pt can't take mess up with her immune system  . Morphine Other (See Comments)    Burns really bad    Past Medical History:  Diagnosis Date  . ANEMIA-IRON DEFICIENCY   . Anxiety state, unspecified   . DVT of leg (deep venous thrombosis) ([place]) 04/04/2015  . HYPERTENSION   . Immune thrombocytopenic purpura ([place]) 2003 dx   s/p rituxan 06/2012 - now remission  . LEUKOCYTOSIS UNSPECIFIED   . Pulmonary embolism [facility]) June 2016   chronic anticoag due to high risk  recurrence     Past Surgical History:  Procedure Laterality Date  . CESAREAN SECTION     x's 2  . CHOLECYSTECTOMY    . Spleenectomy  1985 or 1986   due to ITP    Objective:   Vitals: BP (!) 151/90   Pulse 92   Temp 98.2 F (36.8 C)   Resp 18   LMP 02/22/2015   SpO2 100%   Physical Exam  Constitutional: She is oriented to person, place, and time. She appears well-developed and well-nourished.  HENT:  Patient has persistent erythematous bulging right TM with multiple defects of varying sizes.  Ear canal is normal, patent, no tragus tenderness.  Left TM is normal.  Cardiovascular: Normal rate.  Pulmonary/Chest: Effort normal.  Neurological: She is alert and oriented to person, place, and time.   Assessment and Plan :   Non-recurrent acute suppurative otitis media of right ear with spontaneous rupture of tympanic membrane  Otalgia of right ear  Will start patient on Cefdinir, stop Augmentin.  Maintain all other medications.  Return to clinic if symptoms persist.     Jaynee Eagles, PA-C 03/21/18 1745

## 2018-03-21 NOTE — ED Triage Notes (Signed)
Pt seen at [facility] 5/28 for R ear infection given antibiotics without relief.

## 2018-03-25 ENCOUNTER — Other Ambulatory Visit: Payer: Self-pay | Admitting: *Deleted

## 2018-03-25 DIAGNOSIS — D693 Immune thrombocytopenic purpura: Secondary | ICD-10-CM

## 2018-03-26 ENCOUNTER — Inpatient Hospital Stay: Payer: 59

## 2018-03-26 ENCOUNTER — Encounter: Payer: Self-pay | Admitting: Hematology & Oncology

## 2018-03-26 ENCOUNTER — Inpatient Hospital Stay: Payer: 59 | Attending: Hematology & Oncology | Admitting: Hematology & Oncology

## 2018-03-26 ENCOUNTER — Other Ambulatory Visit: Payer: Self-pay

## 2018-03-26 VITALS — BP 156/78 | HR 90 | Temp 98.2°F | Resp 16 | Wt 175.0 lb

## 2018-03-26 DIAGNOSIS — D693 Immune thrombocytopenic purpura: Secondary | ICD-10-CM

## 2018-03-26 DIAGNOSIS — I825Z1 Chronic embolism and thrombosis of unspecified deep veins of right distal lower extremity: Secondary | ICD-10-CM | POA: Insufficient documentation

## 2018-03-26 DIAGNOSIS — Z7901 Long term (current) use of anticoagulants: Secondary | ICD-10-CM | POA: Diagnosis not present

## 2018-03-26 LAB — CMP (CANCER CENTER ONLY)
ALT: 20 U/L (ref 10–47)
AST: 18 U/L (ref 11–38)
Albumin: 3.4 g/dL — ABNORMAL LOW (ref 3.5–5.0)
Alkaline Phosphatase: 105 U/L — ABNORMAL HIGH (ref 26–84)
Anion gap: 15 (ref 5–15)
BUN: 10 mg/dL (ref 7–22)
CO2: 30 mmol/L (ref 18–33)
Calcium: 9.2 mg/dL (ref 8.0–10.3)
Chloride: 100 mmol/L (ref 98–108)
Creatinine: 1 mg/dL (ref 0.60–1.20)
Glucose, Bld: 91 mg/dL (ref 73–118)
Potassium: 3.9 mmol/L (ref 3.3–4.7)
Sodium: 145 mmol/L (ref 128–145)
Total Bilirubin: 0.5 mg/dL (ref 0.2–1.6)
Total Protein: 7.7 g/dL (ref 6.4–8.1)

## 2018-03-26 LAB — CBC WITH DIFFERENTIAL (CANCER CENTER ONLY)
BASOS PCT: 1 %
Basophils Absolute: 0.1 10*3/uL (ref 0.0–0.1)
EOS ABS: 1 10*3/uL — AB (ref 0.0–0.5)
Eosinophils Relative: 8 %
HEMATOCRIT: 37.7 % (ref 34.8–46.6)
Hemoglobin: 12.7 g/dL (ref 11.6–15.9)
LYMPHS ABS: 4.7 10*3/uL — AB (ref 0.9–3.3)
Lymphocytes Relative: 37 %
MCH: 31.4 pg (ref 26.0–34.0)
MCHC: 33.7 g/dL (ref 32.0–36.0)
MCV: 93.1 fL (ref 81.0–101.0)
MONO ABS: 1 10*3/uL — AB (ref 0.1–0.9)
MONOS PCT: 8 %
NEUTROS PCT: 46 %
Neutro Abs: 6.2 10*3/uL (ref 1.5–6.5)
Platelet Count: 305 10*3/uL (ref 145–400)
RBC: 4.05 MIL/uL (ref 3.70–5.32)
RDW: 13.4 % (ref 11.1–15.7)
WBC Count: 13 10*3/uL — ABNORMAL HIGH (ref 3.9–10.0)

## 2018-03-26 LAB — PLATELET BY CITRATE

## 2018-03-26 NOTE — Progress Notes (Signed)
Hematology and Oncology Follow Up Visit  Janice Martin [id number] 1961-06-17 57 y.o. 03/26/2018   Principle Diagnosis:   Chronic immune thrombocytopenia-recurrent  Submassive pulmonary embolism, with right lower extremity thrombus  Current Therapy:    Xarelto 10 mg by mouth daily   Decadron-taper per protocol for relapsed thrombocytopenia     Interim History:  [name] is back for followup.  She comes in with her husband.  She recently had a upper respiratory infection.  She was on some antibiotics for this.  I think she was on amoxicillin.  She and her husband are going to the [place] tomorrow.  They are going on a cruise.  They are looking forward to this.  I told her to make sure that she drinks a lot of water and wears a lot of sunscreen.  She is had no bleeding.  There is been no bruising.  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  She has had no fever.  She is had no mouth sores.  There is been no cough or shortness of breath.  She is on low-dose Xarelto and doing well.   Overall, her performance status is ECOG 1.  Medications:  Current Outpatient Medications:  .  ciprofloxacin (CILOXAN) 0.3 % ophthalmic solution, Place 1 drop into both eyes every 2 (two) hours. Administer 1 drop, every 2 hours, while awake, for 2 days. Then 1 drop, every 4 hours, while awake, for the next 5 days., Disp: , Rfl:  .  BYSTOLIC 5 MG tablet, TAKE 1 TABLET (5 MG TOTAL) BY MOUTH DAILY., Disp: 90 tablet, Rfl: 1 .  cefdinir (OMNICEF) 300 MG capsule, Take 1 capsule (300 mg total) by mouth 2 (two) times daily., Disp: 20 capsule, Rfl: 0 .  Rivaroxaban (XARELTO) 10 MG TABS tablet, Take 1 tablet (10 mg total) by mouth daily with supper., Disp: 30 tablet, Rfl: 12 .  valsartan-hydrochlorothiazide (DIOVAN-HCT) 320-25 MG tablet, Take 1 tablet by mouth daily. (Patient taking differently: Take 1 tablet by mouth at bedtime. ), Disp: 90  tablet, Rfl: 1 .  vitamin B-12 (CYANOCOBALAMIN) 100 MCG tablet, Take 100 mcg by mouth at bedtime. , Disp: , Rfl:   Allergies:  Allergies  Allergen Reactions  . Bee Venom Anaphylaxis, Shortness Of Breath and Swelling  . Shellfish Allergy Anaphylaxis  . Influenza Vaccines     Per pt can't take mess up with her immune system  . Morphine Other (See Comments)    Burns really bad    Past Medical History, Surgical history, Social history, and Family History were reviewed and updated.  Review of Systems: Review of Systems  Constitutional: Negative.   HENT: Negative.   Eyes: Negative.   Respiratory: Negative.   Cardiovascular: Negative.   Gastrointestinal: Negative.   Genitourinary: Negative.   Musculoskeletal: Negative.   Skin: Negative.   Neurological: Negative.   Endo/Heme/Allergies: Negative.   Psychiatric/Behavioral: Negative.     Physical Exam:  weight is 175 lb (79.4 kg). Her oral temperature is 98.2 F (36.8 C). Her blood pressure is 156/78 (abnormal) and her pulse is 90. Her respiration is 16 and oxygen saturation is 97%.   Physical Exam  Constitutional: She is oriented to person, place, and time.  HENT:  Head: Normocephalic and atraumatic.  Mouth/Throat: Oropharynx is clear and moist.  Eyes: Pupils are equal, round, and reactive to light. EOM are normal.  Neck: Normal range of motion.  Cardiovascular: Normal rate, regular  rhythm and normal heart sounds.  Pulmonary/Chest: Effort normal and breath sounds normal.  Abdominal: Soft. Bowel sounds are normal.  She has well-healed splenectomy scar.  No fluid wave.  No palpable liver.  No guarding or rebound tenderness.  Musculoskeletal: Normal range of motion. She exhibits no edema, tenderness or deformity.  Lymphadenopathy:    She has no cervical adenopathy.  Neurological: She is alert and oriented to person, place, and time.  Skin: Skin is warm and dry. No rash noted. No erythema.  Psychiatric: She has a normal mood and  affect. Her behavior is normal. Judgment and thought content normal.  Vitals reviewed.   Lab Results  Component Value Date   WBC 13.0 (H) 03/26/2018   HGB 12.7 03/26/2018   HCT 37.7 03/26/2018   MCV 93.1 03/26/2018   PLT 305 03/26/2018     Chemistry      Component Value Date/Time   NA 145 03/26/2018 1442   NA 140 09/17/2017 1507   K 3.9 03/26/2018 1442   K 3.9 09/17/2017 1507   CL 100 03/26/2018 1442   CL 99 04/17/2017 1411   CO2 30 03/26/2018 1442   CO2 30 (H) 09/17/2017 1507   BUN 10 03/26/2018 1442   BUN 8.9 09/17/2017 1507   CREATININE 1.00 03/26/2018 1442   CREATININE 1.2 (H) 09/17/2017 1507      Component Value Date/Time   CALCIUM 9.2 03/26/2018 1442   CALCIUM 9.3 09/17/2017 1507   ALKPHOS 105 (H) 03/26/2018 1442   ALKPHOS 105 09/17/2017 1507   AST 18 03/26/2018 1442   AST 15 09/17/2017 1507   ALT 20 03/26/2018 1442   ALT 18 09/17/2017 1507   BILITOT 0.5 03/26/2018 1442   BILITOT 0.38 09/17/2017 1507       Impression and Plan: Janice Martin is a 57 year old white female. She has a history of relapsed chronic immune thrombocytopenia.   Typically, she has a relapse every couple years.  At this point, we will just watch her blood counts as needed.  She will have her blood work done if she feels that her platelet count is dropping.  I will plan to have her come back to see [place] in another 4 months.  I am just happy that she is still responding to Decadron.    [name], MD 6/7/20194:05 PM

## 2018-04-05 ENCOUNTER — Encounter: Payer: Self-pay | Admitting: Internal Medicine

## 2018-04-05 DIAGNOSIS — H938X9 Other specified disorders of ear, unspecified ear: Secondary | ICD-10-CM

## 2018-04-12 ENCOUNTER — Encounter: Payer: Self-pay | Admitting: Hematology & Oncology

## 2018-04-12 ENCOUNTER — Other Ambulatory Visit: Payer: Self-pay | Admitting: *Deleted

## 2018-04-12 ENCOUNTER — Inpatient Hospital Stay: Payer: 59

## 2018-04-12 ENCOUNTER — Telehealth: Payer: Self-pay | Admitting: *Deleted

## 2018-04-12 DIAGNOSIS — D693 Immune thrombocytopenic purpura: Secondary | ICD-10-CM | POA: Diagnosis not present

## 2018-04-12 DIAGNOSIS — Z7901 Long term (current) use of anticoagulants: Secondary | ICD-10-CM | POA: Diagnosis not present

## 2018-04-12 DIAGNOSIS — I825Z1 Chronic embolism and thrombosis of unspecified deep veins of right distal lower extremity: Secondary | ICD-10-CM | POA: Diagnosis not present

## 2018-04-12 LAB — CBC WITH DIFFERENTIAL (CANCER CENTER ONLY)
BASOS ABS: 0.1 10*3/uL (ref 0.0–0.1)
Basophils Relative: 1 %
EOS ABS: 0.5 10*3/uL (ref 0.0–0.5)
Eosinophils Relative: 5 %
HCT: 41.1 % (ref 34.8–46.6)
HEMOGLOBIN: 14 g/dL (ref 11.6–15.9)
LYMPHS ABS: 3.3 10*3/uL (ref 0.9–3.3)
LYMPHS PCT: 35 %
MCH: 31.3 pg (ref 25.1–34.0)
MCHC: 34.1 g/dL (ref 31.5–36.0)
MCV: 91.7 fL (ref 79.5–101.0)
Monocytes Absolute: 0.9 10*3/uL (ref 0.1–0.9)
Monocytes Relative: 10 %
NEUTROS PCT: 49 %
Neutro Abs: 4.7 10*3/uL (ref 1.5–6.5)
Platelet Count: 8 10*3/uL — CL (ref 145–400)
RBC: 4.48 MIL/uL (ref 3.70–5.45)
RDW: 14 % (ref 11.2–14.5)
WBC Count: 9.4 10*3/uL (ref 3.9–10.3)

## 2018-04-12 NOTE — Telephone Encounter (Signed)
Critical Value Platelets 8 Dr Marin Olp notified. He would like patient to start her steroid treatment plan and have a lab recheck on Friday.  Spoke with Patient. She understands to start her steroids. She requests the labs be drawn in [place]. Message sent to scheduler.

## 2018-04-16 ENCOUNTER — Inpatient Hospital Stay: Payer: 59

## 2018-04-16 DIAGNOSIS — D693 Immune thrombocytopenic purpura: Secondary | ICD-10-CM

## 2018-04-16 DIAGNOSIS — Z7901 Long term (current) use of anticoagulants: Secondary | ICD-10-CM | POA: Diagnosis not present

## 2018-04-16 DIAGNOSIS — I825Z1 Chronic embolism and thrombosis of unspecified deep veins of right distal lower extremity: Secondary | ICD-10-CM

## 2018-04-16 LAB — CBC WITH DIFFERENTIAL (CANCER CENTER ONLY)
BASOS PCT: 0 %
Basophils Absolute: 0 10*3/uL (ref 0.0–0.1)
Eosinophils Absolute: 0 10*3/uL (ref 0.0–0.5)
Eosinophils Relative: 0 %
HEMATOCRIT: 39.6 % (ref 34.8–46.6)
HEMOGLOBIN: 13.2 g/dL (ref 11.6–15.9)
LYMPHS PCT: 8 %
Lymphs Abs: 1 10*3/uL (ref 0.9–3.3)
MCH: 30.9 pg (ref 25.1–34.0)
MCHC: 33.2 g/dL (ref 31.5–36.0)
MCV: 92.9 fL (ref 79.5–101.0)
MONOS PCT: 2 %
Monocytes Absolute: 0.2 10*3/uL (ref 0.1–0.9)
NEUTROS ABS: 11.4 10*3/uL — AB (ref 1.5–6.5)
Neutrophils Relative %: 90 %
Platelet Count: 186 10*3/uL (ref 145–400)
RBC: 4.27 MIL/uL (ref 3.70–5.45)
RDW: 14.8 % — ABNORMAL HIGH (ref 11.2–14.5)
WBC Count: 12.7 10*3/uL — ABNORMAL HIGH (ref 3.9–10.3)

## 2018-04-16 LAB — COMPREHENSIVE METABOLIC PANEL
ALBUMIN: 3.8 g/dL (ref 3.5–5.0)
ALK PHOS: 86 U/L (ref 38–126)
ALT: 17 U/L (ref 0–44)
AST: 17 U/L (ref 15–41)
Anion gap: 10 (ref 5–15)
BUN: 34 mg/dL — AB (ref 6–20)
CALCIUM: 9.2 mg/dL (ref 8.9–10.3)
CO2: 27 mmol/L (ref 22–32)
Chloride: 102 mmol/L (ref 98–111)
Creatinine, Ser: 1.2 mg/dL — ABNORMAL HIGH (ref 0.44–1.00)
GFR calc Af Amer: 58 mL/min — ABNORMAL LOW (ref >60)
GFR calc non Af Amer: 50 mL/min — ABNORMAL LOW (ref >60)
GLUCOSE: 142 mg/dL — AB (ref 70–99)
Potassium: 4.2 mmol/L (ref 3.5–5.1)
SODIUM: 139 mmol/L (ref 135–145)
Total Bilirubin: 0.5 mg/dL (ref 0.3–1.2)
Total Protein: 7.4 g/dL (ref 6.5–8.1)

## 2018-04-16 LAB — PLATELET BY CITRATE

## 2018-05-10 DIAGNOSIS — H9191 Unspecified hearing loss, right ear: Secondary | ICD-10-CM | POA: Diagnosis not present

## 2018-05-11 DIAGNOSIS — H903 Sensorineural hearing loss, bilateral: Secondary | ICD-10-CM | POA: Diagnosis not present

## 2018-05-12 ENCOUNTER — Other Ambulatory Visit: Payer: Self-pay | Admitting: Internal Medicine

## 2018-05-12 MED FILL — VALSARTAN-HCTZ 320-25 MG TA: 320-25 | 90 days supply | Qty: 90 | Fill #0

## 2018-05-12 MED FILL — BYSTOLIC 5 MG TABLET: 5 | 90 days supply | Qty: 90 | Fill #0

## 2018-05-25 ENCOUNTER — Encounter: Payer: Self-pay | Admitting: Hematology & Oncology

## 2018-05-26 ENCOUNTER — Other Ambulatory Visit: Payer: Self-pay | Admitting: *Deleted

## 2018-05-26 ENCOUNTER — Inpatient Hospital Stay: Payer: 59 | Attending: Hematology & Oncology

## 2018-05-26 DIAGNOSIS — I825Z1 Chronic embolism and thrombosis of unspecified deep veins of right distal lower extremity: Secondary | ICD-10-CM | POA: Insufficient documentation

## 2018-05-26 DIAGNOSIS — D693 Immune thrombocytopenic purpura: Secondary | ICD-10-CM | POA: Insufficient documentation

## 2018-05-26 DIAGNOSIS — Z7901 Long term (current) use of anticoagulants: Secondary | ICD-10-CM | POA: Diagnosis not present

## 2018-05-26 DIAGNOSIS — Z79899 Other long term (current) drug therapy: Secondary | ICD-10-CM | POA: Diagnosis not present

## 2018-05-26 DIAGNOSIS — I2699 Other pulmonary embolism without acute cor pulmonale: Secondary | ICD-10-CM | POA: Diagnosis not present

## 2018-05-26 LAB — CBC WITH DIFFERENTIAL (CANCER CENTER ONLY)
BASOS ABS: 0 10*3/uL (ref 0.0–0.1)
BASOS PCT: 0 %
EOS ABS: 0 10*3/uL (ref 0.0–0.5)
EOS PCT: 0 %
HCT: 43.3 % (ref 34.8–46.6)
Hemoglobin: 14.9 g/dL (ref 11.6–15.9)
Lymphocytes Relative: 11 %
Lymphs Abs: 1.8 10*3/uL (ref 0.9–3.3)
MCH: 31.2 pg (ref 25.1–34.0)
MCHC: 34.4 g/dL (ref 31.5–36.0)
MCV: 90.8 fL (ref 79.5–101.0)
Monocytes Absolute: 0.5 10*3/uL (ref 0.1–0.9)
Monocytes Relative: 3 %
Neutro Abs: 14.6 10*3/uL — ABNORMAL HIGH (ref 1.5–6.5)
Neutrophils Relative %: 86 %
PLATELETS: 15 10*3/uL — AB (ref 145–400)
RBC: 4.77 MIL/uL (ref 3.70–5.45)
RDW: 14.1 % (ref 11.2–14.5)
WBC: 16.8 10*3/uL — AB (ref 3.9–10.3)

## 2018-05-31 ENCOUNTER — Telehealth: Payer: Self-pay | Admitting: *Deleted

## 2018-05-31 ENCOUNTER — Inpatient Hospital Stay: Payer: 59

## 2018-05-31 DIAGNOSIS — D693 Immune thrombocytopenic purpura: Secondary | ICD-10-CM

## 2018-05-31 DIAGNOSIS — Z7901 Long term (current) use of anticoagulants: Secondary | ICD-10-CM | POA: Diagnosis not present

## 2018-05-31 DIAGNOSIS — I2699 Other pulmonary embolism without acute cor pulmonale: Secondary | ICD-10-CM | POA: Diagnosis not present

## 2018-05-31 DIAGNOSIS — I825Z1 Chronic embolism and thrombosis of unspecified deep veins of right distal lower extremity: Secondary | ICD-10-CM | POA: Diagnosis not present

## 2018-05-31 DIAGNOSIS — Z79899 Other long term (current) drug therapy: Secondary | ICD-10-CM | POA: Diagnosis not present

## 2018-05-31 LAB — CBC WITH DIFFERENTIAL (CANCER CENTER ONLY)
Basophils Absolute: 0 10*3/uL (ref 0.0–0.1)
Basophils Relative: 0 %
EOS PCT: 0 %
Eosinophils Absolute: 0 10*3/uL (ref 0.0–0.5)
HEMATOCRIT: 43.5 % (ref 34.8–46.6)
Hemoglobin: 14.9 g/dL (ref 11.6–15.9)
LYMPHS PCT: 18 %
Lymphs Abs: 3.3 10*3/uL (ref 0.9–3.3)
MCH: 31.2 pg (ref 25.1–34.0)
MCHC: 34.3 g/dL (ref 31.5–36.0)
MCV: 91 fL (ref 79.5–101.0)
MONOS PCT: 6 %
Monocytes Absolute: 1.1 10*3/uL — ABNORMAL HIGH (ref 0.1–0.9)
NEUTROS ABS: 13.7 10*3/uL — AB (ref 1.5–6.5)
Neutrophils Relative %: 76 %
PLATELETS: 198 10*3/uL (ref 145–400)
RBC: 4.78 MIL/uL (ref 3.70–5.45)
RDW: 14.3 % (ref 11.2–14.5)
WBC Count: 18.1 10*3/uL — ABNORMAL HIGH (ref 3.9–10.3)

## 2018-05-31 NOTE — Telephone Encounter (Addendum)
Patient is aware of results  ----- Message from [name], MD sent at 05/31/2018 12:40 PM EDT ----- Call - platelets back to normal!!  Janice Martin

## 2018-06-10 ENCOUNTER — Other Ambulatory Visit: Payer: Self-pay | Admitting: Hematology & Oncology

## 2018-06-10 DIAGNOSIS — D693 Immune thrombocytopenic purpura: Secondary | ICD-10-CM

## 2018-06-10 DIAGNOSIS — I2782 Chronic pulmonary embolism: Secondary | ICD-10-CM

## 2018-06-10 DIAGNOSIS — I2692 Saddle embolus of pulmonary artery without acute cor pulmonale: Secondary | ICD-10-CM

## 2018-06-10 MED FILL — XARELTO 10 MG TABLET: 10 | 90 days supply | Qty: 90 | Fill #0

## 2018-06-17 ENCOUNTER — Other Ambulatory Visit: Payer: Self-pay | Admitting: *Deleted

## 2018-06-17 DIAGNOSIS — D693 Immune thrombocytopenic purpura: Secondary | ICD-10-CM

## 2018-06-18 ENCOUNTER — Inpatient Hospital Stay: Payer: 59

## 2018-06-18 ENCOUNTER — Encounter: Payer: Self-pay | Admitting: Hematology & Oncology

## 2018-06-18 ENCOUNTER — Inpatient Hospital Stay (HOSPITAL_BASED_OUTPATIENT_CLINIC_OR_DEPARTMENT_OTHER): Payer: 59 | Admitting: Hematology & Oncology

## 2018-06-18 ENCOUNTER — Other Ambulatory Visit: Payer: Self-pay

## 2018-06-18 VITALS — BP 161/90 | HR 77 | Temp 98.3°F | Resp 18 | Wt 178.0 lb

## 2018-06-18 DIAGNOSIS — I2699 Other pulmonary embolism without acute cor pulmonale: Secondary | ICD-10-CM | POA: Diagnosis not present

## 2018-06-18 DIAGNOSIS — D693 Immune thrombocytopenic purpura: Secondary | ICD-10-CM | POA: Diagnosis not present

## 2018-06-18 DIAGNOSIS — I825Z1 Chronic embolism and thrombosis of unspecified deep veins of right distal lower extremity: Secondary | ICD-10-CM

## 2018-06-18 DIAGNOSIS — Z7901 Long term (current) use of anticoagulants: Secondary | ICD-10-CM | POA: Diagnosis not present

## 2018-06-18 DIAGNOSIS — Z79899 Other long term (current) drug therapy: Secondary | ICD-10-CM | POA: Diagnosis not present

## 2018-06-18 LAB — CMP (CANCER CENTER ONLY)
ALBUMIN: 3.9 g/dL (ref 3.5–5.0)
ALT: 16 U/L (ref 10–47)
ANION GAP: 8 (ref 5–15)
AST: 16 U/L (ref 11–38)
Alkaline Phosphatase: 83 U/L (ref 38–126)
BILIRUBIN TOTAL: 0.7 mg/dL (ref 0.2–1.6)
BUN: 10 mg/dL (ref 6–20)
CHLORIDE: 101 mmol/L (ref 98–111)
CO2: 31 mmol/L (ref 22–32)
Calcium: 9.3 mg/dL (ref 8.9–10.3)
Creatinine: 0.82 mg/dL (ref 0.60–1.20)
GLUCOSE: 89 mg/dL (ref 70–99)
POTASSIUM: 4.3 mmol/L (ref 3.5–5.1)
SODIUM: 140 mmol/L (ref 135–145)
Total Protein: 7.2 g/dL (ref 6.5–8.1)

## 2018-06-18 LAB — CBC WITH DIFFERENTIAL (CANCER CENTER ONLY)
BASOS PCT: 1 %
Basophils Absolute: 0.1 10*3/uL (ref 0.0–0.1)
EOS ABS: 0.9 10*3/uL — AB (ref 0.0–0.5)
EOS PCT: 8 %
HCT: 38.4 % (ref 34.8–46.6)
HEMOGLOBIN: 12.8 g/dL (ref 11.6–15.9)
LYMPHS ABS: 4.9 10*3/uL — AB (ref 0.9–3.3)
Lymphocytes Relative: 43 %
MCH: 30.8 pg (ref 26.0–34.0)
MCHC: 33.3 g/dL (ref 32.0–36.0)
MCV: 92.5 fL (ref 81.0–101.0)
Monocytes Absolute: 0.9 10*3/uL (ref 0.1–0.9)
Monocytes Relative: 8 %
NEUTROS PCT: 40 %
Neutro Abs: 4.5 10*3/uL (ref 1.5–6.5)
Platelet Count: 314 10*3/uL (ref 145–400)
RBC: 4.15 MIL/uL (ref 3.70–5.32)
RDW: 14.3 % (ref 11.1–15.7)
WBC: 11.2 10*3/uL — AB (ref 3.9–10.0)

## 2018-06-18 LAB — PLATELET BY CITRATE

## 2018-06-18 NOTE — Progress Notes (Signed)
Hematology and Oncology Follow Up Visit  Janice Martin [id number] Jul 07, 1961 57 y.o. 06/18/2018   Principle Diagnosis:   Chronic immune thrombocytopenia-recurrent  Submassive pulmonary embolism, with right lower extremity thrombus  Current Therapy:    Xarelto 10 mg by mouth daily   Decadron-taper per protocol for relapsed thrombocytopenia     Interim History:  [name] is back for followup.   Unfortunately, she has had a couple relapses of the ITP.  She knows when she relapses because she developed petechia on her legs.  When this happens, she then takes some steroids and this helps.  Decadron seems to do pretty well for her.  She is having more difficulty with overall quality of life issues.  She says that when she takes steroids, she just feels very anxious.  She has a hard time doing her job.  She really cannot focus.  I feel bad for when this happens.  She is worried about traveling by plane.  She thinks that she will have a problem with the pulmonary emboli that she has had in the past.  She is still on Xarelto.  I told her that I would think that it would be okay for her to fly.  She has had no bleeding.  She has had no fever.  She has had no change in bowel or bladder habits.  Overall, her performance status is ECOG 1.  Medications:  Current Outpatient Medications:  .  BYSTOLIC 5 MG tablet, TAKE 1 TABLET (5 MG TOTAL) BY MOUTH DAILY., Disp: 90 tablet, Rfl: 1 .  Rivaroxaban (XARELTO) 10 MG TABS tablet, Take 1 tablet (10 mg total) by mouth daily with supper., Disp: 30 tablet, Rfl: 12 .  valsartan-hydrochlorothiazide (DIOVAN-HCT) 320-25 MG tablet, Take 1 tablet by mouth daily. (Patient taking differently: Take 1 tablet by mouth at bedtime. ), Disp: 90 tablet, Rfl: 1 .  vitamin B-12 (CYANOCOBALAMIN) 100 MCG tablet, Take 100 mcg by mouth at bedtime. , Disp: , Rfl:   Allergies:  Allergies  Allergen Reactions  . Bee Venom Anaphylaxis, Shortness Of Breath and Swelling  .  Shellfish Allergy Anaphylaxis  . Influenza Vaccines     Per pt can't take mess up with her immune system  . Morphine Other (See Comments)    Burns really bad    Past Medical History, Surgical history, Social history, and Family History were reviewed and updated.  Review of Systems: Review of Systems  Constitutional: Negative.   HENT: Negative.   Eyes: Negative.   Respiratory: Negative.   Cardiovascular: Negative.   Gastrointestinal: Negative.   Genitourinary: Negative.   Musculoskeletal: Negative.   Skin: Negative.   Neurological: Negative.   Endo/Heme/Allergies: Negative.   Psychiatric/Behavioral: Negative.     Physical Exam:  weight is 178 lb (80.7 kg). Her oral temperature is 98.3 F (36.8 C). Her blood pressure is 161/90 (abnormal) and her pulse is 77. Her respiration is 18 and oxygen saturation is 100%.   Physical Exam  Constitutional: She is oriented to person, place, and time.  HENT:  Head: Normocephalic and atraumatic.  Mouth/Throat: Oropharynx is clear and moist.  Eyes: Pupils are equal, round, and reactive to light. EOM are normal.  Neck: Normal range of motion.  Cardiovascular: Normal rate, regular rhythm and normal heart sounds.  Pulmonary/Chest: Effort normal and breath sounds normal.  Abdominal: Soft. Bowel sounds are normal.  She has well-healed splenectomy scar.  No fluid wave.  No palpable liver.  No guarding or rebound tenderness.  Musculoskeletal: Normal range of motion. She exhibits no edema, tenderness or deformity.  Lymphadenopathy:    She has no cervical adenopathy.  Neurological: She is alert and oriented to person, place, and time.  Skin: Skin is warm and dry. No rash noted. No erythema.  Psychiatric: She has a normal mood and affect. Her behavior is normal. Judgment and thought content normal.  Vitals reviewed.   Lab Results  Component Value Date   WBC 11.2 (H) 06/18/2018   HGB 12.8 06/18/2018   HCT 38.4 06/18/2018   MCV 92.5 06/18/2018    PLT 314 06/18/2018     Chemistry      Component Value Date/Time   NA 140 06/18/2018 1502   NA 140 09/17/2017 1507   K 4.3 06/18/2018 1502   K 3.9 09/17/2017 1507   CL 101 06/18/2018 1502   CL 99 04/17/2017 1411   CO2 31 06/18/2018 1502   CO2 30 (H) 09/17/2017 1507   BUN 10 06/18/2018 1502   BUN 8.9 09/17/2017 1507   CREATININE 0.82 06/18/2018 1502   CREATININE 1.2 (H) 09/17/2017 1507      Component Value Date/Time   CALCIUM 9.3 06/18/2018 1502   CALCIUM 9.3 09/17/2017 1507   ALKPHOS 83 06/18/2018 1502   ALKPHOS 105 09/17/2017 1507   AST 16 06/18/2018 1502   AST 15 09/17/2017 1507   ALT 16 06/18/2018 1502   ALT 18 09/17/2017 1507   BILITOT 0.7 06/18/2018 1502   BILITOT 0.38 09/17/2017 1507       Impression and Plan: Ms. Janice Martin is a 57 year old white female. She has a history of relapsed chronic immune thrombocytopenia.   The relapses are becoming more frequent.  We are going to have to be cognizant of this.  I cannot have her keep taking steroids.  This will ultimately affect her bones and lead to osteoporosis and possible fractures.  Thankfully, her platelet count is doing okay today.  I will plan to see her back in 3 months.  I want to see her back before the holiday season.  She knows that she can always come back sooner if there are any problems.      [name], MD 8/30/20194:32 PM

## 2018-06-25 ENCOUNTER — Ambulatory Visit: Payer: Self-pay | Admitting: Hematology & Oncology

## 2018-06-25 ENCOUNTER — Other Ambulatory Visit: Payer: Self-pay

## 2018-07-07 ENCOUNTER — Inpatient Hospital Stay: Payer: 59 | Attending: Hematology & Oncology

## 2018-07-07 ENCOUNTER — Other Ambulatory Visit: Payer: Self-pay | Admitting: *Deleted

## 2018-07-07 DIAGNOSIS — D693 Immune thrombocytopenic purpura: Secondary | ICD-10-CM

## 2018-07-07 DIAGNOSIS — I825Z1 Chronic embolism and thrombosis of unspecified deep veins of right distal lower extremity: Secondary | ICD-10-CM

## 2018-07-07 LAB — CBC WITH DIFFERENTIAL (CANCER CENTER ONLY)
BASOS PCT: 1 %
Basophils Absolute: 0.1 10*3/uL (ref 0.0–0.1)
EOS ABS: 1.6 10*3/uL — AB (ref 0.0–0.5)
Eosinophils Relative: 11 %
HCT: 40 % (ref 34.8–46.6)
HEMOGLOBIN: 13.7 g/dL (ref 11.6–15.9)
Lymphocytes Relative: 36 %
Lymphs Abs: 5.1 10*3/uL — ABNORMAL HIGH (ref 0.9–3.3)
MCH: 31.4 pg (ref 25.1–34.0)
MCHC: 34.3 g/dL (ref 31.5–36.0)
MCV: 91.7 fL (ref 79.5–101.0)
MONOS PCT: 7 %
Monocytes Absolute: 1.1 10*3/uL — ABNORMAL HIGH (ref 0.1–0.9)
NEUTROS PCT: 45 %
Neutro Abs: 6.4 10*3/uL (ref 1.5–6.5)
PLATELETS: 23 10*3/uL — AB (ref 145–400)
RBC: 4.36 MIL/uL (ref 3.70–5.45)
RDW: 14.3 % (ref 11.2–14.5)
WBC Count: 14.4 10*3/uL — ABNORMAL HIGH (ref 3.9–10.3)

## 2018-07-07 MED FILL — DEXAMETHASONE 4 MG TABLET: 4 | 24 days supply | Qty: 84 | Fill #1

## 2018-07-12 ENCOUNTER — Other Ambulatory Visit: Payer: Self-pay

## 2018-07-19 ENCOUNTER — Inpatient Hospital Stay: Payer: 59

## 2018-07-19 DIAGNOSIS — D693 Immune thrombocytopenic purpura: Secondary | ICD-10-CM | POA: Diagnosis not present

## 2018-07-19 DIAGNOSIS — I825Z1 Chronic embolism and thrombosis of unspecified deep veins of right distal lower extremity: Secondary | ICD-10-CM

## 2018-07-19 LAB — CBC WITH DIFFERENTIAL (CANCER CENTER ONLY)
BASOS ABS: 0.2 10*3/uL — AB (ref 0.0–0.1)
Basophils Relative: 1 %
EOS PCT: 8 %
Eosinophils Absolute: 1.1 10*3/uL — ABNORMAL HIGH (ref 0.0–0.5)
HCT: 36.1 % (ref 34.8–46.6)
Hemoglobin: 12.2 g/dL (ref 11.6–15.9)
Lymphocytes Relative: 34 %
Lymphs Abs: 4.8 10*3/uL — ABNORMAL HIGH (ref 0.9–3.3)
MCH: 31.7 pg (ref 25.1–34.0)
MCHC: 33.8 g/dL (ref 31.5–36.0)
MCV: 93.8 fL (ref 79.5–101.0)
MONO ABS: 0.9 10*3/uL (ref 0.1–0.9)
Monocytes Relative: 6 %
Neutro Abs: 7.3 10*3/uL — ABNORMAL HIGH (ref 1.5–6.5)
Neutrophils Relative %: 51 %
PLATELETS: 407 10*3/uL — AB (ref 145–400)
RBC: 3.85 MIL/uL (ref 3.70–5.45)
RDW: 14.9 % — AB (ref 11.2–14.5)
WBC Count: 14.2 10*3/uL — ABNORMAL HIGH (ref 3.9–10.3)

## 2018-07-19 LAB — CMP (CANCER CENTER ONLY)
ALT: 21 U/L (ref 0–44)
AST: 13 U/L — AB (ref 15–41)
Albumin: 3.4 g/dL — ABNORMAL LOW (ref 3.5–5.0)
Alkaline Phosphatase: 100 U/L (ref 38–126)
Anion gap: 6 (ref 5–15)
BILIRUBIN TOTAL: 0.3 mg/dL (ref 0.3–1.2)
BUN: 15 mg/dL (ref 6–20)
CO2: 31 mmol/L (ref 22–32)
Calcium: 9.1 mg/dL (ref 8.9–10.3)
Chloride: 103 mmol/L (ref 98–111)
Creatinine: 0.84 mg/dL (ref 0.44–1.00)
GFR, Est AFR Am: >60 mL/min (ref >60)
Glucose, Bld: 95 mg/dL (ref 70–99)
POTASSIUM: 4.7 mmol/L (ref 3.5–5.1)
Sodium: 140 mmol/L (ref 135–145)
Total Protein: 6.4 g/dL — ABNORMAL LOW (ref 6.5–8.1)

## 2018-07-19 LAB — PLATELET BY CITRATE

## 2018-08-09 ENCOUNTER — Other Ambulatory Visit: Payer: Self-pay | Admitting: *Deleted

## 2018-08-09 ENCOUNTER — Encounter: Payer: Self-pay | Admitting: Hematology

## 2018-08-09 ENCOUNTER — Telehealth: Payer: Self-pay | Admitting: *Deleted

## 2018-08-09 ENCOUNTER — Inpatient Hospital Stay: Payer: 59 | Attending: Hematology & Oncology

## 2018-08-09 ENCOUNTER — Inpatient Hospital Stay (HOSPITAL_BASED_OUTPATIENT_CLINIC_OR_DEPARTMENT_OTHER): Payer: 59 | Admitting: Hematology

## 2018-08-09 ENCOUNTER — Encounter: Payer: Self-pay | Admitting: *Deleted

## 2018-08-09 VITALS — BP 170/89 | HR 88 | Temp 97.9°F | Resp 18 | Ht 64.0 in | Wt 185.1 lb

## 2018-08-09 DIAGNOSIS — Z86718 Personal history of other venous thrombosis and embolism: Secondary | ICD-10-CM | POA: Insufficient documentation

## 2018-08-09 DIAGNOSIS — D693 Immune thrombocytopenic purpura: Secondary | ICD-10-CM

## 2018-08-09 DIAGNOSIS — Z7901 Long term (current) use of anticoagulants: Secondary | ICD-10-CM | POA: Insufficient documentation

## 2018-08-09 DIAGNOSIS — I825Z1 Chronic embolism and thrombosis of unspecified deep veins of right distal lower extremity: Secondary | ICD-10-CM

## 2018-08-09 DIAGNOSIS — Z86711 Personal history of pulmonary embolism: Secondary | ICD-10-CM | POA: Diagnosis not present

## 2018-08-09 LAB — CBC WITH DIFFERENTIAL (CANCER CENTER ONLY)
ABS IMMATURE GRANULOCYTES: 0.09 10*3/uL — AB (ref 0.00–0.07)
Basophils Absolute: 0.1 10*3/uL (ref 0.0–0.1)
Basophils Relative: 1 %
EOS PCT: 0 %
Eosinophils Absolute: 0 10*3/uL (ref 0.0–0.5)
HEMATOCRIT: 38.9 % (ref 36.0–46.0)
HEMOGLOBIN: 12.8 g/dL (ref 12.0–15.0)
Immature Granulocytes: 1 %
LYMPHS PCT: 32 %
Lymphs Abs: 4 10*3/uL (ref 0.7–4.0)
MCH: 30.4 pg (ref 26.0–34.0)
MCHC: 32.9 g/dL (ref 30.0–36.0)
MCV: 92.4 fL (ref 80.0–100.0)
MONO ABS: 0.9 10*3/uL (ref 0.1–1.0)
MONOS PCT: 7 %
Neutro Abs: 7.5 10*3/uL (ref 1.7–7.7)
Neutrophils Relative %: 59 %
Platelet Count: 9 10*3/uL — CL (ref 150–400)
RBC: 4.21 MIL/uL (ref 3.87–5.11)
RDW: 13.6 % (ref 11.5–15.5)
WBC Count: 12.6 10*3/uL — ABNORMAL HIGH (ref 4.0–10.5)
nRBC: 0.2 % (ref 0.0–0.2)

## 2018-08-09 LAB — PLATELET BY CITRATE

## 2018-08-09 NOTE — Telephone Encounter (Signed)
Patient states she woke up this morning with a mouth full of blood. She has also noticed some red spots on her body.She states that the bleeding has slowed, but she still feels like there is some oozing in her mouth.  Patient is scheduled to come in for lab work. She knows to wait in the lobby for results.

## 2018-08-09 NOTE — Telephone Encounter (Signed)
Critical Value Platelets 9 Dr Maylon Peppers notified. He will see patient.

## 2018-08-09 NOTE — Progress Notes (Signed)
[place] OFFICE PROGRESS NOTE  Patient Care Team: Janice Rail, MD as PCP - General (Internal Medicine) Janice Napoleon, MD (Hematology and Oncology) Alden Hipp, MD (Obstetrics and Gynecology) Janice Males, MD (Pulmonary Disease)  Principle Diagnosis:   Chronic immune thrombocytopenia-recurrent  Submassive pulmonary embolism, with right lower extremity thrombus in September 2016  Current Therapy:    Xarelto 10 mg by mouth daily  Decadron-taper per protocol for relapsed thrombocytopenia (hx of splenectomy in 1986 and rituximab x 2, last in October 2014)  ASSESSMENT & PLAN:  Recurrent ITP -Patient reports new onset mild mucosal bleeding since this morning, which has resolved; scattered small petechiae over the upper extremities; no evidence of significant bleeding, such as hematuria, hematochezia, or melena -I reviewed the patient's history of recurrent chronic ITP; she has had several recurrent episodes this year, requiring pulse dose steroids. -She had splenectomy in 1986 for ITP, and has had 2 courses of rituximab, last in October 2014 -Platelets 9K today; I instructed the patient to take her pulse steroid per instruction by Dr. Marin Olp -In the absence of persistent or worsening bleeding, there is no indication for platelet transfusion or IVIG at this time -We will bring the patient back for lab check next week to assess platelet response; depending on her platelet count, additional treatment options to consider include repeating rituximab, Nplate/Promacta, and fostamatinib -In anticipation of possible repeat rituximab, I have ordered hepatitis B profile for the next visit -I discussed with patient regarding concerning symptoms, such as significant persistent bleeding or bruising, for which she should contact the clinic or go to ER for further evaluation  History of submassive PTE and RLE DVT -Patient has history of saddle embolus and lower extremity  DVT in 2016 -She is currently on prophylactic Xarelto 10 mg daily -Given the recurrent ITP, I have instructed the patient to hold her anticoagulation until further instruction due to the risk of exacerbating bleeding  Orders Placed This Encounter  Procedures  . Hepatitis B core antibody, total    Standing Status:   Future    Standing Expiration Date:   09/13/2019  . Hepatitis B surface antibody,qualitative    Standing Status:   Future    Standing Expiration Date:   09/13/2019  . Hepatitis B surface antigen    Standing Status:   Future    Standing Expiration Date:   09/13/2019   All questions were answered. The patient knows to call the clinic with any problems, questions or concerns. No barriers to learning was detected.  A total of more than 25 minutes were spent face-to-face with the patient during this encounter and over half of that time was spent on counseling and coordination of care as outlined above.   Janice Men, MD 08/09/2018 1:44 PM  CHIEF COMPLAINT: "I am here for mouth bleeding"  INTERVAL HISTORY: Janice Martin presents to the hematology clinic for evaluation of oral mucosal bleeding, concerning for recurrent ITP.  Patient reports that she woke up this morning and noticed traces of blood over her pillowcase and bed sheets.  Given her history of recurrent ITP, she was concerned that her platelet count might be low again, and she contacted the clinic for blood work.  Her CBC showed platelet count of 9000.  She reports that since this morning, the mucosal bleeding has nearly completely resolved.  She denies any recent infection, medication change, hematemesis, hemoptysis, hematuria, hematochezia or melena.  She took her last dose of Xarelto last night, but did  not take her morning dose.  REVIEW OF SYSTEMS:   Constitutional: ( - ) fevers, ( - )  chills , ( - ) night sweats Eyes: ( - ) blurriness of vision, ( - ) double vision, ( - ) watery eyes Ears, nose, mouth, throat, and face:  ( - ) mucositis, ( - ) sore throat Respiratory: ( - ) cough, ( - ) dyspnea, ( - ) wheezes Cardiovascular: ( - ) palpitation, ( - ) chest discomfort, ( - ) lower extremity swelling Gastrointestinal:  ( - ) nausea, ( - ) heartburn, ( - ) change in bowel habits Skin: ( - ) abnormal skin rashes Lymphatics: ( - ) new lymphadenopathy, ( - ) easy bruising Neurological: ( - ) numbness, ( - ) tingling, ( - ) new weaknesses Behavioral/Psych: ( - ) mood change, ( - ) new changes  All other systems were reviewed with the patient and are negative.  I have reviewed the past medical history, past surgical history, social history and family history with the patient and they are unchanged from previous note.  ALLERGIES:  is allergic to bee venom; influenza vaccines; morphine; and shellfish allergy.  MEDICATIONS:  Current Outpatient Medications  Medication Sig Dispense Refill  . BYSTOLIC 5 MG tablet TAKE 1 TABLET (5 MG TOTAL) BY MOUTH DAILY. 90 tablet 1  . Rivaroxaban (XARELTO) 10 MG TABS tablet Take 1 tablet (10 mg total) by mouth daily with supper. 30 tablet 12  . valsartan-hydrochlorothiazide (DIOVAN-HCT) 320-25 MG tablet Take 1 tablet by mouth daily. (Patient taking differently: Take 1 tablet by mouth at bedtime. ) 90 tablet 1  . vitamin B-12 (CYANOCOBALAMIN) 100 MCG tablet Take 100 mcg by mouth at bedtime.      No current facility-administered medications for this visit.     PHYSICAL EXAMINATION: ECOG PERFORMANCE STATUS: 1 - Symptomatic but completely ambulatory  Today's Vitals   08/09/18 1220 08/09/18 1222  BP:  (!) 170/89  Pulse:  88  Resp:  18  Temp:  97.9 F (36.6 C)  TempSrc:  Oral  SpO2:  100%  Weight:  185 lb 1.9 oz (84 kg)  Height:  5\' 4"  (1.626 m)  PainSc: 0-No pain 0-No pain   Body mass index is 31.78 kg/m.  Filed Weights   08/09/18 1222  Weight: 185 lb 1.9 oz (84 kg)   GENERAL: alert, no distress and comfortable SKIN: occasional petechiae over the extremities, no  significant bruising EYES: conjunctiva are pink and non-injected, sclera clear OROPHARYNX: trace of dried blood over the right posterior oropharynx, no evidence of active bleeding  NECK: supple, non-tender LYMPH:  no palpable lymphadenopathy in the cervical or axillary  LUNGS: clear to auscultation and percussion with normal breathing effort HEART: regular rate & rhythm and no murmurs and no lower extremity edema ABDOMEN: soft, non-tender, non-distended, normal bowel sounds Musculoskeletal: no cyanosis of digits and no clubbing  PSYCH: alert & oriented x 3, fluent speech NEURO: no focal motor/sensory deficits  LABORATORY DATA:  I have reviewed the data as listed    Component Value Date/Time   NA 140 07/19/2018 1206   NA 140 09/17/2017 1507   K 4.7 07/19/2018 1206   K 3.9 09/17/2017 1507   CL 103 07/19/2018 1206   CL 99 04/17/2017 1411   CO2 31 07/19/2018 1206   CO2 30 (H) 09/17/2017 1507   GLUCOSE 95 07/19/2018 1206   GLUCOSE 94 09/17/2017 1507   GLUCOSE 89 04/17/2017 1411   BUN 15 07/19/2018  1206   BUN 8.9 09/17/2017 1507   CREATININE 0.84 07/19/2018 1206   CREATININE 1.2 (H) 09/17/2017 1507   CALCIUM 9.1 07/19/2018 1206   CALCIUM 9.3 09/17/2017 1507   PROT 6.4 (L) 07/19/2018 1206   PROT 7.2 09/17/2017 1507   ALBUMIN 3.4 (L) 07/19/2018 1206   ALBUMIN 3.7 09/17/2017 1507   AST 13 (L) 07/19/2018 1206   AST 15 09/17/2017 1507   ALT 21 07/19/2018 1206   ALT 18 09/17/2017 1507   ALKPHOS 100 07/19/2018 1206   ALKPHOS 105 09/17/2017 1507   BILITOT 0.3 07/19/2018 1206   BILITOT 0.38 09/17/2017 1507   GFRNONAA >60 07/19/2018 1206   GFRAA >60 07/19/2018 1206    No results found for: SPEP, UPEP  Lab Results  Component Value Date   WBC 12.6 (H) 08/09/2018   NEUTROABS 7.5 08/09/2018   HGB 12.8 08/09/2018   HCT 38.9 08/09/2018   MCV 92.4 08/09/2018   PLT 9 (LL) 08/09/2018      Chemistry      Component Value Date/Time   NA 140 07/19/2018 1206   NA 140 09/17/2017  1507   K 4.7 07/19/2018 1206   K 3.9 09/17/2017 1507   CL 103 07/19/2018 1206   CL 99 04/17/2017 1411   CO2 31 07/19/2018 1206   CO2 30 (H) 09/17/2017 1507   BUN 15 07/19/2018 1206   BUN 8.9 09/17/2017 1507   CREATININE 0.84 07/19/2018 1206   CREATININE 1.2 (H) 09/17/2017 1507      Component Value Date/Time   CALCIUM 9.1 07/19/2018 1206   CALCIUM 9.3 09/17/2017 1507   ALKPHOS 100 07/19/2018 1206   ALKPHOS 105 09/17/2017 1507   AST 13 (L) 07/19/2018 1206   AST 15 09/17/2017 1507   ALT 21 07/19/2018 1206   ALT 18 09/17/2017 1507   BILITOT 0.3 07/19/2018 1206   BILITOT 0.38 09/17/2017 1507

## 2018-08-13 ENCOUNTER — Other Ambulatory Visit: Payer: Self-pay | Admitting: Family

## 2018-08-16 ENCOUNTER — Other Ambulatory Visit: Payer: Self-pay | Admitting: Family

## 2018-08-17 ENCOUNTER — Inpatient Hospital Stay: Payer: 59

## 2018-08-17 ENCOUNTER — Inpatient Hospital Stay (HOSPITAL_BASED_OUTPATIENT_CLINIC_OR_DEPARTMENT_OTHER): Payer: 59 | Admitting: Hematology & Oncology

## 2018-08-17 ENCOUNTER — Other Ambulatory Visit: Payer: Self-pay

## 2018-08-17 VITALS — BP 150/89 | HR 63 | Temp 98.0°F | Resp 18 | Wt 185.8 lb

## 2018-08-17 DIAGNOSIS — I825Z1 Chronic embolism and thrombosis of unspecified deep veins of right distal lower extremity: Secondary | ICD-10-CM

## 2018-08-17 DIAGNOSIS — D693 Immune thrombocytopenic purpura: Secondary | ICD-10-CM | POA: Diagnosis not present

## 2018-08-17 DIAGNOSIS — Z86711 Personal history of pulmonary embolism: Secondary | ICD-10-CM | POA: Diagnosis not present

## 2018-08-17 DIAGNOSIS — Z7901 Long term (current) use of anticoagulants: Secondary | ICD-10-CM | POA: Diagnosis not present

## 2018-08-17 DIAGNOSIS — Z86718 Personal history of other venous thrombosis and embolism: Secondary | ICD-10-CM | POA: Diagnosis not present

## 2018-08-17 LAB — CBC WITH DIFFERENTIAL (CANCER CENTER ONLY)
Abs Immature Granulocytes: 0.85 10*3/uL — ABNORMAL HIGH (ref 0.00–0.07)
BASOS PCT: 0 %
Basophils Absolute: 0.1 10*3/uL (ref 0.0–0.1)
EOS ABS: 0 10*3/uL (ref 0.0–0.5)
EOS PCT: 0 %
HEMATOCRIT: 41.5 % (ref 36.0–46.0)
Hemoglobin: 13.7 g/dL (ref 12.0–15.0)
Immature Granulocytes: 4 %
Lymphocytes Relative: 23 %
Lymphs Abs: 5 10*3/uL — ABNORMAL HIGH (ref 0.7–4.0)
MCH: 30.9 pg (ref 26.0–34.0)
MCHC: 33 g/dL (ref 30.0–36.0)
MCV: 93.5 fL (ref 80.0–100.0)
MONOS PCT: 7 %
Monocytes Absolute: 1.5 10*3/uL — ABNORMAL HIGH (ref 0.1–1.0)
NRBC: 0.5 % — AB (ref 0.0–0.2)
Neutro Abs: 14.7 10*3/uL — ABNORMAL HIGH (ref 1.7–7.7)
Neutrophils Relative %: 66 %
PLATELETS: 321 10*3/uL (ref 150–400)
RBC: 4.44 MIL/uL (ref 3.87–5.11)
RDW: 14.6 % (ref 11.5–15.5)
WBC Count: 22.1 10*3/uL — ABNORMAL HIGH (ref 4.0–10.5)

## 2018-08-17 NOTE — Progress Notes (Signed)
Hematology and Oncology Follow Up Visit  Janice Martin [id number] July 31, 1961 57 y.o. 08/17/2018   Principle Diagnosis:   Chronic immune thrombocytopenia-recurrent  Submassive pulmonary embolism, with right lower extremity thrombus  Current Therapy:    Xarelto 10 mg by mouth daily   Decadron-taper per protocol for relapsed thrombocytopenia     Interim History:  Janice Martin is back for followup.   Unfortunately, she is having more frequent relapses.  This is quite concerning.  She takes Decadron whenever the relapses hit.  The Decadron works very nicely.  However, she really would prefer not to take Decadron.  We talked about Nplate.  This certainly would be very reasonable.  She is had no bleeding.  She is had no nausea or vomiting.  She is had no cough.  She is had some bruising.  Overall, her performance status is ECOG 1.  She is still working in radiation oncology.  She has had no cough.  She is had no change in bowel or bladder habits.  She does not notice any hematuria.  Is been no melena or bright red blood per rectum.  Medications:  Current Outpatient Medications:  .  BYSTOLIC 5 MG tablet, TAKE 1 TABLET (5 MG TOTAL) BY MOUTH DAILY., Disp: 90 tablet, Rfl: 1 .  Rivaroxaban (XARELTO) 10 MG TABS tablet, Take 1 tablet (10 mg total) by mouth daily with supper., Disp: 30 tablet, Rfl: 12 .  valsartan-hydrochlorothiazide (DIOVAN-HCT) 320-25 MG tablet, Take 1 tablet by mouth daily. (Patient taking differently: Take 1 tablet by mouth at bedtime. ), Disp: 90 tablet, Rfl: 1 .  vitamin B-12 (CYANOCOBALAMIN) 100 MCG tablet, Take 100 mcg by mouth at bedtime. , Disp: , Rfl:   Allergies:  Allergies  Allergen Reactions  . Bee Venom Anaphylaxis, Shortness Of Breath and Swelling  . Influenza Vaccines Other (See Comments)    Per pt can't take mess up with her immune system  . Morphine Other (See Comments)    Burns really bad  . Shellfish Allergy Anaphylaxis    Past Medical History,  Surgical history, Social history, and Family History were reviewed and updated.  Review of Systems: Review of Systems  Constitutional: Negative.   HENT: Negative.   Eyes: Negative.   Respiratory: Negative.   Cardiovascular: Negative.   Gastrointestinal: Negative.   Genitourinary: Negative.   Musculoskeletal: Negative.   Skin: Negative.   Neurological: Negative.   Endo/Heme/Allergies: Negative.   Psychiatric/Behavioral: Negative.     Physical Exam:  weight is 185 lb 12.8 oz (84.3 kg). Her oral temperature is 98 F (36.7 C). Her blood pressure is 150/89 (abnormal) and her pulse is 63. Her respiration is 18 and oxygen saturation is 99%.   Physical Exam  Constitutional: She is oriented to person, place, and time.  HENT:  Head: Normocephalic and atraumatic.  Mouth/Throat: Oropharynx is clear and moist.  Eyes: Pupils are equal, round, and reactive to light. EOM are normal.  Neck: Normal range of motion.  Cardiovascular: Normal rate, regular rhythm and normal heart sounds.  Pulmonary/Chest: Effort normal and breath sounds normal.  Abdominal: Soft. Bowel sounds are normal.  She has well-healed splenectomy scar.  No fluid wave.  No palpable liver.  No guarding or rebound tenderness.  Musculoskeletal: Normal range of motion. She exhibits no edema, tenderness or deformity.  Lymphadenopathy:    She has no cervical adenopathy.  Neurological: She is alert and oriented to person, place, and time.  Skin: Skin is warm and dry. No rash noted.  No erythema.  Psychiatric: She has a normal mood and affect. Her behavior is normal. Judgment and thought content normal.  Vitals reviewed.   Lab Results  Component Value Date   WBC 22.1 (H) 08/17/2018   HGB 13.7 08/17/2018   HCT 41.5 08/17/2018   MCV 93.5 08/17/2018   PLT 321 08/17/2018     Chemistry      Component Value Date/Time   NA 140 07/19/2018 1206   NA 140 09/17/2017 1507   K 4.7 07/19/2018 1206   K 3.9 09/17/2017 1507   CL 103  07/19/2018 1206   CL 99 04/17/2017 1411   CO2 31 07/19/2018 1206   CO2 30 (H) 09/17/2017 1507   BUN 15 07/19/2018 1206   BUN 8.9 09/17/2017 1507   CREATININE 0.84 07/19/2018 1206   CREATININE 1.2 (H) 09/17/2017 1507      Component Value Date/Time   CALCIUM 9.1 07/19/2018 1206   CALCIUM 9.3 09/17/2017 1507   ALKPHOS 100 07/19/2018 1206   ALKPHOS 105 09/17/2017 1507   AST 13 (L) 07/19/2018 1206   AST 15 09/17/2017 1507   ALT 21 07/19/2018 1206   ALT 18 09/17/2017 1507   BILITOT 0.3 07/19/2018 1206   BILITOT 0.38 09/17/2017 1507       Impression and Plan: Janice Martin is a 57 year old white female. She has a history of relapsed chronic immune thrombocytopenia.   The relapses are becoming more frequent.  We are going to have to be cognizant of this.  I cannot have her keep taking steroids.  This will ultimately affect her bones and lead to osteoporosis and possible fractures.  We will have to watch her blood counts weekly for right now.  If you start to see that her platelet count is starting to go down, we will get her on endplate.  I think this would be easiest for her.  I would like to see her back in 5-6 weeks.      [name], MD 10/29/201912:53 PM

## 2018-08-18 LAB — HEPATITIS B CORE ANTIBODY, TOTAL: Hep B Core Total Ab: NEGATIVE

## 2018-08-18 LAB — HEPATITIS B SURFACE ANTIGEN: Hepatitis B Surface Ag: NEGATIVE

## 2018-08-18 LAB — HEPATITIS B SURFACE ANTIBODY,QUALITATIVE: Hep B S Ab: NONREACTIVE

## 2018-08-24 ENCOUNTER — Inpatient Hospital Stay: Payer: 59 | Attending: Hematology & Oncology

## 2018-08-24 DIAGNOSIS — D693 Immune thrombocytopenic purpura: Secondary | ICD-10-CM | POA: Diagnosis not present

## 2018-08-24 LAB — CBC WITH DIFFERENTIAL (CANCER CENTER ONLY)
Abs Immature Granulocytes: 0.11 10*3/uL — ABNORMAL HIGH (ref 0.00–0.07)
BASOS ABS: 0.1 10*3/uL (ref 0.0–0.1)
Basophils Relative: 1 %
EOS ABS: 0.2 10*3/uL (ref 0.0–0.5)
Eosinophils Relative: 1 %
HEMATOCRIT: 40.9 % (ref 36.0–46.0)
Hemoglobin: 13.6 g/dL (ref 12.0–15.0)
IMMATURE GRANULOCYTES: 1 %
LYMPHS ABS: 5.5 10*3/uL — AB (ref 0.7–4.0)
Lymphocytes Relative: 31 %
MCH: 31 pg (ref 26.0–34.0)
MCHC: 33.3 g/dL (ref 30.0–36.0)
MCV: 93.2 fL (ref 80.0–100.0)
Monocytes Absolute: 1.5 10*3/uL — ABNORMAL HIGH (ref 0.1–1.0)
Monocytes Relative: 9 %
NEUTROS PCT: 57 %
NRBC: 0 % (ref 0.0–0.2)
Neutro Abs: 10.4 10*3/uL — ABNORMAL HIGH (ref 1.7–7.7)
PLATELETS: 386 10*3/uL (ref 150–400)
RBC: 4.39 MIL/uL (ref 3.87–5.11)
RDW: 15 % (ref 11.5–15.5)
WBC Count: 17.8 10*3/uL — ABNORMAL HIGH (ref 4.0–10.5)

## 2018-08-31 ENCOUNTER — Inpatient Hospital Stay: Payer: 59

## 2018-08-31 DIAGNOSIS — D693 Immune thrombocytopenic purpura: Secondary | ICD-10-CM

## 2018-08-31 LAB — CBC WITH DIFFERENTIAL (CANCER CENTER ONLY)
ABS IMMATURE GRANULOCYTES: 0.03 10*3/uL (ref 0.00–0.07)
BASOS ABS: 0.1 10*3/uL (ref 0.0–0.1)
BASOS PCT: 1 %
Eosinophils Absolute: 0.1 10*3/uL (ref 0.0–0.5)
Eosinophils Relative: 1 %
HEMATOCRIT: 35.5 % — AB (ref 36.0–46.0)
Hemoglobin: 11.8 g/dL — ABNORMAL LOW (ref 12.0–15.0)
IMMATURE GRANULOCYTES: 0 %
LYMPHS PCT: 30 %
Lymphs Abs: 4.2 10*3/uL — ABNORMAL HIGH (ref 0.7–4.0)
MCH: 31.3 pg (ref 26.0–34.0)
MCHC: 33.2 g/dL (ref 30.0–36.0)
MCV: 94.2 fL (ref 80.0–100.0)
Monocytes Absolute: 1 10*3/uL (ref 0.1–1.0)
Monocytes Relative: 7 %
NEUTROS ABS: 8.6 10*3/uL — AB (ref 1.7–7.7)
NEUTROS PCT: 61 %
Platelet Count: 260 10*3/uL (ref 150–400)
RBC: 3.77 MIL/uL — AB (ref 3.87–5.11)
RDW: 14.3 % (ref 11.5–15.5)
WBC: 14.1 10*3/uL — AB (ref 4.0–10.5)
nRBC: 0 % (ref 0.0–0.2)

## 2018-09-07 ENCOUNTER — Inpatient Hospital Stay: Payer: 59

## 2018-09-07 DIAGNOSIS — D693 Immune thrombocytopenic purpura: Secondary | ICD-10-CM | POA: Diagnosis not present

## 2018-09-07 LAB — CBC WITH DIFFERENTIAL (CANCER CENTER ONLY)
Abs Immature Granulocytes: 0.08 10*3/uL — ABNORMAL HIGH (ref 0.00–0.07)
BASOS ABS: 0.1 10*3/uL (ref 0.0–0.1)
BASOS PCT: 1 %
EOS ABS: 0.4 10*3/uL (ref 0.0–0.5)
EOS PCT: 4 %
HCT: 38.6 % (ref 36.0–46.0)
Hemoglobin: 13 g/dL (ref 12.0–15.0)
IMMATURE GRANULOCYTES: 1 %
LYMPHS ABS: 4.2 10*3/uL — AB (ref 0.7–4.0)
Lymphocytes Relative: 41 %
MCH: 31.3 pg (ref 26.0–34.0)
MCHC: 33.7 g/dL (ref 30.0–36.0)
MCV: 93 fL (ref 80.0–100.0)
Monocytes Absolute: 0.9 10*3/uL (ref 0.1–1.0)
Monocytes Relative: 9 %
NEUTROS PCT: 44 %
NRBC: 0 % (ref 0.0–0.2)
Neutro Abs: 4.7 10*3/uL (ref 1.7–7.7)
PLATELETS: 68 10*3/uL — AB (ref 150–400)
RBC: 4.15 MIL/uL (ref 3.87–5.11)
RDW: 14 % (ref 11.5–15.5)
WBC: 10.4 10*3/uL (ref 4.0–10.5)

## 2018-09-08 ENCOUNTER — Other Ambulatory Visit: Payer: Self-pay | Admitting: *Deleted

## 2018-09-08 ENCOUNTER — Telehealth: Payer: Self-pay | Admitting: Hematology & Oncology

## 2018-09-08 DIAGNOSIS — D693 Immune thrombocytopenic purpura: Secondary | ICD-10-CM

## 2018-09-08 NOTE — Telephone Encounter (Signed)
Spoke with pt to confirm lab only appt 09/10/18 at 1145 am per sch msg

## 2018-09-10 ENCOUNTER — Inpatient Hospital Stay: Payer: 59

## 2018-09-10 DIAGNOSIS — D693 Immune thrombocytopenic purpura: Secondary | ICD-10-CM

## 2018-09-10 LAB — CBC WITH DIFFERENTIAL (CANCER CENTER ONLY)
ABS IMMATURE GRANULOCYTES: 0.34 10*3/uL — AB (ref 0.00–0.07)
BASOS PCT: 0 %
Basophils Absolute: 0.1 10*3/uL (ref 0.0–0.1)
EOS PCT: 0 %
Eosinophils Absolute: 0 10*3/uL (ref 0.0–0.5)
HCT: 39 % (ref 36.0–46.0)
HEMOGLOBIN: 13.4 g/dL (ref 12.0–15.0)
Immature Granulocytes: 2 %
Lymphocytes Relative: 12 %
Lymphs Abs: 2.6 10*3/uL (ref 0.7–4.0)
MCH: 31.2 pg (ref 26.0–34.0)
MCHC: 34.4 g/dL (ref 30.0–36.0)
MCV: 90.9 fL (ref 80.0–100.0)
MONO ABS: 0.8 10*3/uL (ref 0.1–1.0)
MONOS PCT: 4 %
NEUTROS ABS: 17.7 10*3/uL — AB (ref 1.7–7.7)
Neutrophils Relative %: 82 %
PLATELETS: 156 10*3/uL (ref 150–400)
RBC: 4.29 MIL/uL (ref 3.87–5.11)
RDW: 13.9 % (ref 11.5–15.5)
WBC: 21.5 10*3/uL — AB (ref 4.0–10.5)
nRBC: 0.2 % (ref 0.0–0.2)

## 2018-09-14 ENCOUNTER — Inpatient Hospital Stay: Payer: 59

## 2018-09-14 DIAGNOSIS — D693 Immune thrombocytopenic purpura: Secondary | ICD-10-CM | POA: Diagnosis not present

## 2018-09-14 LAB — CBC WITH DIFFERENTIAL (CANCER CENTER ONLY)
ABS IMMATURE GRANULOCYTES: 0.69 10*3/uL — AB (ref 0.00–0.07)
Basophils Absolute: 0.1 10*3/uL (ref 0.0–0.1)
Basophils Relative: 0 %
Eosinophils Absolute: 0 10*3/uL (ref 0.0–0.5)
Eosinophils Relative: 0 %
HCT: 43.1 % (ref 36.0–46.0)
HEMOGLOBIN: 14.7 g/dL (ref 12.0–15.0)
IMMATURE GRANULOCYTES: 3 %
LYMPHS PCT: 22 %
Lymphs Abs: 5.2 10*3/uL — ABNORMAL HIGH (ref 0.7–4.0)
MCH: 31.6 pg (ref 26.0–34.0)
MCHC: 34.1 g/dL (ref 30.0–36.0)
MCV: 92.7 fL (ref 80.0–100.0)
MONOS PCT: 5 %
Monocytes Absolute: 1.2 10*3/uL — ABNORMAL HIGH (ref 0.1–1.0)
NEUTROS ABS: 16.8 10*3/uL — AB (ref 1.7–7.7)
Neutrophils Relative %: 70 %
Platelet Count: 507 10*3/uL — ABNORMAL HIGH (ref 150–400)
RBC: 4.65 MIL/uL (ref 3.87–5.11)
RDW: 14.6 % (ref 11.5–15.5)
WBC Count: 24 10*3/uL — ABNORMAL HIGH (ref 4.0–10.5)
nRBC: 0.4 % — ABNORMAL HIGH (ref 0.0–0.2)

## 2018-09-20 ENCOUNTER — Inpatient Hospital Stay: Payer: 59

## 2018-09-20 ENCOUNTER — Ambulatory Visit: Payer: Self-pay | Admitting: Hematology & Oncology

## 2018-09-22 MED FILL — VALSARTAN-HCTZ 320-25 MG TA: 320-25 | 90 days supply | Qty: 90 | Fill #1

## 2018-09-22 MED FILL — BYSTOLIC 5 MG TABLET: 5 | 90 days supply | Qty: 90 | Fill #1

## 2018-09-24 ENCOUNTER — Inpatient Hospital Stay: Payer: 59 | Attending: Hematology & Oncology

## 2018-09-24 ENCOUNTER — Inpatient Hospital Stay (HOSPITAL_BASED_OUTPATIENT_CLINIC_OR_DEPARTMENT_OTHER): Payer: 59 | Admitting: Hematology & Oncology

## 2018-09-24 VITALS — BP 136/78 | HR 75 | Temp 97.7°F | Resp 18 | Ht 64.0 in | Wt 189.4 lb

## 2018-09-24 DIAGNOSIS — D693 Immune thrombocytopenic purpura: Secondary | ICD-10-CM | POA: Insufficient documentation

## 2018-09-24 DIAGNOSIS — Z7901 Long term (current) use of anticoagulants: Secondary | ICD-10-CM | POA: Diagnosis not present

## 2018-09-24 DIAGNOSIS — I2699 Other pulmonary embolism without acute cor pulmonale: Secondary | ICD-10-CM | POA: Insufficient documentation

## 2018-09-24 DIAGNOSIS — Z79899 Other long term (current) drug therapy: Secondary | ICD-10-CM

## 2018-09-24 LAB — CBC WITH DIFFERENTIAL (CANCER CENTER ONLY)
ABS IMMATURE GRANULOCYTES: 0.05 10*3/uL (ref 0.00–0.07)
BASOS ABS: 0.1 10*3/uL (ref 0.0–0.1)
Basophils Relative: 1 %
Eosinophils Absolute: 0.1 10*3/uL (ref 0.0–0.5)
Eosinophils Relative: 1 %
HCT: 38.9 % (ref 36.0–46.0)
HEMOGLOBIN: 12.6 g/dL (ref 12.0–15.0)
IMMATURE GRANULOCYTES: 0 %
LYMPHS ABS: 5.1 10*3/uL — AB (ref 0.7–4.0)
LYMPHS PCT: 37 %
MCH: 31.4 pg (ref 26.0–34.0)
MCHC: 32.4 g/dL (ref 30.0–36.0)
MCV: 97 fL (ref 80.0–100.0)
MONOS PCT: 11 %
Monocytes Absolute: 1.6 10*3/uL — ABNORMAL HIGH (ref 0.1–1.0)
NEUTROS PCT: 50 %
NRBC: 0 % (ref 0.0–0.2)
Neutro Abs: 7.1 10*3/uL (ref 1.7–7.7)
Platelet Count: 429 10*3/uL — ABNORMAL HIGH (ref 150–400)
RBC: 4.01 MIL/uL (ref 3.87–5.11)
RDW: 14.4 % (ref 11.5–15.5)
WBC Count: 14.1 10*3/uL — ABNORMAL HIGH (ref 4.0–10.5)

## 2018-09-24 NOTE — Progress Notes (Signed)
Hematology and Oncology Follow Up Visit  Janice Martin [id number] Jan 18, 1961 57 y.o. 09/24/2018   Principle Diagnosis:   Chronic immune thrombocytopenia-recurrent  Submassive pulmonary embolism, with right lower extremity thrombus  Current Therapy:    Xarelto 10 mg by mouth daily   Decadron-taper per protocol for relapsed thrombocytopenia     Interim History:  [name] is back for followup.   She is doing okay right now.  However, she did have a relapse.  She is been having relapses low bit more frequently.  The relapses responded very well to Decadron.  She has been off Decadron now for about 2 weeks or so.  She is under a lot of stress at work.  I do not know if this might be a reason for these relapses to be more frequent.  She has had no obvious bleeding.  Whenever she has her relapses, she has the petechia on her legs.  She has had no fever.  She is had no cough.  She has had no nausea or vomiting.  There has been no headache.  Overall, her performance status is ECOG 1.    Medications:  Current Outpatient Medications:  .  BYSTOLIC 5 MG tablet, TAKE 1 TABLET (5 MG TOTAL) BY MOUTH DAILY., Disp: 90 tablet, Rfl: 1 .  Rivaroxaban (XARELTO) 10 MG TABS tablet, Take 1 tablet (10 mg total) by mouth daily with supper., Disp: 30 tablet, Rfl: 12 .  valsartan-hydrochlorothiazide (DIOVAN-HCT) 320-25 MG tablet, Take 1 tablet by mouth daily. (Patient taking differently: Take 1 tablet by mouth at bedtime. ), Disp: 90 tablet, Rfl: 1 .  vitamin B-12 (CYANOCOBALAMIN) 100 MCG tablet, Take 100 mcg by mouth at bedtime. , Disp: , Rfl:   Allergies:  Allergies  Allergen Reactions  . Bee Venom Anaphylaxis, Shortness Of Breath and Swelling  . Influenza Vaccines Other (See Comments)    Per pt can't take mess up with her immune system  . Morphine Other (See Comments)    Burns really bad  . Shellfish Allergy Anaphylaxis    Past Medical History, Surgical history, Social history, and Family  History were reviewed and updated.  Review of Systems: Review of Systems  Constitutional: Negative.   HENT: Negative.   Eyes: Negative.   Respiratory: Negative.   Cardiovascular: Negative.   Gastrointestinal: Negative.   Genitourinary: Negative.   Musculoskeletal: Negative.   Skin: Negative.   Neurological: Negative.   Endo/Heme/Allergies: Negative.   Psychiatric/Behavioral: Negative.     Physical Exam:  height is 5\' 4"  (1.626 m) and weight is 189 lb 6.4 oz (85.9 kg). Her oral temperature is 97.7 F (36.5 C). Her blood pressure is 136/78 and her pulse is 75. Her respiration is 18 and oxygen saturation is 99%.   Physical Exam  Constitutional: She is oriented to person, place, and time.  HENT:  Head: Normocephalic and atraumatic.  Mouth/Throat: Oropharynx is clear and moist.  Eyes: Pupils are equal, round, and reactive to light. EOM are normal.  Neck: Normal range of motion.  Cardiovascular: Normal rate, regular rhythm and normal heart sounds.  Pulmonary/Chest: Effort normal and breath sounds normal.  Abdominal: Soft. Bowel sounds are normal.  She has well-healed splenectomy scar.  No fluid wave.  No palpable liver.  No guarding or rebound tenderness.  Musculoskeletal: Normal range of motion. She exhibits no edema, tenderness or deformity.  Lymphadenopathy:    She has no cervical adenopathy.  Neurological: She is alert and oriented to person, place, and time.  Skin: Skin is warm and dry. No rash noted. No erythema.  Psychiatric: She has a normal mood and affect. Her behavior is normal. Judgment and thought content normal.  Vitals reviewed.   Lab Results  Component Value Date   WBC 14.1 (H) 09/24/2018   HGB 12.6 09/24/2018   HCT 38.9 09/24/2018   MCV 97.0 09/24/2018   PLT 429 (H) 09/24/2018     Chemistry      Component Value Date/Time   NA 140 07/19/2018 1206   NA 140 09/17/2017 1507   K 4.7 07/19/2018 1206   K 3.9 09/17/2017 1507   CL 103 07/19/2018 1206   CL  99 04/17/2017 1411   CO2 31 07/19/2018 1206   CO2 30 (H) 09/17/2017 1507   BUN 15 07/19/2018 1206   BUN 8.9 09/17/2017 1507   CREATININE 0.84 07/19/2018 1206   CREATININE 1.2 (H) 09/17/2017 1507      Component Value Date/Time   CALCIUM 9.1 07/19/2018 1206   CALCIUM 9.3 09/17/2017 1507   ALKPHOS 100 07/19/2018 1206   ALKPHOS 105 09/17/2017 1507   AST 13 (L) 07/19/2018 1206   AST 15 09/17/2017 1507   ALT 21 07/19/2018 1206   ALT 18 09/17/2017 1507   BILITOT 0.3 07/19/2018 1206   BILITOT 0.38 09/17/2017 1507       Impression and Plan: Janice Martin is a 57 year old white female. She has a history of relapsed chronic immune thrombocytopenia.   I saw her blood under the microscope.  She had some Howell-Jolly bodies with some of the red cells.  I do not see any evidence that she has developed a new spleen.  Her platelets were quite prominent.  She had large platelets.  We will continue to follow her along.  She is blood once every week or 2.  She was knows to call us if she does have any problems.  She is well versed as to how to treat the relapses.  We can certainly consider Nplate for relapses if needed.  I would like to see her back in another 6 weeks.Marland Kitchen      [name], MD 12/6/20193:32 PM

## 2018-10-16 DIAGNOSIS — H524 Presbyopia: Secondary | ICD-10-CM | POA: Diagnosis not present

## 2018-10-22 MED FILL — DEXAMETHASONE 4 MG TABLET: 4 | 24 days supply | Qty: 84 | Fill #2

## 2018-11-04 ENCOUNTER — Telehealth: Payer: Self-pay | Admitting: Hematology & Oncology

## 2018-11-04 NOTE — Telephone Encounter (Signed)
LMVM for patient regarding appointments being r/s from 1/17 due to Dr Marin Olp being on call. I asked her to call back to confirm

## 2018-11-05 ENCOUNTER — Other Ambulatory Visit: Payer: Self-pay

## 2018-11-05 ENCOUNTER — Ambulatory Visit: Payer: Self-pay | Admitting: Hematology & Oncology

## 2018-11-08 ENCOUNTER — Other Ambulatory Visit: Payer: Self-pay | Admitting: Family

## 2018-11-12 MED FILL — XARELTO 10 MG TABLET: 10 | 90 days supply | Qty: 90 | Fill #1

## 2018-11-18 ENCOUNTER — Other Ambulatory Visit: Payer: Self-pay

## 2018-11-18 ENCOUNTER — Encounter: Payer: Self-pay | Admitting: Hematology & Oncology

## 2018-11-18 ENCOUNTER — Inpatient Hospital Stay (HOSPITAL_BASED_OUTPATIENT_CLINIC_OR_DEPARTMENT_OTHER): Payer: 59 | Admitting: Hematology & Oncology

## 2018-11-18 ENCOUNTER — Inpatient Hospital Stay: Payer: 59 | Attending: Hematology & Oncology

## 2018-11-18 VITALS — BP 140/79 | HR 90 | Temp 98.0°F | Resp 18 | Wt 191.0 lb

## 2018-11-18 DIAGNOSIS — I2699 Other pulmonary embolism without acute cor pulmonale: Secondary | ICD-10-CM

## 2018-11-18 DIAGNOSIS — Z7901 Long term (current) use of anticoagulants: Secondary | ICD-10-CM | POA: Diagnosis not present

## 2018-11-18 DIAGNOSIS — D693 Immune thrombocytopenic purpura: Secondary | ICD-10-CM | POA: Insufficient documentation

## 2018-11-18 DIAGNOSIS — Z79899 Other long term (current) drug therapy: Secondary | ICD-10-CM

## 2018-11-18 DIAGNOSIS — I825Z1 Chronic embolism and thrombosis of unspecified deep veins of right distal lower extremity: Secondary | ICD-10-CM

## 2018-11-18 LAB — CMP (CANCER CENTER ONLY)
ALT: 18 U/L (ref 0–44)
AST: 15 U/L (ref 15–41)
Albumin: 4.6 g/dL (ref 3.5–5.0)
Alkaline Phosphatase: 83 U/L (ref 38–126)
Anion gap: 9 (ref 5–15)
BUN: 9 mg/dL (ref 6–20)
CO2: 32 mmol/L (ref 22–32)
CREATININE: 0.97 mg/dL (ref 0.44–1.00)
Calcium: 10.1 mg/dL (ref 8.9–10.3)
Chloride: 100 mmol/L (ref 98–111)
GFR, Est AFR Am: >60 mL/min (ref >60)
GFR, Estimated: >60 mL/min (ref >60)
Glucose, Bld: 97 mg/dL (ref 70–99)
Potassium: 3.9 mmol/L (ref 3.5–5.1)
Sodium: 141 mmol/L (ref 135–145)
Total Bilirubin: 0.5 mg/dL (ref 0.3–1.2)
Total Protein: 7.3 g/dL (ref 6.5–8.1)

## 2018-11-18 LAB — CBC WITH DIFFERENTIAL (CANCER CENTER ONLY)
Abs Immature Granulocytes: 0.02 10*3/uL (ref 0.00–0.07)
Basophils Absolute: 0.2 10*3/uL — ABNORMAL HIGH (ref 0.0–0.1)
Basophils Relative: 2 %
EOS ABS: 0.1 10*3/uL (ref 0.0–0.5)
EOS PCT: 1 %
HEMATOCRIT: 38.5 % (ref 36.0–46.0)
HEMOGLOBIN: 13.1 g/dL (ref 12.0–15.0)
IMMATURE GRANULOCYTES: 0 %
LYMPHS ABS: 4 10*3/uL (ref 0.7–4.0)
LYMPHS PCT: 42 %
MCH: 32.1 pg (ref 26.0–34.0)
MCHC: 34 g/dL (ref 30.0–36.0)
MCV: 94.4 fL (ref 80.0–100.0)
Monocytes Absolute: 0.9 10*3/uL (ref 0.1–1.0)
Monocytes Relative: 9 %
NEUTROS PCT: 46 %
Neutro Abs: 4.3 10*3/uL (ref 1.7–7.7)
Platelet Count: 228 10*3/uL (ref 150–400)
RBC: 4.08 MIL/uL (ref 3.87–5.11)
RDW: 13.2 % (ref 11.5–15.5)
WBC Count: 9.4 10*3/uL (ref 4.0–10.5)
nRBC: 0 % (ref 0.0–0.2)

## 2018-11-18 LAB — SAVE SMEAR(SSMR), FOR PROVIDER SLIDE REVIEW

## 2018-11-18 LAB — PLATELET BY CITRATE

## 2018-11-18 NOTE — Progress Notes (Signed)
Hematology and Oncology Follow Up Visit  Janice Martin [id number] [birth year] 58 y.o. 11/18/2018   Principle Diagnosis:   Chronic immune thrombocytopenia-recurrent  Submassive pulmonary embolism, with right lower extremity thrombus  Current Therapy:    Xarelto 10 mg by mouth daily   Decadron-taper per protocol for relapsed thrombocytopenia     Interim History:  Janice Martin is back for followup.   She is doing pretty well.  She is still been stressed out at work.  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  Things are changing now there.  She is trying to keep up.  She had no problems over the holidays.  She has had no bleeding.  She is on low-dose Xarelto and doing well with this.  She has had no change in bowel or bladder habits.  There has been no leg swelling.  She has had no cough.  There is been no fevers.  Overall, her performance status is ECOG 1.    Medications:  Current Outpatient Medications:  .  BYSTOLIC 5 MG tablet, TAKE 1 TABLET (5 MG TOTAL) BY MOUTH DAILY., Disp: 90 tablet, Rfl: 1 .  Rivaroxaban (XARELTO) 10 MG TABS tablet, Take 1 tablet (10 mg total) by mouth daily with supper., Disp: 30 tablet, Rfl: 12 .  valsartan-hydrochlorothiazide (DIOVAN-HCT) 320-25 MG tablet, Take 1 tablet by mouth daily. (Patient taking differently: Take 1 tablet by mouth at bedtime. ), Disp: 90 tablet, Rfl: 1 .  vitamin B-12 (CYANOCOBALAMIN) 100 MCG tablet, Take 100 mcg by mouth at bedtime. , Disp: , Rfl:   Allergies:  Allergies  Allergen Reactions  . Bee Venom Anaphylaxis, Shortness Of Breath and Swelling  . Influenza Vaccines Other (See Comments)    Per pt can't take mess up with her immune system  . Morphine Other (See Comments)    Burns really bad  . Shellfish Allergy Anaphylaxis    Past Medical History, Surgical history, Social history, and Family History were reviewed and updated.  Review of Systems: Review of Systems  Constitutional: Negative.    HENT: Negative.   Eyes: Negative.   Respiratory: Negative.   Cardiovascular: Negative.   Gastrointestinal: Negative.   Genitourinary: Negative.   Musculoskeletal: Negative.   Skin: Negative.   Neurological: Negative.   Endo/Heme/Allergies: Negative.   Psychiatric/Behavioral: Negative.     Physical Exam:  weight is 191 lb (86.6 kg). Her oral temperature is 98 F (36.7 C). Her blood pressure is 140/79 and her pulse is 90. Her respiration is 18 and oxygen saturation is 97%.   Physical Exam Vitals signs reviewed.  HENT:     Head: Normocephalic and atraumatic.  Eyes:     Pupils: Pupils are equal, round, and reactive to light.  Neck:     Musculoskeletal: Normal range of motion.  Cardiovascular:     Rate and Rhythm: Normal rate and regular rhythm.     Heart sounds: Normal heart sounds.  Pulmonary:     Effort: Pulmonary effort is normal.     Breath sounds: Normal breath sounds.  Abdominal:     General: Bowel sounds are normal.     Palpations: Abdomen is soft.     Comments: She has well-healed splenectomy scar.  No fluid wave.  No palpable liver.  No guarding or rebound tenderness.  Musculoskeletal: Normal range of motion.        General: No tenderness or deformity.  Lymphadenopathy:     Cervical: No cervical adenopathy.  Skin:  General: Skin is warm and dry.     Findings: No erythema or rash.  Neurological:     Mental Status: She is alert and oriented to person, place, and time.  Psychiatric:        Behavior: Behavior normal.        Thought Content: Thought content normal.        Judgment: Judgment normal.     Lab Results  Component Value Date   WBC 9.4 11/18/2018   HGB 13.1 11/18/2018   HCT 38.5 11/18/2018   MCV 94.4 11/18/2018   PLT 228 11/18/2018     Chemistry      Component Value Date/Time   NA 140 07/19/2018 1206   NA 140 09/17/2017 1507   K 4.7 07/19/2018 1206   K 3.9 09/17/2017 1507   CL 103 07/19/2018 1206   CL 99 04/17/2017 1411   CO2 31  07/19/2018 1206   CO2 30 (H) 09/17/2017 1507   BUN 15 07/19/2018 1206   BUN 8.9 09/17/2017 1507   CREATININE 0.84 07/19/2018 1206   CREATININE 1.2 (H) 09/17/2017 1507      Component Value Date/Time   CALCIUM 9.1 07/19/2018 1206   CALCIUM 9.3 09/17/2017 1507   ALKPHOS 100 07/19/2018 1206   ALKPHOS 105 09/17/2017 1507   AST 13 (L) 07/19/2018 1206   AST 15 09/17/2017 1507   ALT 21 07/19/2018 1206   ALT 18 09/17/2017 1507   BILITOT 0.3 07/19/2018 1206   BILITOT 0.38 09/17/2017 1507       Impression and Plan: Janice Martin is a 58 year old white female. She has a history of relapsed chronic immune thrombocytopenia.   I saw her blood under the microscope.  She had some Howell-Jolly bodies with some of the red cells.  I do not see any evidence that she has developed a new spleen.  Her platelets were quite prominent.  She had large platelets.  We will continue to follow her along.  She like to come in every 2 months.  She always knows when her platelet count goes down as she develops petechia.  If this happens, she calls [place].     [name], MD 1/30/20203:50 PM

## 2018-11-25 ENCOUNTER — Other Ambulatory Visit: Payer: Self-pay | Admitting: *Deleted

## 2018-11-25 ENCOUNTER — Inpatient Hospital Stay: Payer: 59 | Attending: Hematology & Oncology

## 2018-11-25 DIAGNOSIS — Z7901 Long term (current) use of anticoagulants: Secondary | ICD-10-CM | POA: Diagnosis not present

## 2018-11-25 DIAGNOSIS — D693 Immune thrombocytopenic purpura: Secondary | ICD-10-CM

## 2018-11-25 DIAGNOSIS — I2699 Other pulmonary embolism without acute cor pulmonale: Secondary | ICD-10-CM | POA: Diagnosis not present

## 2018-11-25 DIAGNOSIS — I825Z1 Chronic embolism and thrombosis of unspecified deep veins of right distal lower extremity: Secondary | ICD-10-CM

## 2018-11-25 DIAGNOSIS — Z79899 Other long term (current) drug therapy: Secondary | ICD-10-CM | POA: Diagnosis not present

## 2018-11-25 LAB — SAVE SMEAR(SSMR), FOR PROVIDER SLIDE REVIEW

## 2018-11-25 LAB — CMP (CANCER CENTER ONLY)
ALT: 18 U/L (ref 0–44)
AST: 12 U/L — ABNORMAL LOW (ref 15–41)
Albumin: 4 g/dL (ref 3.5–5.0)
Alkaline Phosphatase: 100 U/L (ref 38–126)
Anion gap: 12 (ref 5–15)
BILIRUBIN TOTAL: 0.6 mg/dL (ref 0.3–1.2)
BUN: 11 mg/dL (ref 6–20)
CO2: 28 mmol/L (ref 22–32)
Calcium: 9.8 mg/dL (ref 8.9–10.3)
Chloride: 99 mmol/L (ref 98–111)
Creatinine: 1.01 mg/dL — ABNORMAL HIGH (ref 0.44–1.00)
GFR, Est AFR Am: >60 mL/min (ref >60)
GFR, Estimated: >60 mL/min (ref >60)
Glucose, Bld: 165 mg/dL — ABNORMAL HIGH (ref 70–99)
Potassium: 3.7 mmol/L (ref 3.5–5.1)
Sodium: 139 mmol/L (ref 135–145)
Total Protein: 7.4 g/dL (ref 6.5–8.1)

## 2018-11-25 LAB — CBC WITH DIFFERENTIAL (CANCER CENTER ONLY)
Abs Immature Granulocytes: 0.15 10*3/uL — ABNORMAL HIGH (ref 0.00–0.07)
BASOS ABS: 0 10*3/uL (ref 0.0–0.1)
Basophils Relative: 0 %
Eosinophils Absolute: 0 10*3/uL (ref 0.0–0.5)
Eosinophils Relative: 0 %
HCT: 38.8 % (ref 36.0–46.0)
Hemoglobin: 13.2 g/dL (ref 12.0–15.0)
Immature Granulocytes: 1 %
Lymphocytes Relative: 10 %
Lymphs Abs: 2.4 10*3/uL (ref 0.7–4.0)
MCH: 31.4 pg (ref 26.0–34.0)
MCHC: 34 g/dL (ref 30.0–36.0)
MCV: 92.2 fL (ref 80.0–100.0)
Monocytes Absolute: 1.1 10*3/uL — ABNORMAL HIGH (ref 0.1–1.0)
Monocytes Relative: 5 %
NRBC: 0.2 % (ref 0.0–0.2)
Neutro Abs: 19.5 10*3/uL — ABNORMAL HIGH (ref 1.7–7.7)
Neutrophils Relative %: 84 %
Platelet Count: 39 10*3/uL — ABNORMAL LOW (ref 150–400)
RBC: 4.21 MIL/uL (ref 3.87–5.11)
RDW: 13.2 % (ref 11.5–15.5)
WBC Count: 23.2 10*3/uL — ABNORMAL HIGH (ref 4.0–10.5)

## 2018-11-25 LAB — PLATELET BY CITRATE

## 2018-12-29 ENCOUNTER — Other Ambulatory Visit: Payer: Self-pay

## 2018-12-29 ENCOUNTER — Other Ambulatory Visit: Payer: Self-pay | Admitting: *Deleted

## 2018-12-29 ENCOUNTER — Inpatient Hospital Stay: Payer: 59 | Attending: Hematology & Oncology

## 2018-12-29 ENCOUNTER — Telehealth: Payer: Self-pay | Admitting: *Deleted

## 2018-12-29 DIAGNOSIS — D693 Immune thrombocytopenic purpura: Secondary | ICD-10-CM | POA: Insufficient documentation

## 2018-12-29 LAB — CBC WITH DIFFERENTIAL (CANCER CENTER ONLY)
Abs Immature Granulocytes: 0.21 10*3/uL — ABNORMAL HIGH (ref 0.00–0.07)
Basophils Absolute: 0.1 10*3/uL (ref 0.0–0.1)
Basophils Relative: 0 %
Eosinophils Absolute: 0 10*3/uL (ref 0.0–0.5)
Eosinophils Relative: 0 %
HCT: 39.2 % (ref 36.0–46.0)
Hemoglobin: 13.5 g/dL (ref 12.0–15.0)
Immature Granulocytes: 1 %
Lymphocytes Relative: 10 %
Lymphs Abs: 1.6 10*3/uL (ref 0.7–4.0)
MCH: 31.5 pg (ref 26.0–34.0)
MCHC: 34.4 g/dL (ref 30.0–36.0)
MCV: 91.6 fL (ref 80.0–100.0)
MONOS PCT: 1 %
Monocytes Absolute: 0.2 10*3/uL (ref 0.1–1.0)
Neutro Abs: 13.6 10*3/uL — ABNORMAL HIGH (ref 1.7–7.7)
Neutrophils Relative %: 88 %
PLATELETS: 30 10*3/uL — AB (ref 150–400)
RBC: 4.28 MIL/uL (ref 3.87–5.11)
RDW: 13.2 % (ref 11.5–15.5)
WBC Count: 15.6 10*3/uL — ABNORMAL HIGH (ref 4.0–10.5)
nRBC: 0.3 % — ABNORMAL HIGH (ref 0.0–0.2)

## 2018-12-29 NOTE — Telephone Encounter (Signed)
Patient states she developed her typical red dotted rash when her platelets are low. The rash appeared last evening. She has started her decadron protocol.   Informed Dr Marin Olp. He would like patient to have a CBC today. Orders placed and patient informed.

## 2019-01-04 ENCOUNTER — Other Ambulatory Visit: Payer: Self-pay | Admitting: Internal Medicine

## 2019-01-04 MED FILL — VALSARTAN-HCTZ 320-25 MG TA: 320-25 | 90 days supply | Qty: 90 | Fill #2

## 2019-01-04 MED FILL — BYSTOLIC 5 MG TABLET: 5 | 90 days supply | Qty: 90 | Fill #0

## 2019-01-13 ENCOUNTER — Encounter: Payer: Self-pay | Admitting: Internal Medicine

## 2019-01-13 ENCOUNTER — Telehealth (INDEPENDENT_AMBULATORY_CARE_PROVIDER_SITE_OTHER): Payer: Self-pay | Admitting: Nurse Practitioner

## 2019-01-13 DIAGNOSIS — R059 Cough, unspecified: Secondary | ICD-10-CM

## 2019-01-13 DIAGNOSIS — R05 Cough: Secondary | ICD-10-CM

## 2019-01-13 MED ORDER — PROMETHAZINE-DM 6.25-15 MG/5ML PO SYRP: 5.0000 mL | ORAL_SOLUTION | Freq: Four times a day (QID) | ORAL | 0 refills | Status: DC | PRN

## 2019-01-13 NOTE — Progress Notes (Signed)

## 2019-01-13 NOTE — Addendum Note (Signed)
Addended by: Cari Caraway on: 01/13/2019 09:34 AM   Modules accepted: Orders

## 2019-01-14 ENCOUNTER — Inpatient Hospital Stay: Payer: 59 | Admitting: Hematology & Oncology

## 2019-01-14 ENCOUNTER — Inpatient Hospital Stay: Payer: 59

## 2019-01-31 ENCOUNTER — Inpatient Hospital Stay: Payer: 59 | Attending: Hematology & Oncology

## 2019-01-31 ENCOUNTER — Other Ambulatory Visit: Payer: Self-pay

## 2019-01-31 ENCOUNTER — Other Ambulatory Visit: Payer: Self-pay | Admitting: *Deleted

## 2019-01-31 ENCOUNTER — Telehealth: Payer: Self-pay | Admitting: *Deleted

## 2019-01-31 ENCOUNTER — Other Ambulatory Visit: Payer: Self-pay | Admitting: Hematology & Oncology

## 2019-01-31 DIAGNOSIS — Z79899 Other long term (current) drug therapy: Secondary | ICD-10-CM | POA: Insufficient documentation

## 2019-01-31 DIAGNOSIS — D693 Immune thrombocytopenic purpura: Secondary | ICD-10-CM

## 2019-01-31 DIAGNOSIS — Z7901 Long term (current) use of anticoagulants: Secondary | ICD-10-CM | POA: Diagnosis not present

## 2019-01-31 DIAGNOSIS — I2699 Other pulmonary embolism without acute cor pulmonale: Secondary | ICD-10-CM | POA: Insufficient documentation

## 2019-01-31 DIAGNOSIS — I825Z1 Chronic embolism and thrombosis of unspecified deep veins of right distal lower extremity: Secondary | ICD-10-CM

## 2019-01-31 LAB — CBC WITH DIFFERENTIAL (CANCER CENTER ONLY)
Abs Immature Granulocytes: 0.18 10*3/uL — ABNORMAL HIGH (ref 0.00–0.07)
Basophils Absolute: 0.2 10*3/uL — ABNORMAL HIGH (ref 0.0–0.1)
Basophils Relative: 1 %
Eosinophils Absolute: 0 10*3/uL (ref 0.0–0.5)
Eosinophils Relative: 0 %
HCT: 40.3 % (ref 36.0–46.0)
Hemoglobin: 13.5 g/dL (ref 12.0–15.0)
Immature Granulocytes: 1 %
Lymphocytes Relative: 23 %
Lymphs Abs: 3.8 10*3/uL (ref 0.7–4.0)
MCH: 31.8 pg (ref 26.0–34.0)
MCHC: 33.5 g/dL (ref 30.0–36.0)
MCV: 94.8 fL (ref 80.0–100.0)
Monocytes Absolute: 1.3 10*3/uL — ABNORMAL HIGH (ref 0.1–1.0)
Monocytes Relative: 8 %
Neutro Abs: 11.3 10*3/uL — ABNORMAL HIGH (ref 1.7–7.7)
Neutrophils Relative %: 67 %
Platelet Count: 65 10*3/uL — ABNORMAL LOW (ref 150–400)
RBC: 4.25 MIL/uL (ref 3.87–5.11)
RDW: 13.8 % (ref 11.5–15.5)
WBC Count: 16.9 10*3/uL — ABNORMAL HIGH (ref 4.0–10.5)
nRBC: 0.5 % — ABNORMAL HIGH (ref 0.0–0.2)

## 2019-01-31 MED ORDER — DEXAMETHASONE 4 MG PO TABS: ORAL_TABLET | ORAL | 2 refills | Status: DC

## 2019-01-31 MED FILL — DEXAMETHASONE 4 MG TABLET: 4 | 24 days supply | Qty: 84 | Fill #0

## 2019-01-31 NOTE — Telephone Encounter (Signed)
Call received from patient stating that she has "red dots" on her legs and would like to have a CBC drawn today and her prescription of Decadron sent in.  Dr. Marin Olp notified and orders received for CBC today and Decadron taper to be sent in.  Patient notified of MD orders and has no other questions or concerns at this time.

## 2019-01-31 NOTE — Telephone Encounter (Signed)
Decadron refill Taper sent to pt Pharmacy per MD.

## 2019-02-01 ENCOUNTER — Telehealth: Payer: Self-pay | Admitting: Hematology & Oncology

## 2019-02-01 NOTE — Telephone Encounter (Signed)
lmom to inform pt of 4/30 appt at 315 pm per 4/13 LOS

## 2019-02-11 ENCOUNTER — Other Ambulatory Visit: Payer: Self-pay | Admitting: *Deleted

## 2019-02-11 DIAGNOSIS — D693 Immune thrombocytopenic purpura: Secondary | ICD-10-CM

## 2019-02-14 ENCOUNTER — Other Ambulatory Visit: Payer: Self-pay

## 2019-02-14 ENCOUNTER — Encounter: Payer: Self-pay | Admitting: Hematology & Oncology

## 2019-02-14 ENCOUNTER — Inpatient Hospital Stay: Payer: 59

## 2019-02-14 ENCOUNTER — Inpatient Hospital Stay (HOSPITAL_BASED_OUTPATIENT_CLINIC_OR_DEPARTMENT_OTHER): Payer: 59 | Admitting: Hematology & Oncology

## 2019-02-14 VITALS — BP 139/72 | HR 78 | Temp 97.6°F | Resp 18 | Wt 197.0 lb

## 2019-02-14 DIAGNOSIS — D693 Immune thrombocytopenic purpura: Secondary | ICD-10-CM

## 2019-02-14 DIAGNOSIS — Z79899 Other long term (current) drug therapy: Secondary | ICD-10-CM | POA: Diagnosis not present

## 2019-02-14 DIAGNOSIS — Z7901 Long term (current) use of anticoagulants: Secondary | ICD-10-CM | POA: Diagnosis not present

## 2019-02-14 DIAGNOSIS — I2699 Other pulmonary embolism without acute cor pulmonale: Secondary | ICD-10-CM

## 2019-02-14 LAB — CMP (CANCER CENTER ONLY)
ALT: 21 U/L (ref 0–44)
AST: 13 U/L — ABNORMAL LOW (ref 15–41)
Albumin: 3.9 g/dL (ref 3.5–5.0)
Alkaline Phosphatase: 84 U/L (ref 38–126)
Anion gap: 11 (ref 5–15)
BUN: 9 mg/dL (ref 6–20)
CO2: 30 mmol/L (ref 22–32)
Calcium: 9.5 mg/dL (ref 8.9–10.3)
Chloride: 99 mmol/L (ref 98–111)
Creatinine: 0.9 mg/dL (ref 0.44–1.00)
GFR, Est AFR Am: >60 mL/min (ref >60)
GFR, Estimated: >60 mL/min (ref >60)
Glucose, Bld: 88 mg/dL (ref 70–99)
Potassium: 3.5 mmol/L (ref 3.5–5.1)
Sodium: 140 mmol/L (ref 135–145)
Total Bilirubin: 0.5 mg/dL (ref 0.3–1.2)
Total Protein: 6.8 g/dL (ref 6.5–8.1)

## 2019-02-14 LAB — PLATELET BY CITRATE

## 2019-02-14 LAB — CBC WITH DIFFERENTIAL (CANCER CENTER ONLY)
Abs Immature Granulocytes: 0.15 10*3/uL — ABNORMAL HIGH (ref 0.00–0.07)
Basophils Absolute: 0.1 10*3/uL (ref 0.0–0.1)
Basophils Relative: 1 %
Eosinophils Absolute: 0.2 10*3/uL (ref 0.0–0.5)
Eosinophils Relative: 1 %
HCT: 36.9 % (ref 36.0–46.0)
Hemoglobin: 12.4 g/dL (ref 12.0–15.0)
Immature Granulocytes: 1 %
Lymphocytes Relative: 33 %
Lymphs Abs: 5.9 10*3/uL — ABNORMAL HIGH (ref 0.7–4.0)
MCH: 31.9 pg (ref 26.0–34.0)
MCHC: 33.6 g/dL (ref 30.0–36.0)
MCV: 94.9 fL (ref 80.0–100.0)
Monocytes Absolute: 1.4 10*3/uL — ABNORMAL HIGH (ref 0.1–1.0)
Monocytes Relative: 8 %
Neutro Abs: 10.2 10*3/uL — ABNORMAL HIGH (ref 1.7–7.7)
Neutrophils Relative %: 56 %
Platelet Count: 482 10*3/uL — ABNORMAL HIGH (ref 150–400)
RBC: 3.89 MIL/uL (ref 3.87–5.11)
RDW: 14.7 % (ref 11.5–15.5)
WBC Count: 18 10*3/uL — ABNORMAL HIGH (ref 4.0–10.5)
nRBC: 0 % (ref 0.0–0.2)

## 2019-02-14 NOTE — Progress Notes (Signed)
Hematology and Oncology Follow Up Visit  Janice Martin [id number] 10/14/1961 58 y.o. 02/14/2019   Principle Diagnosis:   Chronic immune thrombocytopenia-recurrent  Submassive pulmonary embolism, with right lower extremity thrombus  Current Therapy:    Xarelto 10 mg by mouth daily   Decadron-taper per protocol for relapsed thrombocytopenia     Interim History:  [name] is back for followup.   She had another relapse of her ITP.  However, she got back on Decadron and this took care of the problem for her.  She is doing well right now.  She is still working in Veterinary surgeon.  She says that with the coronavirus, the flow patient's has slowed down a little bit.  She and her husband are building a new house.  Because of the coronavirus, the house is going to be completed later in the summer.  She is had no bleeding.  She is on Xarelto and doing well.  There is no cough or chest wall pain.  She has had no shortness of breath.    Overall, her performance status is ECOG 1.    Medications:  Current Outpatient Medications:  .  BYSTOLIC 5 MG tablet, TAKE 1 TABLET BY MOUTH DAILY., Disp: 90 tablet, Rfl: 1 .  dexamethasone (DECADRON) 4 MG tablet, TAKE 5 TABLETS BY MOUTH DAILY FOR 4 DAYS, 3 TABLETS FOR 2 DAYS, 1 TABLET FOR 2 DAYS, Disp: 100 tablet, Rfl: 2 .  Rivaroxaban (XARELTO) 10 MG TABS tablet, Take 1 tablet (10 mg total) by mouth daily with supper., Disp: 30 tablet, Rfl: 12 .  valsartan-hydrochlorothiazide (DIOVAN-HCT) 320-25 MG tablet, Take 1 tablet by mouth daily. (Patient taking differently: Take 1 tablet by mouth at bedtime. ), Disp: 90 tablet, Rfl: 1 .  vitamin B-12 (CYANOCOBALAMIN) 100 MCG tablet, Take 100 mcg by mouth at bedtime. , Disp: , Rfl:   Allergies:  Allergies  Allergen Reactions  . Bee Venom Anaphylaxis, Shortness Of Breath and Swelling  . Influenza Vaccines Other (See Comments)    Per pt can't take mess up with her immune system  . Morphine Other (See  Comments)    Burns really bad  . Shellfish Allergy Anaphylaxis    Past Medical History, Surgical history, Social history, and Family History were reviewed and updated.  Review of Systems: Review of Systems  Constitutional: Negative.   HENT: Negative.   Eyes: Negative.   Respiratory: Negative.   Cardiovascular: Negative.   Gastrointestinal: Negative.   Genitourinary: Negative.   Musculoskeletal: Negative.   Skin: Negative.   Neurological: Negative.   Endo/Heme/Allergies: Negative.   Psychiatric/Behavioral: Negative.     Physical Exam:  weight is 197 lb (89.4 kg). Her oral temperature is 97.6 F (36.4 C). Her blood pressure is 139/72 and her pulse is 78. Her respiration is 18 and oxygen saturation is 100%.   Physical Exam Vitals signs reviewed.  HENT:     Head: Normocephalic and atraumatic.  Eyes:     Pupils: Pupils are equal, round, and reactive to light.  Neck:     Musculoskeletal: Normal range of motion.  Cardiovascular:     Rate and Rhythm: Normal rate and regular rhythm.     Heart sounds: Normal heart sounds.  Pulmonary:     Effort: Pulmonary effort is normal.     Breath sounds: Normal breath sounds.  Abdominal:     General: Bowel sounds are normal.     Palpations: Abdomen is soft.     Comments: She has well-healed splenectomy  scar.  No fluid wave.  No palpable liver.  No guarding or rebound tenderness.  Musculoskeletal: Normal range of motion.        General: No tenderness or deformity.  Lymphadenopathy:     Cervical: No cervical adenopathy.  Skin:    General: Skin is warm and dry.     Findings: No erythema or rash.  Neurological:     Mental Status: She is alert and oriented to person, place, and time.  Psychiatric:        Behavior: Behavior normal.        Thought Content: Thought content normal.        Judgment: Judgment normal.     Lab Results  Component Value Date   WBC 18.0 (H) 02/14/2019   HGB 12.4 02/14/2019   HCT 36.9 02/14/2019   MCV 94.9  02/14/2019   PLT 482 (H) 02/14/2019     Chemistry      Component Value Date/Time   NA 140 02/14/2019 1505   NA 140 09/17/2017 1507   K 3.5 02/14/2019 1505   K 3.9 09/17/2017 1507   CL 99 02/14/2019 1505   CL 99 04/17/2017 1411   CO2 30 02/14/2019 1505   CO2 30 (H) 09/17/2017 1507   BUN 9 02/14/2019 1505   BUN 8.9 09/17/2017 1507   CREATININE 0.90 02/14/2019 1505   CREATININE 1.2 (H) 09/17/2017 1507      Component Value Date/Time   CALCIUM 9.5 02/14/2019 1505   CALCIUM 9.3 09/17/2017 1507   ALKPHOS 84 02/14/2019 1505   ALKPHOS 105 09/17/2017 1507   AST 13 (L) 02/14/2019 1505   AST 15 09/17/2017 1507   ALT 21 02/14/2019 1505   ALT 18 09/17/2017 1507   BILITOT 0.5 02/14/2019 1505   BILITOT 0.38 09/17/2017 1507       Impression and Plan: Ms. Drawdy is a 58 year old white female. She has a history of relapsed chronic immune thrombocytopenia.   I saw her blood under the microscope.  She had some Howell-Jolly bodies with some of the red cells.  I do not see any evidence that she has developed a new spleen.  Her platelets were quite prominent.  She had large platelets.  As always, she knows when she has a relapse.  She gets petechia on her legs.  We will plan to get her back in another 2-3 months.   [name], MD 4/27/20204:03 PM

## 2019-02-15 ENCOUNTER — Telehealth: Payer: Self-pay | Admitting: Hematology & Oncology

## 2019-02-15 LAB — LACTATE DEHYDROGENASE: LDH: 246 U/L — ABNORMAL HIGH (ref 98–192)

## 2019-02-15 NOTE — Telephone Encounter (Signed)
lmom to inform pt of 7/10 appt at 315 pm per 4/27 LOS

## 2019-02-17 ENCOUNTER — Other Ambulatory Visit: Payer: Self-pay

## 2019-02-17 ENCOUNTER — Ambulatory Visit: Payer: Self-pay | Admitting: Hematology & Oncology

## 2019-02-25 ENCOUNTER — Encounter: Payer: 59 | Admitting: Internal Medicine

## 2019-03-07 ENCOUNTER — Other Ambulatory Visit: Payer: Self-pay | Admitting: *Deleted

## 2019-03-07 ENCOUNTER — Telehealth: Payer: Self-pay | Admitting: *Deleted

## 2019-03-07 ENCOUNTER — Inpatient Hospital Stay: Payer: 59 | Attending: Hematology & Oncology

## 2019-03-07 ENCOUNTER — Other Ambulatory Visit: Payer: Self-pay

## 2019-03-07 DIAGNOSIS — I2699 Other pulmonary embolism without acute cor pulmonale: Secondary | ICD-10-CM | POA: Insufficient documentation

## 2019-03-07 DIAGNOSIS — D693 Immune thrombocytopenic purpura: Secondary | ICD-10-CM | POA: Diagnosis not present

## 2019-03-07 DIAGNOSIS — I82401 Acute embolism and thrombosis of unspecified deep veins of right lower extremity: Secondary | ICD-10-CM | POA: Diagnosis not present

## 2019-03-07 DIAGNOSIS — Z79899 Other long term (current) drug therapy: Secondary | ICD-10-CM | POA: Diagnosis not present

## 2019-03-07 LAB — CMP (CANCER CENTER ONLY)
ALT: 30 U/L (ref 0–44)
AST: 15 U/L (ref 15–41)
Albumin: 4 g/dL (ref 3.5–5.0)
Alkaline Phosphatase: 98 U/L (ref 38–126)
Anion gap: 11 (ref 5–15)
BUN: 13 mg/dL (ref 6–20)
CO2: 28 mmol/L (ref 22–32)
Calcium: 9.9 mg/dL (ref 8.9–10.3)
Chloride: 101 mmol/L (ref 98–111)
Creatinine: 1.04 mg/dL — ABNORMAL HIGH (ref 0.44–1.00)
GFR, Est AFR Am: >60 mL/min (ref >60)
GFR, Estimated: >60 mL/min (ref >60)
Glucose, Bld: 193 mg/dL — ABNORMAL HIGH (ref 70–99)
Potassium: 3.8 mmol/L (ref 3.5–5.1)
Sodium: 140 mmol/L (ref 135–145)
Total Bilirubin: 0.3 mg/dL (ref 0.3–1.2)
Total Protein: 7.8 g/dL (ref 6.5–8.1)

## 2019-03-07 LAB — CBC WITH DIFFERENTIAL (CANCER CENTER ONLY)
Abs Immature Granulocytes: 0.24 10*3/uL — ABNORMAL HIGH (ref 0.00–0.07)
Basophils Absolute: 0.1 10*3/uL (ref 0.0–0.1)
Basophils Relative: 0 %
Eosinophils Absolute: 0 10*3/uL (ref 0.0–0.5)
Eosinophils Relative: 0 %
HCT: 40 % (ref 36.0–46.0)
Hemoglobin: 13.7 g/dL (ref 12.0–15.0)
Immature Granulocytes: 1 %
Lymphocytes Relative: 9 %
Lymphs Abs: 1.7 10*3/uL (ref 0.7–4.0)
MCH: 32.1 pg (ref 26.0–34.0)
MCHC: 34.3 g/dL (ref 30.0–36.0)
MCV: 93.7 fL (ref 80.0–100.0)
Monocytes Absolute: 0.2 10*3/uL (ref 0.1–1.0)
Monocytes Relative: 1 %
Neutro Abs: 16.1 10*3/uL — ABNORMAL HIGH (ref 1.7–7.7)
Neutrophils Relative %: 89 %
Platelet Count: 69 10*3/uL — ABNORMAL LOW (ref 150–400)
RBC: 4.27 MIL/uL (ref 3.87–5.11)
RDW: 14.1 % (ref 11.5–15.5)
WBC Count: 18.4 10*3/uL — ABNORMAL HIGH (ref 4.0–10.5)
nRBC: 0.3 % — ABNORMAL HIGH (ref 0.0–0.2)

## 2019-03-07 NOTE — Telephone Encounter (Signed)
Call received from patient stating that she believes her platelets are low d/t "red dots are everywhere".  Pt states that she started the Decadron taper last night and has enough at this time.  Pt states that she would like to go for labs today at [facility] after 2:00PM.  Message sent to scheduling.

## 2019-03-08 ENCOUNTER — Telehealth: Payer: Self-pay | Admitting: *Deleted

## 2019-03-08 NOTE — Telephone Encounter (Signed)
Pt notified that platelet count is 69 per order of Dr. Marin Olp and to start Decadron protocol.  Pt started Decadron protocol yesterday and has no questions or concerns at this time.

## 2019-03-08 NOTE — Telephone Encounter (Signed)
-----   Message from [name], MD sent at 03/08/2019  2:02 PM EDT ----- Call - the platelets are going back down again!!!  Need to get back on decadron -- she already had the decadron and knows how to take it!!  Janice Martin

## 2019-03-11 MED FILL — XARELTO 10 MG TABLET: 10 | 90 days supply | Qty: 90 | Fill #2

## 2019-03-13 NOTE — Progress Notes (Signed)
Error-no-show  Virtual Visit via Video Note  I connected with Janice Martin on 03/15/19 at  2:30 PM EDT by a video enabled telemedicine application and verified that I am speaking with the correct person using two identifiers.   I discussed the limitations of evaluation and management by telemedicine and the availability of in person appointments. The patient expressed understanding and agreed to proceed.  The patient is currently at home and I am in the office.    No referring provider.    History of Present Illness: She is here for follow up of her chronic medical conditions.   Increased stress, anxiety:    Hypertension: She is taking her medication daily. She is compliant with a low sodium diet.  She denies chest pain, palpitations, edema, shortness of breath and regular headaches. She does not monitor her blood pressure at home.    H/o PE, DVT due to ITP, thrombocytopenia:  She follows with oncology.  She    Social History   Socioeconomic History  . Marital status: Married    Spouse name: Not on file  . Number of children: Not on file  . Years of education: Not on file  . Highest education level: Not on file  Occupational History  . Not on file  Social Needs  . Financial resource strain: Not on file  . Food insecurity:    Worry: Not on file    Inability: Not on file  . Transportation needs:    Medical: Not on file    Non-medical: Not on file  Tobacco Use  . Smoking status: Never Smoker  . Smokeless tobacco: Never Used  . Tobacco comment: never used product  Substance and Sexual Activity  . Alcohol use: No    Alcohol/week: 0.0 standard drinks  . Drug use: No  . Sexual activity: Not on file  Lifestyle  . Physical activity:    Days per week: Not on file    Minutes per session: Not on file  . Stress: Not on file  Relationships  . Social connections:    Talks on phone: Not on file    Gets together: Not on file    Attends religious service: Not on file   Active member of club or organization: Not on file    Attends meetings of clubs or organizations: Not on file    Relationship status: Not on file  Other Topics Concern  . Not on file  Social History Narrative  . Not on file     Observations/Objective: Appears well in NAD   Assessment and Plan:  See Problem List for Assessment and Plan of chronic medical problems.   Follow Up Instructions:    I discussed the assessment and treatment plan with the patient. The patient was provided an opportunity to ask questions and all were answered. The patient agreed with the plan and demonstrated an understanding of the instructions.   The patient was advised to call back or seek an in-person evaluation if the symptoms worsen or if the condition fails to improve as anticipated.    Binnie Rail, MD  This encounter was created in error - please disregard.

## 2019-03-15 ENCOUNTER — Encounter: Payer: 59 | Admitting: Internal Medicine

## 2019-04-18 ENCOUNTER — Telehealth: Payer: Self-pay | Admitting: Hematology & Oncology

## 2019-04-18 ENCOUNTER — Inpatient Hospital Stay: Payer: 59 | Attending: Hematology & Oncology

## 2019-04-18 ENCOUNTER — Encounter: Payer: Self-pay | Admitting: Hematology & Oncology

## 2019-04-18 ENCOUNTER — Other Ambulatory Visit: Payer: Self-pay

## 2019-04-18 DIAGNOSIS — I2699 Other pulmonary embolism without acute cor pulmonale: Secondary | ICD-10-CM | POA: Insufficient documentation

## 2019-04-18 DIAGNOSIS — D693 Immune thrombocytopenic purpura: Secondary | ICD-10-CM | POA: Diagnosis not present

## 2019-04-18 DIAGNOSIS — Z79899 Other long term (current) drug therapy: Secondary | ICD-10-CM | POA: Diagnosis not present

## 2019-04-18 LAB — CBC WITH DIFFERENTIAL (CANCER CENTER ONLY)
Abs Immature Granulocytes: 0.34 10*3/uL — ABNORMAL HIGH (ref 0.00–0.07)
Basophils Absolute: 0.1 10*3/uL (ref 0.0–0.1)
Basophils Relative: 0 %
Eosinophils Absolute: 0 10*3/uL (ref 0.0–0.5)
Eosinophils Relative: 0 %
HCT: 42.1 % (ref 36.0–46.0)
Hemoglobin: 14.1 g/dL (ref 12.0–15.0)
Immature Granulocytes: 1 %
Lymphocytes Relative: 9 %
Lymphs Abs: 2.1 10*3/uL (ref 0.7–4.0)
MCH: 31.7 pg (ref 26.0–34.0)
MCHC: 33.5 g/dL (ref 30.0–36.0)
MCV: 94.6 fL (ref 80.0–100.0)
Monocytes Absolute: 0.7 10*3/uL (ref 0.1–1.0)
Monocytes Relative: 3 %
Neutro Abs: 21.8 10*3/uL — ABNORMAL HIGH (ref 1.7–7.7)
Neutrophils Relative %: 87 %
Platelet Count: 79 10*3/uL — ABNORMAL LOW (ref 150–400)
RBC: 4.45 MIL/uL (ref 3.87–5.11)
RDW: 13.8 % (ref 11.5–15.5)
WBC Count: 25 10*3/uL — ABNORMAL HIGH (ref 4.0–10.5)
nRBC: 0.2 % (ref 0.0–0.2)

## 2019-04-18 NOTE — Telephone Encounter (Signed)
Patient called to r/s 7/10 appointments.  Appointments were r/s as she requested

## 2019-04-18 NOTE — Telephone Encounter (Signed)
Called and lmvm for patient regarding appointment added per 6/29 secure chat from Charlsie Merles, RN

## 2019-04-29 ENCOUNTER — Ambulatory Visit: Payer: 59 | Admitting: Hematology & Oncology

## 2019-04-29 ENCOUNTER — Other Ambulatory Visit: Payer: 59

## 2019-05-02 ENCOUNTER — Encounter: Payer: Self-pay | Admitting: Internal Medicine

## 2019-05-03 ENCOUNTER — Encounter: Payer: Self-pay | Admitting: Internal Medicine

## 2019-05-09 NOTE — Progress Notes (Signed)
Virtual Visit via Video Note  I connected with Janice Martin on 05/10/19 at  2:15 PM EDT by a video enabled telemedicine application and verified that I am speaking with the correct person using two identifiers.   I discussed the limitations of evaluation and management by telemedicine and the availability of in person appointments. The patient expressed understanding and agreed to proceed.  [place] the beach and I am in the office.    No referring provider.    History of Present Illness: She is here for follow up of her chronic medical conditions.   She is exercising regularly - walking.    Hypertension: She is taking her medication daily. She is compliant with a low sodium diet.  She denies chest pain, palpitations, edema, shortness of breath and regular headaches.   Severe thrombocytopenia, ITP:  She sees Dr Marin Olp.  She has been on steroids for a long time and will be started on a new medication next month.    Side effects from the steroids:  She aches all over.  She feels it is from the steroids.  She has gained weight and is puffy.  She is eager to get off the steroids.    Hyperglycemia:  She is walking on the treadmill and outside.  She is not always compliant with a low sugar/carb diet.    Her mom has end stage dementia and has CHF.  She lives in an asst living facility and has not been able to see her.     She is building a house in [place].      Review of Systems  Constitutional: Negative for chills and fever.  Respiratory: Negative for cough, shortness of breath and wheezing.   Cardiovascular: Negative for chest pain, palpitations and leg swelling.  Neurological: Negative for dizziness and headaches.     Social History   Socioeconomic History  . Marital status: Married    Spouse name: Not on file  . Number of children: Not on file  . Years of education: Not on file  . Highest education level: Not on file  Occupational History  .  Not on file  Social Needs  . Financial resource strain: Not on file  . Food insecurity    Worry: Not on file    Inability: Not on file  . Transportation needs    Medical: Not on file    Non-medical: Not on file  Tobacco Use  . Smoking status: Never Smoker  . Smokeless tobacco: Never Used  . Tobacco comment: never used product  Substance and Sexual Activity  . Alcohol use: No    Alcohol/week: 0.0 standard drinks  . Drug use: No  . Sexual activity: Not on file  Lifestyle  . Physical activity    Days per week: Not on file    Minutes per session: Not on file  . Stress: Not on file  Relationships  . Social Herbalist on phone: Not on file    Gets together: Not on file    Attends religious service: Not on file    Active member of club or organization: Not on file    Attends meetings of clubs or organizations: Not on file    Relationship status: Not on file  Other Topics Concern  . Not on file  Social History Narrative  . Not on file     Observations/Objective: Appears well in NAD   Assessment and Plan:  See Problem List  for Assessment and Plan of chronic medical problems.   Follow Up Instructions:    I discussed the assessment and treatment plan with the patient. The patient was provided an opportunity to ask questions and all were answered. The patient agreed with the plan and demonstrated an understanding of the instructions.   The patient was advised to call back or seek an in-person evaluation if the symptoms worsen or if the condition fails to improve as anticipated.    Binnie Rail, MD

## 2019-05-10 ENCOUNTER — Encounter: Payer: Self-pay | Admitting: Internal Medicine

## 2019-05-10 ENCOUNTER — Ambulatory Visit (INDEPENDENT_AMBULATORY_CARE_PROVIDER_SITE_OTHER): Payer: 59 | Admitting: Internal Medicine

## 2019-05-10 DIAGNOSIS — D693 Immune thrombocytopenic purpura: Secondary | ICD-10-CM | POA: Diagnosis not present

## 2019-05-10 DIAGNOSIS — I10 Essential (primary) hypertension: Secondary | ICD-10-CM | POA: Diagnosis not present

## 2019-05-10 DIAGNOSIS — R739 Hyperglycemia, unspecified: Secondary | ICD-10-CM | POA: Diagnosis not present

## 2019-05-10 MED ORDER — VALSARTAN-HYDROCHLOROTHIAZIDE 320-25 MG PO TABS: 1.0000 | ORAL_TABLET | Freq: Every day | ORAL | 1 refills | Status: DC

## 2019-05-10 MED ORDER — NEBIVOLOL HCL 5 MG PO TABS: 5.0000 mg | ORAL_TABLET | Freq: Every day | ORAL | 1 refills | Status: DC

## 2019-05-10 MED FILL — VALSARTAN-HCTZ 320-25 MG TA: 320-25 | 30 days supply | Qty: 30 | Fill #0

## 2019-05-10 MED FILL — BYSTOLIC 5 MG TABLET: 5 | 90 days supply | Qty: 90 | Fill #0

## 2019-05-10 NOTE — Assessment & Plan Note (Signed)
Management per Dr Marin Olp On steroids - will be starting a new medication to get off the steroids

## 2019-05-10 NOTE — Assessment & Plan Note (Signed)
BP Readings from Last 3 Encounters:  02/14/19 139/72  11/18/18 140/79  09/24/18 136/78   BP controlled Current regimen effective and well tolerated Continue current medications at current doses

## 2019-05-10 NOTE — Assessment & Plan Note (Signed)
Exercising  Not always complaint with low sugar/carb diet Has been on steroids  should check a1c when able

## 2019-05-14 ENCOUNTER — Encounter: Payer: Self-pay | Admitting: Hematology & Oncology

## 2019-05-16 ENCOUNTER — Inpatient Hospital Stay: Payer: 59 | Attending: Hematology & Oncology

## 2019-05-16 ENCOUNTER — Telehealth: Payer: Self-pay | Admitting: *Deleted

## 2019-05-16 ENCOUNTER — Other Ambulatory Visit: Payer: Self-pay

## 2019-05-16 DIAGNOSIS — D693 Immune thrombocytopenic purpura: Secondary | ICD-10-CM | POA: Insufficient documentation

## 2019-05-16 DIAGNOSIS — I2699 Other pulmonary embolism without acute cor pulmonale: Secondary | ICD-10-CM | POA: Diagnosis not present

## 2019-05-16 DIAGNOSIS — Z79899 Other long term (current) drug therapy: Secondary | ICD-10-CM | POA: Diagnosis not present

## 2019-05-16 LAB — CBC WITH DIFFERENTIAL (CANCER CENTER ONLY)
Abs Immature Granulocytes: 0.26 10*3/uL — ABNORMAL HIGH (ref 0.00–0.07)
Basophils Absolute: 0 10*3/uL (ref 0.0–0.1)
Basophils Relative: 0 %
Eosinophils Absolute: 0 10*3/uL (ref 0.0–0.5)
Eosinophils Relative: 0 %
HCT: 39.4 % (ref 36.0–46.0)
Hemoglobin: 13.4 g/dL (ref 12.0–15.0)
Immature Granulocytes: 1 %
Lymphocytes Relative: 7 %
Lymphs Abs: 1.5 10*3/uL (ref 0.7–4.0)
MCH: 31.5 pg (ref 26.0–34.0)
MCHC: 34 g/dL (ref 30.0–36.0)
MCV: 92.5 fL (ref 80.0–100.0)
Monocytes Absolute: 0.4 10*3/uL (ref 0.1–1.0)
Monocytes Relative: 2 %
Neutro Abs: 20 10*3/uL — ABNORMAL HIGH (ref 1.7–7.7)
Neutrophils Relative %: 90 %
Platelet Count: 75 10*3/uL — ABNORMAL LOW (ref 150–400)
RBC: 4.26 MIL/uL (ref 3.87–5.11)
RDW: 13.5 % (ref 11.5–15.5)
WBC Count: 22.2 10*3/uL — ABNORMAL HIGH (ref 4.0–10.5)
nRBC: 0.1 % (ref 0.0–0.2)

## 2019-05-16 NOTE — Telephone Encounter (Signed)
Pt notified that platelets are 75 from today's CBC.  Pt states that she started Decadron on Saturday morning.  Dr. Marin Olp notified.

## 2019-05-16 NOTE — Telephone Encounter (Signed)
Call placed to patient to notify her that message has been sent to scheduling about her lab appt today and to see if she needs a refill on Decadron.  Pt states that lab appt has already been made and that she does not need refill on Decadron.  Informed pt that I would call her with CBC results this afternoon.  Dr. Marin Olp notified of pt.'s condition and would like to see patient today.  Message sent to scheduling.

## 2019-05-17 MED FILL — DEXAMETHASONE 4 MG TABLET: 4 | 24 days supply | Qty: 84 | Fill #1

## 2019-05-25 ENCOUNTER — Encounter: Payer: Self-pay | Admitting: Hematology & Oncology

## 2019-05-25 ENCOUNTER — Other Ambulatory Visit: Payer: Self-pay

## 2019-05-25 ENCOUNTER — Inpatient Hospital Stay: Payer: 59 | Attending: Hematology & Oncology

## 2019-05-25 ENCOUNTER — Inpatient Hospital Stay (HOSPITAL_BASED_OUTPATIENT_CLINIC_OR_DEPARTMENT_OTHER): Payer: 59 | Admitting: Hematology & Oncology

## 2019-05-25 VITALS — BP 158/79 | HR 73 | Temp 97.8°F | Resp 20 | Wt 199.1 lb

## 2019-05-25 DIAGNOSIS — Z7901 Long term (current) use of anticoagulants: Secondary | ICD-10-CM | POA: Insufficient documentation

## 2019-05-25 DIAGNOSIS — Z79899 Other long term (current) drug therapy: Secondary | ICD-10-CM | POA: Insufficient documentation

## 2019-05-25 DIAGNOSIS — D693 Immune thrombocytopenic purpura: Secondary | ICD-10-CM | POA: Diagnosis not present

## 2019-05-25 DIAGNOSIS — I2699 Other pulmonary embolism without acute cor pulmonale: Secondary | ICD-10-CM | POA: Insufficient documentation

## 2019-05-25 LAB — CBC WITH DIFFERENTIAL (CANCER CENTER ONLY)
Abs Immature Granulocytes: 0.28 10*3/uL — ABNORMAL HIGH (ref 0.00–0.07)
Basophils Absolute: 0.1 10*3/uL (ref 0.0–0.1)
Basophils Relative: 1 %
Eosinophils Absolute: 0.1 10*3/uL (ref 0.0–0.5)
Eosinophils Relative: 1 %
HCT: 40.1 % (ref 36.0–46.0)
Hemoglobin: 13.2 g/dL (ref 12.0–15.0)
Immature Granulocytes: 2 %
Lymphocytes Relative: 33 %
Lymphs Abs: 4.8 10*3/uL — ABNORMAL HIGH (ref 0.7–4.0)
MCH: 31.7 pg (ref 26.0–34.0)
MCHC: 32.9 g/dL (ref 30.0–36.0)
MCV: 96.4 fL (ref 80.0–100.0)
Monocytes Absolute: 1.4 10*3/uL — ABNORMAL HIGH (ref 0.1–1.0)
Monocytes Relative: 9 %
Neutro Abs: 8 10*3/uL — ABNORMAL HIGH (ref 1.7–7.7)
Neutrophils Relative %: 54 %
Platelet Count: 337 10*3/uL (ref 150–400)
RBC: 4.16 MIL/uL (ref 3.87–5.11)
RDW: 14.2 % (ref 11.5–15.5)
WBC Count: 14.7 10*3/uL — ABNORMAL HIGH (ref 4.0–10.5)
nRBC: 0 % (ref 0.0–0.2)

## 2019-05-25 MED FILL — BYSTOLIC 5 MG TABLET: 5 | 90 days supply | Qty: 90 | Fill #0

## 2019-05-25 MED FILL — VALSARTAN-HCTZ 320-25 MG TA: 320-25 | 30 days supply | Qty: 30 | Fill #0

## 2019-05-25 NOTE — Progress Notes (Signed)
Hematology and Oncology Follow Up Visit  GRETHEL ZENK [id number] [birth year] 58 y.o. 05/25/2019   Principle Diagnosis:   Chronic immune thrombocytopenia-recurrent  Submassive pulmonary embolism, with right lower extremity thrombus  Current Therapy:    Xarelto 10 mg by mouth daily   Decadron-taper per protocol for relapsed thrombocytopenia  Nplate q weekly for platelet count > 100K     Interim History:  Ms.  Borunda is back for followup.   Unfortunately, it looks like her ITP is relapsing more often.  There is a problem for [place].  She cannot be taking Decadron all the time.  This would really affect her. He has affected her and certain ways.  I think we have to try to get her on Nplate.  I think this would be a good choice for her.  I would think that this will work.  She is ready to start Nplate.  She knows that is a weekly injection.  I would probably try to keep her platelet count above 100,000.  We can probably hold the Nplate if her platelet count gets above 250,000.  She has had no bleeding.  She does get the petechia when her platelet count gets down below 80,000.  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  Overall, her performance status is ECOG 1.    Medications:  Current Outpatient Medications:  .  nebivolol (BYSTOLIC) 5 MG tablet, Take 1 tablet (5 mg total) by mouth daily., Disp: 90 tablet, Rfl: 1 .  Rivaroxaban (XARELTO) 10 MG TABS tablet, Take 1 tablet (10 mg total) by mouth daily with supper., Disp: 30 tablet, Rfl: 12 .  valsartan-hydrochlorothiazide (DIOVAN-HCT) 320-25 MG tablet, Take 1 tablet by mouth daily., Disp: 90 tablet, Rfl: 1 .  vitamin B-12 (CYANOCOBALAMIN) 100 MCG tablet, Take 100 mcg by mouth at bedtime. , Disp: , Rfl:  .  dexamethasone (DECADRON) 4 MG tablet, TAKE 5 TABLETS BY MOUTH DAILY FOR 4 DAYS, 3 TABLETS FOR 2 DAYS, 1 TABLET FOR 2 DAYS, Disp: 100 tablet, Rfl: 2  Allergies:  Allergies  Allergen Reactions  . Bee  Venom Anaphylaxis, Shortness Of Breath and Swelling  . Influenza Vaccines Other (See Comments)    Per pt can't take mess up with her immune system  . Morphine Other (See Comments)    Burns really bad  . Shellfish Allergy Anaphylaxis    Past Medical History, Surgical history, Social history, and Family History were reviewed and updated.  Review of Systems: Review of Systems  Constitutional: Negative.   HENT: Negative.   Eyes: Negative.   Respiratory: Negative.   Cardiovascular: Negative.   Gastrointestinal: Negative.   Genitourinary: Negative.   Musculoskeletal: Negative.   Skin: Negative.   Neurological: Negative.   Endo/Heme/Allergies: Negative.   Psychiatric/Behavioral: Negative.     Physical Exam:  weight is 199 lb 1.9 oz (90.3 kg). Her oral temperature is 97.8 F (36.6 C). Her blood pressure is 158/79 (abnormal) and her pulse is 73. Her respiration is 20 and oxygen saturation is 100%.   Physical Exam Vitals signs reviewed.  HENT:     Head: Normocephalic and atraumatic.  Eyes:     Pupils: Pupils are equal, round, and reactive to light.  Neck:     Musculoskeletal: Normal range of motion.  Cardiovascular:     Rate and Rhythm: Normal rate and regular rhythm.     Heart sounds: Normal heart sounds.  Pulmonary:     Effort: Pulmonary effort is  normal.     Breath sounds: Normal breath sounds.  Abdominal:     General: Bowel sounds are normal.     Palpations: Abdomen is soft.     Comments: She has well-healed splenectomy scar.  No fluid wave.  No palpable liver.  No guarding or rebound tenderness.  Musculoskeletal: Normal range of motion.        General: No tenderness or deformity.  Lymphadenopathy:     Cervical: No cervical adenopathy.  Skin:    General: Skin is warm and dry.     Findings: No erythema or rash.  Neurological:     Mental Status: She is alert and oriented to person, place, and time.  Psychiatric:        Behavior: Behavior normal.        Thought  Content: Thought content normal.        Judgment: Judgment normal.     Lab Results  Component Value Date   WBC 14.7 (H) 05/25/2019   HGB 13.2 05/25/2019   HCT 40.1 05/25/2019   MCV 96.4 05/25/2019   PLT 337 05/25/2019     Chemistry      Component Value Date/Time   NA 140 03/07/2019 1505   NA 140 09/17/2017 1507   K 3.8 03/07/2019 1505   K 3.9 09/17/2017 1507   CL 101 03/07/2019 1505   CL 99 04/17/2017 1411   CO2 28 03/07/2019 1505   CO2 30 (H) 09/17/2017 1507   BUN 13 03/07/2019 1505   BUN 8.9 09/17/2017 1507   CREATININE 1.04 (H) 03/07/2019 1505   CREATININE 1.2 (H) 09/17/2017 1507      Component Value Date/Time   CALCIUM 9.9 03/07/2019 1505   CALCIUM 9.3 09/17/2017 1507   ALKPHOS 98 03/07/2019 1505   ALKPHOS 105 09/17/2017 1507   AST 15 03/07/2019 1505   AST 15 09/17/2017 1507   ALT 30 03/07/2019 1505   ALT 18 09/17/2017 1507   BILITOT 0.3 03/07/2019 1505   BILITOT 0.38 09/17/2017 1507       Impression and Plan: Ms. Shafer is a 58 year old white female. She has a history of relapsed chronic immune thrombocytopenia.   I saw her blood under the microscope.  She had some Howell-Jolly bodies with some of the red cells.  I do not see any evidence that she has developed a new spleen.  Her platelets were quite prominent.  She had large platelets.  I will set the Nplate up for her to start in 2 weeks.  I think this would be a good time to start.  Her platelet count is great right now.  Again she is on the steroids.  She really wants to stop taking them if possible.  I spent about 40 minutes with her today.  Again, this is something that is new that we have to start.  I did talk to her about all this.  Absence of the protocol.  I will make sure that she has her Nplate and labs weekly at [facility].  I would like to see her back myself in about 6 weeks.   [name], MD 8/5/20209:10 AM

## 2019-06-06 ENCOUNTER — Other Ambulatory Visit: Payer: Self-pay

## 2019-06-06 ENCOUNTER — Inpatient Hospital Stay: Payer: 59

## 2019-06-06 ENCOUNTER — Telehealth: Payer: Self-pay | Admitting: *Deleted

## 2019-06-06 VITALS — BP 148/78 | HR 72 | Temp 98.2°F | Resp 18

## 2019-06-06 DIAGNOSIS — D693 Immune thrombocytopenic purpura: Secondary | ICD-10-CM

## 2019-06-06 DIAGNOSIS — I2699 Other pulmonary embolism without acute cor pulmonale: Secondary | ICD-10-CM | POA: Diagnosis not present

## 2019-06-06 DIAGNOSIS — Z7901 Long term (current) use of anticoagulants: Secondary | ICD-10-CM | POA: Diagnosis not present

## 2019-06-06 DIAGNOSIS — Z79899 Other long term (current) drug therapy: Secondary | ICD-10-CM | POA: Diagnosis not present

## 2019-06-06 LAB — CBC WITH DIFFERENTIAL (CANCER CENTER ONLY)
Abs Immature Granulocytes: 0.02 10*3/uL (ref 0.00–0.07)
Basophils Absolute: 0.1 10*3/uL (ref 0.0–0.1)
Basophils Relative: 1 %
Eosinophils Absolute: 0.1 10*3/uL (ref 0.0–0.5)
Eosinophils Relative: 1 %
HCT: 41.9 % (ref 36.0–46.0)
Hemoglobin: 14.3 g/dL (ref 12.0–15.0)
Immature Granulocytes: 0 %
Lymphocytes Relative: 36 %
Lymphs Abs: 3.9 10*3/uL (ref 0.7–4.0)
MCH: 31.4 pg (ref 26.0–34.0)
MCHC: 34.1 g/dL (ref 30.0–36.0)
MCV: 91.9 fL (ref 80.0–100.0)
Monocytes Absolute: 1 10*3/uL (ref 0.1–1.0)
Monocytes Relative: 9 %
Neutro Abs: 5.8 10*3/uL (ref 1.7–7.7)
Neutrophils Relative %: 53 %
Platelet Count: 21 10*3/uL — ABNORMAL LOW (ref 150–400)
RBC: 4.56 MIL/uL (ref 3.87–5.11)
RDW: 13.3 % (ref 11.5–15.5)
WBC Count: 10.9 10*3/uL — ABNORMAL HIGH (ref 4.0–10.5)
nRBC: 0 % (ref 0.0–0.2)

## 2019-06-06 MED ORDER — ROMIPLOSTIM 250 MCG ~~LOC~~ SOLR
1.0000 ug/kg | Freq: Once | SUBCUTANEOUS | Status: AC
  Administered 2019-06-06: 90 ug via SUBCUTANEOUS
  Filled 2019-06-06: qty 0.18

## 2019-06-06 NOTE — Telephone Encounter (Signed)
Received call from patient stating that she developed red dots over the weekend. She has an appt on Wed for labs and injection.  Dr. Marin Olp states to move these appts up to today.  Scheduler notified.

## 2019-06-06 NOTE — Patient Instructions (Signed)
Romiplostim injection What is this medicine? ROMIPLOSTIM (roe mi PLOE stim) helps your body make more platelets. This medicine is used to treat low platelets caused by chronic idiopathic thrombocytopenic purpura (ITP). This medicine may be used for other purposes; ask your health care provider or pharmacist if you have questions. COMMON BRAND NAME(S): Nplate What should I tell my health care provider before I take this medicine? They need to know if you have any of these conditions:  bleeding disorders  bone marrow problem, like blood cancer or myelodysplastic syndrome  history of blood clots  liver disease  surgery to remove your spleen  an unusual or allergic reaction to romiplostim, mannitol, other medicines, foods, dyes, or preservatives  pregnant or trying to get pregnant  breast-feeding How should I use this medicine? This medicine is for injection under the skin. It is given by a health care professional in a hospital or clinic setting. A special MedGuide will be given to you before your injection. Read this information carefully each time. Talk to your pediatrician regarding the use of this medicine in children. While this drug may be prescribed for children as young as 1 year for selected conditions, precautions do apply. Overdosage: If you think you have taken too much of this medicine contact a poison control center or emergency room at once. NOTE: This medicine is only for you. Do not share this medicine with others. What if I miss a dose? It is important not to miss your dose. Call your doctor or health care professional if you are unable to keep an appointment. What may interact with this medicine? Interactions are not expected. This list may not describe all possible interactions. Give your health care provider a list of all the medicines, herbs, non-prescription drugs, or dietary supplements you use. Also tell them if you smoke, drink alcohol, or use illegal drugs.  Some items may interact with your medicine. What should I watch for while using this medicine? Your condition will be monitored carefully while you are receiving this medicine. Visit your prescriber or health care professional for regular checks on your progress and for the needed blood tests. It is important to keep all appointments. What side effects may I notice from receiving this medicine? Side effects that you should report to your doctor or health care professional as soon as possible:  allergic reactions like skin rash, itching or hives, swelling of the face, lips, or tongue  signs and symptoms of bleeding such as bloody or black, tarry stools; red or dark brown urine; spitting up blood or brown material that looks like coffee grounds; red spots on the skin; unusual bruising or bleeding from the eyes, gums, or nose  signs and symptoms of a blood clot such as chest pain; shortness of breath; pain, swelling, or warmth in the leg  signs and symptoms of a stroke like changes in vision; confusion; trouble speaking or understanding; severe headaches; sudden numbness or weakness of the face, arm or leg; trouble walking; dizziness; loss of balance or coordination Side effects that usually do not require medical attention (report to your doctor or health care professional if they continue or are bothersome):  headache  pain in arms and legs  pain in mouth  stomach pain This list may not describe all possible side effects. Call your doctor for medical advice about side effects. You may report side effects to FDA at 1-800-FDA-1088. Where should I keep my medicine? This drug is given in a hospital or clinic   and will not be stored at home. NOTE: This sheet is a summary. It may not cover all possible information. If you have questions about this medicine, talk to your doctor, pharmacist, or health care provider.  2020 Elsevier/Gold Standard (2017-10-05 11:10:55)  

## 2019-06-08 ENCOUNTER — Ambulatory Visit: Payer: 59

## 2019-06-08 ENCOUNTER — Other Ambulatory Visit: Payer: 59

## 2019-06-09 ENCOUNTER — Inpatient Hospital Stay: Payer: 59

## 2019-06-09 ENCOUNTER — Other Ambulatory Visit: Payer: Self-pay

## 2019-06-09 ENCOUNTER — Inpatient Hospital Stay (HOSPITAL_BASED_OUTPATIENT_CLINIC_OR_DEPARTMENT_OTHER): Payer: 59 | Admitting: Family

## 2019-06-09 ENCOUNTER — Ambulatory Visit: Payer: 59

## 2019-06-09 DIAGNOSIS — Z7901 Long term (current) use of anticoagulants: Secondary | ICD-10-CM | POA: Diagnosis not present

## 2019-06-09 DIAGNOSIS — D693 Immune thrombocytopenic purpura: Secondary | ICD-10-CM

## 2019-06-09 DIAGNOSIS — I2699 Other pulmonary embolism without acute cor pulmonale: Secondary | ICD-10-CM | POA: Diagnosis not present

## 2019-06-09 DIAGNOSIS — Z79899 Other long term (current) drug therapy: Secondary | ICD-10-CM | POA: Diagnosis not present

## 2019-06-09 LAB — CBC WITH DIFFERENTIAL (CANCER CENTER ONLY)
Abs Immature Granulocytes: 0.05 10*3/uL (ref 0.00–0.07)
Basophils Absolute: 0.1 10*3/uL (ref 0.0–0.1)
Basophils Relative: 1 %
Eosinophils Absolute: 0.3 10*3/uL (ref 0.0–0.5)
Eosinophils Relative: 2 %
HCT: 39.4 % (ref 36.0–46.0)
Hemoglobin: 13 g/dL (ref 12.0–15.0)
Immature Granulocytes: 1 %
Lymphocytes Relative: 31 %
Lymphs Abs: 3.2 10*3/uL (ref 0.7–4.0)
MCH: 31.4 pg (ref 26.0–34.0)
MCHC: 33 g/dL (ref 30.0–36.0)
MCV: 95.2 fL (ref 80.0–100.0)
Monocytes Absolute: 1 10*3/uL (ref 0.1–1.0)
Monocytes Relative: 9 %
Neutro Abs: 5.8 10*3/uL (ref 1.7–7.7)
Neutrophils Relative %: 56 %
Platelet Count: 41 10*3/uL — ABNORMAL LOW (ref 150–400)
RBC: 4.14 MIL/uL (ref 3.87–5.11)
RDW: 13.6 % (ref 11.5–15.5)
WBC Count: 10.4 10*3/uL (ref 4.0–10.5)
nRBC: 0 % (ref 0.0–0.2)

## 2019-06-09 NOTE — Progress Notes (Signed)
Hematology and Oncology Follow Up Visit  Janice Martin [id number] 1961/06/18 58 y.o. 06/09/2019   Principle Diagnosis:  Chronic immune thrombocytopenia - recurrent Submassive pulmonary embolism, with right lower extremity thrombus  Current Therapy:   Xarelto 10 mg by mouth daily Decadron-taper per protocol for relapsed thrombocytopenia Nplate q weekly for platelet count > 100K   Interim History:  Janice Martin is here today for an unscheduled visit with c/o bleeding from the gums. She was concerned that her platelet count may be dropping.  Platelet count today is improved at 41. Hgb is stable at 13.0, MCV 95 and WBC 10.4. She received Nplate on Monday.  She has not noted any other blood loss. The petechiae on her legs is much improved.  No fever, chills, n/v, cough, rash, dizziness, SOB, chest pain, palpitations, abdominal pain or changes in bowel or bladder habits.  No swelling, tenderness, numbness or tingling on her extremities.  She is eating well and staying hydrated. Her weight is stable.   ECOG Performance Status: 1 - Symptomatic but completely ambulatory  Medications:  Allergies as of 06/09/2019      Reactions   Bee Venom Anaphylaxis, Shortness Of Breath, Swelling   Influenza Vaccines Other (See Comments)   Per pt can't take mess up with her immune system   Morphine Other (See Comments)   Burns really bad   Shellfish Allergy Anaphylaxis      Medication List       Accurate as of June 09, 2019 10:16 AM. If you have any questions, ask your nurse or doctor.        dexamethasone 4 MG tablet Commonly known as: DECADRON TAKE 5 TABLETS BY MOUTH DAILY FOR 4 DAYS, 3 TABLETS FOR 2 DAYS, 1 TABLET FOR 2 DAYS   nebivolol 5 MG tablet Commonly known as: Bystolic Take 1 tablet (5 mg total) by mouth daily.   rivaroxaban 10 MG Tabs tablet Commonly known as: XARELTO Take 1 tablet (10 mg total) by mouth daily with supper.   valsartan-hydrochlorothiazide 320-25 MG tablet  Commonly known as: DIOVAN-HCT Take 1 tablet by mouth daily.   vitamin B-12 100 MCG tablet Commonly known as: CYANOCOBALAMIN Take 100 mcg by mouth at bedtime.       Allergies:  Allergies  Allergen Reactions  . Bee Venom Anaphylaxis, Shortness Of Breath and Swelling  . Influenza Vaccines Other (See Comments)    Per pt can't take mess up with her immune system  . Morphine Other (See Comments)    Burns really bad  . Shellfish Allergy Anaphylaxis    Past Medical History, Surgical history, Social history, and Family History were reviewed and updated.  Review of Systems: All other 10 point review of systems is negative.   Physical Exam:  vitals were not taken for this visit.   Wt Readings from Last 3 Encounters:  05/25/19 199 lb 1.9 oz (90.3 kg)  02/14/19 197 lb (89.4 kg)  11/18/18 191 lb (86.6 kg)    Ocular: Sclerae unicteric, pupils equal, round and reactive to light Ear-nose-throat: Oropharynx clear, dentition fair Lymphatic: No cervical or supraclavicular adenopathy Lungs no rales or rhonchi, good excursion bilaterally Heart regular rate and rhythm, no murmur appreciated Abd soft, nontender, positive bowel sounds, no liver or spleen tip palpated on exam, no fluid wave  MSK no focal spinal tenderness, no joint edema Neuro: non-focal, well-oriented, appropriate affect Breasts: Deferred   Lab Results  Component Value Date   WBC 10.9 (H) 06/06/2019   HGB 14.3  06/06/2019   HCT 41.9 06/06/2019   MCV 91.9 06/06/2019   PLT 21 (L) 06/06/2019   Lab Results  Component Value Date   FERRITIN 138 02/09/2015   IRON 71 02/09/2015   TIBC 238 02/09/2015   UIBC 166 02/09/2015   IRONPCTSAT 30 02/09/2015   Lab Results  Component Value Date   RETICCTPCT 2.35 (H) 02/09/2015   RBC 4.56 06/06/2019   RETICCTABS 95.88 (H) 02/09/2015   No results found for: KPAFRELGTCHN, LAMBDASER, KAPLAMBRATIO No results found for: Kandis Cocking, IGMSERUM Lab Results  Component Value Date    TOTALPROTELP 7.0 06/28/2010   ALBUMINELP 56.3 06/28/2010   A1GS 8.7 (H) 06/28/2010   A2GS 7.9 06/28/2010   BETS 7.5 (H) 06/28/2010   BETA2SER 4.7 06/28/2010   GAMS 14.9 06/28/2010   MSPIKE NOT DET 06/28/2010   SPEI * 06/28/2010     Chemistry      Component Value Date/Time   NA 140 03/07/2019 1505   NA 140 09/17/2017 1507   K 3.8 03/07/2019 1505   K 3.9 09/17/2017 1507   CL 101 03/07/2019 1505   CL 99 04/17/2017 1411   CO2 28 03/07/2019 1505   CO2 30 (H) 09/17/2017 1507   BUN 13 03/07/2019 1505   BUN 8.9 09/17/2017 1507   CREATININE 1.04 (H) 03/07/2019 1505   CREATININE 1.2 (H) 09/17/2017 1507      Component Value Date/Time   CALCIUM 9.9 03/07/2019 1505   CALCIUM 9.3 09/17/2017 1507   ALKPHOS 98 03/07/2019 1505   ALKPHOS 105 09/17/2017 1507   AST 15 03/07/2019 1505   AST 15 09/17/2017 1507   ALT 30 03/07/2019 1505   ALT 18 09/17/2017 1507   BILITOT 0.3 03/07/2019 1505   BILITOT 0.38 09/17/2017 1507       Impression and Plan: Janice Martin is a very pleasant 58 yo caucasian female with relapsed chronic immune thrombocytopenia.  She received Nplate this week on Monday and platelet count is improved at 41 (from 21).  No IVIG needed at this time per Dr. Marin Olp and no changes to Xarelto regimen. We will just continue to monitor with labs and Nplate weekly for now with follow-up September.  She will contact our office with nay questions or concerns. We can certainly see her sooner if needed!  Laverna Peace, NP 8/20/202010:16 AM

## 2019-06-15 ENCOUNTER — Inpatient Hospital Stay: Payer: 59

## 2019-06-15 ENCOUNTER — Encounter: Payer: Self-pay | Admitting: *Deleted

## 2019-06-15 ENCOUNTER — Other Ambulatory Visit: Payer: Self-pay

## 2019-06-15 DIAGNOSIS — D693 Immune thrombocytopenic purpura: Secondary | ICD-10-CM

## 2019-06-15 DIAGNOSIS — Z79899 Other long term (current) drug therapy: Secondary | ICD-10-CM | POA: Diagnosis not present

## 2019-06-15 DIAGNOSIS — I2699 Other pulmonary embolism without acute cor pulmonale: Secondary | ICD-10-CM | POA: Diagnosis not present

## 2019-06-15 DIAGNOSIS — Z7901 Long term (current) use of anticoagulants: Secondary | ICD-10-CM | POA: Diagnosis not present

## 2019-06-15 LAB — CBC WITH DIFFERENTIAL (CANCER CENTER ONLY)
Abs Immature Granulocytes: 0.07 10*3/uL (ref 0.00–0.07)
Basophils Absolute: 0.2 10*3/uL — ABNORMAL HIGH (ref 0.0–0.1)
Basophils Relative: 1 %
Eosinophils Absolute: 0.1 10*3/uL (ref 0.0–0.5)
Eosinophils Relative: 1 %
HCT: 38.4 % (ref 36.0–46.0)
Hemoglobin: 13 g/dL (ref 12.0–15.0)
Immature Granulocytes: 1 %
Lymphocytes Relative: 33 %
Lymphs Abs: 3.9 10*3/uL (ref 0.7–4.0)
MCH: 31.9 pg (ref 26.0–34.0)
MCHC: 33.9 g/dL (ref 30.0–36.0)
MCV: 94.1 fL (ref 80.0–100.0)
Monocytes Absolute: 1.2 10*3/uL — ABNORMAL HIGH (ref 0.1–1.0)
Monocytes Relative: 10 %
Neutro Abs: 6.3 10*3/uL (ref 1.7–7.7)
Neutrophils Relative %: 54 %
Platelet Count: 333 10*3/uL (ref 150–400)
RBC: 4.08 MIL/uL (ref 3.87–5.11)
RDW: 13.8 % (ref 11.5–15.5)
WBC Count: 11.6 10*3/uL — ABNORMAL HIGH (ref 4.0–10.5)
nRBC: 0.3 % — ABNORMAL HIGH (ref 0.0–0.2)

## 2019-06-15 MED ORDER — ROMIPLOSTIM 250 MCG ~~LOC~~ SOLR
45.0000 ug | Freq: Once | SUBCUTANEOUS | Status: AC
  Administered 2019-06-15: 45 ug via SUBCUTANEOUS
  Filled 2019-06-15: qty 0.09

## 2019-06-15 NOTE — Progress Notes (Signed)
Last MD note mentions may hold Nplate for pltc > [id number]. Pltc today is 333 after one dose of Nplate 83mcg/kg , 90 mcg on 06/06/19 for pltc 21.  Reviewed with Dr. Marin Olp and will proceed with Nplate 0.41mcg/kg, [id number] today.  Hardie Pulley, PharmD, BCPS, BCOP

## 2019-06-15 NOTE — Patient Instructions (Signed)
Romiplostim injection What is this medicine? ROMIPLOSTIM (roe mi PLOE stim) helps your body make more platelets. This medicine is used to treat low platelets caused by chronic idiopathic thrombocytopenic purpura (ITP). This medicine may be used for other purposes; ask your health care provider or pharmacist if you have questions. COMMON BRAND NAME(S): Nplate What should I tell my health care provider before I take this medicine? They need to know if you have any of these conditions:  bleeding disorders  bone marrow problem, like blood cancer or myelodysplastic syndrome  history of blood clots  liver disease  surgery to remove your spleen  an unusual or allergic reaction to romiplostim, mannitol, other medicines, foods, dyes, or preservatives  pregnant or trying to get pregnant  breast-feeding How should I use this medicine? This medicine is for injection under the skin. It is given by a health care professional in a hospital or clinic setting. A special MedGuide will be given to you before your injection. Read this information carefully each time. Talk to your pediatrician regarding the use of this medicine in children. While this drug may be prescribed for children as young as 1 year for selected conditions, precautions do apply. Overdosage: If you think you have taken too much of this medicine contact a poison control center or emergency room at once. NOTE: This medicine is only for you. Do not share this medicine with others. What if I miss a dose? It is important not to miss your dose. Call your doctor or health care professional if you are unable to keep an appointment. What may interact with this medicine? Interactions are not expected. This list may not describe all possible interactions. Give your health care provider a list of all the medicines, herbs, non-prescription drugs, or dietary supplements you use. Also tell them if you smoke, drink alcohol, or use illegal drugs.  Some items may interact with your medicine. What should I watch for while using this medicine? Your condition will be monitored carefully while you are receiving this medicine. Visit your prescriber or health care professional for regular checks on your progress and for the needed blood tests. It is important to keep all appointments. What side effects may I notice from receiving this medicine? Side effects that you should report to your doctor or health care professional as soon as possible:  allergic reactions like skin rash, itching or hives, swelling of the face, lips, or tongue  signs and symptoms of bleeding such as bloody or black, tarry stools; red or dark brown urine; spitting up blood or brown material that looks like coffee grounds; red spots on the skin; unusual bruising or bleeding from the eyes, gums, or nose  signs and symptoms of a blood clot such as chest pain; shortness of breath; pain, swelling, or warmth in the leg  signs and symptoms of a stroke like changes in vision; confusion; trouble speaking or understanding; severe headaches; sudden numbness or weakness of the face, arm or leg; trouble walking; dizziness; loss of balance or coordination Side effects that usually do not require medical attention (report to your doctor or health care professional if they continue or are bothersome):  headache  pain in arms and legs  pain in mouth  stomach pain This list may not describe all possible side effects. Call your doctor for medical advice about side effects. You may report side effects to FDA at 1-800-FDA-1088. Where should I keep my medicine? This drug is given in a hospital or clinic   and will not be stored at home. NOTE: This sheet is a summary. It may not cover all possible information. If you have questions about this medicine, talk to your doctor, pharmacist, or health care provider.  2020 Elsevier/Gold Standard (2017-10-05 11:10:55)  

## 2019-06-22 ENCOUNTER — Inpatient Hospital Stay: Payer: 59 | Attending: Hematology & Oncology

## 2019-06-22 ENCOUNTER — Other Ambulatory Visit: Payer: Self-pay

## 2019-06-22 ENCOUNTER — Inpatient Hospital Stay: Payer: 59

## 2019-06-22 VITALS — BP 142/72 | HR 72 | Temp 98.2°F | Resp 18

## 2019-06-22 DIAGNOSIS — R58 Hemorrhage, not elsewhere classified: Secondary | ICD-10-CM | POA: Diagnosis not present

## 2019-06-22 DIAGNOSIS — Z86711 Personal history of pulmonary embolism: Secondary | ICD-10-CM | POA: Diagnosis not present

## 2019-06-22 DIAGNOSIS — D693 Immune thrombocytopenic purpura: Secondary | ICD-10-CM | POA: Diagnosis not present

## 2019-06-22 DIAGNOSIS — Z7901 Long term (current) use of anticoagulants: Secondary | ICD-10-CM | POA: Insufficient documentation

## 2019-06-22 DIAGNOSIS — Z79899 Other long term (current) drug therapy: Secondary | ICD-10-CM | POA: Insufficient documentation

## 2019-06-22 LAB — CBC WITH DIFFERENTIAL (CANCER CENTER ONLY)
Abs Immature Granulocytes: 0 10*3/uL (ref 0.00–0.07)
Basophils Absolute: 0.1 10*3/uL (ref 0.0–0.1)
Basophils Relative: 1 %
Eosinophils Absolute: 1.6 10*3/uL — ABNORMAL HIGH (ref 0.0–0.5)
Eosinophils Relative: 11 %
HCT: 40 % (ref 36.0–46.0)
Hemoglobin: 13.6 g/dL (ref 12.0–15.0)
Lymphocytes Relative: 24 %
Lymphs Abs: 3.6 10*3/uL (ref 0.7–4.0)
MCH: 31.3 pg (ref 26.0–34.0)
MCHC: 34 g/dL (ref 30.0–36.0)
MCV: 92 fL (ref 80.0–100.0)
Monocytes Absolute: 1.3 10*3/uL — ABNORMAL HIGH (ref 0.1–1.0)
Monocytes Relative: 9 %
Neutro Abs: 8.1 10*3/uL (ref 1.7–17.7)
Neutrophils Relative %: 55 %
Platelet Count: 124 10*3/uL — ABNORMAL LOW (ref 150–400)
RBC: 4.35 MIL/uL (ref 3.87–5.11)
RDW: 13.8 % (ref 11.5–15.5)
WBC Count: 14.8 10*3/uL — ABNORMAL HIGH (ref 4.0–10.5)
nRBC: 0.2 % (ref 0.0–0.2)

## 2019-06-22 MED ORDER — ROMIPLOSTIM 250 MCG ~~LOC~~ SOLR
45.0000 ug | Freq: Once | SUBCUTANEOUS | Status: DC
  Filled 2019-06-22: qty 0.09

## 2019-06-22 NOTE — Patient Instructions (Signed)
Romiplostim injection What is this medicine? ROMIPLOSTIM (roe mi PLOE stim) helps your body make more platelets. This medicine is used to treat low platelets caused by chronic idiopathic thrombocytopenic purpura (ITP). This medicine may be used for other purposes; ask your health care provider or pharmacist if you have questions. COMMON BRAND NAME(S): Nplate What should I tell my health care provider before I take this medicine? They need to know if you have any of these conditions:  bleeding disorders  bone marrow problem, like blood cancer or myelodysplastic syndrome  history of blood clots  liver disease  surgery to remove your spleen  an unusual or allergic reaction to romiplostim, mannitol, other medicines, foods, dyes, or preservatives  pregnant or trying to get pregnant  breast-feeding How should I use this medicine? This medicine is for injection under the skin. It is given by a health care professional in a hospital or clinic setting. A special MedGuide will be given to you before your injection. Read this information carefully each time. Talk to your pediatrician regarding the use of this medicine in children. While this drug may be prescribed for children as young as 1 year for selected conditions, precautions do apply. Overdosage: If you think you have taken too much of this medicine contact a poison control center or emergency room at once. NOTE: This medicine is only for you. Do not share this medicine with others. What if I miss a dose? It is important not to miss your dose. Call your doctor or health care professional if you are unable to keep an appointment. What may interact with this medicine? Interactions are not expected. This list may not describe all possible interactions. Give your health care provider a list of all the medicines, herbs, non-prescription drugs, or dietary supplements you use. Also tell them if you smoke, drink alcohol, or use illegal drugs.  Some items may interact with your medicine. What should I watch for while using this medicine? Your condition will be monitored carefully while you are receiving this medicine. Visit your prescriber or health care professional for regular checks on your progress and for the needed blood tests. It is important to keep all appointments. What side effects may I notice from receiving this medicine? Side effects that you should report to your doctor or health care professional as soon as possible:  allergic reactions like skin rash, itching or hives, swelling of the face, lips, or tongue  signs and symptoms of bleeding such as bloody or black, tarry stools; red or dark brown urine; spitting up blood or brown material that looks like coffee grounds; red spots on the skin; unusual bruising or bleeding from the eyes, gums, or nose  signs and symptoms of a blood clot such as chest pain; shortness of breath; pain, swelling, or warmth in the leg  signs and symptoms of a stroke like changes in vision; confusion; trouble speaking or understanding; severe headaches; sudden numbness or weakness of the face, arm or leg; trouble walking; dizziness; loss of balance or coordination Side effects that usually do not require medical attention (report to your doctor or health care professional if they continue or are bothersome):  headache  pain in arms and legs  pain in mouth  stomach pain This list may not describe all possible side effects. Call your doctor for medical advice about side effects. You may report side effects to FDA at 1-800-FDA-1088. Where should I keep my medicine? This drug is given in a hospital or clinic   and will not be stored at home. NOTE: This sheet is a summary. It may not cover all possible information. If you have questions about this medicine, talk to your doctor, pharmacist, or health care provider.  2020 Elsevier/Gold Standard (2017-10-05 11:10:55)  

## 2019-06-29 ENCOUNTER — Other Ambulatory Visit: Payer: Self-pay

## 2019-06-29 ENCOUNTER — Inpatient Hospital Stay: Payer: 59

## 2019-06-29 VITALS — BP 167/74 | HR 85 | Temp 98.2°F | Resp 18

## 2019-06-29 DIAGNOSIS — Z79899 Other long term (current) drug therapy: Secondary | ICD-10-CM | POA: Diagnosis not present

## 2019-06-29 DIAGNOSIS — D693 Immune thrombocytopenic purpura: Secondary | ICD-10-CM | POA: Diagnosis not present

## 2019-06-29 DIAGNOSIS — Z86711 Personal history of pulmonary embolism: Secondary | ICD-10-CM | POA: Diagnosis not present

## 2019-06-29 DIAGNOSIS — Z7901 Long term (current) use of anticoagulants: Secondary | ICD-10-CM | POA: Diagnosis not present

## 2019-06-29 DIAGNOSIS — R58 Hemorrhage, not elsewhere classified: Secondary | ICD-10-CM | POA: Diagnosis not present

## 2019-06-29 LAB — CBC WITH DIFFERENTIAL (CANCER CENTER ONLY)
Abs Immature Granulocytes: 0.06 10*3/uL (ref 0.00–0.07)
Basophils Absolute: 0.2 10*3/uL — ABNORMAL HIGH (ref 0.0–0.1)
Basophils Relative: 2 %
Eosinophils Absolute: 3.4 10*3/uL — ABNORMAL HIGH (ref 0.0–0.5)
Eosinophils Relative: 22 %
HCT: 40.4 % (ref 36.0–46.0)
Hemoglobin: 13.7 g/dL (ref 12.0–15.0)
Immature Granulocytes: 0 %
Lymphocytes Relative: 33 %
Lymphs Abs: 5.2 10*3/uL — ABNORMAL HIGH (ref 0.7–4.0)
MCH: 31.1 pg (ref 26.0–34.0)
MCHC: 33.9 g/dL (ref 30.0–36.0)
MCV: 91.8 fL (ref 80.0–100.0)
Monocytes Absolute: 1.5 10*3/uL — ABNORMAL HIGH (ref 0.1–1.0)
Monocytes Relative: 10 %
Neutro Abs: 5.3 10*3/uL (ref 1.7–7.7)
Neutrophils Relative %: 33 %
Platelet Count: 33 10*3/uL — ABNORMAL LOW (ref 150–400)
RBC: 4.4 MIL/uL (ref 3.87–5.11)
RDW: 13.8 % (ref 11.5–15.5)
WBC Count: 15.8 10*3/uL — ABNORMAL HIGH (ref 4.0–10.5)
nRBC: 0 % (ref 0.0–0.2)

## 2019-06-29 MED ORDER — ROMIPLOSTIM 250 MCG ~~LOC~~ SOLR
90.0000 ug | Freq: Once | SUBCUTANEOUS | Status: AC
  Administered 2019-06-29: 15:00:00 90 ug via SUBCUTANEOUS
  Filled 2019-06-29: qty 0.18

## 2019-06-30 MED FILL — VALSARTAN-HCTZ 320-25 MG TA: 320-25 | 30 days supply | Qty: 30 | Fill #1

## 2019-07-06 ENCOUNTER — Inpatient Hospital Stay: Payer: 59

## 2019-07-06 ENCOUNTER — Other Ambulatory Visit: Payer: Self-pay

## 2019-07-06 ENCOUNTER — Inpatient Hospital Stay (HOSPITAL_BASED_OUTPATIENT_CLINIC_OR_DEPARTMENT_OTHER): Payer: 59 | Admitting: Hematology & Oncology

## 2019-07-06 ENCOUNTER — Encounter: Payer: Self-pay | Admitting: Hematology & Oncology

## 2019-07-06 VITALS — BP 154/80 | HR 85 | Temp 97.5°F | Resp 18 | Wt 205.0 lb

## 2019-07-06 DIAGNOSIS — R58 Hemorrhage, not elsewhere classified: Secondary | ICD-10-CM | POA: Diagnosis not present

## 2019-07-06 DIAGNOSIS — D693 Immune thrombocytopenic purpura: Secondary | ICD-10-CM

## 2019-07-06 DIAGNOSIS — Z7901 Long term (current) use of anticoagulants: Secondary | ICD-10-CM | POA: Diagnosis not present

## 2019-07-06 DIAGNOSIS — Z86711 Personal history of pulmonary embolism: Secondary | ICD-10-CM | POA: Diagnosis not present

## 2019-07-06 DIAGNOSIS — Z79899 Other long term (current) drug therapy: Secondary | ICD-10-CM | POA: Diagnosis not present

## 2019-07-06 LAB — CMP (CANCER CENTER ONLY)
ALT: 32 U/L (ref 0–44)
AST: 27 U/L (ref 15–41)
Albumin: 3.9 g/dL (ref 3.5–5.0)
Alkaline Phosphatase: 77 U/L (ref 38–126)
Anion gap: 7 (ref 5–15)
BUN: 7 mg/dL (ref 6–20)
CO2: 31 mmol/L (ref 22–32)
Calcium: 9.3 mg/dL (ref 8.9–10.3)
Chloride: 101 mmol/L (ref 98–111)
Creatinine: 1.14 mg/dL — ABNORMAL HIGH (ref 0.44–1.00)
GFR, Est AFR Am: >60 mL/min (ref >60)
GFR, Estimated: 54 mL/min — ABNORMAL LOW (ref >60)
Glucose, Bld: 112 mg/dL — ABNORMAL HIGH (ref 70–99)
Potassium: 3.9 mmol/L (ref 3.5–5.1)
Sodium: 139 mmol/L (ref 135–145)
Total Bilirubin: 0.5 mg/dL (ref 0.3–1.2)
Total Protein: 6.5 g/dL (ref 6.5–8.1)

## 2019-07-06 LAB — CBC WITH DIFFERENTIAL (CANCER CENTER ONLY)
Abs Immature Granulocytes: 0 10*3/uL (ref 0.00–0.07)
Basophils Absolute: 0.2 10*3/uL — ABNORMAL HIGH (ref 0.0–0.1)
Basophils Relative: 1 %
Eosinophils Absolute: 3.2 10*3/uL — ABNORMAL HIGH (ref 0.0–0.5)
Eosinophils Relative: 19 %
HCT: 37.1 % (ref 36.0–46.0)
Hemoglobin: 12.2 g/dL (ref 12.0–15.0)
Lymphocytes Relative: 27 %
Lymphs Abs: 4.6 10*3/uL — ABNORMAL HIGH (ref 0.7–4.0)
MCH: 31.2 pg (ref 26.0–34.0)
MCHC: 32.9 g/dL (ref 30.0–36.0)
MCV: 94.9 fL (ref 80.0–100.0)
Monocytes Absolute: 1 10*3/uL (ref 0.1–1.0)
Monocytes Relative: 6 %
Neutro Abs: 8 10*3/uL — ABNORMAL HIGH (ref 1.7–7.7)
Neutrophils Relative %: 47 %
Platelet Count: 92 10*3/uL — ABNORMAL LOW (ref 150–400)
RBC: 3.91 MIL/uL (ref 3.87–5.11)
RDW: 14.5 % (ref 11.5–15.5)
WBC Count: 17.1 10*3/uL — ABNORMAL HIGH (ref 4.0–10.5)
nRBC: 0.2 % (ref 0.0–0.2)

## 2019-07-06 MED ORDER — ROMIPLOSTIM 250 MCG ~~LOC~~ SOLR
90.0000 ug | Freq: Once | SUBCUTANEOUS | Status: AC
  Administered 2019-07-06: 16:00:00 90 ug via SUBCUTANEOUS
  Filled 2019-07-06: qty 0.18

## 2019-07-06 NOTE — Progress Notes (Signed)
Hematology and Oncology Follow Up Visit  Janice Martin [id number] [birth year] 58 y.o. 07/06/2019   Principle Diagnosis:   Chronic immune thrombocytopenia-recurrent  Submassive pulmonary embolism, with right lower extremity thrombus  Current Therapy:    Xarelto 10 mg by mouth daily   Decadron-taper per protocol for relapsed thrombocytopenia  Nplate q weekly for platelet count < 300K     Interim History:  [name] is back for followup.   Surprisingly, we are having a little more difficulty with the ITP.  Her platelet count has been fluctuating.  I think last week, the platelet count was 33,000.  She does respond to Nplate.  We may have to make some dosage adjustments.  She is had a couple episodes of oral bleeding.  I know that she is on low-dose Xarelto.  We have her on Xarelto because she is also hypercoagulable with respect to thromboembolic disease.  She is still working.  She is having no problems with fever.  There is no cough.  There is no nausea or vomiting.  She is having no change in bowel or bladder habits.  Thankfully, she and her husband finally moved into their new house.  They built their house.  I am so happy that they can now enjoyed.  Overall, her performance status is ECOG 1.    Medications:  Current Outpatient Medications:  .  dexamethasone (DECADRON) 4 MG tablet, TAKE 5 TABLETS BY MOUTH DAILY FOR 4 DAYS, 3 TABLETS FOR 2 DAYS, 1 TABLET FOR 2 DAYS, Disp: 100 tablet, Rfl: 2 .  nebivolol (BYSTOLIC) 5 MG tablet, Take 1 tablet (5 mg total) by mouth daily., Disp: 90 tablet, Rfl: 1 .  Rivaroxaban (XARELTO) 10 MG TABS tablet, Take 1 tablet (10 mg total) by mouth daily with supper., Disp: 30 tablet, Rfl: 12 .  valsartan-hydrochlorothiazide (DIOVAN-HCT) 320-25 MG tablet, Take 1 tablet by mouth daily., Disp: 90 tablet, Rfl: 1 .  vitamin B-12 (CYANOCOBALAMIN) 100 MCG tablet, Take 100 mcg by mouth at bedtime. , Disp: , Rfl:   Allergies:  Allergies  Allergen Reactions  .  Bee Venom Anaphylaxis, Shortness Of Breath and Swelling  . Influenza Vaccines Other (See Comments)    Per pt can't take mess up with her immune system  . Morphine Other (See Comments)    Burns really bad  . Shellfish Allergy Anaphylaxis    Past Medical History, Surgical history, Social history, and Family History were reviewed and updated.  Review of Systems: Review of Systems  Constitutional: Negative.   HENT: Negative.   Eyes: Negative.   Respiratory: Negative.   Cardiovascular: Negative.   Gastrointestinal: Negative.   Genitourinary: Negative.   Musculoskeletal: Negative.   Skin: Negative.   Neurological: Negative.   Endo/Heme/Allergies: Negative.   Psychiatric/Behavioral: Negative.     Physical Exam:  weight is 205 lb (93 kg). Her oral temperature is 97.5 F (36.4 C) (abnormal). Her blood pressure is 154/80 (abnormal) and her pulse is 85. Her respiration is 18 and oxygen saturation is 99%.   Physical Exam Vitals signs reviewed.  HENT:     Head: Normocephalic and atraumatic.  Eyes:     Pupils: Pupils are equal, round, and reactive to light.  Neck:     Musculoskeletal: Normal range of motion.  Cardiovascular:     Rate and Rhythm: Normal rate and regular rhythm.     Heart sounds: Normal heart sounds.  Pulmonary:     Effort: Pulmonary effort is normal.     Breath  sounds: Normal breath sounds.  Abdominal:     General: Bowel sounds are normal.     Palpations: Abdomen is soft.     Comments: She has well-healed splenectomy scar.  No fluid wave.  No palpable liver.  No guarding or rebound tenderness.  Musculoskeletal: Normal range of motion.        General: No tenderness or deformity.  Lymphadenopathy:     Cervical: No cervical adenopathy.  Skin:    General: Skin is warm and dry.     Findings: No erythema or rash.  Neurological:     Mental Status: She is alert and oriented to person, place, and time.  Psychiatric:        Behavior: Behavior normal.        Thought  Content: Thought content normal.        Judgment: Judgment normal.     Lab Results  Component Value Date   WBC 17.1 (H) 07/06/2019   HGB 12.2 07/06/2019   HCT 37.1 07/06/2019   MCV 94.9 07/06/2019   PLT 92 (L) 07/06/2019     Chemistry      Component Value Date/Time   NA 139 07/06/2019 1408   NA 140 09/17/2017 1507   K 3.9 07/06/2019 1408   K 3.9 09/17/2017 1507   CL 101 07/06/2019 1408   CL 99 04/17/2017 1411   CO2 31 07/06/2019 1408   CO2 30 (H) 09/17/2017 1507   BUN 7 07/06/2019 1408   BUN 8.9 09/17/2017 1507   CREATININE 1.14 (H) 07/06/2019 1408   CREATININE 1.2 (H) 09/17/2017 1507      Component Value Date/Time   CALCIUM 9.3 07/06/2019 1408   CALCIUM 9.3 09/17/2017 1507   ALKPHOS 77 07/06/2019 1408   ALKPHOS 105 09/17/2017 1507   AST 27 07/06/2019 1408   AST 15 09/17/2017 1507   ALT 32 07/06/2019 1408   ALT 18 09/17/2017 1507   BILITOT 0.5 07/06/2019 1408   BILITOT 0.38 09/17/2017 1507       Impression and Plan: Janice Martin is a 58 year old white female. She has a history of relapsed chronic immune thrombocytopenia.   I saw her blood under the microscope.  She had some Howell-Jolly bodies with some of the red cells.  I do not see any evidence that she has developed a new spleen.  Her platelets were quite prominent.  She had large platelets.  We will continue the Nplate weekly.  I will see her back myself in 6 weeks.     [name], MD 9/16/20204:30 PM

## 2019-07-06 NOTE — Patient Instructions (Signed)
Romiplostim injection What is this medicine? ROMIPLOSTIM (roe mi PLOE stim) helps your body make more platelets. This medicine is used to treat low platelets caused by chronic idiopathic thrombocytopenic purpura (ITP). This medicine may be used for other purposes; ask your health care provider or pharmacist if you have questions. COMMON BRAND NAME(S): Nplate What should I tell my health care provider before I take this medicine? They need to know if you have any of these conditions:  bleeding disorders  bone marrow problem, like blood cancer or myelodysplastic syndrome  history of blood clots  liver disease  surgery to remove your spleen  an unusual or allergic reaction to romiplostim, mannitol, other medicines, foods, dyes, or preservatives  pregnant or trying to get pregnant  breast-feeding How should I use this medicine? This medicine is for injection under the skin. It is given by a health care professional in a hospital or clinic setting. A special MedGuide will be given to you before your injection. Read this information carefully each time. Talk to your pediatrician regarding the use of this medicine in children. While this drug may be prescribed for children as young as 1 year for selected conditions, precautions do apply. Overdosage: If you think you have taken too much of this medicine contact a poison control center or emergency room at once. NOTE: This medicine is only for you. Do not share this medicine with others. What if I miss a dose? It is important not to miss your dose. Call your doctor or health care professional if you are unable to keep an appointment. What may interact with this medicine? Interactions are not expected. This list may not describe all possible interactions. Give your health care provider a list of all the medicines, herbs, non-prescription drugs, or dietary supplements you use. Also tell them if you smoke, drink alcohol, or use illegal drugs.  Some items may interact with your medicine. What should I watch for while using this medicine? Your condition will be monitored carefully while you are receiving this medicine. Visit your prescriber or health care professional for regular checks on your progress and for the needed blood tests. It is important to keep all appointments. What side effects may I notice from receiving this medicine? Side effects that you should report to your doctor or health care professional as soon as possible:  allergic reactions like skin rash, itching or hives, swelling of the face, lips, or tongue  signs and symptoms of bleeding such as bloody or black, tarry stools; red or dark brown urine; spitting up blood or brown material that looks like coffee grounds; red spots on the skin; unusual bruising or bleeding from the eyes, gums, or nose  signs and symptoms of a blood clot such as chest pain; shortness of breath; pain, swelling, or warmth in the leg  signs and symptoms of a stroke like changes in vision; confusion; trouble speaking or understanding; severe headaches; sudden numbness or weakness of the face, arm or leg; trouble walking; dizziness; loss of balance or coordination Side effects that usually do not require medical attention (report to your doctor or health care professional if they continue or are bothersome):  headache  pain in arms and legs  pain in mouth  stomach pain This list may not describe all possible side effects. Call your doctor for medical advice about side effects. You may report side effects to FDA at 1-800-FDA-1088. Where should I keep my medicine? This drug is given in a hospital or clinic   and will not be stored at home. NOTE: This sheet is a summary. It may not cover all possible information. If you have questions about this medicine, talk to your doctor, pharmacist, or health care provider.  2020 Elsevier/Gold Standard (2017-10-05 11:10:55)  

## 2019-07-13 ENCOUNTER — Inpatient Hospital Stay: Payer: 59

## 2019-07-13 ENCOUNTER — Other Ambulatory Visit: Payer: Self-pay

## 2019-07-13 VITALS — BP 148/78 | HR 85 | Temp 98.2°F | Resp 18

## 2019-07-13 DIAGNOSIS — D693 Immune thrombocytopenic purpura: Secondary | ICD-10-CM | POA: Diagnosis not present

## 2019-07-13 DIAGNOSIS — R58 Hemorrhage, not elsewhere classified: Secondary | ICD-10-CM | POA: Diagnosis not present

## 2019-07-13 DIAGNOSIS — Z79899 Other long term (current) drug therapy: Secondary | ICD-10-CM | POA: Diagnosis not present

## 2019-07-13 DIAGNOSIS — Z86711 Personal history of pulmonary embolism: Secondary | ICD-10-CM | POA: Diagnosis not present

## 2019-07-13 DIAGNOSIS — Z7901 Long term (current) use of anticoagulants: Secondary | ICD-10-CM | POA: Diagnosis not present

## 2019-07-13 LAB — CBC WITH DIFFERENTIAL (CANCER CENTER ONLY)
Abs Immature Granulocytes: 0.04 10*3/uL (ref 0.00–0.07)
Basophils Absolute: 0.2 10*3/uL — ABNORMAL HIGH (ref 0.0–0.1)
Basophils Relative: 1 %
Eosinophils Absolute: 0.1 10*3/uL (ref 0.0–0.5)
Eosinophils Relative: 1 %
HCT: 37.7 % (ref 36.0–46.0)
Hemoglobin: 12.6 g/dL (ref 12.0–15.0)
Immature Granulocytes: 0 %
Lymphocytes Relative: 29 %
Lymphs Abs: 4.1 10*3/uL — ABNORMAL HIGH (ref 0.7–4.0)
MCH: 31.3 pg (ref 26.0–34.0)
MCHC: 33.4 g/dL (ref 30.0–36.0)
MCV: 93.8 fL (ref 80.0–100.0)
Monocytes Absolute: 1.3 10*3/uL — ABNORMAL HIGH (ref 0.1–1.0)
Monocytes Relative: 9 %
Neutro Abs: 8.7 10*3/uL — ABNORMAL HIGH (ref 1.7–7.7)
Neutrophils Relative %: 60 %
Platelet Count: 285 10*3/uL (ref 150–400)
RBC: 4.02 MIL/uL (ref 3.87–5.11)
RDW: 14.3 % (ref 11.5–15.5)
WBC Count: 14.4 10*3/uL — ABNORMAL HIGH (ref 4.0–10.5)
nRBC: 0 % (ref 0.0–0.2)

## 2019-07-13 LAB — PLATELET BY CITRATE

## 2019-07-13 LAB — CMP (CANCER CENTER ONLY)
ALT: 22 U/L (ref 0–44)
AST: 17 U/L (ref 15–41)
Albumin: 4 g/dL (ref 3.5–5.0)
Alkaline Phosphatase: 108 U/L (ref 38–126)
Anion gap: 11 (ref 5–15)
BUN: 11 mg/dL (ref 6–20)
CO2: 29 mmol/L (ref 22–32)
Calcium: 9.2 mg/dL (ref 8.9–10.3)
Chloride: 100 mmol/L (ref 98–111)
Creatinine: 1.28 mg/dL — ABNORMAL HIGH (ref 0.44–1.00)
GFR, Est AFR Am: 54 mL/min — ABNORMAL LOW (ref >60)
GFR, Estimated: 47 mL/min — ABNORMAL LOW (ref >60)
Glucose, Bld: 116 mg/dL — ABNORMAL HIGH (ref 70–99)
Potassium: 3.5 mmol/L (ref 3.5–5.1)
Sodium: 140 mmol/L (ref 135–145)
Total Bilirubin: 0.3 mg/dL (ref 0.3–1.2)
Total Protein: 7.3 g/dL (ref 6.5–8.1)

## 2019-07-13 MED ORDER — ROMIPLOSTIM 250 MCG ~~LOC~~ SOLR
45.0000 ug | Freq: Once | SUBCUTANEOUS | Status: AC
  Administered 2019-07-13: 15:00:00 45 ug via SUBCUTANEOUS
  Filled 2019-07-13: qty 0.09

## 2019-07-13 NOTE — Patient Instructions (Signed)
Romiplostim injection What is this medicine? ROMIPLOSTIM (roe mi PLOE stim) helps your body make more platelets. This medicine is used to treat low platelets caused by chronic idiopathic thrombocytopenic purpura (ITP). This medicine may be used for other purposes; ask your health care provider or pharmacist if you have questions. COMMON BRAND NAME(S): Nplate What should I tell my health care provider before I take this medicine? They need to know if you have any of these conditions:  bleeding disorders  bone marrow problem, like blood cancer or myelodysplastic syndrome  history of blood clots  liver disease  surgery to remove your spleen  an unusual or allergic reaction to romiplostim, mannitol, other medicines, foods, dyes, or preservatives  pregnant or trying to get pregnant  breast-feeding How should I use this medicine? This medicine is for injection under the skin. It is given by a health care professional in a hospital or clinic setting. A special MedGuide will be given to you before your injection. Read this information carefully each time. Talk to your pediatrician regarding the use of this medicine in children. While this drug may be prescribed for children as young as 1 year for selected conditions, precautions do apply. Overdosage: If you think you have taken too much of this medicine contact a poison control center or emergency room at once. NOTE: This medicine is only for you. Do not share this medicine with others. What if I miss a dose? It is important not to miss your dose. Call your doctor or health care professional if you are unable to keep an appointment. What may interact with this medicine? Interactions are not expected. This list may not describe all possible interactions. Give your health care provider a list of all the medicines, herbs, non-prescription drugs, or dietary supplements you use. Also tell them if you smoke, drink alcohol, or use illegal drugs.  Some items may interact with your medicine. What should I watch for while using this medicine? Your condition will be monitored carefully while you are receiving this medicine. Visit your prescriber or health care professional for regular checks on your progress and for the needed blood tests. It is important to keep all appointments. What side effects may I notice from receiving this medicine? Side effects that you should report to your doctor or health care professional as soon as possible:  allergic reactions like skin rash, itching or hives, swelling of the face, lips, or tongue  signs and symptoms of bleeding such as bloody or black, tarry stools; red or dark brown urine; spitting up blood or brown material that looks like coffee grounds; red spots on the skin; unusual bruising or bleeding from the eyes, gums, or nose  signs and symptoms of a blood clot such as chest pain; shortness of breath; pain, swelling, or warmth in the leg  signs and symptoms of a stroke like changes in vision; confusion; trouble speaking or understanding; severe headaches; sudden numbness or weakness of the face, arm or leg; trouble walking; dizziness; loss of balance or coordination Side effects that usually do not require medical attention (report to your doctor or health care professional if they continue or are bothersome):  headache  pain in arms and legs  pain in mouth  stomach pain This list may not describe all possible side effects. Call your doctor for medical advice about side effects. You may report side effects to FDA at 1-800-FDA-1088. Where should I keep my medicine? This drug is given in a hospital or clinic   and will not be stored at home. NOTE: This sheet is a summary. It may not cover all possible information. If you have questions about this medicine, talk to your doctor, pharmacist, or health care provider.  2020 Elsevier/Gold Standard (2017-10-05 11:10:55)  

## 2019-07-20 ENCOUNTER — Inpatient Hospital Stay: Payer: 59

## 2019-07-20 VITALS — BP 140/80 | Resp 18

## 2019-07-20 DIAGNOSIS — Z79899 Other long term (current) drug therapy: Secondary | ICD-10-CM | POA: Diagnosis not present

## 2019-07-20 DIAGNOSIS — D693 Immune thrombocytopenic purpura: Secondary | ICD-10-CM | POA: Diagnosis not present

## 2019-07-20 DIAGNOSIS — Z86711 Personal history of pulmonary embolism: Secondary | ICD-10-CM | POA: Diagnosis not present

## 2019-07-20 DIAGNOSIS — R58 Hemorrhage, not elsewhere classified: Secondary | ICD-10-CM | POA: Diagnosis not present

## 2019-07-20 DIAGNOSIS — Z7901 Long term (current) use of anticoagulants: Secondary | ICD-10-CM | POA: Diagnosis not present

## 2019-07-20 LAB — CBC WITH DIFFERENTIAL (CANCER CENTER ONLY)
Abs Immature Granulocytes: 0.03 10*3/uL (ref 0.00–0.07)
Basophils Absolute: 0.2 10*3/uL — ABNORMAL HIGH (ref 0.0–0.1)
Basophils Relative: 2 %
Eosinophils Absolute: 1.4 10*3/uL — ABNORMAL HIGH (ref 0.0–0.5)
Eosinophils Relative: 11 %
HCT: 37.7 % (ref 36.0–46.0)
Hemoglobin: 12.8 g/dL (ref 12.0–15.0)
Immature Granulocytes: 0 %
Lymphocytes Relative: 34 %
Lymphs Abs: 4.4 10*3/uL — ABNORMAL HIGH (ref 0.7–4.0)
MCH: 31.3 pg (ref 26.0–34.0)
MCHC: 34 g/dL (ref 30.0–36.0)
MCV: 92.2 fL (ref 80.0–100.0)
Monocytes Absolute: 1.3 10*3/uL — ABNORMAL HIGH (ref 0.1–1.0)
Monocytes Relative: 10 %
Neutro Abs: 5.8 10*3/uL (ref 1.7–7.7)
Neutrophils Relative %: 43 %
Platelet Count: 73 10*3/uL — ABNORMAL LOW (ref 150–400)
RBC: 4.09 MIL/uL (ref 3.87–5.11)
RDW: 13.5 % (ref 11.5–15.5)
WBC Count: 13 10*3/uL — ABNORMAL HIGH (ref 4.0–10.5)
nRBC: 0.2 % (ref 0.0–0.2)

## 2019-07-20 MED ORDER — ROMIPLOSTIM 250 MCG ~~LOC~~ SOLR
0.9500 ug/kg | Freq: Once | SUBCUTANEOUS | Status: AC
  Administered 2019-07-20: 15:00:00 90 ug via SUBCUTANEOUS
  Filled 2019-07-20: qty 0.18

## 2019-07-20 NOTE — Patient Instructions (Signed)
Romiplostim injection What is this medicine? ROMIPLOSTIM (roe mi PLOE stim) helps your body make more platelets. This medicine is used to treat low platelets caused by chronic idiopathic thrombocytopenic purpura (ITP). This medicine may be used for other purposes; ask your health care provider or pharmacist if you have questions. COMMON BRAND NAME(S): Nplate What should I tell my health care provider before I take this medicine? They need to know if you have any of these conditions:  bleeding disorders  bone marrow problem, like blood cancer or myelodysplastic syndrome  history of blood clots  liver disease  surgery to remove your spleen  an unusual or allergic reaction to romiplostim, mannitol, other medicines, foods, dyes, or preservatives  pregnant or trying to get pregnant  breast-feeding How should I use this medicine? This medicine is for injection under the skin. It is given by a health care professional in a hospital or clinic setting. A special MedGuide will be given to you before your injection. Read this information carefully each time. Talk to your pediatrician regarding the use of this medicine in children. While this drug may be prescribed for children as young as 1 year for selected conditions, precautions do apply. Overdosage: If you think you have taken too much of this medicine contact a poison control center or emergency room at once. NOTE: This medicine is only for you. Do not share this medicine with others. What if I miss a dose? It is important not to miss your dose. Call your doctor or health care professional if you are unable to keep an appointment. What may interact with this medicine? Interactions are not expected. This list may not describe all possible interactions. Give your health care provider a list of all the medicines, herbs, non-prescription drugs, or dietary supplements you use. Also tell them if you smoke, drink alcohol, or use illegal drugs.  Some items may interact with your medicine. What should I watch for while using this medicine? Your condition will be monitored carefully while you are receiving this medicine. Visit your prescriber or health care professional for regular checks on your progress and for the needed blood tests. It is important to keep all appointments. What side effects may I notice from receiving this medicine? Side effects that you should report to your doctor or health care professional as soon as possible:  allergic reactions like skin rash, itching or hives, swelling of the face, lips, or tongue  signs and symptoms of bleeding such as bloody or black, tarry stools; red or dark brown urine; spitting up blood or brown material that looks like coffee grounds; red spots on the skin; unusual bruising or bleeding from the eyes, gums, or nose  signs and symptoms of a blood clot such as chest pain; shortness of breath; pain, swelling, or warmth in the leg  signs and symptoms of a stroke like changes in vision; confusion; trouble speaking or understanding; severe headaches; sudden numbness or weakness of the face, arm or leg; trouble walking; dizziness; loss of balance or coordination Side effects that usually do not require medical attention (report to your doctor or health care professional if they continue or are bothersome):  headache  pain in arms and legs  pain in mouth  stomach pain This list may not describe all possible side effects. Call your doctor for medical advice about side effects. You may report side effects to FDA at 1-800-FDA-1088. Where should I keep my medicine? This drug is given in a hospital or clinic   and will not be stored at home. NOTE: This sheet is a summary. It may not cover all possible information. If you have questions about this medicine, talk to your doctor, pharmacist, or health care provider.  2020 Elsevier/Gold Standard (2017-10-05 11:10:55)  

## 2019-07-27 ENCOUNTER — Other Ambulatory Visit: Payer: Self-pay

## 2019-07-27 ENCOUNTER — Inpatient Hospital Stay: Payer: 59 | Attending: Hematology & Oncology

## 2019-07-27 ENCOUNTER — Inpatient Hospital Stay: Payer: 59

## 2019-07-27 VITALS — BP 148/78 | HR 78 | Temp 98.7°F | Resp 18

## 2019-07-27 DIAGNOSIS — I2699 Other pulmonary embolism without acute cor pulmonale: Secondary | ICD-10-CM | POA: Insufficient documentation

## 2019-07-27 DIAGNOSIS — Z79899 Other long term (current) drug therapy: Secondary | ICD-10-CM | POA: Diagnosis not present

## 2019-07-27 DIAGNOSIS — D693 Immune thrombocytopenic purpura: Secondary | ICD-10-CM | POA: Insufficient documentation

## 2019-07-27 DIAGNOSIS — Z7901 Long term (current) use of anticoagulants: Secondary | ICD-10-CM | POA: Insufficient documentation

## 2019-07-27 LAB — CBC WITH DIFFERENTIAL (CANCER CENTER ONLY)
Abs Immature Granulocytes: 0.2 10*3/uL — ABNORMAL HIGH (ref 0.00–0.07)
Basophils Absolute: 0.2 10*3/uL — ABNORMAL HIGH (ref 0.0–0.1)
Basophils Relative: 1 %
Eosinophils Absolute: 1.1 10*3/uL — ABNORMAL HIGH (ref 0.0–0.5)
Eosinophils Relative: 7 %
HCT: 39.3 % (ref 36.0–46.0)
Hemoglobin: 13 g/dL (ref 12.0–15.0)
Lymphocytes Relative: 28 %
Lymphs Abs: 4.3 10*3/uL — ABNORMAL HIGH (ref 0.7–4.0)
MCH: 31.6 pg (ref 26.0–34.0)
MCHC: 33.1 g/dL (ref 30.0–36.0)
MCV: 95.6 fL (ref 80.0–100.0)
Metamyelocytes Relative: 1 %
Monocytes Absolute: 1.2 10*3/uL — ABNORMAL HIGH (ref 0.1–1.0)
Monocytes Relative: 8 %
Neutro Abs: 8.4 10*3/uL (ref 1.7–17.7)
Neutrophils Relative %: 55 %
Platelet Count: 26 10*3/uL — ABNORMAL LOW (ref 150–400)
RBC: 4.11 MIL/uL (ref 3.87–5.11)
RDW: 14.4 % (ref 11.5–15.5)
WBC Count: 15.2 10*3/uL — ABNORMAL HIGH (ref 4.0–10.5)
nRBC: 0.2 % (ref 0.0–0.2)

## 2019-07-27 MED ORDER — ROMIPLOSTIM 250 MCG ~~LOC~~ SOLR
90.0000 ug | Freq: Once | SUBCUTANEOUS | Status: AC
  Administered 2019-07-27: 90 ug via SUBCUTANEOUS
  Filled 2019-07-27: qty 0.18

## 2019-07-27 NOTE — Patient Instructions (Signed)
Romiplostim injection What is this medicine? ROMIPLOSTIM (roe mi PLOE stim) helps your body make more platelets. This medicine is used to treat low platelets caused by chronic idiopathic thrombocytopenic purpura (ITP). This medicine may be used for other purposes; ask your health care provider or pharmacist if you have questions. COMMON BRAND NAME(S): Nplate What should I tell my health care provider before I take this medicine? They need to know if you have any of these conditions:  bleeding disorders  bone marrow problem, like blood cancer or myelodysplastic syndrome  history of blood clots  liver disease  surgery to remove your spleen  an unusual or allergic reaction to romiplostim, mannitol, other medicines, foods, dyes, or preservatives  pregnant or trying to get pregnant  breast-feeding How should I use this medicine? This medicine is for injection under the skin. It is given by a health care professional in a hospital or clinic setting. A special MedGuide will be given to you before your injection. Read this information carefully each time. Talk to your pediatrician regarding the use of this medicine in children. While this drug may be prescribed for children as young as 1 year for selected conditions, precautions do apply. Overdosage: If you think you have taken too much of this medicine contact a poison control center or emergency room at once. NOTE: This medicine is only for you. Do not share this medicine with others. What if I miss a dose? It is important not to miss your dose. Call your doctor or health care professional if you are unable to keep an appointment. What may interact with this medicine? Interactions are not expected. This list may not describe all possible interactions. Give your health care provider a list of all the medicines, herbs, non-prescription drugs, or dietary supplements you use. Also tell them if you smoke, drink alcohol, or use illegal drugs.  Some items may interact with your medicine. What should I watch for while using this medicine? Your condition will be monitored carefully while you are receiving this medicine. Visit your prescriber or health care professional for regular checks on your progress and for the needed blood tests. It is important to keep all appointments. What side effects may I notice from receiving this medicine? Side effects that you should report to your doctor or health care professional as soon as possible:  allergic reactions like skin rash, itching or hives, swelling of the face, lips, or tongue  signs and symptoms of bleeding such as bloody or black, tarry stools; red or dark brown urine; spitting up blood or brown material that looks like coffee grounds; red spots on the skin; unusual bruising or bleeding from the eyes, gums, or nose  signs and symptoms of a blood clot such as chest pain; shortness of breath; pain, swelling, or warmth in the leg  signs and symptoms of a stroke like changes in vision; confusion; trouble speaking or understanding; severe headaches; sudden numbness or weakness of the face, arm or leg; trouble walking; dizziness; loss of balance or coordination Side effects that usually do not require medical attention (report to your doctor or health care professional if they continue or are bothersome):  headache  pain in arms and legs  pain in mouth  stomach pain This list may not describe all possible side effects. Call your doctor for medical advice about side effects. You may report side effects to FDA at 1-800-FDA-1088. Where should I keep my medicine? This drug is given in a hospital or clinic   and will not be stored at home. NOTE: This sheet is a summary. It may not cover all possible information. If you have questions about this medicine, talk to your doctor, pharmacist, or health care provider.  2020 Elsevier/Gold Standard (2017-10-05 11:10:55)  

## 2019-08-03 ENCOUNTER — Inpatient Hospital Stay: Payer: 59

## 2019-08-03 ENCOUNTER — Other Ambulatory Visit: Payer: Self-pay

## 2019-08-03 VITALS — BP 142/68 | HR 72 | Temp 98.7°F | Resp 18

## 2019-08-03 DIAGNOSIS — D693 Immune thrombocytopenic purpura: Secondary | ICD-10-CM

## 2019-08-03 DIAGNOSIS — Z79899 Other long term (current) drug therapy: Secondary | ICD-10-CM | POA: Diagnosis not present

## 2019-08-03 DIAGNOSIS — I2699 Other pulmonary embolism without acute cor pulmonale: Secondary | ICD-10-CM | POA: Diagnosis not present

## 2019-08-03 DIAGNOSIS — Z7901 Long term (current) use of anticoagulants: Secondary | ICD-10-CM | POA: Diagnosis not present

## 2019-08-03 LAB — CBC WITH DIFFERENTIAL (CANCER CENTER ONLY)
Basophils Absolute: 0.3 10*3/uL — ABNORMAL HIGH (ref 0.0–0.1)
Basophils Relative: 2 %
Eosinophils Absolute: 1.5 10*3/uL — ABNORMAL HIGH (ref 0.0–0.5)
Eosinophils Relative: 11 %
HCT: 40.1 % (ref 36.0–46.0)
Hemoglobin: 13.5 g/dL (ref 12.0–15.0)
Lymphocytes Relative: 22 %
Lymphs Abs: 3.1 10*3/uL (ref 0.7–4.0)
MCH: 31.7 pg (ref 26.0–34.0)
MCHC: 33.7 g/dL (ref 30.0–36.0)
MCV: 94.1 fL (ref 80.0–100.0)
Monocytes Absolute: 1.5 10*3/uL — ABNORMAL HIGH (ref 0.1–1.0)
Monocytes Relative: 11 %
Neutro Abs: 7.5 10*3/uL (ref 1.7–7.7)
Neutrophils Relative %: 54 %
Platelet Count: 140 10*3/uL — ABNORMAL LOW (ref 150–400)
RBC: 4.26 MIL/uL (ref 3.87–5.11)
RDW: 14.1 % (ref 11.5–15.5)
WBC Count: 13.9 10*3/uL — ABNORMAL HIGH (ref 4.0–10.5)
nRBC: 0 % (ref 0.0–0.2)

## 2019-08-03 MED ORDER — ROMIPLOSTIM 250 MCG ~~LOC~~ SOLR
90.0000 ug | Freq: Once | SUBCUTANEOUS | Status: AC
  Administered 2019-08-03: 11:00:00 90 ug via SUBCUTANEOUS
  Filled 2019-08-03: qty 0.18

## 2019-08-10 ENCOUNTER — Other Ambulatory Visit: Payer: Self-pay

## 2019-08-10 ENCOUNTER — Inpatient Hospital Stay: Payer: 59

## 2019-08-10 VITALS — BP 158/71 | HR 90 | Temp 98.3°F | Resp 18

## 2019-08-10 DIAGNOSIS — D693 Immune thrombocytopenic purpura: Secondary | ICD-10-CM

## 2019-08-10 DIAGNOSIS — I2699 Other pulmonary embolism without acute cor pulmonale: Secondary | ICD-10-CM | POA: Diagnosis not present

## 2019-08-10 DIAGNOSIS — Z7901 Long term (current) use of anticoagulants: Secondary | ICD-10-CM | POA: Diagnosis not present

## 2019-08-10 DIAGNOSIS — Z79899 Other long term (current) drug therapy: Secondary | ICD-10-CM | POA: Diagnosis not present

## 2019-08-10 LAB — CBC WITH DIFFERENTIAL (CANCER CENTER ONLY)
Abs Immature Granulocytes: 0.07 10*3/uL (ref 0.00–0.07)
Basophils Absolute: 0.2 10*3/uL — ABNORMAL HIGH (ref 0.0–0.1)
Basophils Relative: 1 %
Eosinophils Absolute: 0.1 10*3/uL (ref 0.0–0.5)
Eosinophils Relative: 0 %
HCT: 37.1 % (ref 36.0–46.0)
Hemoglobin: 12.6 g/dL (ref 12.0–15.0)
Immature Granulocytes: 0 %
Lymphocytes Relative: 21 %
Lymphs Abs: 3.9 10*3/uL (ref 0.7–4.0)
MCH: 31.4 pg (ref 26.0–34.0)
MCHC: 34 g/dL (ref 30.0–36.0)
MCV: 92.5 fL (ref 80.0–100.0)
Monocytes Absolute: 1.5 10*3/uL — ABNORMAL HIGH (ref 0.1–1.0)
Monocytes Relative: 8 %
Neutro Abs: 12.4 10*3/uL — ABNORMAL HIGH (ref 1.7–7.7)
Neutrophils Relative %: 70 %
Platelet Count: 175 10*3/uL (ref 150–400)
RBC: 4.01 MIL/uL (ref 3.87–5.11)
RDW: 13.7 % (ref 11.5–15.5)
WBC Count: 18.1 10*3/uL — ABNORMAL HIGH (ref 4.0–10.5)
nRBC: 0 % (ref 0.0–0.2)

## 2019-08-10 MED ORDER — ROMIPLOSTIM 250 MCG ~~LOC~~ SOLR
90.0000 ug | Freq: Once | SUBCUTANEOUS | Status: AC
  Administered 2019-08-10: 90 ug via SUBCUTANEOUS
  Filled 2019-08-10: qty 0.18

## 2019-08-10 NOTE — Patient Instructions (Signed)
Romiplostim injection What is this medicine? ROMIPLOSTIM (roe mi PLOE stim) helps your body make more platelets. This medicine is used to treat low platelets caused by chronic idiopathic thrombocytopenic purpura (ITP). This medicine may be used for other purposes; ask your health care provider or pharmacist if you have questions. COMMON BRAND NAME(S): Nplate What should I tell my health care provider before I take this medicine? They need to know if you have any of these conditions:  bleeding disorders  bone marrow problem, like blood cancer or myelodysplastic syndrome  history of blood clots  liver disease  surgery to remove your spleen  an unusual or allergic reaction to romiplostim, mannitol, other medicines, foods, dyes, or preservatives  pregnant or trying to get pregnant  breast-feeding How should I use this medicine? This medicine is for injection under the skin. It is given by a health care professional in a hospital or clinic setting. A special MedGuide will be given to you before your injection. Read this information carefully each time. Talk to your pediatrician regarding the use of this medicine in children. While this drug may be prescribed for children as young as 1 year for selected conditions, precautions do apply. Overdosage: If you think you have taken too much of this medicine contact a poison control center or emergency room at once. NOTE: This medicine is only for you. Do not share this medicine with others. What if I miss a dose? It is important not to miss your dose. Call your doctor or health care professional if you are unable to keep an appointment. What may interact with this medicine? Interactions are not expected. This list may not describe all possible interactions. Give your health care provider a list of all the medicines, herbs, non-prescription drugs, or dietary supplements you use. Also tell them if you smoke, drink alcohol, or use illegal drugs.  Some items may interact with your medicine. What should I watch for while using this medicine? Your condition will be monitored carefully while you are receiving this medicine. Visit your prescriber or health care professional for regular checks on your progress and for the needed blood tests. It is important to keep all appointments. What side effects may I notice from receiving this medicine? Side effects that you should report to your doctor or health care professional as soon as possible:  allergic reactions like skin rash, itching or hives, swelling of the face, lips, or tongue  signs and symptoms of bleeding such as bloody or black, tarry stools; red or dark brown urine; spitting up blood or brown material that looks like coffee grounds; red spots on the skin; unusual bruising or bleeding from the eyes, gums, or nose  signs and symptoms of a blood clot such as chest pain; shortness of breath; pain, swelling, or warmth in the leg  signs and symptoms of a stroke like changes in vision; confusion; trouble speaking or understanding; severe headaches; sudden numbness or weakness of the face, arm or leg; trouble walking; dizziness; loss of balance or coordination Side effects that usually do not require medical attention (report to your doctor or health care professional if they continue or are bothersome):  headache  pain in arms and legs  pain in mouth  stomach pain This list may not describe all possible side effects. Call your doctor for medical advice about side effects. You may report side effects to FDA at 1-800-FDA-1088. Where should I keep my medicine? This drug is given in a hospital or clinic   and will not be stored at home. NOTE: This sheet is a summary. It may not cover all possible information. If you have questions about this medicine, talk to your doctor, pharmacist, or health care provider.  2020 Elsevier/Gold Standard (2017-10-05 11:10:55)  

## 2019-08-16 MED FILL — VALSARTAN-HCTZ 320-25 MG TA: 320-25 | 30 days supply | Qty: 30 | Fill #2

## 2019-08-17 ENCOUNTER — Encounter: Payer: Self-pay | Admitting: Hematology & Oncology

## 2019-08-17 ENCOUNTER — Telehealth: Payer: Self-pay | Admitting: Hematology & Oncology

## 2019-08-17 ENCOUNTER — Other Ambulatory Visit: Payer: Self-pay

## 2019-08-17 ENCOUNTER — Other Ambulatory Visit: Payer: 59

## 2019-08-17 ENCOUNTER — Inpatient Hospital Stay: Payer: 59

## 2019-08-17 ENCOUNTER — Ambulatory Visit: Payer: 59

## 2019-08-17 ENCOUNTER — Inpatient Hospital Stay (HOSPITAL_BASED_OUTPATIENT_CLINIC_OR_DEPARTMENT_OTHER): Payer: 59 | Admitting: Hematology & Oncology

## 2019-08-17 VITALS — BP 128/81 | HR 85 | Temp 97.3°F | Resp 20 | Wt 197.4 lb

## 2019-08-17 DIAGNOSIS — D693 Immune thrombocytopenic purpura: Secondary | ICD-10-CM

## 2019-08-17 DIAGNOSIS — Z7901 Long term (current) use of anticoagulants: Secondary | ICD-10-CM | POA: Diagnosis not present

## 2019-08-17 DIAGNOSIS — I2699 Other pulmonary embolism without acute cor pulmonale: Secondary | ICD-10-CM | POA: Diagnosis not present

## 2019-08-17 DIAGNOSIS — Z79899 Other long term (current) drug therapy: Secondary | ICD-10-CM | POA: Diagnosis not present

## 2019-08-17 LAB — CBC WITH DIFFERENTIAL (CANCER CENTER ONLY)
Abs Immature Granulocytes: 0 10*3/uL (ref 0.00–0.07)
Basophils Absolute: 0.4 10*3/uL — ABNORMAL HIGH (ref 0.0–0.1)
Basophils Relative: 3 %
Eosinophils Absolute: 1.5 10*3/uL — ABNORMAL HIGH (ref 0.0–0.5)
Eosinophils Relative: 12 %
HCT: 36.7 % (ref 36.0–46.0)
Hemoglobin: 12.3 g/dL (ref 12.0–15.0)
Lymphocytes Relative: 37 %
Lymphs Abs: 4.7 10*3/uL — ABNORMAL HIGH (ref 0.7–4.0)
MCH: 31.3 pg (ref 26.0–34.0)
MCHC: 33.5 g/dL (ref 30.0–36.0)
MCV: 93.4 fL (ref 80.0–100.0)
Monocytes Absolute: 0.8 10*3/uL (ref 0.1–1.0)
Monocytes Relative: 6 %
Neutro Abs: 5.3 10*3/uL (ref 1.7–7.7)
Neutrophils Relative %: 42 %
Platelet Count: 57 10*3/uL — ABNORMAL LOW (ref 150–400)
RBC: 3.93 MIL/uL (ref 3.87–5.11)
RDW: 13.5 % (ref 11.5–15.5)
WBC Count: 12.6 10*3/uL — ABNORMAL HIGH (ref 4.0–10.5)
nRBC: 0 % (ref 0.0–0.2)

## 2019-08-17 MED ORDER — ROMIPLOSTIM 250 MCG ~~LOC~~ SOLR
1.0000 ug/kg | Freq: Once | SUBCUTANEOUS | Status: AC
  Administered 2019-08-17: 90 ug via SUBCUTANEOUS
  Filled 2019-08-17: qty 0.18

## 2019-08-17 NOTE — Patient Instructions (Signed)
Romiplostim injection What is this medicine? ROMIPLOSTIM (roe mi PLOE stim) helps your body make more platelets. This medicine is used to treat low platelets caused by chronic idiopathic thrombocytopenic purpura (ITP). This medicine may be used for other purposes; ask your health care provider or pharmacist if you have questions. COMMON BRAND NAME(S): Nplate What should I tell my health care provider before I take this medicine? They need to know if you have any of these conditions:  bleeding disorders  bone marrow problem, like blood cancer or myelodysplastic syndrome  history of blood clots  liver disease  surgery to remove your spleen  an unusual or allergic reaction to romiplostim, mannitol, other medicines, foods, dyes, or preservatives  pregnant or trying to get pregnant  breast-feeding How should I use this medicine? This medicine is for injection under the skin. It is given by a health care professional in a hospital or clinic setting. A special MedGuide will be given to you before your injection. Read this information carefully each time. Talk to your pediatrician regarding the use of this medicine in children. While this drug may be prescribed for children as young as 1 year for selected conditions, precautions do apply. Overdosage: If you think you have taken too much of this medicine contact a poison control center or emergency room at once. NOTE: This medicine is only for you. Do not share this medicine with others. What if I miss a dose? It is important not to miss your dose. Call your doctor or health care professional if you are unable to keep an appointment. What may interact with this medicine? Interactions are not expected. This list may not describe all possible interactions. Give your health care provider a list of all the medicines, herbs, non-prescription drugs, or dietary supplements you use. Also tell them if you smoke, drink alcohol, or use illegal drugs.  Some items may interact with your medicine. What should I watch for while using this medicine? Your condition will be monitored carefully while you are receiving this medicine. Visit your prescriber or health care professional for regular checks on your progress and for the needed blood tests. It is important to keep all appointments. What side effects may I notice from receiving this medicine? Side effects that you should report to your doctor or health care professional as soon as possible:  allergic reactions like skin rash, itching or hives, swelling of the face, lips, or tongue  signs and symptoms of bleeding such as bloody or black, tarry stools; red or dark brown urine; spitting up blood or brown material that looks like coffee grounds; red spots on the skin; unusual bruising or bleeding from the eyes, gums, or nose  signs and symptoms of a blood clot such as chest pain; shortness of breath; pain, swelling, or warmth in the leg  signs and symptoms of a stroke like changes in vision; confusion; trouble speaking or understanding; severe headaches; sudden numbness or weakness of the face, arm or leg; trouble walking; dizziness; loss of balance or coordination Side effects that usually do not require medical attention (report to your doctor or health care professional if they continue or are bothersome):  headache  pain in arms and legs  pain in mouth  stomach pain This list may not describe all possible side effects. Call your doctor for medical advice about side effects. You may report side effects to FDA at 1-800-FDA-1088. Where should I keep my medicine? This drug is given in a hospital or clinic   and will not be stored at home. NOTE: This sheet is a summary. It may not cover all possible information. If you have questions about this medicine, talk to your doctor, pharmacist, or health care provider.  2020 Elsevier/Gold Standard (2017-10-05 11:10:55)  

## 2019-08-17 NOTE — Telephone Encounter (Signed)
Appointments scheduled calendar was printed and given to patient per 10/28 los

## 2019-08-17 NOTE — Progress Notes (Signed)
Hematology and Oncology Follow Up Visit  Janice Martin [id number] [birth year] 58 y.o. 08/17/2019   Principle Diagnosis:   Chronic immune thrombocytopenia-recurrent  Submassive pulmonary embolism, with right lower extremity thrombus  Current Therapy:    Xarelto 10 mg by mouth daily   Decadron-taper per protocol for relapsed thrombocytopenia  Nplate q weekly for platelet count < 300K     Interim History:  [name] is back for followup.  She is doing okay.  Her platelet count is down to 57,000 today.  Her blood count has been a little more labile.  She has had no bleeding.  She is still on the Xarelto.  She is on 10 mg daily of Xarelto.  She has had no bleeding.  There is been no bruising.  She has had no nausea or vomiting.  She has had no pain.  There is been no issues with her appetite.  Thankfully, she is already voted.  She and her husband are planning to go to the mountains this weekend.    Overall, her performance status is ECOG 1.    Medications:  Current Outpatient Medications:  .  nebivolol (BYSTOLIC) 5 MG tablet, Take 1 tablet (5 mg total) by mouth daily., Disp: 90 tablet, Rfl: 1 .  Rivaroxaban (XARELTO) 10 MG TABS tablet, Take 1 tablet (10 mg total) by mouth daily with supper., Disp: 30 tablet, Rfl: 12 .  valsartan-hydrochlorothiazide (DIOVAN-HCT) 320-25 MG tablet, Take 1 tablet by mouth daily., Disp: 90 tablet, Rfl: 1 .  vitamin B-12 (CYANOCOBALAMIN) 100 MCG tablet, Take 100 mcg by mouth at bedtime. , Disp: , Rfl:  .  dexamethasone (DECADRON) 4 MG tablet, TAKE 5 TABLETS BY MOUTH DAILY FOR 4 DAYS, 3 TABLETS FOR 2 DAYS, 1 TABLET FOR 2 DAYS (Patient not taking: Reported on 08/17/2019), Disp: 100 tablet, Rfl: 2  Allergies:  Allergies  Allergen Reactions  . Bee Venom Anaphylaxis, Shortness Of Breath and Swelling  . Influenza Vaccines Other (See Comments)    Per pt can't take mess up with her immune system  . Morphine Other (See Comments)    Burns really bad  .  Shellfish Allergy Anaphylaxis    Past Medical History, Surgical history, Social history, and Family History were reviewed and updated.  Review of Systems: Review of Systems  Constitutional: Negative.   HENT: Negative.   Eyes: Negative.   Respiratory: Negative.   Cardiovascular: Negative.   Gastrointestinal: Negative.   Genitourinary: Negative.   Musculoskeletal: Negative.   Skin: Negative.   Neurological: Negative.   Endo/Heme/Allergies: Negative.   Psychiatric/Behavioral: Negative.     Physical Exam:  weight is 197 lb 6.4 oz (89.5 kg). Her temporal temperature is 97.3 F (36.3 C) (abnormal). Her blood pressure is 128/81 and her pulse is 85. Her respiration is 20 and oxygen saturation is 97%.   Physical Exam Vitals signs reviewed.  HENT:     Head: Normocephalic and atraumatic.  Eyes:     Pupils: Pupils are equal, round, and reactive to light.  Neck:     Musculoskeletal: Normal range of motion.  Cardiovascular:     Rate and Rhythm: Normal rate and regular rhythm.     Heart sounds: Normal heart sounds.  Pulmonary:     Effort: Pulmonary effort is normal.     Breath sounds: Normal breath sounds.  Abdominal:     General: Bowel sounds are normal.     Palpations: Abdomen is soft.     Comments: She has well-healed splenectomy scar.  No fluid wave.  No palpable liver.  No guarding or rebound tenderness.  Musculoskeletal: Normal range of motion.        General: No tenderness or deformity.  Lymphadenopathy:     Cervical: No cervical adenopathy.  Skin:    General: Skin is warm and dry.     Findings: No erythema or rash.  Neurological:     Mental Status: She is alert and oriented to person, place, and time.  Psychiatric:        Behavior: Behavior normal.        Thought Content: Thought content normal.        Judgment: Judgment normal.     Lab Results  Component Value Date   WBC 12.6 (H) 08/17/2019   HGB 12.3 08/17/2019   HCT 36.7 08/17/2019   MCV 93.4 08/17/2019    PLT 57 (L) 08/17/2019     Chemistry      Component Value Date/Time   NA 140 07/13/2019 1350   NA 140 09/17/2017 1507   K 3.5 07/13/2019 1350   K 3.9 09/17/2017 1507   CL 100 07/13/2019 1350   CL 99 04/17/2017 1411   CO2 29 07/13/2019 1350   CO2 30 (H) 09/17/2017 1507   BUN 11 07/13/2019 1350   BUN 8.9 09/17/2017 1507   CREATININE 1.28 (H) 07/13/2019 1350   CREATININE 1.2 (H) 09/17/2017 1507      Component Value Date/Time   CALCIUM 9.2 07/13/2019 1350   CALCIUM 9.3 09/17/2017 1507   ALKPHOS 108 07/13/2019 1350   ALKPHOS 105 09/17/2017 1507   AST 17 07/13/2019 1350   AST 15 09/17/2017 1507   ALT 22 07/13/2019 1350   ALT 18 09/17/2017 1507   BILITOT 0.3 07/13/2019 1350   BILITOT 0.38 09/17/2017 1507       Impression and Plan: Janice Martin is a 58 year old white female. She has a history of relapsed chronic immune thrombocytopenia.   I saw her blood under the microscope.  She had some Howell-Jolly bodies with some of the red cells.  I do not see any evidence that she has developed a new spleen.  Her platelets were quite prominent.  She had large platelets.  We will continue the Nplate weekly.  I would not change the dose of Nplate despite the fact that her platelet count is 57,000.  I would like to see her back right before Thanksgiving so we can see that everything is okay with her platelets.   [name], MD 10/28/20202:55 PM

## 2019-08-24 ENCOUNTER — Other Ambulatory Visit: Payer: Self-pay

## 2019-08-24 ENCOUNTER — Inpatient Hospital Stay: Payer: 59 | Attending: Hematology & Oncology

## 2019-08-24 ENCOUNTER — Inpatient Hospital Stay: Payer: 59

## 2019-08-24 VITALS — BP 145/63 | HR 79 | Temp 98.0°F | Resp 18

## 2019-08-24 DIAGNOSIS — I2699 Other pulmonary embolism without acute cor pulmonale: Secondary | ICD-10-CM | POA: Diagnosis not present

## 2019-08-24 DIAGNOSIS — R5383 Other fatigue: Secondary | ICD-10-CM | POA: Insufficient documentation

## 2019-08-24 DIAGNOSIS — D693 Immune thrombocytopenic purpura: Secondary | ICD-10-CM

## 2019-08-24 DIAGNOSIS — Z7901 Long term (current) use of anticoagulants: Secondary | ICD-10-CM | POA: Diagnosis not present

## 2019-08-24 DIAGNOSIS — Z79899 Other long term (current) drug therapy: Secondary | ICD-10-CM | POA: Insufficient documentation

## 2019-08-24 LAB — CBC WITH DIFFERENTIAL (CANCER CENTER ONLY)
Abs Immature Granulocytes: 0.08 10*3/uL — ABNORMAL HIGH (ref 0.00–0.07)
Basophils Absolute: 0.2 10*3/uL — ABNORMAL HIGH (ref 0.0–0.1)
Basophils Relative: 2 %
Eosinophils Absolute: 0.1 10*3/uL (ref 0.0–0.5)
Eosinophils Relative: 0 %
HCT: 37.7 % (ref 36.0–46.0)
Hemoglobin: 12.6 g/dL (ref 12.0–15.0)
Immature Granulocytes: 1 %
Lymphocytes Relative: 29 %
Lymphs Abs: 4.1 10*3/uL — ABNORMAL HIGH (ref 0.7–4.0)
MCH: 31.3 pg (ref 26.0–34.0)
MCHC: 33.4 g/dL (ref 30.0–36.0)
MCV: 93.5 fL (ref 80.0–100.0)
Monocytes Absolute: 1.3 10*3/uL — ABNORMAL HIGH (ref 0.1–1.0)
Monocytes Relative: 9 %
Neutro Abs: 8.4 10*3/uL — ABNORMAL HIGH (ref 1.7–7.7)
Neutrophils Relative %: 59 %
Platelet Count: 55 10*3/uL — ABNORMAL LOW (ref 150–400)
RBC Morphology: 2
RBC: 4.03 MIL/uL (ref 3.87–5.11)
RDW: 14 % (ref 11.5–15.5)
WBC Count: 14.2 10*3/uL — ABNORMAL HIGH (ref 4.0–10.5)
nRBC: 0.4 % — ABNORMAL HIGH (ref 0.0–0.2)

## 2019-08-24 LAB — CMP (CANCER CENTER ONLY)
ALT: 23 U/L (ref 0–44)
AST: 19 U/L (ref 15–41)
Albumin: 3.8 g/dL (ref 3.5–5.0)
Alkaline Phosphatase: 104 U/L (ref 38–126)
Anion gap: 12 (ref 5–15)
BUN: 10 mg/dL (ref 6–20)
CO2: 28 mmol/L (ref 22–32)
Calcium: 9.3 mg/dL (ref 8.9–10.3)
Chloride: 100 mmol/L (ref 98–111)
Creatinine: 1.12 mg/dL — ABNORMAL HIGH (ref 0.44–1.00)
GFR, Est AFR Am: >60 mL/min (ref >60)
GFR, Estimated: 55 mL/min — ABNORMAL LOW (ref >60)
Glucose, Bld: 102 mg/dL — ABNORMAL HIGH (ref 70–99)
Potassium: 3.9 mmol/L (ref 3.5–5.1)
Sodium: 140 mmol/L (ref 135–145)
Total Bilirubin: 0.3 mg/dL (ref 0.3–1.2)
Total Protein: 7.2 g/dL (ref 6.5–8.1)

## 2019-08-24 LAB — SAVE SMEAR(SSMR), FOR PROVIDER SLIDE REVIEW

## 2019-08-24 LAB — PLATELET BY CITRATE

## 2019-08-24 MED ORDER — ROMIPLOSTIM 250 MCG ~~LOC~~ SOLR
1.0000 ug/kg | Freq: Once | SUBCUTANEOUS | Status: AC
  Administered 2019-08-24: 90 ug via SUBCUTANEOUS
  Filled 2019-08-24: qty 0.18

## 2019-08-24 NOTE — Patient Instructions (Signed)
Romiplostim injection What is this medicine? ROMIPLOSTIM (roe mi PLOE stim) helps your body make more platelets. This medicine is used to treat low platelets caused by chronic idiopathic thrombocytopenic purpura (ITP). This medicine may be used for other purposes; ask your health care provider or pharmacist if you have questions. COMMON BRAND NAME(S): Nplate What should I tell my health care provider before I take this medicine? They need to know if you have any of these conditions:  bleeding disorders  bone marrow problem, like blood cancer or myelodysplastic syndrome  history of blood clots  liver disease  surgery to remove your spleen  an unusual or allergic reaction to romiplostim, mannitol, other medicines, foods, dyes, or preservatives  pregnant or trying to get pregnant  breast-feeding How should I use this medicine? This medicine is for injection under the skin. It is given by a health care professional in a hospital or clinic setting. A special MedGuide will be given to you before your injection. Read this information carefully each time. Talk to your pediatrician regarding the use of this medicine in children. While this drug may be prescribed for children as young as 1 year for selected conditions, precautions do apply. Overdosage: If you think you have taken too much of this medicine contact a poison control center or emergency room at once. NOTE: This medicine is only for you. Do not share this medicine with others. What if I miss a dose? It is important not to miss your dose. Call your doctor or health care professional if you are unable to keep an appointment. What may interact with this medicine? Interactions are not expected. This list may not describe all possible interactions. Give your health care provider a list of all the medicines, herbs, non-prescription drugs, or dietary supplements you use. Also tell them if you smoke, drink alcohol, or use illegal drugs.  Some items may interact with your medicine. What should I watch for while using this medicine? Your condition will be monitored carefully while you are receiving this medicine. Visit your prescriber or health care professional for regular checks on your progress and for the needed blood tests. It is important to keep all appointments. What side effects may I notice from receiving this medicine? Side effects that you should report to your doctor or health care professional as soon as possible:  allergic reactions like skin rash, itching or hives, swelling of the face, lips, or tongue  signs and symptoms of bleeding such as bloody or black, tarry stools; red or dark brown urine; spitting up blood or brown material that looks like coffee grounds; red spots on the skin; unusual bruising or bleeding from the eyes, gums, or nose  signs and symptoms of a blood clot such as chest pain; shortness of breath; pain, swelling, or warmth in the leg  signs and symptoms of a stroke like changes in vision; confusion; trouble speaking or understanding; severe headaches; sudden numbness or weakness of the face, arm or leg; trouble walking; dizziness; loss of balance or coordination Side effects that usually do not require medical attention (report to your doctor or health care professional if they continue or are bothersome):  headache  pain in arms and legs  pain in mouth  stomach pain This list may not describe all possible side effects. Call your doctor for medical advice about side effects. You may report side effects to FDA at 1-800-FDA-1088. Where should I keep my medicine? This drug is given in a hospital or clinic   and will not be stored at home. NOTE: This sheet is a summary. It may not cover all possible information. If you have questions about this medicine, talk to your doctor, pharmacist, or health care provider.  2020 Elsevier/Gold Standard (2017-10-05 11:10:55)  

## 2019-08-30 ENCOUNTER — Other Ambulatory Visit: Payer: Self-pay | Admitting: Hematology & Oncology

## 2019-08-30 DIAGNOSIS — I2602 Saddle embolus of pulmonary artery with acute cor pulmonale: Secondary | ICD-10-CM

## 2019-08-30 DIAGNOSIS — I2782 Chronic pulmonary embolism: Secondary | ICD-10-CM

## 2019-08-30 DIAGNOSIS — I2692 Saddle embolus of pulmonary artery without acute cor pulmonale: Secondary | ICD-10-CM

## 2019-08-30 DIAGNOSIS — I825Z2 Chronic embolism and thrombosis of unspecified deep veins of left distal lower extremity: Secondary | ICD-10-CM

## 2019-08-30 DIAGNOSIS — D693 Immune thrombocytopenic purpura: Secondary | ICD-10-CM

## 2019-08-30 MED FILL — XARELTO 10 MG TABLET: 10 | 90 days supply | Qty: 90 | Fill #0

## 2019-08-31 ENCOUNTER — Inpatient Hospital Stay: Payer: 59

## 2019-08-31 ENCOUNTER — Ambulatory Visit: Payer: 59

## 2019-08-31 ENCOUNTER — Other Ambulatory Visit: Payer: Self-pay

## 2019-08-31 ENCOUNTER — Other Ambulatory Visit: Payer: 59

## 2019-08-31 VITALS — BP 160/76 | HR 90 | Temp 97.8°F | Resp 18

## 2019-08-31 DIAGNOSIS — D693 Immune thrombocytopenic purpura: Secondary | ICD-10-CM | POA: Diagnosis not present

## 2019-08-31 DIAGNOSIS — Z7901 Long term (current) use of anticoagulants: Secondary | ICD-10-CM | POA: Diagnosis not present

## 2019-08-31 DIAGNOSIS — R5383 Other fatigue: Secondary | ICD-10-CM | POA: Diagnosis not present

## 2019-08-31 DIAGNOSIS — Z79899 Other long term (current) drug therapy: Secondary | ICD-10-CM | POA: Diagnosis not present

## 2019-08-31 DIAGNOSIS — I2699 Other pulmonary embolism without acute cor pulmonale: Secondary | ICD-10-CM | POA: Diagnosis not present

## 2019-08-31 LAB — CBC WITH DIFFERENTIAL (CANCER CENTER ONLY)
Abs Immature Granulocytes: 0.09 10*3/uL — ABNORMAL HIGH (ref 0.00–0.07)
Basophils Absolute: 0.2 10*3/uL — ABNORMAL HIGH (ref 0.0–0.1)
Basophils Relative: 1 %
Eosinophils Absolute: 1.8 10*3/uL — ABNORMAL HIGH (ref 0.0–0.5)
Eosinophils Relative: 9 %
HCT: 36.9 % (ref 36.0–46.0)
Hemoglobin: 12.6 g/dL (ref 12.0–15.0)
Immature Granulocytes: 1 %
Lymphocytes Relative: 17 %
Lymphs Abs: 3.3 10*3/uL (ref 0.7–4.0)
MCH: 31 pg (ref 26.0–34.0)
MCHC: 34.1 g/dL (ref 30.0–36.0)
MCV: 90.9 fL (ref 80.0–100.0)
Monocytes Absolute: 1.5 10*3/uL — ABNORMAL HIGH (ref 0.1–1.0)
Monocytes Relative: 8 %
Neutro Abs: 12.1 10*3/uL — ABNORMAL HIGH (ref 1.7–7.7)
Neutrophils Relative %: 64 %
Platelet Count: 183 10*3/uL (ref 150–400)
RBC: 4.06 MIL/uL (ref 3.87–5.11)
RDW: 13.6 % (ref 11.5–15.5)
WBC Count: 18.9 10*3/uL — ABNORMAL HIGH (ref 4.0–10.5)
nRBC: 0 % (ref 0.0–0.2)

## 2019-08-31 MED ORDER — ROMIPLOSTIM 250 MCG ~~LOC~~ SOLR
1.0000 ug/kg | Freq: Once | SUBCUTANEOUS | Status: AC
  Administered 2019-08-31: 90 ug via SUBCUTANEOUS
  Filled 2019-08-31: qty 0.18

## 2019-08-31 NOTE — Patient Instructions (Signed)
Romiplostim injection What is this medicine? ROMIPLOSTIM (roe mi PLOE stim) helps your body make more platelets. This medicine is used to treat low platelets caused by chronic idiopathic thrombocytopenic purpura (ITP). This medicine may be used for other purposes; ask your health care provider or pharmacist if you have questions. COMMON BRAND NAME(S): Nplate What should I tell my health care provider before I take this medicine? They need to know if you have any of these conditions:  bleeding disorders  bone marrow problem, like blood cancer or myelodysplastic syndrome  history of blood clots  liver disease  surgery to remove your spleen  an unusual or allergic reaction to romiplostim, mannitol, other medicines, foods, dyes, or preservatives  pregnant or trying to get pregnant  breast-feeding How should I use this medicine? This medicine is for injection under the skin. It is given by a health care professional in a hospital or clinic setting. A special MedGuide will be given to you before your injection. Read this information carefully each time. Talk to your pediatrician regarding the use of this medicine in children. While this drug may be prescribed for children as young as 1 year for selected conditions, precautions do apply. Overdosage: If you think you have taken too much of this medicine contact a poison control center or emergency room at once. NOTE: This medicine is only for you. Do not share this medicine with others. What if I miss a dose? It is important not to miss your dose. Call your doctor or health care professional if you are unable to keep an appointment. What may interact with this medicine? Interactions are not expected. This list may not describe all possible interactions. Give your health care provider a list of all the medicines, herbs, non-prescription drugs, or dietary supplements you use. Also tell them if you smoke, drink alcohol, or use illegal drugs.  Some items may interact with your medicine. What should I watch for while using this medicine? Your condition will be monitored carefully while you are receiving this medicine. Visit your prescriber or health care professional for regular checks on your progress and for the needed blood tests. It is important to keep all appointments. What side effects may I notice from receiving this medicine? Side effects that you should report to your doctor or health care professional as soon as possible:  allergic reactions like skin rash, itching or hives, swelling of the face, lips, or tongue  signs and symptoms of bleeding such as bloody or black, tarry stools; red or dark brown urine; spitting up blood or brown material that looks like coffee grounds; red spots on the skin; unusual bruising or bleeding from the eyes, gums, or nose  signs and symptoms of a blood clot such as chest pain; shortness of breath; pain, swelling, or warmth in the leg  signs and symptoms of a stroke like changes in vision; confusion; trouble speaking or understanding; severe headaches; sudden numbness or weakness of the face, arm or leg; trouble walking; dizziness; loss of balance or coordination Side effects that usually do not require medical attention (report to your doctor or health care professional if they continue or are bothersome):  headache  pain in arms and legs  pain in mouth  stomach pain This list may not describe all possible side effects. Call your doctor for medical advice about side effects. You may report side effects to FDA at 1-800-FDA-1088. Where should I keep my medicine? This drug is given in a hospital or clinic   and will not be stored at home. NOTE: This sheet is a summary. It may not cover all possible information. If you have questions about this medicine, talk to your doctor, pharmacist, or health care provider.  2020 Elsevier/Gold Standard (2017-10-05 11:10:55)  

## 2019-09-05 ENCOUNTER — Other Ambulatory Visit: Payer: Self-pay

## 2019-09-05 ENCOUNTER — Inpatient Hospital Stay (HOSPITAL_BASED_OUTPATIENT_CLINIC_OR_DEPARTMENT_OTHER): Payer: 59 | Admitting: Family

## 2019-09-05 ENCOUNTER — Inpatient Hospital Stay: Payer: 59

## 2019-09-05 ENCOUNTER — Encounter: Payer: Self-pay | Admitting: Family

## 2019-09-05 VITALS — BP 150/68 | HR 80 | Temp 97.3°F | Resp 18 | Wt 201.0 lb

## 2019-09-05 DIAGNOSIS — D693 Immune thrombocytopenic purpura: Secondary | ICD-10-CM

## 2019-09-05 DIAGNOSIS — Z7901 Long term (current) use of anticoagulants: Secondary | ICD-10-CM | POA: Diagnosis not present

## 2019-09-05 DIAGNOSIS — D509 Iron deficiency anemia, unspecified: Secondary | ICD-10-CM

## 2019-09-05 DIAGNOSIS — I825Z1 Chronic embolism and thrombosis of unspecified deep veins of right distal lower extremity: Secondary | ICD-10-CM | POA: Diagnosis not present

## 2019-09-05 DIAGNOSIS — I2699 Other pulmonary embolism without acute cor pulmonale: Secondary | ICD-10-CM | POA: Diagnosis not present

## 2019-09-05 DIAGNOSIS — R5383 Other fatigue: Secondary | ICD-10-CM | POA: Diagnosis not present

## 2019-09-05 DIAGNOSIS — Z79899 Other long term (current) drug therapy: Secondary | ICD-10-CM | POA: Diagnosis not present

## 2019-09-05 LAB — CBC WITH DIFFERENTIAL (CANCER CENTER ONLY)
Abs Immature Granulocytes: 0.07 10*3/uL (ref 0.00–0.07)
Basophils Absolute: 0.2 10*3/uL — ABNORMAL HIGH (ref 0.0–0.1)
Basophils Relative: 2 %
Eosinophils Absolute: 1.9 10*3/uL — ABNORMAL HIGH (ref 0.0–0.5)
Eosinophils Relative: 13 %
HCT: 36.7 % (ref 36.0–46.0)
Hemoglobin: 12.2 g/dL (ref 12.0–15.0)
Immature Granulocytes: 1 %
Lymphocytes Relative: 26 %
Lymphs Abs: 3.8 10*3/uL (ref 0.7–4.0)
MCH: 31.3 pg (ref 26.0–34.0)
MCHC: 33.2 g/dL (ref 30.0–36.0)
MCV: 94.1 fL (ref 80.0–100.0)
Monocytes Absolute: 1.2 10*3/uL — ABNORMAL HIGH (ref 0.1–1.0)
Monocytes Relative: 8 %
Neutro Abs: 7.3 10*3/uL (ref 1.7–7.7)
Neutrophils Relative %: 50 %
Platelet Count: 272 10*3/uL (ref 150–400)
RBC: 3.9 MIL/uL (ref 3.87–5.11)
RDW: 13.5 % (ref 11.5–15.5)
WBC Count: 14.5 10*3/uL — ABNORMAL HIGH (ref 4.0–10.5)
nRBC: 0 % (ref 0.0–0.2)

## 2019-09-05 MED ORDER — ROMIPLOSTIM 250 MCG ~~LOC~~ SOLR
0.6700 ug/kg | Freq: Once | SUBCUTANEOUS | Status: AC
  Administered 2019-09-05: 60 ug via SUBCUTANEOUS
  Filled 2019-09-05: qty 0.12

## 2019-09-05 NOTE — Progress Notes (Signed)
Pltc = 272. Nplate dose today will be 60 mcg per Dr. Marin Olp. Dose changed per his instructions.

## 2019-09-05 NOTE — Patient Instructions (Signed)
Romiplostim injection What is this medicine? ROMIPLOSTIM (roe mi PLOE stim) helps your body make more platelets. This medicine is used to treat low platelets caused by chronic idiopathic thrombocytopenic purpura (ITP). This medicine may be used for other purposes; ask your health care provider or pharmacist if you have questions. COMMON BRAND NAME(S): Nplate What should I tell my health care provider before I take this medicine? They need to know if you have any of these conditions:  bleeding disorders  bone marrow problem, like blood cancer or myelodysplastic syndrome  history of blood clots  liver disease  surgery to remove your spleen  an unusual or allergic reaction to romiplostim, mannitol, other medicines, foods, dyes, or preservatives  pregnant or trying to get pregnant  breast-feeding How should I use this medicine? This medicine is for injection under the skin. It is given by a health care professional in a hospital or clinic setting. A special MedGuide will be given to you before your injection. Read this information carefully each time. Talk to your pediatrician regarding the use of this medicine in children. While this drug may be prescribed for children as young as 1 year for selected conditions, precautions do apply. Overdosage: If you think you have taken too much of this medicine contact a poison control center or emergency room at once. NOTE: This medicine is only for you. Do not share this medicine with others. What if I miss a dose? It is important not to miss your dose. Call your doctor or health care professional if you are unable to keep an appointment. What may interact with this medicine? Interactions are not expected. This list may not describe all possible interactions. Give your health care provider a list of all the medicines, herbs, non-prescription drugs, or dietary supplements you use. Also tell them if you smoke, drink alcohol, or use illegal drugs.  Some items may interact with your medicine. What should I watch for while using this medicine? Your condition will be monitored carefully while you are receiving this medicine. Visit your prescriber or health care professional for regular checks on your progress and for the needed blood tests. It is important to keep all appointments. What side effects may I notice from receiving this medicine? Side effects that you should report to your doctor or health care professional as soon as possible:  allergic reactions like skin rash, itching or hives, swelling of the face, lips, or tongue  signs and symptoms of bleeding such as bloody or black, tarry stools; red or dark brown urine; spitting up blood or brown material that looks like coffee grounds; red spots on the skin; unusual bruising or bleeding from the eyes, gums, or nose  signs and symptoms of a blood clot such as chest pain; shortness of breath; pain, swelling, or warmth in the leg  signs and symptoms of a stroke like changes in vision; confusion; trouble speaking or understanding; severe headaches; sudden numbness or weakness of the face, arm or leg; trouble walking; dizziness; loss of balance or coordination Side effects that usually do not require medical attention (report to your doctor or health care professional if they continue or are bothersome):  headache  pain in arms and legs  pain in mouth  stomach pain This list may not describe all possible side effects. Call your doctor for medical advice about side effects. You may report side effects to FDA at 1-800-FDA-1088. Where should I keep my medicine? This drug is given in a hospital or clinic   and will not be stored at home. NOTE: This sheet is a summary. It may not cover all possible information. If you have questions about this medicine, talk to your doctor, pharmacist, or health care provider.  2020 Elsevier/Gold Standard (2017-10-05 11:10:55)  

## 2019-09-05 NOTE — Progress Notes (Signed)
Hematology and Oncology Follow Up Visit  Janice Martin [id number] [birth year] 58 y.o. 09/05/2019   Principle Diagnosis:  Chronic immune thrombocytopenia - recurrent Submassive pulmonary embolism, with right lower extremity thrombus  Current Therapy:   Xarelto 10 mg by mouth daily Decadron taper per protocol for relapsed thrombocytopenia Nplate q weekly for platelet count < 300K   Interim History:  Janice Martin is here today for follow-up and Nplate injection. She is doing well does have some fatigue.  She is tolerating maintenance Xarelto. No episodes of bleeding. No bruising or petechiae.  No fever, chills, n/v, cough, rash, dizziness, SOB, chest pain, palpitations, abdominal pain or changes in bowel or bladder habits.  No swelling, tenderness, numbness or tingling in her extremities.  No falls or syncope.  She has maintained a good appetite and is staying well hydrated. Her weight is stable.   ECOG Performance Status: 1 - Symptomatic but completely ambulatory  Medications:  Allergies as of 09/05/2019      Reactions   Bee Venom Anaphylaxis, Shortness Of Breath, Swelling   Influenza Vaccines Other (See Comments)   Per pt can't take mess up with her immune system   Morphine Other (See Comments)   Burns really bad   Shellfish Allergy Anaphylaxis      Medication List       Accurate as of September 05, 2019  2:38 PM. If you have any questions, ask your nurse or doctor.        nebivolol 5 MG tablet Commonly known as: Bystolic Take 1 tablet (5 mg total) by mouth daily.   valsartan-hydrochlorothiazide 320-25 MG tablet Commonly known as: DIOVAN-HCT Take 1 tablet by mouth daily.   vitamin B-12 100 MCG tablet Commonly known as: CYANOCOBALAMIN Take 100 mcg by mouth at bedtime.   Xarelto 10 MG Tabs tablet Generic drug: rivaroxaban TAKE 1 TABLET BY MOUTH ONCE DAILY WITH SUPPER       Allergies:  Allergies  Allergen Reactions  . Bee Venom Anaphylaxis, Shortness Of  Breath and Swelling  . Influenza Vaccines Other (See Comments)    Per pt can't take mess up with her immune system  . Morphine Other (See Comments)    Burns really bad  . Shellfish Allergy Anaphylaxis    Past Medical History, Surgical history, Social history, and Family History were reviewed and updated.  Review of Systems: All other 10 point review of systems is negative.   Physical Exam:  vitals were not taken for this visit.   Wt Readings from Last 3 Encounters:  08/17/19 197 lb 6.4 oz (89.5 kg)  07/06/19 205 lb (93 kg)  05/25/19 199 lb 1.9 oz (90.3 kg)    Ocular: Sclerae unicteric, pupils equal, round and reactive to light Ear-nose-throat: Oropharynx clear, dentition fair Lymphatic: No cervical or supraclavicular adenopathy Lungs no rales or rhonchi, good excursion bilaterally Heart regular rate and rhythm, no murmur appreciated Abd soft, nontender, positive bowel sounds, no liver or spleen tip palpated on exam, no fluid wave  MSK no focal spinal tenderness, no joint edema Neuro: non-focal, well-oriented, appropriate affect Breasts: Deferred   Lab Results  Component Value Date   WBC 14.5 (H) 09/05/2019   HGB 12.2 09/05/2019   HCT 36.7 09/05/2019   MCV 94.1 09/05/2019   PLT 272 09/05/2019   Lab Results  Component Value Date   FERRITIN 138 02/09/2015   IRON 71 02/09/2015   TIBC 238 02/09/2015   UIBC 166 02/09/2015   IRONPCTSAT 30 02/09/2015   Lab  Results  Component Value Date   RETICCTPCT 2.35 (H) 02/09/2015   RBC 3.90 09/05/2019   RETICCTABS 95.88 (H) 02/09/2015   No results found for: KPAFRELGTCHN, LAMBDASER, KAPLAMBRATIO No results found for: Kandis Cocking, [facility] Lab Results  Component Value Date   TOTALPROTELP 7.0 06/28/2010   ALBUMINELP 56.3 06/28/2010   A1GS 8.7 (H) 06/28/2010   A2GS 7.9 06/28/2010   BETS 7.5 (H) 06/28/2010   BETA2SER 4.7 06/28/2010   GAMS 14.9 06/28/2010   MSPIKE NOT DET 06/28/2010   SPEI * 06/28/2010     Chemistry       Component Value Date/Time   NA 140 08/24/2019 0732   NA 140 09/17/2017 1507   K 3.9 08/24/2019 0732   K 3.9 09/17/2017 1507   CL 100 08/24/2019 0732   CL 99 04/17/2017 1411   CO2 28 08/24/2019 0732   CO2 30 (H) 09/17/2017 1507   BUN 10 08/24/2019 0732   BUN 8.9 09/17/2017 1507   CREATININE 1.12 (H) 08/24/2019 0732   CREATININE 1.2 (H) 09/17/2017 1507      Component Value Date/Time   CALCIUM 9.3 08/24/2019 0732   CALCIUM 9.3 09/17/2017 1507   ALKPHOS 104 08/24/2019 0732   ALKPHOS 105 09/17/2017 1507   AST 19 08/24/2019 0732   AST 15 09/17/2017 1507   ALT 23 08/24/2019 0732   ALT 18 09/17/2017 1507   BILITOT 0.3 08/24/2019 0732   BILITOT 0.38 09/17/2017 1507       Impression and Plan: Janice Martin is a very pleasant 58 yo caucasian female with history of relapsed chronic ITP.  Her platelet count is stable at 272. I spoke with Dr. Marin Olp and Lattie Haw in pharmacy and we will reduce her Nplate dose to 624THL mcg/kg.  We will continue her weekly injections and we will see her back in a month for follow-up.  She will contact our office with any questions or concerns. We can certainly see her sooner if needed.   Laverna Peace, NP 11/16/20202:38 PM

## 2019-09-07 ENCOUNTER — Ambulatory Visit: Payer: 59

## 2019-09-07 ENCOUNTER — Ambulatory Visit: Payer: 59 | Admitting: Hematology & Oncology

## 2019-09-07 ENCOUNTER — Other Ambulatory Visit: Payer: 59

## 2019-09-08 ENCOUNTER — Telehealth: Payer: Self-pay | Admitting: Hematology & Oncology

## 2019-09-08 NOTE — Telephone Encounter (Signed)
Scheduled appt per 11/19 staff message from [place] scheduler . Pt is aware of appts

## 2019-09-09 DIAGNOSIS — Z760 Encounter for issue of repeat prescription: Secondary | ICD-10-CM | POA: Diagnosis not present

## 2019-09-13 ENCOUNTER — Ambulatory Visit: Payer: 59 | Admitting: Hematology & Oncology

## 2019-09-13 ENCOUNTER — Other Ambulatory Visit: Payer: 59

## 2019-09-13 ENCOUNTER — Ambulatory Visit: Payer: 59

## 2019-09-14 ENCOUNTER — Inpatient Hospital Stay: Payer: 59

## 2019-09-14 ENCOUNTER — Other Ambulatory Visit: Payer: Self-pay

## 2019-09-14 VITALS — BP 150/81 | HR 90 | Temp 98.2°F

## 2019-09-14 DIAGNOSIS — D693 Immune thrombocytopenic purpura: Secondary | ICD-10-CM | POA: Diagnosis not present

## 2019-09-14 DIAGNOSIS — Z7901 Long term (current) use of anticoagulants: Secondary | ICD-10-CM | POA: Diagnosis not present

## 2019-09-14 DIAGNOSIS — Z79899 Other long term (current) drug therapy: Secondary | ICD-10-CM | POA: Diagnosis not present

## 2019-09-14 DIAGNOSIS — R5383 Other fatigue: Secondary | ICD-10-CM | POA: Diagnosis not present

## 2019-09-14 DIAGNOSIS — I2699 Other pulmonary embolism without acute cor pulmonale: Secondary | ICD-10-CM | POA: Diagnosis not present

## 2019-09-14 LAB — CBC WITH DIFFERENTIAL (CANCER CENTER ONLY)
Abs Immature Granulocytes: 0.05 10*3/uL (ref 0.00–0.07)
Basophils Absolute: 0.2 10*3/uL — ABNORMAL HIGH (ref 0.0–0.1)
Basophils Relative: 2 %
Eosinophils Absolute: 0.2 10*3/uL (ref 0.0–0.5)
Eosinophils Relative: 2 %
HCT: 37.8 % (ref 36.0–46.0)
Hemoglobin: 12.7 g/dL (ref 12.0–15.0)
Immature Granulocytes: 0 %
Lymphocytes Relative: 27 %
Lymphs Abs: 3.3 10*3/uL (ref 0.7–4.0)
MCH: 31.4 pg (ref 26.0–34.0)
MCHC: 33.6 g/dL (ref 30.0–36.0)
MCV: 93.3 fL (ref 80.0–100.0)
Monocytes Absolute: 1.2 10*3/uL — ABNORMAL HIGH (ref 0.1–1.0)
Monocytes Relative: 10 %
Neutro Abs: 7.2 10*3/uL (ref 1.7–7.7)
Neutrophils Relative %: 59 %
Platelet Count: 96 10*3/uL — ABNORMAL LOW (ref 150–400)
RBC: 4.05 MIL/uL (ref 3.87–5.11)
RDW: 13.4 % (ref 11.5–15.5)
WBC Count: 12.1 10*3/uL — ABNORMAL HIGH (ref 4.0–10.5)
nRBC: 0 % (ref 0.0–0.2)

## 2019-09-14 MED ORDER — ROMIPLOSTIM 250 MCG ~~LOC~~ SOLR
1.0000 ug/kg | Freq: Once | SUBCUTANEOUS | Status: AC
  Administered 2019-09-14: 90 ug via SUBCUTANEOUS
  Filled 2019-09-14: qty 0.18

## 2019-09-14 NOTE — Patient Instructions (Signed)
Romiplostim injection What is this medicine? ROMIPLOSTIM (roe mi PLOE stim) helps your body make more platelets. This medicine is used to treat low platelets caused by chronic idiopathic thrombocytopenic purpura (ITP). This medicine may be used for other purposes; ask your health care provider or pharmacist if you have questions. COMMON BRAND NAME(S): Nplate What should I tell my health care provider before I take this medicine? They need to know if you have any of these conditions:  bleeding disorders  bone marrow problem, like blood cancer or myelodysplastic syndrome  history of blood clots  liver disease  surgery to remove your spleen  an unusual or allergic reaction to romiplostim, mannitol, other medicines, foods, dyes, or preservatives  pregnant or trying to get pregnant  breast-feeding How should I use this medicine? This medicine is for injection under the skin. It is given by a health care professional in a hospital or clinic setting. A special MedGuide will be given to you before your injection. Read this information carefully each time. Talk to your pediatrician regarding the use of this medicine in children. While this drug may be prescribed for children as young as 1 year for selected conditions, precautions do apply. Overdosage: If you think you have taken too much of this medicine contact a poison control center or emergency room at once. NOTE: This medicine is only for you. Do not share this medicine with others. What if I miss a dose? It is important not to miss your dose. Call your doctor or health care professional if you are unable to keep an appointment. What may interact with this medicine? Interactions are not expected. This list may not describe all possible interactions. Give your health care provider a list of all the medicines, herbs, non-prescription drugs, or dietary supplements you use. Also tell them if you smoke, drink alcohol, or use illegal drugs.  Some items may interact with your medicine. What should I watch for while using this medicine? Your condition will be monitored carefully while you are receiving this medicine. Visit your prescriber or health care professional for regular checks on your progress and for the needed blood tests. It is important to keep all appointments. What side effects may I notice from receiving this medicine? Side effects that you should report to your doctor or health care professional as soon as possible:  allergic reactions like skin rash, itching or hives, swelling of the face, lips, or tongue  signs and symptoms of bleeding such as bloody or black, tarry stools; red or dark brown urine; spitting up blood or brown material that looks like coffee grounds; red spots on the skin; unusual bruising or bleeding from the eyes, gums, or nose  signs and symptoms of a blood clot such as chest pain; shortness of breath; pain, swelling, or warmth in the leg  signs and symptoms of a stroke like changes in vision; confusion; trouble speaking or understanding; severe headaches; sudden numbness or weakness of the face, arm or leg; trouble walking; dizziness; loss of balance or coordination Side effects that usually do not require medical attention (report to your doctor or health care professional if they continue or are bothersome):  headache  pain in arms and legs  pain in mouth  stomach pain This list may not describe all possible side effects. Call your doctor for medical advice about side effects. You may report side effects to FDA at 1-800-FDA-1088. Where should I keep my medicine? This drug is given in a hospital or clinic   and will not be stored at home. NOTE: This sheet is a summary. It may not cover all possible information. If you have questions about this medicine, talk to your doctor, pharmacist, or health care provider.  2020 Elsevier/Gold Standard (2017-10-05 11:10:55)  

## 2019-09-14 NOTE — Progress Notes (Signed)
09/14/19  Increase dose of NPlate to 90 mcg do to drop in platelets of 96K   Order modified to reflect above.  T.O. Dr Rebeca Alert, PharmD

## 2019-09-20 ENCOUNTER — Other Ambulatory Visit: Payer: Self-pay | Admitting: *Deleted

## 2019-09-20 DIAGNOSIS — D693 Immune thrombocytopenic purpura: Secondary | ICD-10-CM

## 2019-09-21 ENCOUNTER — Inpatient Hospital Stay: Payer: 59 | Attending: Hematology & Oncology

## 2019-09-21 ENCOUNTER — Other Ambulatory Visit: Payer: Self-pay

## 2019-09-21 ENCOUNTER — Inpatient Hospital Stay: Payer: 59

## 2019-09-21 VITALS — BP 148/72 | HR 78 | Temp 98.2°F | Resp 18

## 2019-09-21 DIAGNOSIS — Z7901 Long term (current) use of anticoagulants: Secondary | ICD-10-CM | POA: Insufficient documentation

## 2019-09-21 DIAGNOSIS — I2699 Other pulmonary embolism without acute cor pulmonale: Secondary | ICD-10-CM | POA: Diagnosis not present

## 2019-09-21 DIAGNOSIS — D693 Immune thrombocytopenic purpura: Secondary | ICD-10-CM | POA: Diagnosis not present

## 2019-09-21 LAB — CBC WITH DIFFERENTIAL (CANCER CENTER ONLY)
Abs Immature Granulocytes: 0.09 10*3/uL — ABNORMAL HIGH (ref 0.00–0.07)
Basophils Absolute: 0.2 10*3/uL — ABNORMAL HIGH (ref 0.0–0.1)
Basophils Relative: 1 %
Eosinophils Absolute: 0.2 10*3/uL (ref 0.0–0.5)
Eosinophils Relative: 1 %
HCT: 39.3 % (ref 36.0–46.0)
Hemoglobin: 13.2 g/dL (ref 12.0–15.0)
Immature Granulocytes: 1 %
Lymphocytes Relative: 29 %
Lymphs Abs: 3.7 10*3/uL (ref 0.7–4.0)
MCH: 30.9 pg (ref 26.0–34.0)
MCHC: 33.6 g/dL (ref 30.0–36.0)
MCV: 92 fL (ref 80.0–100.0)
Monocytes Absolute: 1.2 10*3/uL — ABNORMAL HIGH (ref 0.1–1.0)
Monocytes Relative: 9 %
Neutro Abs: 7.6 10*3/uL (ref 1.7–7.7)
Neutrophils Relative %: 59 %
Platelet Count: 121 10*3/uL — ABNORMAL LOW (ref 150–400)
RBC: 4.27 MIL/uL (ref 3.87–5.11)
RDW: 13.5 % (ref 11.5–15.5)
WBC Count: 12.8 10*3/uL — ABNORMAL HIGH (ref 4.0–10.5)
nRBC: 0 % (ref 0.0–0.2)

## 2019-09-21 MED ORDER — ROMIPLOSTIM 250 MCG ~~LOC~~ SOLR
90.0000 ug | Freq: Once | SUBCUTANEOUS | Status: AC
  Administered 2019-09-21: 09:00:00 90 ug via SUBCUTANEOUS
  Filled 2019-09-21: qty 0.18

## 2019-09-21 NOTE — Patient Instructions (Signed)
Romiplostim injection What is this medicine? ROMIPLOSTIM (roe mi PLOE stim) helps your body make more platelets. This medicine is used to treat low platelets caused by chronic idiopathic thrombocytopenic purpura (ITP). This medicine may be used for other purposes; ask your health care provider or pharmacist if you have questions. COMMON BRAND NAME(S): Nplate What should I tell my health care provider before I take this medicine? They need to know if you have any of these conditions:  bleeding disorders  bone marrow problem, like blood cancer or myelodysplastic syndrome  history of blood clots  liver disease  surgery to remove your spleen  an unusual or allergic reaction to romiplostim, mannitol, other medicines, foods, dyes, or preservatives  pregnant or trying to get pregnant  breast-feeding How should I use this medicine? This medicine is for injection under the skin. It is given by a health care professional in a hospital or clinic setting. A special MedGuide will be given to you before your injection. Read this information carefully each time. Talk to your pediatrician regarding the use of this medicine in children. While this drug may be prescribed for children as young as 1 year for selected conditions, precautions do apply. Overdosage: If you think you have taken too much of this medicine contact a poison control center or emergency room at once. NOTE: This medicine is only for you. Do not share this medicine with others. What if I miss a dose? It is important not to miss your dose. Call your doctor or health care professional if you are unable to keep an appointment. What may interact with this medicine? Interactions are not expected. This list may not describe all possible interactions. Give your health care provider a list of all the medicines, herbs, non-prescription drugs, or dietary supplements you use. Also tell them if you smoke, drink alcohol, or use illegal drugs.  Some items may interact with your medicine. What should I watch for while using this medicine? Your condition will be monitored carefully while you are receiving this medicine. Visit your prescriber or health care professional for regular checks on your progress and for the needed blood tests. It is important to keep all appointments. What side effects may I notice from receiving this medicine? Side effects that you should report to your doctor or health care professional as soon as possible:  allergic reactions like skin rash, itching or hives, swelling of the face, lips, or tongue  signs and symptoms of bleeding such as bloody or black, tarry stools; red or dark brown urine; spitting up blood or brown material that looks like coffee grounds; red spots on the skin; unusual bruising or bleeding from the eyes, gums, or nose  signs and symptoms of a blood clot such as chest pain; shortness of breath; pain, swelling, or warmth in the leg  signs and symptoms of a stroke like changes in vision; confusion; trouble speaking or understanding; severe headaches; sudden numbness or weakness of the face, arm or leg; trouble walking; dizziness; loss of balance or coordination Side effects that usually do not require medical attention (report to your doctor or health care professional if they continue or are bothersome):  headache  pain in arms and legs  pain in mouth  stomach pain This list may not describe all possible side effects. Call your doctor for medical advice about side effects. You may report side effects to FDA at 1-800-FDA-1088. Where should I keep my medicine? This drug is given in a hospital or clinic   and will not be stored at home. NOTE: This sheet is a summary. It may not cover all possible information. If you have questions about this medicine, talk to your doctor, pharmacist, or health care provider.  2020 Elsevier/Gold Standard (2017-10-05 11:10:55)  

## 2019-09-27 ENCOUNTER — Other Ambulatory Visit: Payer: Self-pay | Admitting: *Deleted

## 2019-09-27 DIAGNOSIS — D693 Immune thrombocytopenic purpura: Secondary | ICD-10-CM

## 2019-09-28 ENCOUNTER — Inpatient Hospital Stay: Payer: 59

## 2019-09-28 ENCOUNTER — Other Ambulatory Visit: Payer: Self-pay

## 2019-09-28 VITALS — BP 144/72 | HR 72 | Temp 98.2°F | Resp 18

## 2019-09-28 DIAGNOSIS — Z7901 Long term (current) use of anticoagulants: Secondary | ICD-10-CM | POA: Diagnosis not present

## 2019-09-28 DIAGNOSIS — D693 Immune thrombocytopenic purpura: Secondary | ICD-10-CM

## 2019-09-28 DIAGNOSIS — I2699 Other pulmonary embolism without acute cor pulmonale: Secondary | ICD-10-CM | POA: Diagnosis not present

## 2019-09-28 LAB — CBC WITH DIFFERENTIAL (CANCER CENTER ONLY)
Abs Immature Granulocytes: 0.08 10*3/uL — ABNORMAL HIGH (ref 0.00–0.07)
Basophils Absolute: 0.2 10*3/uL — ABNORMAL HIGH (ref 0.0–0.1)
Basophils Relative: 1 %
Eosinophils Absolute: 0.1 10*3/uL (ref 0.0–0.5)
Eosinophils Relative: 1 %
HCT: 39 % (ref 36.0–46.0)
Hemoglobin: 13 g/dL (ref 12.0–15.0)
Immature Granulocytes: 1 %
Lymphocytes Relative: 24 %
Lymphs Abs: 3.5 10*3/uL (ref 0.7–4.0)
MCH: 31.3 pg (ref 26.0–34.0)
MCHC: 33.3 g/dL (ref 30.0–36.0)
MCV: 94 fL (ref 80.0–100.0)
Monocytes Absolute: 1 10*3/uL (ref 0.1–1.0)
Monocytes Relative: 7 %
Neutro Abs: 9.7 10*3/uL — ABNORMAL HIGH (ref 1.7–7.7)
Neutrophils Relative %: 66 %
Platelet Count: 233 10*3/uL (ref 150–400)
RBC: 4.15 MIL/uL (ref 3.87–5.11)
RDW: 13.7 % (ref 11.5–15.5)
WBC Count: 14.6 10*3/uL — ABNORMAL HIGH (ref 4.0–10.5)
nRBC: 0 % (ref 0.0–0.2)

## 2019-09-28 MED ORDER — ROMIPLOSTIM 250 MCG ~~LOC~~ SOLR
90.0000 ug | Freq: Once | SUBCUTANEOUS | Status: AC
  Administered 2019-09-28: 90 ug via SUBCUTANEOUS
  Filled 2019-09-28: qty 0.18

## 2019-09-28 NOTE — Patient Instructions (Signed)
Romiplostim injection What is this medicine? ROMIPLOSTIM (roe mi PLOE stim) helps your body make more platelets. This medicine is used to treat low platelets caused by chronic idiopathic thrombocytopenic purpura (ITP). This medicine may be used for other purposes; ask your health care provider or pharmacist if you have questions. COMMON BRAND NAME(S): Nplate What should I tell my health care provider before I take this medicine? They need to know if you have any of these conditions:  bleeding disorders  bone marrow problem, like blood cancer or myelodysplastic syndrome  history of blood clots  liver disease  surgery to remove your spleen  an unusual or allergic reaction to romiplostim, mannitol, other medicines, foods, dyes, or preservatives  pregnant or trying to get pregnant  breast-feeding How should I use this medicine? This medicine is for injection under the skin. It is given by a health care professional in a hospital or clinic setting. A special MedGuide will be given to you before your injection. Read this information carefully each time. Talk to your pediatrician regarding the use of this medicine in children. While this drug may be prescribed for children as young as 1 year for selected conditions, precautions do apply. Overdosage: If you think you have taken too much of this medicine contact a poison control center or emergency room at once. NOTE: This medicine is only for you. Do not share this medicine with others. What if I miss a dose? It is important not to miss your dose. Call your doctor or health care professional if you are unable to keep an appointment. What may interact with this medicine? Interactions are not expected. This list may not describe all possible interactions. Give your health care provider a list of all the medicines, herbs, non-prescription drugs, or dietary supplements you use. Also tell them if you smoke, drink alcohol, or use illegal drugs.  Some items may interact with your medicine. What should I watch for while using this medicine? Your condition will be monitored carefully while you are receiving this medicine. Visit your prescriber or health care professional for regular checks on your progress and for the needed blood tests. It is important to keep all appointments. What side effects may I notice from receiving this medicine? Side effects that you should report to your doctor or health care professional as soon as possible:  allergic reactions like skin rash, itching or hives, swelling of the face, lips, or tongue  signs and symptoms of bleeding such as bloody or black, tarry stools; red or dark brown urine; spitting up blood or brown material that looks like coffee grounds; red spots on the skin; unusual bruising or bleeding from the eyes, gums, or nose  signs and symptoms of a blood clot such as chest pain; shortness of breath; pain, swelling, or warmth in the leg  signs and symptoms of a stroke like changes in vision; confusion; trouble speaking or understanding; severe headaches; sudden numbness or weakness of the face, arm or leg; trouble walking; dizziness; loss of balance or coordination Side effects that usually do not require medical attention (report to your doctor or health care professional if they continue or are bothersome):  headache  pain in arms and legs  pain in mouth  stomach pain This list may not describe all possible side effects. Call your doctor for medical advice about side effects. You may report side effects to FDA at 1-800-FDA-1088. Where should I keep my medicine? This drug is given in a hospital or clinic   and will not be stored at home. NOTE: This sheet is a summary. It may not cover all possible information. If you have questions about this medicine, talk to your doctor, pharmacist, or health care provider.  2020 Elsevier/Gold Standard (2017-10-05 11:10:55)  

## 2019-10-04 MED FILL — BYSTOLIC 5 MG TABLET: 5 | 90 days supply | Qty: 90 | Fill #1

## 2019-10-04 MED FILL — VALSARTAN-HCTZ 320-25 MG TA: 320-25 | 30 days supply | Qty: 30 | Fill #3

## 2019-10-05 ENCOUNTER — Other Ambulatory Visit: Payer: Self-pay

## 2019-10-05 ENCOUNTER — Inpatient Hospital Stay: Payer: 59

## 2019-10-05 VITALS — BP 148/70 | HR 78 | Temp 98.7°F | Resp 18

## 2019-10-05 DIAGNOSIS — D693 Immune thrombocytopenic purpura: Secondary | ICD-10-CM

## 2019-10-05 DIAGNOSIS — D509 Iron deficiency anemia, unspecified: Secondary | ICD-10-CM

## 2019-10-05 DIAGNOSIS — I825Z1 Chronic embolism and thrombosis of unspecified deep veins of right distal lower extremity: Secondary | ICD-10-CM

## 2019-10-05 DIAGNOSIS — Z7901 Long term (current) use of anticoagulants: Secondary | ICD-10-CM | POA: Diagnosis not present

## 2019-10-05 DIAGNOSIS — I2699 Other pulmonary embolism without acute cor pulmonale: Secondary | ICD-10-CM | POA: Diagnosis not present

## 2019-10-05 LAB — CBC WITH DIFFERENTIAL (CANCER CENTER ONLY)
Abs Immature Granulocytes: 0.04 10*3/uL (ref 0.00–0.07)
Basophils Absolute: 0.2 10*3/uL — ABNORMAL HIGH (ref 0.0–0.1)
Basophils Relative: 2 %
Eosinophils Absolute: 0.1 10*3/uL (ref 0.0–0.5)
Eosinophils Relative: 1 %
HCT: 38.4 % (ref 36.0–46.0)
Hemoglobin: 13 g/dL (ref 12.0–15.0)
Immature Granulocytes: 0 %
Lymphocytes Relative: 19 %
Lymphs Abs: 2.5 10*3/uL (ref 0.7–4.0)
MCH: 31.3 pg (ref 26.0–34.0)
MCHC: 33.9 g/dL (ref 30.0–36.0)
MCV: 92.5 fL (ref 80.0–100.0)
Monocytes Absolute: 1.4 10*3/uL — ABNORMAL HIGH (ref 0.1–1.0)
Monocytes Relative: 10 %
Neutro Abs: 9.2 10*3/uL — ABNORMAL HIGH (ref 1.7–7.7)
Neutrophils Relative %: 68 %
Platelet Count: 363 10*3/uL (ref 150–400)
RBC: 4.15 MIL/uL (ref 3.87–5.11)
RDW: 13.5 % (ref 11.5–15.5)
WBC Count: 13.5 10*3/uL — ABNORMAL HIGH (ref 4.0–10.5)
nRBC: 0 % (ref 0.0–0.2)

## 2019-10-05 LAB — IRON AND TIBC
Iron: 71 ug/dL (ref 41–142)
Saturation Ratios: 27 % (ref 21–57)
TIBC: 266 ug/dL (ref 236–444)
UIBC: 196 ug/dL (ref 120–384)

## 2019-10-05 LAB — FERRITIN: Ferritin: 101 ng/mL (ref 11–307)

## 2019-10-05 MED ORDER — ROMIPLOSTIM 250 MCG ~~LOC~~ SOLR: 1.0000 ug/kg | Freq: Once | SUBCUTANEOUS | Status: DC

## 2019-10-05 NOTE — Progress Notes (Signed)
No nplate today per dr Marin Olp

## 2019-10-10 ENCOUNTER — Other Ambulatory Visit: Payer: 59

## 2019-10-10 ENCOUNTER — Ambulatory Visit: Payer: 59

## 2019-10-10 ENCOUNTER — Ambulatory Visit: Payer: 59 | Admitting: Hematology & Oncology

## 2019-10-11 ENCOUNTER — Other Ambulatory Visit: Payer: Self-pay | Admitting: Family

## 2019-10-11 DIAGNOSIS — D693 Immune thrombocytopenic purpura: Secondary | ICD-10-CM

## 2019-10-12 ENCOUNTER — Inpatient Hospital Stay: Payer: 59

## 2019-10-12 ENCOUNTER — Encounter: Payer: Self-pay | Admitting: Family

## 2019-10-12 ENCOUNTER — Inpatient Hospital Stay (HOSPITAL_BASED_OUTPATIENT_CLINIC_OR_DEPARTMENT_OTHER): Payer: 59 | Admitting: Family

## 2019-10-12 ENCOUNTER — Other Ambulatory Visit: Payer: Self-pay

## 2019-10-12 VITALS — BP 171/89 | HR 88 | Temp 97.4°F | Resp 18 | Ht 64.0 in | Wt 193.0 lb

## 2019-10-12 DIAGNOSIS — Z7901 Long term (current) use of anticoagulants: Secondary | ICD-10-CM | POA: Diagnosis not present

## 2019-10-12 DIAGNOSIS — Z79899 Other long term (current) drug therapy: Secondary | ICD-10-CM

## 2019-10-12 DIAGNOSIS — D693 Immune thrombocytopenic purpura: Secondary | ICD-10-CM | POA: Diagnosis not present

## 2019-10-12 DIAGNOSIS — I2699 Other pulmonary embolism without acute cor pulmonale: Secondary | ICD-10-CM | POA: Diagnosis not present

## 2019-10-12 LAB — CBC WITH DIFFERENTIAL (CANCER CENTER ONLY)
Abs Immature Granulocytes: 0.03 K/uL (ref 0.00–0.07)
Basophils Absolute: 0.2 K/uL — ABNORMAL HIGH (ref 0.0–0.1)
Basophils Relative: 2 %
Eosinophils Absolute: 0 K/uL (ref 0.0–0.5)
Eosinophils Relative: 0 %
HCT: 38.2 % (ref 36.0–46.0)
Hemoglobin: 12.7 g/dL (ref 12.0–15.0)
Immature Granulocytes: 0 %
Lymphocytes Relative: 39 %
Lymphs Abs: 3.9 K/uL (ref 0.7–4.0)
MCH: 30.5 pg (ref 26.0–34.0)
MCHC: 33.2 g/dL (ref 30.0–36.0)
MCV: 91.6 fL (ref 80.0–100.0)
Monocytes Absolute: 0.8 K/uL (ref 0.1–1.0)
Monocytes Relative: 8 %
Neutro Abs: 5.1 K/uL (ref 1.7–7.7)
Neutrophils Relative %: 51 %
Platelet Count: 262 K/uL (ref 150–400)
RBC: 4.17 MIL/uL (ref 3.87–5.11)
RDW: 13.2 % (ref 11.5–15.5)
WBC Count: 10.1 K/uL (ref 4.0–10.5)
nRBC: 0 % (ref 0.0–0.2)

## 2019-10-12 MED ORDER — ROMIPLOSTIM 250 MCG ~~LOC~~ SOLR
0.5000 ug/kg | Freq: Once | SUBCUTANEOUS | Status: AC
  Administered 2019-10-12: 10:00:00 45 ug via SUBCUTANEOUS
  Filled 2019-10-12: qty 0.09

## 2019-10-12 NOTE — Patient Instructions (Signed)
Romiplostim injection What is this medicine? ROMIPLOSTIM (roe mi PLOE stim) helps your body make more platelets. This medicine is used to treat low platelets caused by chronic idiopathic thrombocytopenic purpura (ITP). This medicine may be used for other purposes; ask your health care provider or pharmacist if you have questions. COMMON BRAND NAME(S): Nplate What should I tell my health care provider before I take this medicine? They need to know if you have any of these conditions:  bleeding disorders  bone marrow problem, like blood cancer or myelodysplastic syndrome  history of blood clots  liver disease  surgery to remove your spleen  an unusual or allergic reaction to romiplostim, mannitol, other medicines, foods, dyes, or preservatives  pregnant or trying to get pregnant  breast-feeding How should I use this medicine? This medicine is for injection under the skin. It is given by a health care professional in a hospital or clinic setting. A special MedGuide will be given to you before your injection. Read this information carefully each time. Talk to your pediatrician regarding the use of this medicine in children. While this drug may be prescribed for children as young as 1 year for selected conditions, precautions do apply. Overdosage: If you think you have taken too much of this medicine contact a poison control center or emergency room at once. NOTE: This medicine is only for you. Do not share this medicine with others. What if I miss a dose? It is important not to miss your dose. Call your doctor or health care professional if you are unable to keep an appointment. What may interact with this medicine? Interactions are not expected. This list may not describe all possible interactions. Give your health care provider a list of all the medicines, herbs, non-prescription drugs, or dietary supplements you use. Also tell them if you smoke, drink alcohol, or use illegal drugs.  Some items may interact with your medicine. What should I watch for while using this medicine? Your condition will be monitored carefully while you are receiving this medicine. Visit your prescriber or health care professional for regular checks on your progress and for the needed blood tests. It is important to keep all appointments. What side effects may I notice from receiving this medicine? Side effects that you should report to your doctor or health care professional as soon as possible:  allergic reactions like skin rash, itching or hives, swelling of the face, lips, or tongue  signs and symptoms of bleeding such as bloody or black, tarry stools; red or dark brown urine; spitting up blood or brown material that looks like coffee grounds; red spots on the skin; unusual bruising or bleeding from the eyes, gums, or nose  signs and symptoms of a blood clot such as chest pain; shortness of breath; pain, swelling, or warmth in the leg  signs and symptoms of a stroke like changes in vision; confusion; trouble speaking or understanding; severe headaches; sudden numbness or weakness of the face, arm or leg; trouble walking; dizziness; loss of balance or coordination Side effects that usually do not require medical attention (report to your doctor or health care professional if they continue or are bothersome):  headache  pain in arms and legs  pain in mouth  stomach pain This list may not describe all possible side effects. Call your doctor for medical advice about side effects. You may report side effects to FDA at 1-800-FDA-1088. Where should I keep my medicine? This drug is given in a hospital or clinic   and will not be stored at home. NOTE: This sheet is a summary. It may not cover all possible information. If you have questions about this medicine, talk to your doctor, pharmacist, or health care provider.  2020 Elsevier/Gold Standard (2017-10-05 11:10:55)  

## 2019-10-12 NOTE — Progress Notes (Signed)
Hematology and Oncology Follow Up Visit  Janice Martin [id number] 05/24/1961 58 y.o. 10/12/2019   Principle Diagnosis:  Chronic immune thrombocytopenia - recurrent Submassive pulmonary embolism, with right lower extremity thrombus  Current Therapy:   Xarelto 10 mg by mouth daily Decadron taper per protocol for relapsed thrombocytopenia Nplate q weekly for platelet count < 300K   Interim History:  Janice Martin had a telephone visit today for follow-up. She is doing well and has no complaints at this time.  Platelet count is stable at 262.  No issues with bleeding. No bruising or petechiae.  No fever, chills, n/v, cough, rash, dizziness, SOB, chest pain, palpitations, abdominal pain or changes in bowel or bladder habits.  No swelling, tenderness, numbness or tingling in her extremities.  She is resting well at night.  No falls or syncopal episodes.  She has a good appetite and is hydrating appropriately. Her weight is stable.   ECOG Performance Status: 0 - Asymptomatic  Medications:  Allergies as of 10/12/2019      Reactions   Bee Venom Anaphylaxis, Shortness Of Breath, Swelling   Influenza Vaccines Other (See Comments)   Per pt can't take mess up with her immune system   Morphine Other (See Comments)   Burns really bad   Shellfish Allergy Anaphylaxis      Medication List       Accurate as of October 12, 2019  8:51 AM. If you have any questions, ask your nurse or doctor.        nebivolol 5 MG tablet Commonly known as: Bystolic Take 1 tablet (5 mg total) by mouth daily.   valsartan-hydrochlorothiazide 320-25 MG tablet Commonly known as: DIOVAN-HCT Take 1 tablet by mouth daily.   vitamin B-12 100 MCG tablet Commonly known as: CYANOCOBALAMIN Take 100 mcg by mouth at bedtime.   Xarelto 10 MG Tabs tablet Generic drug: rivaroxaban TAKE 1 TABLET BY MOUTH ONCE DAILY WITH SUPPER       Allergies:  Allergies  Allergen Reactions  . Bee Venom Anaphylaxis, Shortness  Of Breath and Swelling  . Influenza Vaccines Other (See Comments)    Per pt can't take mess up with her immune system  . Morphine Other (See Comments)    Burns really bad  . Shellfish Allergy Anaphylaxis    Past Medical History, Surgical history, Social history, and Family History were reviewed and updated.  Review of Systems: All other 10 point review of systems is negative.   Physical Exam:  height is 5\' 4"  (1.626 m) and weight is 193 lb 0.6 oz (87.6 kg). Her temporal temperature is 97.4 F (36.3 C) (abnormal). Her blood pressure is 171/89 (abnormal) and her pulse is 88. Her respiration is 18 and oxygen saturation is 100%.   Wt Readings from Last 3 Encounters:  10/12/19 193 lb 0.6 oz (87.6 kg)  09/05/19 201 lb (91.2 kg)  08/17/19 197 lb 6.4 oz (89.5 kg)     Lab Results  Component Value Date   WBC 10.1 10/12/2019   HGB 12.7 10/12/2019   HCT 38.2 10/12/2019   MCV 91.6 10/12/2019   PLT 262 10/12/2019   Lab Results  Component Value Date   FERRITIN 101 10/05/2019   IRON 71 10/05/2019   TIBC 266 10/05/2019   UIBC 196 10/05/2019   IRONPCTSAT 27 10/05/2019   Lab Results  Component Value Date   RETICCTPCT 2.35 (H) 02/09/2015   RBC 4.17 10/12/2019   RETICCTABS 95.88 (H) 02/09/2015   No results found for:  KPAFRELGTCHN, LAMBDASER, KAPLAMBRATIO No results found for: Kandis Cocking, [facility] Lab Results  Component Value Date   TOTALPROTELP 7.0 06/28/2010   ALBUMINELP 56.3 06/28/2010   A1GS 8.7 (H) 06/28/2010   A2GS 7.9 06/28/2010   BETS 7.5 (H) 06/28/2010   BETA2SER 4.7 06/28/2010   GAMS 14.9 06/28/2010   MSPIKE NOT DET 06/28/2010   SPEI * 06/28/2010     Chemistry      Component Value Date/Time   NA 140 08/24/2019 0732   NA 140 09/17/2017 1507   K 3.9 08/24/2019 0732   K 3.9 09/17/2017 1507   CL 100 08/24/2019 0732   CL 99 04/17/2017 1411   CO2 28 08/24/2019 0732   CO2 30 (H) 09/17/2017 1507   BUN 10 08/24/2019 0732   BUN 8.9 09/17/2017 1507   CREATININE  1.12 (H) 08/24/2019 0732   CREATININE 1.2 (H) 09/17/2017 1507      Component Value Date/Time   CALCIUM 9.3 08/24/2019 0732   CALCIUM 9.3 09/17/2017 1507   ALKPHOS 104 08/24/2019 0732   ALKPHOS 105 09/17/2017 1507   AST 19 08/24/2019 0732   AST 15 09/17/2017 1507   ALT 23 08/24/2019 0732   ALT 18 09/17/2017 1507   BILITOT 0.3 08/24/2019 0732   BILITOT 0.38 09/17/2017 1507       Impression and Plan: Janice Martin is a very pleasant 58 yo caucasian female with history of relapsed chronic ITP.  Platelet count today is 262. After speaking with Dr. Marin Olp, we will reduce her Nplate dose by half.  We will continue to check her lab work weekly and give Nplate if needed. Follow-up in 1 month.  She will contact our office with any questions or concerns. We can certainly see her sooner if needed.   Laverna Peace, NP 12/23/20208:51 AM

## 2019-10-12 NOTE — Progress Notes (Signed)
PLTC>200 x 2 weeks.   Per protocol would decrease dose by 1 mcg/kg, but that is the patient's current dose. Will half dose to 0.5 mcg/kg today per Dr. Antonieta Pert instructions.

## 2019-10-19 ENCOUNTER — Other Ambulatory Visit: Payer: Self-pay

## 2019-10-19 ENCOUNTER — Inpatient Hospital Stay: Payer: 59

## 2019-10-19 VITALS — BP 142/78 | HR 82 | Temp 98.2°F | Resp 18

## 2019-10-19 DIAGNOSIS — I2699 Other pulmonary embolism without acute cor pulmonale: Secondary | ICD-10-CM | POA: Diagnosis not present

## 2019-10-19 DIAGNOSIS — D693 Immune thrombocytopenic purpura: Secondary | ICD-10-CM | POA: Diagnosis not present

## 2019-10-19 DIAGNOSIS — Z7901 Long term (current) use of anticoagulants: Secondary | ICD-10-CM | POA: Diagnosis not present

## 2019-10-19 LAB — CBC WITH DIFFERENTIAL (CANCER CENTER ONLY)
Abs Immature Granulocytes: 0.07 10*3/uL (ref 0.00–0.07)
Basophils Absolute: 0.2 10*3/uL — ABNORMAL HIGH (ref 0.0–0.1)
Basophils Relative: 1 %
Eosinophils Absolute: 0.1 10*3/uL (ref 0.0–0.5)
Eosinophils Relative: 1 %
HCT: 39.9 % (ref 36.0–46.0)
Hemoglobin: 13.3 g/dL (ref 12.0–15.0)
Immature Granulocytes: 1 %
Lymphocytes Relative: 32 %
Lymphs Abs: 3.8 10*3/uL (ref 0.7–4.0)
MCH: 30.2 pg (ref 26.0–34.0)
MCHC: 33.3 g/dL (ref 30.0–36.0)
MCV: 90.5 fL (ref 80.0–100.0)
Monocytes Absolute: 1.3 10*3/uL — ABNORMAL HIGH (ref 0.1–1.0)
Monocytes Relative: 11 %
Neutro Abs: 6.6 10*3/uL (ref 1.7–7.7)
Neutrophils Relative %: 54 %
Platelet Count: 255 10*3/uL (ref 150–400)
RBC: 4.41 MIL/uL (ref 3.87–5.11)
RDW: 13.6 % (ref 11.5–15.5)
WBC Count: 11.9 10*3/uL — ABNORMAL HIGH (ref 4.0–10.5)
nRBC: 0.2 % (ref 0.0–0.2)

## 2019-10-19 MED ORDER — ROMIPLOSTIM 250 MCG ~~LOC~~ SOLR
23.0000 ug | Freq: Once | SUBCUTANEOUS | Status: AC
  Administered 2019-10-19: 25 ug via SUBCUTANEOUS
  Filled 2019-10-19: qty 0.05

## 2019-10-19 NOTE — Progress Notes (Signed)
Patient's pltc remains > 200. Patient will receive 0.25 mcg/kg (rounded to 25 mcg for syringe administration) today per Dr. Antonieta Pert instructions. Orders updated to reflect change.  Leron Croak, PharmD, BCPS PGY2 Hematology/Oncology Pharmacy Resident 10/19/2019 8:19 AM

## 2019-10-19 NOTE — Patient Instructions (Signed)
Romiplostim injection What is this medicine? ROMIPLOSTIM (roe mi PLOE stim) helps your body make more platelets. This medicine is used to treat low platelets caused by chronic idiopathic thrombocytopenic purpura (ITP). This medicine may be used for other purposes; ask your health care provider or pharmacist if you have questions. COMMON BRAND NAME(S): Nplate What should I tell my health care provider before I take this medicine? They need to know if you have any of these conditions:  bleeding disorders  bone marrow problem, like blood cancer or myelodysplastic syndrome  history of blood clots  liver disease  surgery to remove your spleen  an unusual or allergic reaction to romiplostim, mannitol, other medicines, foods, dyes, or preservatives  pregnant or trying to get pregnant  breast-feeding How should I use this medicine? This medicine is for injection under the skin. It is given by a health care professional in a hospital or clinic setting. A special MedGuide will be given to you before your injection. Read this information carefully each time. Talk to your pediatrician regarding the use of this medicine in children. While this drug may be prescribed for children as young as 1 year for selected conditions, precautions do apply. Overdosage: If you think you have taken too much of this medicine contact a poison control center or emergency room at once. NOTE: This medicine is only for you. Do not share this medicine with others. What if I miss a dose? It is important not to miss your dose. Call your doctor or health care professional if you are unable to keep an appointment. What may interact with this medicine? Interactions are not expected. This list may not describe all possible interactions. Give your health care provider a list of all the medicines, herbs, non-prescription drugs, or dietary supplements you use. Also tell them if you smoke, drink alcohol, or use illegal drugs.  Some items may interact with your medicine. What should I watch for while using this medicine? Your condition will be monitored carefully while you are receiving this medicine. Visit your prescriber or health care professional for regular checks on your progress and for the needed blood tests. It is important to keep all appointments. What side effects may I notice from receiving this medicine? Side effects that you should report to your doctor or health care professional as soon as possible:  allergic reactions like skin rash, itching or hives, swelling of the face, lips, or tongue  signs and symptoms of bleeding such as bloody or black, tarry stools; red or dark brown urine; spitting up blood or brown material that looks like coffee grounds; red spots on the skin; unusual bruising or bleeding from the eyes, gums, or nose  signs and symptoms of a blood clot such as chest pain; shortness of breath; pain, swelling, or warmth in the leg  signs and symptoms of a stroke like changes in vision; confusion; trouble speaking or understanding; severe headaches; sudden numbness or weakness of the face, arm or leg; trouble walking; dizziness; loss of balance or coordination Side effects that usually do not require medical attention (report to your doctor or health care professional if they continue or are bothersome):  headache  pain in arms and legs  pain in mouth  stomach pain This list may not describe all possible side effects. Call your doctor for medical advice about side effects. You may report side effects to FDA at 1-800-FDA-1088. Where should I keep my medicine? This drug is given in a hospital or clinic   and will not be stored at home. NOTE: This sheet is a summary. It may not cover all possible information. If you have questions about this medicine, talk to your doctor, pharmacist, or health care provider.  2020 Elsevier/Gold Standard (2017-10-05 11:10:55)  

## 2019-10-24 ENCOUNTER — Encounter: Payer: Self-pay | Admitting: Hematology & Oncology

## 2019-10-26 ENCOUNTER — Inpatient Hospital Stay: Payer: 59

## 2019-10-26 ENCOUNTER — Inpatient Hospital Stay: Payer: 59 | Attending: Hematology & Oncology

## 2019-10-26 ENCOUNTER — Other Ambulatory Visit: Payer: Self-pay

## 2019-10-26 VITALS — BP 148/72 | HR 78 | Temp 98.2°F | Resp 18

## 2019-10-26 DIAGNOSIS — I2699 Other pulmonary embolism without acute cor pulmonale: Secondary | ICD-10-CM | POA: Diagnosis not present

## 2019-10-26 DIAGNOSIS — D693 Immune thrombocytopenic purpura: Secondary | ICD-10-CM | POA: Diagnosis not present

## 2019-10-26 DIAGNOSIS — Z79899 Other long term (current) drug therapy: Secondary | ICD-10-CM | POA: Insufficient documentation

## 2019-10-26 DIAGNOSIS — Z7901 Long term (current) use of anticoagulants: Secondary | ICD-10-CM | POA: Diagnosis not present

## 2019-10-26 LAB — CBC WITH DIFFERENTIAL (CANCER CENTER ONLY)
Abs Immature Granulocytes: 0.06 10*3/uL (ref 0.00–0.07)
Basophils Absolute: 0.2 10*3/uL — ABNORMAL HIGH (ref 0.0–0.1)
Basophils Relative: 2 %
Eosinophils Absolute: 0.2 10*3/uL (ref 0.0–0.5)
Eosinophils Relative: 2 %
HCT: 40.5 % (ref 36.0–46.0)
Hemoglobin: 13.7 g/dL (ref 12.0–15.0)
Immature Granulocytes: 1 %
Lymphocytes Relative: 35 %
Lymphs Abs: 3.8 10*3/uL (ref 0.7–4.0)
MCH: 30.5 pg (ref 26.0–34.0)
MCHC: 33.8 g/dL (ref 30.0–36.0)
MCV: 90.2 fL (ref 80.0–100.0)
Monocytes Absolute: 1 10*3/uL (ref 0.1–1.0)
Monocytes Relative: 9 %
Neutro Abs: 5.6 10*3/uL (ref 1.7–7.7)
Neutrophils Relative %: 51 %
Platelet Count: 232 10*3/uL (ref 150–400)
RBC: 4.49 MIL/uL (ref 3.87–5.11)
RDW: 13.5 % (ref 11.5–15.5)
WBC Count: 10.9 10*3/uL — ABNORMAL HIGH (ref 4.0–10.5)
nRBC: 0 % (ref 0.0–0.2)

## 2019-10-26 MED ORDER — ROMIPLOSTIM 250 MCG ~~LOC~~ SOLR
25.0000 ug | Freq: Once | SUBCUTANEOUS | Status: AC
  Administered 2019-10-26: 25 ug via SUBCUTANEOUS
  Filled 2019-10-26: qty 0.05

## 2019-11-02 ENCOUNTER — Inpatient Hospital Stay: Payer: 59

## 2019-11-02 ENCOUNTER — Other Ambulatory Visit: Payer: Self-pay

## 2019-11-02 VITALS — BP 132/72 | HR 72 | Temp 98.6°F | Resp 18

## 2019-11-02 DIAGNOSIS — I2699 Other pulmonary embolism without acute cor pulmonale: Secondary | ICD-10-CM | POA: Diagnosis not present

## 2019-11-02 DIAGNOSIS — D693 Immune thrombocytopenic purpura: Secondary | ICD-10-CM

## 2019-11-02 DIAGNOSIS — Z79899 Other long term (current) drug therapy: Secondary | ICD-10-CM | POA: Diagnosis not present

## 2019-11-02 DIAGNOSIS — Z7901 Long term (current) use of anticoagulants: Secondary | ICD-10-CM | POA: Diagnosis not present

## 2019-11-02 LAB — CBC WITH DIFFERENTIAL (CANCER CENTER ONLY)
Abs Immature Granulocytes: 0 10*3/uL (ref 0.00–0.07)
Basophils Absolute: 0 10*3/uL (ref 0.0–0.1)
Basophils Relative: 0 %
Eosinophils Absolute: 1 10*3/uL — ABNORMAL HIGH (ref 0.0–0.5)
Eosinophils Relative: 10 %
HCT: 39.9 % (ref 36.0–46.0)
Hemoglobin: 13.4 g/dL (ref 12.0–15.0)
Lymphocytes Relative: 33 %
Lymphs Abs: 3.3 10*3/uL (ref 0.7–4.0)
MCH: 30.7 pg (ref 26.0–34.0)
MCHC: 33.6 g/dL (ref 30.0–36.0)
MCV: 91.5 fL (ref 80.0–100.0)
Monocytes Absolute: 0.7 10*3/uL (ref 0.1–1.0)
Monocytes Relative: 7 %
Neutro Abs: 5.1 10*3/uL (ref 1.7–7.7)
Neutrophils Relative %: 50 %
Platelet Count: 195 10*3/uL (ref 150–400)
RBC: 4.36 MIL/uL (ref 3.87–5.11)
RDW: 13.7 % (ref 11.5–15.5)
WBC Count: 10.1 10*3/uL (ref 4.0–10.5)
nRBC: 0 % (ref 0.0–0.2)

## 2019-11-02 MED ORDER — ROMIPLOSTIM 250 MCG ~~LOC~~ SOLR
25.0000 ug | Freq: Once | SUBCUTANEOUS | Status: AC
  Administered 2019-11-02: 25 ug via SUBCUTANEOUS
  Filled 2019-11-02: qty 0.05

## 2019-11-09 ENCOUNTER — Inpatient Hospital Stay: Payer: 59

## 2019-11-09 ENCOUNTER — Other Ambulatory Visit: Payer: Self-pay

## 2019-11-09 VITALS — BP 142/70 | HR 76 | Temp 98.7°F | Resp 18

## 2019-11-09 DIAGNOSIS — D693 Immune thrombocytopenic purpura: Secondary | ICD-10-CM

## 2019-11-09 DIAGNOSIS — Z79899 Other long term (current) drug therapy: Secondary | ICD-10-CM | POA: Diagnosis not present

## 2019-11-09 DIAGNOSIS — I2699 Other pulmonary embolism without acute cor pulmonale: Secondary | ICD-10-CM | POA: Diagnosis not present

## 2019-11-09 DIAGNOSIS — Z7901 Long term (current) use of anticoagulants: Secondary | ICD-10-CM | POA: Diagnosis not present

## 2019-11-09 LAB — CBC WITH DIFFERENTIAL (CANCER CENTER ONLY)
Abs Immature Granulocytes: 0.04 10*3/uL (ref 0.00–0.07)
Basophils Absolute: 0.1 10*3/uL (ref 0.0–0.1)
Basophils Relative: 1 %
Eosinophils Absolute: 0.1 10*3/uL (ref 0.0–0.5)
Eosinophils Relative: 1 %
HCT: 41.1 % (ref 36.0–46.0)
Hemoglobin: 13.7 g/dL (ref 12.0–15.0)
Immature Granulocytes: 0 %
Lymphocytes Relative: 33 %
Lymphs Abs: 3.2 10*3/uL (ref 0.7–4.0)
MCH: 30.4 pg (ref 26.0–34.0)
MCHC: 33.3 g/dL (ref 30.0–36.0)
MCV: 91.1 fL (ref 80.0–100.0)
Monocytes Absolute: 1 10*3/uL (ref 0.1–1.0)
Monocytes Relative: 11 %
Neutro Abs: 5.2 10*3/uL (ref 1.7–7.7)
Neutrophils Relative %: 54 %
Platelet Count: 166 10*3/uL (ref 150–400)
RBC: 4.51 MIL/uL (ref 3.87–5.11)
RDW: 13.8 % (ref 11.5–15.5)
WBC Count: 9.7 10*3/uL (ref 4.0–10.5)
nRBC: 0 % (ref 0.0–0.2)

## 2019-11-09 MED ORDER — ROMIPLOSTIM 250 MCG ~~LOC~~ SOLR
50.0000 ug | Freq: Once | SUBCUTANEOUS | Status: AC
  Administered 2019-11-09: 50 ug via SUBCUTANEOUS
  Filled 2019-11-09: qty 0.1

## 2019-11-09 NOTE — Patient Instructions (Signed)
Romiplostim injection What is this medicine? ROMIPLOSTIM (roe mi PLOE stim) helps your body make more platelets. This medicine is used to treat low platelets caused by chronic idiopathic thrombocytopenic purpura (ITP). This medicine may be used for other purposes; ask your health care provider or pharmacist if you have questions. COMMON BRAND NAME(S): Nplate What should I tell my health care provider before I take this medicine? They need to know if you have any of these conditions:  bleeding disorders  bone marrow problem, like blood cancer or myelodysplastic syndrome  history of blood clots  liver disease  surgery to remove your spleen  an unusual or allergic reaction to romiplostim, mannitol, other medicines, foods, dyes, or preservatives  pregnant or trying to get pregnant  breast-feeding How should I use this medicine? This medicine is for injection under the skin. It is given by a health care professional in a hospital or clinic setting. A special MedGuide will be given to you before your injection. Read this information carefully each time. Talk to your pediatrician regarding the use of this medicine in children. While this drug may be prescribed for children as young as 1 year for selected conditions, precautions do apply. Overdosage: If you think you have taken too much of this medicine contact a poison control center or emergency room at once. NOTE: This medicine is only for you. Do not share this medicine with others. What if I miss a dose? It is important not to miss your dose. Call your doctor or health care professional if you are unable to keep an appointment. What may interact with this medicine? Interactions are not expected. This list may not describe all possible interactions. Give your health care provider a list of all the medicines, herbs, non-prescription drugs, or dietary supplements you use. Also tell them if you smoke, drink alcohol, or use illegal drugs.  Some items may interact with your medicine. What should I watch for while using this medicine? Your condition will be monitored carefully while you are receiving this medicine. Visit your prescriber or health care professional for regular checks on your progress and for the needed blood tests. It is important to keep all appointments. What side effects may I notice from receiving this medicine? Side effects that you should report to your doctor or health care professional as soon as possible:  allergic reactions like skin rash, itching or hives, swelling of the face, lips, or tongue  signs and symptoms of bleeding such as bloody or black, tarry stools; red or dark brown urine; spitting up blood or brown material that looks like coffee grounds; red spots on the skin; unusual bruising or bleeding from the eyes, gums, or nose  signs and symptoms of a blood clot such as chest pain; shortness of breath; pain, swelling, or warmth in the leg  signs and symptoms of a stroke like changes in vision; confusion; trouble speaking or understanding; severe headaches; sudden numbness or weakness of the face, arm or leg; trouble walking; dizziness; loss of balance or coordination Side effects that usually do not require medical attention (report to your doctor or health care professional if they continue or are bothersome):  headache  pain in arms and legs  pain in mouth  stomach pain This list may not describe all possible side effects. Call your doctor for medical advice about side effects. You may report side effects to FDA at 1-800-FDA-1088. Where should I keep my medicine? This drug is given in a hospital or clinic   and will not be stored at home. NOTE: This sheet is a summary. It may not cover all possible information. If you have questions about this medicine, talk to your doctor, pharmacist, or health care provider.  2020 Elsevier/Gold Standard (2017-10-05 11:10:55)  

## 2019-11-09 NOTE — Progress Notes (Signed)
Per Dr. Marin Olp give her 23mcg Nplate today

## 2019-11-15 ENCOUNTER — Other Ambulatory Visit: Payer: Self-pay | Admitting: *Deleted

## 2019-11-15 DIAGNOSIS — D693 Immune thrombocytopenic purpura: Secondary | ICD-10-CM

## 2019-11-16 ENCOUNTER — Inpatient Hospital Stay (HOSPITAL_BASED_OUTPATIENT_CLINIC_OR_DEPARTMENT_OTHER): Payer: 59 | Admitting: Hematology & Oncology

## 2019-11-16 ENCOUNTER — Inpatient Hospital Stay: Payer: 59

## 2019-11-16 ENCOUNTER — Encounter: Payer: Self-pay | Admitting: Hematology & Oncology

## 2019-11-16 ENCOUNTER — Other Ambulatory Visit: Payer: Self-pay

## 2019-11-16 ENCOUNTER — Telehealth: Payer: Self-pay | Admitting: Hematology & Oncology

## 2019-11-16 VITALS — BP 152/77 | HR 82 | Temp 97.3°F | Resp 20 | Wt 192.8 lb

## 2019-11-16 DIAGNOSIS — I2699 Other pulmonary embolism without acute cor pulmonale: Secondary | ICD-10-CM | POA: Diagnosis not present

## 2019-11-16 DIAGNOSIS — D693 Immune thrombocytopenic purpura: Secondary | ICD-10-CM

## 2019-11-16 DIAGNOSIS — Z7901 Long term (current) use of anticoagulants: Secondary | ICD-10-CM | POA: Diagnosis not present

## 2019-11-16 DIAGNOSIS — Z79899 Other long term (current) drug therapy: Secondary | ICD-10-CM | POA: Diagnosis not present

## 2019-11-16 LAB — CMP (CANCER CENTER ONLY)
ALT: 15 U/L (ref 0–44)
AST: 14 U/L — ABNORMAL LOW (ref 15–41)
Albumin: 4.1 g/dL (ref 3.5–5.0)
Alkaline Phosphatase: 105 U/L (ref 38–126)
Anion gap: 7 (ref 5–15)
BUN: 11 mg/dL (ref 6–20)
CO2: 30 mmol/L (ref 22–32)
Calcium: 9.5 mg/dL (ref 8.9–10.3)
Chloride: 101 mmol/L (ref 98–111)
Creatinine: 1.1 mg/dL — ABNORMAL HIGH (ref 0.44–1.00)
GFR, Est AFR Am: >60 mL/min (ref >60)
GFR, Estimated: 56 mL/min — ABNORMAL LOW (ref >60)
Glucose, Bld: 141 mg/dL — ABNORMAL HIGH (ref 70–99)
Potassium: 4.2 mmol/L (ref 3.5–5.1)
Sodium: 138 mmol/L (ref 135–145)
Total Bilirubin: 0.3 mg/dL (ref 0.3–1.2)
Total Protein: 7 g/dL (ref 6.5–8.1)

## 2019-11-16 LAB — CBC WITH DIFFERENTIAL (CANCER CENTER ONLY)
Abs Immature Granulocytes: 0.04 10*3/uL (ref 0.00–0.07)
Basophils Absolute: 0.2 10*3/uL — ABNORMAL HIGH (ref 0.0–0.1)
Basophils Relative: 2 %
Eosinophils Absolute: 0.2 10*3/uL (ref 0.0–0.5)
Eosinophils Relative: 1 %
HCT: 38.7 % (ref 36.0–46.0)
Hemoglobin: 13 g/dL (ref 12.0–15.0)
Immature Granulocytes: 0 %
Lymphocytes Relative: 36 %
Lymphs Abs: 4 10*3/uL (ref 0.7–4.0)
MCH: 30.9 pg (ref 26.0–34.0)
MCHC: 33.6 g/dL (ref 30.0–36.0)
MCV: 91.9 fL (ref 80.0–100.0)
Monocytes Absolute: 0.8 10*3/uL (ref 0.1–1.0)
Monocytes Relative: 7 %
Neutro Abs: 5.9 10*3/uL (ref 1.7–7.7)
Neutrophils Relative %: 54 %
Platelet Count: 191 10*3/uL (ref 150–400)
RBC: 4.21 MIL/uL (ref 3.87–5.11)
RDW: 13.6 % (ref 11.5–15.5)
WBC Count: 11.1 10*3/uL — ABNORMAL HIGH (ref 4.0–10.5)
nRBC: 0 % (ref 0.0–0.2)

## 2019-11-16 MED ORDER — ROMIPLOSTIM 250 MCG ~~LOC~~ SOLR
50.0000 ug | Freq: Once | SUBCUTANEOUS | Status: AC
  Administered 2019-11-16: 50 ug via SUBCUTANEOUS
  Filled 2019-11-16: qty 0.1

## 2019-11-16 NOTE — Patient Instructions (Signed)
Romiplostim injection What is this medicine? ROMIPLOSTIM (roe mi PLOE stim) helps your body make more platelets. This medicine is used to treat low platelets caused by chronic idiopathic thrombocytopenic purpura (ITP). This medicine may be used for other purposes; ask your health care provider or pharmacist if you have questions. COMMON BRAND NAME(S): Nplate What should I tell my health care provider before I take this medicine? They need to know if you have any of these conditions:  bleeding disorders  bone marrow problem, like blood cancer or myelodysplastic syndrome  history of blood clots  liver disease  surgery to remove your spleen  an unusual or allergic reaction to romiplostim, mannitol, other medicines, foods, dyes, or preservatives  pregnant or trying to get pregnant  breast-feeding How should I use this medicine? This medicine is for injection under the skin. It is given by a health care professional in a hospital or clinic setting. A special MedGuide will be given to you before your injection. Read this information carefully each time. Talk to your pediatrician regarding the use of this medicine in children. While this drug may be prescribed for children as young as 1 year for selected conditions, precautions do apply. Overdosage: If you think you have taken too much of this medicine contact a poison control center or emergency room at once. NOTE: This medicine is only for you. Do not share this medicine with others. What if I miss a dose? It is important not to miss your dose. Call your doctor or health care professional if you are unable to keep an appointment. What may interact with this medicine? Interactions are not expected. This list may not describe all possible interactions. Give your health care provider a list of all the medicines, herbs, non-prescription drugs, or dietary supplements you use. Also tell them if you smoke, drink alcohol, or use illegal drugs.  Some items may interact with your medicine. What should I watch for while using this medicine? Your condition will be monitored carefully while you are receiving this medicine. Visit your prescriber or health care professional for regular checks on your progress and for the needed blood tests. It is important to keep all appointments. What side effects may I notice from receiving this medicine? Side effects that you should report to your doctor or health care professional as soon as possible:  allergic reactions like skin rash, itching or hives, swelling of the face, lips, or tongue  signs and symptoms of bleeding such as bloody or black, tarry stools; red or dark brown urine; spitting up blood or brown material that looks like coffee grounds; red spots on the skin; unusual bruising or bleeding from the eyes, gums, or nose  signs and symptoms of a blood clot such as chest pain; shortness of breath; pain, swelling, or warmth in the leg  signs and symptoms of a stroke like changes in vision; confusion; trouble speaking or understanding; severe headaches; sudden numbness or weakness of the face, arm or leg; trouble walking; dizziness; loss of balance or coordination Side effects that usually do not require medical attention (report to your doctor or health care professional if they continue or are bothersome):  headache  pain in arms and legs  pain in mouth  stomach pain This list may not describe all possible side effects. Call your doctor for medical advice about side effects. You may report side effects to FDA at 1-800-FDA-1088. Where should I keep my medicine? This drug is given in a hospital or clinic   and will not be stored at home. NOTE: This sheet is a summary. It may not cover all possible information. If you have questions about this medicine, talk to your doctor, pharmacist, or health care provider.  2020 Elsevier/Gold Standard (2017-10-05 11:10:55)  

## 2019-11-16 NOTE — Progress Notes (Signed)
Per MD, dose of Nplate will be 50 mcg and he is changing to q2week dosing at this point.  Kennith Center, Pharm.D., CPP 11/16/2019@8 :47 AM

## 2019-11-16 NOTE — Progress Notes (Signed)
Hematology and Oncology Follow Up Visit  LILLION ORCUTT [id number] April 24, 1961 59 y.o. 11/16/2019   Principle Diagnosis:   Chronic immune thrombocytopenia-recurrent  Submassive pulmonary embolism, with right lower extremity thrombus  Current Therapy:    Xarelto 10 mg by mouth daily   Decadron-taper per protocol for relapsed thrombocytopenia  Nplate q  2 weeks for platelet count < 300K -- change on 11/16/2019     Interim History:  Ms.  Rozelle is back for followup.  She is doing fine.  She had no problems over the holidays.  She and her husband are still getting into their new home.  She has had no problems with bleeding or bruising.  She is on the Xarelto and doing well with this.  We want to see about moving her Nplate out to every 2 weeks now.  I think we can try this.  Her platelet count has been holding pretty steady.  She has had no issues with nausea or vomiting.  There is been no problems with fever.  She has had one coronavirus vaccine.  She has had no problems with bowels or bladder.  She has had no headache.  There is been no leg swelling.  Overall, her performance status is ECOG 1.    Medications:  Current Outpatient Medications:  .  nebivolol (BYSTOLIC) 5 MG tablet, Take 1 tablet (5 mg total) by mouth daily., Disp: 90 tablet, Rfl: 1 .  valsartan-hydrochlorothiazide (DIOVAN-HCT) 320-25 MG tablet, Take 1 tablet by mouth daily., Disp: 90 tablet, Rfl: 1 .  vitamin B-12 (CYANOCOBALAMIN) 100 MCG tablet, Take 100 mcg by mouth at bedtime. , Disp: , Rfl:  .  XARELTO 10 MG TABS tablet, TAKE 1 TABLET BY MOUTH ONCE DAILY WITH SUPPER, Disp: 90 tablet, Rfl: 3 No current facility-administered medications for this visit.  Facility-Administered Medications Ordered in Other Visits:  .  romiPLOStim (NPLATE) injection 90 mcg, 1 mcg/kg, Subcutaneous, Once, Kris Burd, Rudell Cobb, MD  Allergies:  Allergies  Allergen Reactions  . Bee Venom Anaphylaxis, Shortness Of Breath and Swelling  .  Influenza Vaccines Other (See Comments)    Per pt can't take mess up with her immune system  . Morphine Other (See Comments)    Burns really bad  . Shellfish Allergy Anaphylaxis    Past Medical History, Surgical history, Social history, and Family History were reviewed and updated.  Review of Systems: Review of Systems  Constitutional: Negative.   HENT: Negative.   Eyes: Negative.   Respiratory: Negative.   Cardiovascular: Negative.   Gastrointestinal: Negative.   Genitourinary: Negative.   Musculoskeletal: Negative.   Skin: Negative.   Neurological: Negative.   Endo/Heme/Allergies: Negative.   Psychiatric/Behavioral: Negative.     Physical Exam:  weight is 192 lb 12.8 oz (87.5 kg). Her temporal temperature is 97.3 F (36.3 C) (abnormal). Her blood pressure is 152/77 (abnormal) and her pulse is 82. Her respiration is 20.   Physical Exam Vitals reviewed.  HENT:     Head: Normocephalic and atraumatic.  Eyes:     Pupils: Pupils are equal, round, and reactive to light.  Cardiovascular:     Rate and Rhythm: Normal rate and regular rhythm.     Heart sounds: Normal heart sounds.  Pulmonary:     Effort: Pulmonary effort is normal.     Breath sounds: Normal breath sounds.  Abdominal:     General: Bowel sounds are normal.     Palpations: Abdomen is soft.     Comments: She has well-healed  splenectomy scar.  No fluid wave.  No palpable liver.  No guarding or rebound tenderness.  Musculoskeletal:        General: No tenderness or deformity. Normal range of motion.     Cervical back: Normal range of motion.  Lymphadenopathy:     Cervical: No cervical adenopathy.  Skin:    General: Skin is warm and dry.     Findings: No erythema or rash.  Neurological:     Mental Status: She is alert and oriented to person, place, and time.  Psychiatric:        Behavior: Behavior normal.        Thought Content: Thought content normal.        Judgment: Judgment normal.     Lab Results   Component Value Date   WBC 11.1 (H) 11/16/2019   HGB 13.0 11/16/2019   HCT 38.7 11/16/2019   MCV 91.9 11/16/2019   PLT 191 11/16/2019     Chemistry      Component Value Date/Time   NA 138 11/16/2019 0741   NA 140 09/17/2017 1507   K 4.2 11/16/2019 0741   K 3.9 09/17/2017 1507   CL 101 11/16/2019 0741   CL 99 04/17/2017 1411   CO2 30 11/16/2019 0741   CO2 30 (H) 09/17/2017 1507   BUN 11 11/16/2019 0741   BUN 8.9 09/17/2017 1507   CREATININE 1.10 (H) 11/16/2019 0741   CREATININE 1.2 (H) 09/17/2017 1507      Component Value Date/Time   CALCIUM 9.5 11/16/2019 0741   CALCIUM 9.3 09/17/2017 1507   ALKPHOS 105 11/16/2019 0741   ALKPHOS 105 09/17/2017 1507   AST 14 (L) 11/16/2019 0741   AST 15 09/17/2017 1507   ALT 15 11/16/2019 0741   ALT 18 09/17/2017 1507   BILITOT 0.3 11/16/2019 0741   BILITOT 0.38 09/17/2017 1507       Impression and Plan: Ms. Burri is a 59 year old white female. She has a history of relapsed chronic immune thrombocytopenia.   I looked at her blood under the microscope.  She had some Howell-Jolly bodies with some of the red cells.  I do not see any evidence that she has developed a new spleen.  Her platelets were quite prominent.  She had large platelets.  Again, we will try Nplate every 2 weeks now.  We will plan to see her back here in 6 weeks.    [name], MD 1/27/20218:32 AM

## 2019-11-16 NOTE — Telephone Encounter (Signed)
Appointments scheduled patient will get update from My Chart/ 1/27 los

## 2019-11-30 ENCOUNTER — Inpatient Hospital Stay: Payer: 59

## 2019-11-30 ENCOUNTER — Inpatient Hospital Stay: Payer: 59 | Attending: Hematology & Oncology

## 2019-11-30 ENCOUNTER — Other Ambulatory Visit: Payer: 59

## 2019-11-30 ENCOUNTER — Other Ambulatory Visit: Payer: Self-pay

## 2019-11-30 ENCOUNTER — Ambulatory Visit: Payer: 59

## 2019-11-30 DIAGNOSIS — D693 Immune thrombocytopenic purpura: Secondary | ICD-10-CM | POA: Insufficient documentation

## 2019-11-30 LAB — CMP (CANCER CENTER ONLY)
ALT: 21 U/L (ref 0–44)
AST: 17 U/L (ref 15–41)
Albumin: 3.7 g/dL (ref 3.5–5.0)
Alkaline Phosphatase: 100 U/L (ref 38–126)
Anion gap: 12 (ref 5–15)
BUN: 8 mg/dL (ref 6–20)
CO2: 28 mmol/L (ref 22–32)
Calcium: 9.1 mg/dL (ref 8.9–10.3)
Chloride: 102 mmol/L (ref 98–111)
Creatinine: 0.94 mg/dL (ref 0.44–1.00)
GFR, Est AFR Am: >60 mL/min (ref >60)
GFR, Estimated: >60 mL/min (ref >60)
Glucose, Bld: 90 mg/dL (ref 70–99)
Potassium: 3.6 mmol/L (ref 3.5–5.1)
Sodium: 142 mmol/L (ref 135–145)
Total Bilirubin: 0.3 mg/dL (ref 0.3–1.2)
Total Protein: 7.3 g/dL (ref 6.5–8.1)

## 2019-11-30 LAB — CBC WITH DIFFERENTIAL (CANCER CENTER ONLY)
Abs Immature Granulocytes: 0.04 10*3/uL (ref 0.00–0.07)
Basophils Absolute: 0.2 10*3/uL — ABNORMAL HIGH (ref 0.0–0.1)
Basophils Relative: 2 %
Eosinophils Absolute: 0.1 10*3/uL (ref 0.0–0.5)
Eosinophils Relative: 1 %
HCT: 39.1 % (ref 36.0–46.0)
Hemoglobin: 13.4 g/dL (ref 12.0–15.0)
Immature Granulocytes: 0 %
Lymphocytes Relative: 38 %
Lymphs Abs: 4.2 10*3/uL — ABNORMAL HIGH (ref 0.7–4.0)
MCH: 31.1 pg (ref 26.0–34.0)
MCHC: 34.3 g/dL (ref 30.0–36.0)
MCV: 90.7 fL (ref 80.0–100.0)
Monocytes Absolute: 1 10*3/uL (ref 0.1–1.0)
Monocytes Relative: 9 %
Neutro Abs: 5.5 10*3/uL (ref 1.7–7.7)
Neutrophils Relative %: 50 %
Platelet Count: 361 10*3/uL (ref 150–400)
RBC: 4.31 MIL/uL (ref 3.87–5.11)
RDW: 13.8 % (ref 11.5–15.5)
WBC Count: 10.9 10*3/uL — ABNORMAL HIGH (ref 4.0–10.5)
nRBC: 0 % (ref 0.0–0.2)

## 2019-11-30 LAB — SAVE SMEAR(SSMR), FOR PROVIDER SLIDE REVIEW

## 2019-11-30 NOTE — Progress Notes (Signed)
Plt= 361 today Hold Nplate per Dr Marin Olp

## 2019-12-14 ENCOUNTER — Inpatient Hospital Stay: Payer: 59

## 2019-12-14 ENCOUNTER — Other Ambulatory Visit: Payer: Self-pay

## 2019-12-14 ENCOUNTER — Ambulatory Visit: Payer: 59

## 2019-12-14 ENCOUNTER — Other Ambulatory Visit: Payer: 59

## 2019-12-14 VITALS — BP 128/72 | HR 78 | Temp 98.2°F | Resp 18

## 2019-12-14 DIAGNOSIS — D693 Immune thrombocytopenic purpura: Secondary | ICD-10-CM

## 2019-12-14 LAB — CBC WITH DIFFERENTIAL (CANCER CENTER ONLY)
Abs Immature Granulocytes: 0.05 10*3/uL (ref 0.00–0.07)
Basophils Absolute: 0.2 10*3/uL — ABNORMAL HIGH (ref 0.0–0.1)
Basophils Relative: 2 %
Eosinophils Absolute: 0.1 10*3/uL (ref 0.0–0.5)
Eosinophils Relative: 1 %
HCT: 42.9 % (ref 36.0–46.0)
Hemoglobin: 14.3 g/dL (ref 12.0–15.0)
Immature Granulocytes: 0 %
Lymphocytes Relative: 35 %
Lymphs Abs: 4.3 10*3/uL — ABNORMAL HIGH (ref 0.7–4.0)
MCH: 30 pg (ref 26.0–34.0)
MCHC: 33.3 g/dL (ref 30.0–36.0)
MCV: 89.9 fL (ref 80.0–100.0)
Monocytes Absolute: 1.1 10*3/uL — ABNORMAL HIGH (ref 0.1–1.0)
Monocytes Relative: 9 %
Neutro Abs: 6.7 10*3/uL (ref 1.7–7.7)
Neutrophils Relative %: 53 %
Platelet Count: 77 10*3/uL — ABNORMAL LOW (ref 150–400)
RBC: 4.77 MIL/uL (ref 3.87–5.11)
RDW: 13.9 % (ref 11.5–15.5)
WBC Count: 12.5 10*3/uL — ABNORMAL HIGH (ref 4.0–10.5)
nRBC: 0 % (ref 0.0–0.2)

## 2019-12-14 MED ORDER — ROMIPLOSTIM 250 MCG ~~LOC~~ SOLR
50.0000 ug | Freq: Once | SUBCUTANEOUS | Status: AC
  Administered 2019-12-14: 50 ug via SUBCUTANEOUS
  Filled 2019-12-14: qty 0.1

## 2019-12-14 NOTE — Progress Notes (Signed)
Pltc 77. It has been 4 weeks since last dose of NPlate. Will leave the dose at NPlate 50 mcg today per Dr. Marin Olp.

## 2019-12-28 ENCOUNTER — Other Ambulatory Visit: Payer: Self-pay

## 2019-12-28 ENCOUNTER — Inpatient Hospital Stay (HOSPITAL_BASED_OUTPATIENT_CLINIC_OR_DEPARTMENT_OTHER): Payer: 59 | Admitting: Hematology & Oncology

## 2019-12-28 ENCOUNTER — Encounter: Payer: Self-pay | Admitting: Hematology & Oncology

## 2019-12-28 ENCOUNTER — Inpatient Hospital Stay: Payer: 59 | Attending: Hematology & Oncology

## 2019-12-28 ENCOUNTER — Inpatient Hospital Stay: Payer: 59

## 2019-12-28 VITALS — BP 153/80 | HR 79 | Temp 97.3°F | Resp 20 | Wt 193.8 lb

## 2019-12-28 DIAGNOSIS — Z86711 Personal history of pulmonary embolism: Secondary | ICD-10-CM | POA: Diagnosis not present

## 2019-12-28 DIAGNOSIS — Z86718 Personal history of other venous thrombosis and embolism: Secondary | ICD-10-CM | POA: Insufficient documentation

## 2019-12-28 DIAGNOSIS — Z7901 Long term (current) use of anticoagulants: Secondary | ICD-10-CM | POA: Diagnosis not present

## 2019-12-28 DIAGNOSIS — I825Z1 Chronic embolism and thrombosis of unspecified deep veins of right distal lower extremity: Secondary | ICD-10-CM | POA: Diagnosis not present

## 2019-12-28 DIAGNOSIS — Z79899 Other long term (current) drug therapy: Secondary | ICD-10-CM | POA: Diagnosis not present

## 2019-12-28 DIAGNOSIS — D693 Immune thrombocytopenic purpura: Secondary | ICD-10-CM | POA: Diagnosis not present

## 2019-12-28 LAB — CBC WITH DIFFERENTIAL (CANCER CENTER ONLY)
Abs Immature Granulocytes: 0.03 10*3/uL (ref 0.00–0.07)
Basophils Absolute: 0.2 10*3/uL — ABNORMAL HIGH (ref 0.0–0.1)
Basophils Relative: 2 %
Eosinophils Absolute: 0.1 10*3/uL (ref 0.0–0.5)
Eosinophils Relative: 1 %
HCT: 38.7 % (ref 36.0–46.0)
Hemoglobin: 13 g/dL (ref 12.0–15.0)
Immature Granulocytes: 0 %
Lymphocytes Relative: 31 %
Lymphs Abs: 3.3 10*3/uL (ref 0.7–4.0)
MCH: 30.7 pg (ref 26.0–34.0)
MCHC: 33.6 g/dL (ref 30.0–36.0)
MCV: 91.3 fL (ref 80.0–100.0)
Monocytes Absolute: 1 10*3/uL (ref 0.1–1.0)
Monocytes Relative: 9 %
Neutro Abs: 6 10*3/uL (ref 1.7–7.7)
Neutrophils Relative %: 57 %
Platelet Count: 309 10*3/uL (ref 150–400)
RBC: 4.24 MIL/uL (ref 3.87–5.11)
RDW: 14 % (ref 11.5–15.5)
WBC Count: 10.5 10*3/uL (ref 4.0–10.5)
nRBC: 0 % (ref 0.0–0.2)

## 2019-12-28 NOTE — Progress Notes (Signed)
Hematology and Oncology Follow Up Visit  CHERA FLADUNG [id number] 1961-05-02 59 y.o. 12/28/2019   Principle Diagnosis:   Chronic immune thrombocytopenia-recurrent  Submassive pulmonary embolism, with right lower extremity thrombus  Current Therapy:    Xarelto 10 mg by mouth daily   Decadron-taper per protocol for relapsed thrombocytopenia  Nplate q 3 weeks for platelet count < 300K -- change on 12/28/2019     Interim History:  Ms.  Brimberry is back for followup.  She is doing fine.  She did have a problem with the second coronavirus vaccine.  Sound like she had a pretty severe immune response to it.  She thankfully got over this.  Her platelet count is great today.  She does not need any Nplate.  We will try to move her Nplate injections out to every 3 weeks now.  There is been no problems with bleeding.  She has had no cough or shortness of breath.  Is been no chest wall pain.  She has had no change in bowel or bladder habits.  She has had no rashes.  There is been no leg swelling.  She is on Xarelto and doing well with Xarelto.  She works in [employer].  She is quite busy as they are short handed over there.  Overall, her performance status is now ECOG 0.      Medications:  Current Outpatient Medications:  .  nebivolol (BYSTOLIC) 5 MG tablet, Take 1 tablet (5 mg total) by mouth daily., Disp: 90 tablet, Rfl: 1 .  valsartan-hydrochlorothiazide (DIOVAN-HCT) 320-25 MG tablet, Take 1 tablet by mouth daily., Disp: 90 tablet, Rfl: 1 .  vitamin B-12 (CYANOCOBALAMIN) 100 MCG tablet, Take 100 mcg by mouth at bedtime. , Disp: , Rfl:  .  XARELTO 10 MG TABS tablet, TAKE 1 TABLET BY MOUTH ONCE DAILY WITH SUPPER, Disp: 90 tablet, Rfl: 3  Allergies:  Allergies  Allergen Reactions  . Bee Venom Anaphylaxis, Shortness Of Breath and Swelling  . Influenza Vaccines Other (See Comments)    Per pt can't take mess up with her immune system  . Morphine Other (See Comments)    Burns really  bad  . Shellfish Allergy Anaphylaxis    Past Medical History, Surgical history, Social history, and Family History were reviewed and updated.  Review of Systems: Review of Systems  Constitutional: Negative.   HENT: Negative.   Eyes: Negative.   Respiratory: Negative.   Cardiovascular: Negative.   Gastrointestinal: Negative.   Genitourinary: Negative.   Musculoskeletal: Negative.   Skin: Negative.   Neurological: Negative.   Endo/Heme/Allergies: Negative.   Psychiatric/Behavioral: Negative.     Physical Exam:  weight is 193 lb 12.8 oz (87.9 kg). Her temporal temperature is 97.3 F (36.3 C) (abnormal). Her blood pressure is 153/80 (abnormal) and her pulse is 79. Her respiration is 20 and oxygen saturation is 100%.   Physical Exam Vitals reviewed.  HENT:     Head: Normocephalic and atraumatic.  Eyes:     Pupils: Pupils are equal, round, and reactive to light.  Cardiovascular:     Rate and Rhythm: Normal rate and regular rhythm.     Heart sounds: Normal heart sounds.  Pulmonary:     Effort: Pulmonary effort is normal.     Breath sounds: Normal breath sounds.  Abdominal:     General: Bowel sounds are normal.     Palpations: Abdomen is soft.     Comments: She has well-healed splenectomy scar.  No fluid wave.  No  palpable liver.  No guarding or rebound tenderness.  Musculoskeletal:        General: No tenderness or deformity. Normal range of motion.     Cervical back: Normal range of motion.  Lymphadenopathy:     Cervical: No cervical adenopathy.  Skin:    General: Skin is warm and dry.     Findings: No erythema or rash.  Neurological:     Mental Status: She is alert and oriented to person, place, and time.  Psychiatric:        Behavior: Behavior normal.        Thought Content: Thought content normal.        Judgment: Judgment normal.     Lab Results  Component Value Date   WBC 10.5 12/28/2019   HGB 13.0 12/28/2019   HCT 38.7 12/28/2019   MCV 91.3 12/28/2019    PLT 309 12/28/2019     Chemistry      Component Value Date/Time   NA 142 11/30/2019 0730   NA 140 09/17/2017 1507   K 3.6 11/30/2019 0730   K 3.9 09/17/2017 1507   CL 102 11/30/2019 0730   CL 99 04/17/2017 1411   CO2 28 11/30/2019 0730   CO2 30 (H) 09/17/2017 1507   BUN 8 11/30/2019 0730   BUN 8.9 09/17/2017 1507   CREATININE 0.94 11/30/2019 0730   CREATININE 1.2 (H) 09/17/2017 1507      Component Value Date/Time   CALCIUM 9.1 11/30/2019 0730   CALCIUM 9.3 09/17/2017 1507   ALKPHOS 100 11/30/2019 0730   ALKPHOS 105 09/17/2017 1507   AST 17 11/30/2019 0730   AST 15 09/17/2017 1507   ALT 21 11/30/2019 0730   ALT 18 09/17/2017 1507   BILITOT 0.3 11/30/2019 0730   BILITOT 0.38 09/17/2017 1507       Impression and Plan: Ms. Royalty is a 59 year old white female. She has a history of relapsed chronic immune thrombocytopenia.   I looked at her blood under the microscope.  She had some Howell-Jolly bodies with some of the red cells.  I do not see any evidence that she has developed a new spleen.  Her platelets were quite prominent.  She had large platelets.  We will try to move her Nplate injections every 3 weeks now.  Hopefully, she will continue to do well.    We will plan to see her back here in 6 weeks.    [name], MD 3/10/20218:14 AM

## 2019-12-29 MED FILL — VALSARTAN-HCTZ 320-25 MG TA: 320-25 | 30 days supply | Qty: 30 | Fill #4

## 2020-01-13 ENCOUNTER — Telehealth: Payer: Self-pay | Admitting: *Deleted

## 2020-01-13 ENCOUNTER — Inpatient Hospital Stay: Payer: 59

## 2020-01-13 ENCOUNTER — Other Ambulatory Visit: Payer: Self-pay

## 2020-01-13 VITALS — BP 142/72 | HR 72 | Temp 98.2°F | Resp 18

## 2020-01-13 DIAGNOSIS — Z79899 Other long term (current) drug therapy: Secondary | ICD-10-CM | POA: Diagnosis not present

## 2020-01-13 DIAGNOSIS — D693 Immune thrombocytopenic purpura: Secondary | ICD-10-CM

## 2020-01-13 DIAGNOSIS — Z86718 Personal history of other venous thrombosis and embolism: Secondary | ICD-10-CM | POA: Diagnosis not present

## 2020-01-13 DIAGNOSIS — Z7901 Long term (current) use of anticoagulants: Secondary | ICD-10-CM | POA: Diagnosis not present

## 2020-01-13 DIAGNOSIS — Z86711 Personal history of pulmonary embolism: Secondary | ICD-10-CM | POA: Diagnosis not present

## 2020-01-13 LAB — CBC WITH DIFFERENTIAL (CANCER CENTER ONLY)
Abs Immature Granulocytes: 0.04 10*3/uL (ref 0.00–0.07)
Basophils Absolute: 0.2 10*3/uL — ABNORMAL HIGH (ref 0.0–0.1)
Basophils Relative: 2 %
Eosinophils Absolute: 0.9 10*3/uL — ABNORMAL HIGH (ref 0.0–0.5)
Eosinophils Relative: 9 %
HCT: 39.1 % (ref 36.0–46.0)
Hemoglobin: 13.4 g/dL (ref 12.0–15.0)
Immature Granulocytes: 0 %
Lymphocytes Relative: 34 %
Lymphs Abs: 3.7 10*3/uL (ref 0.7–4.0)
MCH: 31 pg (ref 26.0–34.0)
MCHC: 34.3 g/dL (ref 30.0–36.0)
MCV: 90.5 fL (ref 80.0–100.0)
Monocytes Absolute: 0.9 10*3/uL (ref 0.1–1.0)
Monocytes Relative: 8 %
Neutro Abs: 5.1 10*3/uL (ref 1.7–7.7)
Neutrophils Relative %: 47 %
Platelet Count: 27 10*3/uL — ABNORMAL LOW (ref 150–400)
RBC: 4.32 MIL/uL (ref 3.87–5.11)
RDW: 13.8 % (ref 11.5–15.5)
WBC Count: 10.9 10*3/uL — ABNORMAL HIGH (ref 4.0–10.5)
nRBC: 0 % (ref 0.0–0.2)

## 2020-01-13 LAB — CMP (CANCER CENTER ONLY)
ALT: 15 U/L (ref 0–44)
AST: 14 U/L — ABNORMAL LOW (ref 15–41)
Albumin: 3.7 g/dL (ref 3.5–5.0)
Alkaline Phosphatase: 117 U/L (ref 38–126)
Anion gap: 7 (ref 5–15)
BUN: 12 mg/dL (ref 6–20)
CO2: 30 mmol/L (ref 22–32)
Calcium: 9.1 mg/dL (ref 8.9–10.3)
Chloride: 102 mmol/L (ref 98–111)
Creatinine: 1.07 mg/dL — ABNORMAL HIGH (ref 0.44–1.00)
GFR, Est AFR Am: >60 mL/min (ref >60)
GFR, Estimated: 58 mL/min — ABNORMAL LOW (ref >60)
Glucose, Bld: 108 mg/dL — ABNORMAL HIGH (ref 70–99)
Potassium: 4.1 mmol/L (ref 3.5–5.1)
Sodium: 139 mmol/L (ref 135–145)
Total Bilirubin: 0.4 mg/dL (ref 0.3–1.2)
Total Protein: 7.2 g/dL (ref 6.5–8.1)

## 2020-01-13 MED ORDER — ROMIPLOSTIM 125 MCG ~~LOC~~ SOLR
50.0000 ug | Freq: Once | SUBCUTANEOUS | Status: AC
  Administered 2020-01-13: 50 ug via SUBCUTANEOUS
  Filled 2020-01-13: qty 0.1

## 2020-01-13 NOTE — Telephone Encounter (Signed)
Message received from patient stating that she woke up with petechiae and would like to know if she can be scheduled for labs and possible nplate injection today.  Dr. Marin Olp notified and message sent to scheduling.

## 2020-01-13 NOTE — Patient Instructions (Signed)
Romiplostim injection What is this medicine? ROMIPLOSTIM (roe mi PLOE stim) helps your body make more platelets. This medicine is used to treat low platelets caused by chronic idiopathic thrombocytopenic purpura (ITP). This medicine may be used for other purposes; ask your health care provider or pharmacist if you have questions. COMMON BRAND NAME(S): Nplate What should I tell my health care provider before I take this medicine? They need to know if you have any of these conditions:  bleeding disorders  bone marrow problem, like blood cancer or myelodysplastic syndrome  history of blood clots  liver disease  surgery to remove your spleen  an unusual or allergic reaction to romiplostim, mannitol, other medicines, foods, dyes, or preservatives  pregnant or trying to get pregnant  breast-feeding How should I use this medicine? This medicine is for injection under the skin. It is given by a health care professional in a hospital or clinic setting. A special MedGuide will be given to you before your injection. Read this information carefully each time. Talk to your pediatrician regarding the use of this medicine in children. While this drug may be prescribed for children as young as 1 year for selected conditions, precautions do apply. Overdosage: If you think you have taken too much of this medicine contact a poison control center or emergency room at once. NOTE: This medicine is only for you. Do not share this medicine with others. What if I miss a dose? It is important not to miss your dose. Call your doctor or health care professional if you are unable to keep an appointment. What may interact with this medicine? Interactions are not expected. This list may not describe all possible interactions. Give your health care provider a list of all the medicines, herbs, non-prescription drugs, or dietary supplements you use. Also tell them if you smoke, drink alcohol, or use illegal drugs.  Some items may interact with your medicine. What should I watch for while using this medicine? Your condition will be monitored carefully while you are receiving this medicine. Visit your prescriber or health care professional for regular checks on your progress and for the needed blood tests. It is important to keep all appointments. What side effects may I notice from receiving this medicine? Side effects that you should report to your doctor or health care professional as soon as possible:  allergic reactions like skin rash, itching or hives, swelling of the face, lips, or tongue  signs and symptoms of bleeding such as bloody or black, tarry stools; red or dark brown urine; spitting up blood or brown material that looks like coffee grounds; red spots on the skin; unusual bruising or bleeding from the eyes, gums, or nose  signs and symptoms of a blood clot such as chest pain; shortness of breath; pain, swelling, or warmth in the leg  signs and symptoms of a stroke like changes in vision; confusion; trouble speaking or understanding; severe headaches; sudden numbness or weakness of the face, arm or leg; trouble walking; dizziness; loss of balance or coordination Side effects that usually do not require medical attention (report to your doctor or health care professional if they continue or are bothersome):  headache  pain in arms and legs  pain in mouth  stomach pain This list may not describe all possible side effects. Call your doctor for medical advice about side effects. You may report side effects to FDA at 1-800-FDA-1088. Where should I keep my medicine? This drug is given in a hospital or clinic   and will not be stored at home. NOTE: This sheet is a summary. It may not cover all possible information. If you have questions about this medicine, talk to your doctor, pharmacist, or health care provider.  2020 Elsevier/Gold Standard (2017-10-05 11:10:55)  

## 2020-01-18 ENCOUNTER — Ambulatory Visit: Payer: 59

## 2020-01-18 ENCOUNTER — Other Ambulatory Visit: Payer: 59

## 2020-02-08 ENCOUNTER — Other Ambulatory Visit: Payer: Self-pay | Admitting: *Deleted

## 2020-02-08 ENCOUNTER — Telehealth: Payer: Self-pay | Admitting: *Deleted

## 2020-02-08 ENCOUNTER — Inpatient Hospital Stay: Payer: 59 | Attending: Hematology & Oncology

## 2020-02-08 ENCOUNTER — Other Ambulatory Visit: Payer: Self-pay

## 2020-02-08 ENCOUNTER — Inpatient Hospital Stay: Payer: 59

## 2020-02-08 ENCOUNTER — Telehealth: Payer: Self-pay | Admitting: Hematology & Oncology

## 2020-02-08 ENCOUNTER — Inpatient Hospital Stay (HOSPITAL_BASED_OUTPATIENT_CLINIC_OR_DEPARTMENT_OTHER): Payer: 59 | Admitting: Hematology & Oncology

## 2020-02-08 ENCOUNTER — Encounter: Payer: Self-pay | Admitting: Hematology & Oncology

## 2020-02-08 VITALS — BP 173/82 | HR 94 | Temp 97.1°F | Resp 20 | Wt 191.4 lb

## 2020-02-08 DIAGNOSIS — Z7901 Long term (current) use of anticoagulants: Secondary | ICD-10-CM | POA: Diagnosis not present

## 2020-02-08 DIAGNOSIS — D693 Immune thrombocytopenic purpura: Secondary | ICD-10-CM

## 2020-02-08 DIAGNOSIS — Z86711 Personal history of pulmonary embolism: Secondary | ICD-10-CM | POA: Insufficient documentation

## 2020-02-08 LAB — CBC WITH DIFFERENTIAL (CANCER CENTER ONLY)
Abs Immature Granulocytes: 0.04 10*3/uL (ref 0.00–0.07)
Basophils Absolute: 0.2 10*3/uL — ABNORMAL HIGH (ref 0.0–0.1)
Basophils Relative: 2 %
Eosinophils Absolute: 0.1 10*3/uL (ref 0.0–0.5)
Eosinophils Relative: 1 %
HCT: 38.5 % (ref 36.0–46.0)
Hemoglobin: 13.3 g/dL (ref 12.0–15.0)
Immature Granulocytes: 0 %
Lymphocytes Relative: 29 %
Lymphs Abs: 3.2 10*3/uL (ref 0.7–4.0)
MCH: 31.1 pg (ref 26.0–34.0)
MCHC: 34.5 g/dL (ref 30.0–36.0)
MCV: 90 fL (ref 80.0–100.0)
Monocytes Absolute: 1 10*3/uL (ref 0.1–1.0)
Monocytes Relative: 9 %
Neutro Abs: 6.5 10*3/uL (ref 1.7–7.7)
Neutrophils Relative %: 59 %
Platelet Count: <5 10*3/uL — CL (ref 150–400)
RBC: 4.28 MIL/uL (ref 3.87–5.11)
RDW: 13.6 % (ref 11.5–15.5)
WBC Count: 11 10*3/uL — ABNORMAL HIGH (ref 4.0–10.5)
nRBC: 0 % (ref 0.0–0.2)

## 2020-02-08 MED ORDER — ROMIPLOSTIM 250 MCG ~~LOC~~ SOLR
2.0000 ug/kg | Freq: Once | SUBCUTANEOUS | Status: AC
  Administered 2020-02-08: 175 ug via SUBCUTANEOUS
  Filled 2020-02-08: qty 0.35

## 2020-02-08 MED ORDER — DEXAMETHASONE 4 MG PO TABS
ORAL_TABLET | ORAL | Status: AC
  Filled 2020-02-08: qty 5

## 2020-02-08 MED ORDER — DEXAMETHASONE 4 MG PO TABS
20.0000 mg | ORAL_TABLET | Freq: Once | ORAL | Status: AC
  Administered 2020-02-08: 09:00:00 20 mg via ORAL

## 2020-02-08 NOTE — Telephone Encounter (Signed)
Dr. Marin Olp notified of platelet count less than 5.  Pt to receive N-plate and oral Decadron per order of Dr. Marin Olp.

## 2020-02-08 NOTE — Progress Notes (Signed)
Hematology and Oncology Follow Up Visit  Janice Martin [id number] [birth year] 59 y.o. 02/08/2020   Principle Diagnosis:   Chronic immune thrombocytopenia-recurrent  Submassive pulmonary embolism, with right lower extremity thrombus  Current Therapy:    Xarelto 10 mg by mouth daily   Decadron-taper per protocol for relapsed thrombocytopenia  Nplate q 3 weeks for platelet count < 300K -- change on 12/28/2019     Interim History:  Janice Martin is back for followup.  Unfortunately, she is relapsing again.  Her platelet count is less than 5000 now.  She always knows when her platelet count goes down.  She develops petechia.  She is under a lot of stress at work.  I am sure this is part of the problem.  She has not had Nplate for probably 3 or 4 weeks.  She was is back on Decadron and we will do that today.  She has had no bleeding.  She still on the low-dose Xarelto.  I still think it is okay for her to be on Xarelto.  She has had no change in bowel or bladder habits.  She had a nice Easter with her family.  She and her family are planning on a nice beach vacation in July.  She has had no problems with rashes.  She has had no cough or shortness of breath.  There is been no headaches.  Overall, her performance status is ECOG 1.   Medications:  Current Outpatient Medications:  .  nebivolol (BYSTOLIC) 5 MG tablet, Take 1 tablet (5 mg total) by mouth daily., Disp: 90 tablet, Rfl: 1 .  valsartan-hydrochlorothiazide (DIOVAN-HCT) 320-25 MG tablet, Take 1 tablet by mouth daily., Disp: 90 tablet, Rfl: 1 .  vitamin B-12 (CYANOCOBALAMIN) 100 MCG tablet, Take 100 mcg by mouth at bedtime. , Disp: , Rfl:  .  XARELTO 10 MG TABS tablet, TAKE 1 TABLET BY MOUTH ONCE DAILY WITH SUPPER, Disp: 90 tablet, Rfl: 3  Allergies:  Allergies  Allergen Reactions  . Bee Venom Anaphylaxis, Shortness Of Breath and Swelling  . Influenza Vaccines Other (See Comments)    Per pt can't take mess up with her  immune system  . Morphine Other (See Comments)    Burns really bad  . Shellfish Allergy Anaphylaxis    Past Medical History, Surgical history, Social history, and Family History were reviewed and updated.  Review of Systems: Review of Systems  Constitutional: Negative.   HENT: Negative.   Eyes: Negative.   Respiratory: Negative.   Cardiovascular: Negative.   Gastrointestinal: Negative.   Genitourinary: Negative.   Musculoskeletal: Negative.   Skin: Negative.   Neurological: Negative.   Endo/Heme/Allergies: Negative.   Psychiatric/Behavioral: Negative.     Physical Exam:  weight is 191 lb 6.4 oz (86.8 kg). Her temporal temperature is 97.1 F (36.2 C) (abnormal). Her blood pressure is 173/82 (abnormal) and her pulse is 94. Her respiration is 20 and oxygen saturation is 99%.   Physical Exam Vitals reviewed.  HENT:     Head: Normocephalic and atraumatic.  Eyes:     Pupils: Pupils are equal, round, and reactive to light.  Cardiovascular:     Rate and Rhythm: Normal rate and regular rhythm.     Heart sounds: Normal heart sounds.  Pulmonary:     Effort: Pulmonary effort is normal.     Breath sounds: Normal breath sounds.  Abdominal:     General: Bowel sounds are normal.     Palpations: Abdomen is soft.  Comments: She has well-healed splenectomy scar.  No fluid wave.  No palpable liver.  No guarding or rebound tenderness.  Musculoskeletal:        General: No tenderness or deformity. Normal range of motion.     Cervical back: Normal range of motion.  Lymphadenopathy:     Cervical: No cervical adenopathy.  Skin:    General: Skin is warm and dry.     Findings: No erythema or rash.  Neurological:     Mental Status: She is alert and oriented to person, place, and time.  Psychiatric:        Behavior: Behavior normal.        Thought Content: Thought content normal.        Judgment: Judgment normal.     Lab Results  Component Value Date   WBC 11.0 (H) 02/08/2020    HGB 13.3 02/08/2020   HCT 38.5 02/08/2020   MCV 90.0 02/08/2020   PLT <5 (LL) 02/08/2020     Chemistry      Component Value Date/Time   NA 139 01/13/2020 0952   NA 140 09/17/2017 1507   K 4.1 01/13/2020 0952   K 3.9 09/17/2017 1507   CL 102 01/13/2020 0952   CL 99 04/17/2017 1411   CO2 30 01/13/2020 0952   CO2 30 (H) 09/17/2017 1507   BUN 12 01/13/2020 0952   BUN 8.9 09/17/2017 1507   CREATININE 1.07 (H) 01/13/2020 0952   CREATININE 1.2 (H) 09/17/2017 1507      Component Value Date/Time   CALCIUM 9.1 01/13/2020 0952   CALCIUM 9.3 09/17/2017 1507   ALKPHOS 117 01/13/2020 0952   ALKPHOS 105 09/17/2017 1507   AST 14 (L) 01/13/2020 0952   AST 15 09/17/2017 1507   ALT 15 01/13/2020 0952   ALT 18 09/17/2017 1507   BILITOT 0.4 01/13/2020 0952   BILITOT 0.38 09/17/2017 1507       Impression and Plan: Janice Martin is a 59 year old white female. She has a history of relapsed chronic immune thrombocytopenia.   I looked at her blood under the microscope.  She had some Howell-Jolly bodies with some of the red cells.  I do not see any evidence that she has developed a new spleen.  Her platelets were quite rare.  The rare platelet that I saw was large and well granulated.  We need to get her back on Nplate weekly now.  We really need to get her platelet count up quickly.  She gets on her Decadron taper.  She usually tapers Decadron over about 10 days.  This clearly is a little more complicated than I had thought.  I do not expect her platelet count to be so low.  I spent about 30 minutes with her today.  We will continue to do Nplate weekly and do labs weekly.  I will see her back in 3 weeks.  [name], MD 4/21/20218:13 AM

## 2020-02-08 NOTE — Patient Instructions (Signed)
Take Decadron (4 mg tablets) five tablets times four days.          Decadron (4 mg tablets) three tablets times two days.          Decadron (4 mg tablets) one tablet times two days.

## 2020-02-08 NOTE — Telephone Encounter (Signed)
Appointments scheduled   Patient has My Chart Access per 4/21 los

## 2020-02-15 ENCOUNTER — Inpatient Hospital Stay: Payer: 59

## 2020-02-15 ENCOUNTER — Other Ambulatory Visit: Payer: Self-pay

## 2020-02-15 DIAGNOSIS — D693 Immune thrombocytopenic purpura: Secondary | ICD-10-CM

## 2020-02-15 DIAGNOSIS — Z7901 Long term (current) use of anticoagulants: Secondary | ICD-10-CM | POA: Diagnosis not present

## 2020-02-15 DIAGNOSIS — Z86711 Personal history of pulmonary embolism: Secondary | ICD-10-CM | POA: Diagnosis not present

## 2020-02-15 LAB — CBC WITH DIFFERENTIAL (CANCER CENTER ONLY)
Abs Immature Granulocytes: 0.51 10*3/uL — ABNORMAL HIGH (ref 0.00–0.07)
Basophils Absolute: 0.1 10*3/uL (ref 0.0–0.1)
Basophils Relative: 1 %
Eosinophils Absolute: 0 10*3/uL (ref 0.0–0.5)
Eosinophils Relative: 0 %
HCT: 41.1 % (ref 36.0–46.0)
Hemoglobin: 14.2 g/dL (ref 12.0–15.0)
Immature Granulocytes: 2 %
Lymphocytes Relative: 9 %
Lymphs Abs: 2.2 10*3/uL (ref 0.7–4.0)
MCH: 30.6 pg (ref 26.0–34.0)
MCHC: 34.5 g/dL (ref 30.0–36.0)
MCV: 88.6 fL (ref 80.0–100.0)
Monocytes Absolute: 1.3 10*3/uL — ABNORMAL HIGH (ref 0.1–1.0)
Monocytes Relative: 5 %
Neutro Abs: 19.4 10*3/uL — ABNORMAL HIGH (ref 1.7–7.7)
Neutrophils Relative %: 83 %
Platelet Count: 404 10*3/uL — ABNORMAL HIGH (ref 150–400)
RBC: 4.64 MIL/uL (ref 3.87–5.11)
RDW: 14.5 % (ref 11.5–15.5)
WBC Count: 23.5 10*3/uL — ABNORMAL HIGH (ref 4.0–10.5)
nRBC: 0.3 % — ABNORMAL HIGH (ref 0.0–0.2)

## 2020-02-15 LAB — CMP (CANCER CENTER ONLY)
ALT: 24 U/L (ref 0–44)
AST: 13 U/L — ABNORMAL LOW (ref 15–41)
Albumin: 3.4 g/dL — ABNORMAL LOW (ref 3.5–5.0)
Alkaline Phosphatase: 117 U/L (ref 38–126)
Anion gap: 12 (ref 5–15)
BUN: 33 mg/dL — ABNORMAL HIGH (ref 6–20)
CO2: 25 mmol/L (ref 22–32)
Calcium: 8.3 mg/dL — ABNORMAL LOW (ref 8.9–10.3)
Chloride: 100 mmol/L (ref 98–111)
Creatinine: 1.16 mg/dL — ABNORMAL HIGH (ref 0.44–1.00)
GFR, Est AFR Am: >60 mL/min (ref >60)
GFR, Estimated: 52 mL/min — ABNORMAL LOW (ref >60)
Glucose, Bld: 113 mg/dL — ABNORMAL HIGH (ref 70–99)
Potassium: 4.1 mmol/L (ref 3.5–5.1)
Sodium: 137 mmol/L (ref 135–145)
Total Bilirubin: 0.3 mg/dL (ref 0.3–1.2)
Total Protein: 6.5 g/dL (ref 6.5–8.1)

## 2020-02-15 LAB — SAVE SMEAR(SSMR), FOR PROVIDER SLIDE REVIEW

## 2020-02-15 LAB — PLATELET BY CITRATE

## 2020-02-15 NOTE — Progress Notes (Signed)
Patients platelet count is 404 today. Hold injection per Dr. Marin Olp at this time.

## 2020-02-21 NOTE — Progress Notes (Signed)
Pharmacist Chemotherapy Monitoring - Follow Up Assessment    I verify that I have reviewed each item in the below checklist:  . Regimen for the patient is scheduled for the appropriate day and plan matches scheduled date. Marland Kitchen Appropriate non-routine labs are ordered dependent on drug ordered. . If applicable, additional medications reviewed and ordered per protocol based on lifetime cumulative doses and/or treatment regimen.   Plan for follow-up and/or issues identified: No . I-vent associated with next due treatment: No . MD and/or nursing notified: No  Janice Martin, Janice Martin 02/21/2020 7:59 AM

## 2020-02-22 ENCOUNTER — Other Ambulatory Visit: Payer: Self-pay

## 2020-02-22 ENCOUNTER — Telehealth: Payer: Self-pay | Admitting: *Deleted

## 2020-02-22 ENCOUNTER — Inpatient Hospital Stay: Payer: 59

## 2020-02-22 ENCOUNTER — Inpatient Hospital Stay: Payer: 59 | Attending: Hematology & Oncology

## 2020-02-22 DIAGNOSIS — D693 Immune thrombocytopenic purpura: Secondary | ICD-10-CM | POA: Insufficient documentation

## 2020-02-22 DIAGNOSIS — Z7901 Long term (current) use of anticoagulants: Secondary | ICD-10-CM | POA: Insufficient documentation

## 2020-02-22 DIAGNOSIS — I2699 Other pulmonary embolism without acute cor pulmonale: Secondary | ICD-10-CM | POA: Insufficient documentation

## 2020-02-22 LAB — CBC WITH DIFFERENTIAL (CANCER CENTER ONLY)
Abs Immature Granulocytes: 0.1 10*3/uL — ABNORMAL HIGH (ref 0.00–0.07)
Basophils Absolute: 0.1 10*3/uL (ref 0.0–0.1)
Basophils Relative: 1 %
Eosinophils Absolute: 0.1 10*3/uL (ref 0.0–0.5)
Eosinophils Relative: 1 %
HCT: 36.7 % (ref 36.0–46.0)
Hemoglobin: 12.3 g/dL (ref 12.0–15.0)
Immature Granulocytes: 1 %
Lymphocytes Relative: 27 %
Lymphs Abs: 4 10*3/uL (ref 0.7–4.0)
MCH: 31 pg (ref 26.0–34.0)
MCHC: 33.5 g/dL (ref 30.0–36.0)
MCV: 92.4 fL (ref 80.0–100.0)
Monocytes Absolute: 1.5 10*3/uL — ABNORMAL HIGH (ref 0.1–1.0)
Monocytes Relative: 10 %
Neutro Abs: 9.1 10*3/uL — ABNORMAL HIGH (ref 1.7–7.7)
Neutrophils Relative %: 60 %
Platelet Count: 966 10*3/uL (ref 150–400)
RBC: 3.97 MIL/uL (ref 3.87–5.11)
RDW: 15.3 % (ref 11.5–15.5)
WBC Count: 14.9 10*3/uL — ABNORMAL HIGH (ref 4.0–10.5)
nRBC: 0 % (ref 0.0–0.2)

## 2020-02-22 NOTE — Telephone Encounter (Signed)
Received phone from [facility] lab letting us know that Platelets are 966,000.  Dr Marin Olp notified. No orders received at this time.

## 2020-02-22 NOTE — Progress Notes (Signed)
Patient MD made aware of critical labs. Spoke with Tammi, RN who spoke with MD and was told to hold injection and have patient follow up with MD.

## 2020-02-28 ENCOUNTER — Telehealth: Payer: Self-pay | Admitting: Hematology & Oncology

## 2020-02-28 ENCOUNTER — Inpatient Hospital Stay: Payer: 59

## 2020-02-28 ENCOUNTER — Other Ambulatory Visit: Payer: Self-pay

## 2020-02-28 ENCOUNTER — Encounter: Payer: Self-pay | Admitting: Hematology & Oncology

## 2020-02-28 ENCOUNTER — Inpatient Hospital Stay (HOSPITAL_BASED_OUTPATIENT_CLINIC_OR_DEPARTMENT_OTHER): Payer: 59 | Admitting: Hematology & Oncology

## 2020-02-28 VITALS — BP 149/76 | HR 79 | Temp 96.8°F | Resp 18 | Wt 190.0 lb

## 2020-02-28 DIAGNOSIS — D693 Immune thrombocytopenic purpura: Secondary | ICD-10-CM

## 2020-02-28 DIAGNOSIS — I2699 Other pulmonary embolism without acute cor pulmonale: Secondary | ICD-10-CM | POA: Diagnosis not present

## 2020-02-28 DIAGNOSIS — Z7901 Long term (current) use of anticoagulants: Secondary | ICD-10-CM | POA: Diagnosis not present

## 2020-02-28 LAB — CBC WITH DIFFERENTIAL (CANCER CENTER ONLY)
Abs Immature Granulocytes: 0.05 10*3/uL (ref 0.00–0.07)
Basophils Absolute: 0.2 10*3/uL — ABNORMAL HIGH (ref 0.0–0.1)
Basophils Relative: 2 %
Eosinophils Absolute: 0 10*3/uL (ref 0.0–0.5)
Eosinophils Relative: 0 %
HCT: 38.5 % (ref 36.0–46.0)
Hemoglobin: 13 g/dL (ref 12.0–15.0)
Immature Granulocytes: 1 %
Lymphocytes Relative: 30 %
Lymphs Abs: 3.1 10*3/uL (ref 0.7–4.0)
MCH: 31.4 pg (ref 26.0–34.0)
MCHC: 33.8 g/dL (ref 30.0–36.0)
MCV: 93 fL (ref 80.0–100.0)
Monocytes Absolute: 1 10*3/uL (ref 0.1–1.0)
Monocytes Relative: 10 %
Neutro Abs: 6 10*3/uL (ref 1.7–7.7)
Neutrophils Relative %: 57 %
Platelet Count: 728 10*3/uL — ABNORMAL HIGH (ref 150–400)
RBC: 4.14 MIL/uL (ref 3.87–5.11)
RDW: 14.5 % (ref 11.5–15.5)
WBC Count: 10.3 10*3/uL (ref 4.0–10.5)
nRBC: 0 % (ref 0.0–0.2)

## 2020-02-28 NOTE — Progress Notes (Signed)
Hematology and Oncology Follow Up Visit  Janice Martin [id number] 1961/09/22 59 y.o. 02/28/2020   Principle Diagnosis:   Chronic immune thrombocytopenia-recurrent  Submassive pulmonary embolism, with right lower extremity thrombus  Current Therapy:    Xarelto 10 mg by mouth daily   Decadron-taper per protocol for relapsed thrombocytopenia  Nplate q 3 weeks for platelet count < 300K -- change on 12/28/2019     Interim History:  [name] is back for followup.  She has had a very nice response to the Nplate/Decadron combination.  Her platelet count actually went up to 900,000.  She had a very nice Mother's Day weekend.  She and her husband were just with him cells.  She is still working at [employer] at [employer].  She is happy that they are now getting get some help.  She has had no problems with bleeding.  She is on Xarelto.  She has had no change in bowel or bladder habits.  There is no fever.  There is no rashes.  She has had no leg swelling.    She has had no cough or shortness of breath.  There is been no headaches.  Overall, her performance status is ECOG 1.   Medications:  Current Outpatient Medications:  .  nebivolol (BYSTOLIC) 5 MG tablet, Take 1 tablet (5 mg total) by mouth daily., Disp: 90 tablet, Rfl: 1 .  valsartan-hydrochlorothiazide (DIOVAN-HCT) 320-25 MG tablet, Take 1 tablet by mouth daily., Disp: 90 tablet, Rfl: 1 .  vitamin B-12 (CYANOCOBALAMIN) 100 MCG tablet, Take 100 mcg by mouth at bedtime. , Disp: , Rfl:  .  XARELTO 10 MG TABS tablet, TAKE 1 TABLET BY MOUTH ONCE DAILY WITH SUPPER, Disp: 90 tablet, Rfl: 3  Allergies:  Allergies  Allergen Reactions  . Bee Venom Anaphylaxis, Shortness Of Breath and Swelling  . Influenza Vaccines Other (See Comments)    Per pt can't take mess up with her immune system  . Morphine Other (See Comments)    Burns really bad  . Shellfish Allergy Anaphylaxis    Past Medical History, Surgical  history, Social history, and Family History were reviewed and updated.  Review of Systems: Review of Systems  Constitutional: Negative.   HENT: Negative.   Eyes: Negative.   Respiratory: Negative.   Cardiovascular: Negative.   Gastrointestinal: Negative.   Genitourinary: Negative.   Musculoskeletal: Negative.   Skin: Negative.   Neurological: Negative.   Endo/Heme/Allergies: Negative.   Psychiatric/Behavioral: Negative.     Physical Exam:  weight is 190 lb (86.2 kg). Her temporal temperature is 96.8 F (36 C) (abnormal). Her blood pressure is 149/76 (abnormal) and her pulse is 79. Her respiration is 18 and oxygen saturation is 100%.   Physical Exam Vitals reviewed.  HENT:     Head: Normocephalic and atraumatic.  Eyes:     Pupils: Pupils are equal, round, and reactive to light.  Cardiovascular:     Rate and Rhythm: Normal rate and regular rhythm.     Heart sounds: Normal heart sounds.  Pulmonary:     Effort: Pulmonary effort is normal.     Breath sounds: Normal breath sounds.  Abdominal:     General: Bowel sounds are normal.     Palpations: Abdomen is soft.     Comments: She has well-healed splenectomy scar.  No fluid wave.  No palpable liver.  No guarding or rebound tenderness.  Musculoskeletal:        General: No tenderness or deformity.  Normal range of motion.     Cervical back: Normal range of motion.  Lymphadenopathy:     Cervical: No cervical adenopathy.  Skin:    General: Skin is warm and dry.     Findings: No erythema or rash.  Neurological:     Mental Status: She is alert and oriented to person, place, and time.  Psychiatric:        Behavior: Behavior normal.        Thought Content: Thought content normal.        Judgment: Judgment normal.     Lab Results  Component Value Date   WBC 10.3 02/28/2020   HGB 13.0 02/28/2020   HCT 38.5 02/28/2020   MCV 93.0 02/28/2020   PLT 728 (H) 02/28/2020     Chemistry      Component Value Date/Time   NA 137  02/15/2020 0750   NA 140 09/17/2017 1507   K 4.1 02/15/2020 0750   K 3.9 09/17/2017 1507   CL 100 02/15/2020 0750   CL 99 04/17/2017 1411   CO2 25 02/15/2020 0750   CO2 30 (H) 09/17/2017 1507   BUN 33 (H) 02/15/2020 0750   BUN 8.9 09/17/2017 1507   CREATININE 1.16 (H) 02/15/2020 0750   CREATININE 1.2 (H) 09/17/2017 1507      Component Value Date/Time   CALCIUM 8.3 (L) 02/15/2020 0750   CALCIUM 9.3 09/17/2017 1507   ALKPHOS 117 02/15/2020 0750   ALKPHOS 105 09/17/2017 1507   AST 13 (L) 02/15/2020 0750   AST 15 09/17/2017 1507   ALT 24 02/15/2020 0750   ALT 18 09/17/2017 1507   BILITOT 0.3 02/15/2020 0750   BILITOT 0.38 09/17/2017 1507       Impression and Plan: Janice Martin is a 59 year old white female. She has a history of relapsed chronic immune thrombocytopenia.   I looked at her blood under the microscope.  She had some Howell-Jolly bodies with some of the red cells.  She has adequate platelets now.  Platelets are somewhat large and well granulated.  We will hold the Nplate for right now.  Again she responds very nice to the Decadron/Nplate combination.  I think we can move her labs to every 2 weeks now.  I will plan to see her back in 6 weeks.    [name], MD 5/11/20218:22 AM

## 2020-02-28 NOTE — Telephone Encounter (Signed)
Appointments scheduled calendar declined patient has My Chart Access per 5/11 los

## 2020-03-07 MED FILL — VALSARTAN-HCTZ 320-25 MG TA: 320-25 | 30 days supply | Qty: 30 | Fill #5

## 2020-03-12 MED FILL — XARELTO 10 MG TABLET: 10 | 90 days supply | Qty: 90 | Fill #1

## 2020-03-13 ENCOUNTER — Other Ambulatory Visit: Payer: 59

## 2020-03-14 ENCOUNTER — Other Ambulatory Visit: Payer: Self-pay

## 2020-03-14 ENCOUNTER — Inpatient Hospital Stay: Payer: 59

## 2020-03-14 DIAGNOSIS — Z7901 Long term (current) use of anticoagulants: Secondary | ICD-10-CM | POA: Diagnosis not present

## 2020-03-14 DIAGNOSIS — I2699 Other pulmonary embolism without acute cor pulmonale: Secondary | ICD-10-CM | POA: Diagnosis not present

## 2020-03-14 DIAGNOSIS — D693 Immune thrombocytopenic purpura: Secondary | ICD-10-CM

## 2020-03-14 LAB — CBC WITH DIFFERENTIAL (CANCER CENTER ONLY)
Abs Immature Granulocytes: 0.08 10*3/uL — ABNORMAL HIGH (ref 0.00–0.07)
Basophils Absolute: 0.2 10*3/uL — ABNORMAL HIGH (ref 0.0–0.1)
Basophils Relative: 1 %
Eosinophils Absolute: 1.1 10*3/uL — ABNORMAL HIGH (ref 0.0–0.5)
Eosinophils Relative: 9 %
HCT: 40.2 % (ref 36.0–46.0)
Hemoglobin: 13.5 g/dL (ref 12.0–15.0)
Immature Granulocytes: 1 %
Lymphocytes Relative: 32 %
Lymphs Abs: 3.9 10*3/uL (ref 0.7–4.0)
MCH: 31.5 pg (ref 26.0–34.0)
MCHC: 33.6 g/dL (ref 30.0–36.0)
MCV: 93.7 fL (ref 80.0–100.0)
Monocytes Absolute: 1.3 10*3/uL — ABNORMAL HIGH (ref 0.1–1.0)
Monocytes Relative: 10 %
Neutro Abs: 5.9 10*3/uL (ref 1.7–7.7)
Neutrophils Relative %: 47 %
Platelet Count: 281 10*3/uL (ref 150–400)
RBC: 4.29 MIL/uL (ref 3.87–5.11)
RDW: 14.3 % (ref 11.5–15.5)
WBC Count: 12.5 10*3/uL — ABNORMAL HIGH (ref 4.0–10.5)
nRBC: 0 % (ref 0.0–0.2)

## 2020-03-14 LAB — CMP (CANCER CENTER ONLY)
ALT: 15 U/L (ref 0–44)
AST: 11 U/L — ABNORMAL LOW (ref 15–41)
Albumin: 3.7 g/dL (ref 3.5–5.0)
Alkaline Phosphatase: 123 U/L (ref 38–126)
Anion gap: 11 (ref 5–15)
BUN: 12 mg/dL (ref 6–20)
CO2: 29 mmol/L (ref 22–32)
Calcium: 9.5 mg/dL (ref 8.9–10.3)
Chloride: 99 mmol/L (ref 98–111)
Creatinine: 1.13 mg/dL — ABNORMAL HIGH (ref 0.44–1.00)
GFR, Est AFR Am: >60 mL/min (ref >60)
GFR, Estimated: 54 mL/min — ABNORMAL LOW (ref >60)
Glucose, Bld: 105 mg/dL — ABNORMAL HIGH (ref 70–99)
Potassium: 4 mmol/L (ref 3.5–5.1)
Sodium: 139 mmol/L (ref 135–145)
Total Bilirubin: 0.4 mg/dL (ref 0.3–1.2)
Total Protein: 7.3 g/dL (ref 6.5–8.1)

## 2020-03-14 LAB — SAVE SMEAR(SSMR), FOR PROVIDER SLIDE REVIEW

## 2020-03-22 ENCOUNTER — Other Ambulatory Visit: Payer: Self-pay | Admitting: *Deleted

## 2020-03-22 ENCOUNTER — Inpatient Hospital Stay: Payer: 59

## 2020-03-22 ENCOUNTER — Other Ambulatory Visit: Payer: Self-pay

## 2020-03-22 ENCOUNTER — Inpatient Hospital Stay: Payer: 59 | Attending: Hematology & Oncology

## 2020-03-22 DIAGNOSIS — Z7901 Long term (current) use of anticoagulants: Secondary | ICD-10-CM | POA: Diagnosis not present

## 2020-03-22 DIAGNOSIS — I825Z1 Chronic embolism and thrombosis of unspecified deep veins of right distal lower extremity: Secondary | ICD-10-CM

## 2020-03-22 DIAGNOSIS — I2699 Other pulmonary embolism without acute cor pulmonale: Secondary | ICD-10-CM | POA: Diagnosis not present

## 2020-03-22 DIAGNOSIS — D693 Immune thrombocytopenic purpura: Secondary | ICD-10-CM

## 2020-03-22 LAB — CBC WITH DIFFERENTIAL (CANCER CENTER ONLY)
Abs Immature Granulocytes: 0.06 10*3/uL (ref 0.00–0.07)
Basophils Absolute: 0.2 10*3/uL — ABNORMAL HIGH (ref 0.0–0.1)
Basophils Relative: 1 %
Eosinophils Absolute: 0 10*3/uL (ref 0.0–0.5)
Eosinophils Relative: 0 %
HCT: 38 % (ref 36.0–46.0)
Hemoglobin: 12.9 g/dL (ref 12.0–15.0)
Immature Granulocytes: 1 %
Lymphocytes Relative: 30 %
Lymphs Abs: 3.4 10*3/uL (ref 0.7–4.0)
MCH: 31.5 pg (ref 26.0–34.0)
MCHC: 33.9 g/dL (ref 30.0–36.0)
MCV: 92.9 fL (ref 80.0–100.0)
Monocytes Absolute: 1 10*3/uL (ref 0.1–1.0)
Monocytes Relative: 9 %
Neutro Abs: 6.6 10*3/uL (ref 1.7–7.7)
Neutrophils Relative %: 59 %
Platelet Count: 357 10*3/uL (ref 150–400)
RBC: 4.09 MIL/uL (ref 3.87–5.11)
RDW: 13.7 % (ref 11.5–15.5)
WBC Count: 11.2 10*3/uL — ABNORMAL HIGH (ref 4.0–10.5)
nRBC: 0 % (ref 0.0–0.2)

## 2020-03-22 NOTE — Progress Notes (Signed)
Hold nplate today per Dr Marin Olp

## 2020-03-27 ENCOUNTER — Other Ambulatory Visit: Payer: Self-pay | Admitting: Hematology & Oncology

## 2020-03-27 DIAGNOSIS — D693 Immune thrombocytopenic purpura: Secondary | ICD-10-CM

## 2020-03-28 ENCOUNTER — Other Ambulatory Visit: Payer: Self-pay

## 2020-03-28 ENCOUNTER — Inpatient Hospital Stay: Payer: 59

## 2020-03-28 DIAGNOSIS — D693 Immune thrombocytopenic purpura: Secondary | ICD-10-CM

## 2020-03-28 DIAGNOSIS — I2699 Other pulmonary embolism without acute cor pulmonale: Secondary | ICD-10-CM | POA: Diagnosis not present

## 2020-03-28 DIAGNOSIS — Z7901 Long term (current) use of anticoagulants: Secondary | ICD-10-CM | POA: Diagnosis not present

## 2020-03-28 LAB — CBC WITH DIFFERENTIAL (CANCER CENTER ONLY)
Abs Immature Granulocytes: 0.03 10*3/uL (ref 0.00–0.07)
Basophils Absolute: 0.2 10*3/uL — ABNORMAL HIGH (ref 0.0–0.1)
Basophils Relative: 2 %
Eosinophils Absolute: 0.1 10*3/uL (ref 0.0–0.5)
Eosinophils Relative: 1 %
HCT: 38.9 % (ref 36.0–46.0)
Hemoglobin: 13 g/dL (ref 12.0–15.0)
Immature Granulocytes: 0 %
Lymphocytes Relative: 34 %
Lymphs Abs: 3.5 10*3/uL (ref 0.7–4.0)
MCH: 31.6 pg (ref 26.0–34.0)
MCHC: 33.4 g/dL (ref 30.0–36.0)
MCV: 94.4 fL (ref 80.0–100.0)
Monocytes Absolute: 0.9 10*3/uL (ref 0.1–1.0)
Monocytes Relative: 9 %
Neutro Abs: 5.5 10*3/uL (ref 1.7–7.7)
Neutrophils Relative %: 54 %
Platelet Count: 401 10*3/uL — ABNORMAL HIGH (ref 150–400)
RBC: 4.12 MIL/uL (ref 3.87–5.11)
RDW: 14.2 % (ref 11.5–15.5)
WBC Count: 10.1 10*3/uL (ref 4.0–10.5)
nRBC: 0 % (ref 0.0–0.2)

## 2020-04-10 ENCOUNTER — Other Ambulatory Visit: Payer: Self-pay | Admitting: *Deleted

## 2020-04-10 DIAGNOSIS — D693 Immune thrombocytopenic purpura: Secondary | ICD-10-CM

## 2020-04-10 DIAGNOSIS — I825Z1 Chronic embolism and thrombosis of unspecified deep veins of right distal lower extremity: Secondary | ICD-10-CM

## 2020-04-11 ENCOUNTER — Other Ambulatory Visit: Payer: Self-pay

## 2020-04-11 ENCOUNTER — Inpatient Hospital Stay (HOSPITAL_BASED_OUTPATIENT_CLINIC_OR_DEPARTMENT_OTHER): Payer: 59 | Admitting: Hematology & Oncology

## 2020-04-11 ENCOUNTER — Encounter: Payer: Self-pay | Admitting: Hematology & Oncology

## 2020-04-11 ENCOUNTER — Telehealth: Payer: Self-pay | Admitting: Hematology & Oncology

## 2020-04-11 ENCOUNTER — Inpatient Hospital Stay: Payer: 59

## 2020-04-11 VITALS — BP 148/73 | HR 79 | Temp 98.5°F | Resp 20 | Wt 188.4 lb

## 2020-04-11 DIAGNOSIS — D693 Immune thrombocytopenic purpura: Secondary | ICD-10-CM | POA: Diagnosis not present

## 2020-04-11 DIAGNOSIS — I2699 Other pulmonary embolism without acute cor pulmonale: Secondary | ICD-10-CM | POA: Diagnosis not present

## 2020-04-11 DIAGNOSIS — Z7901 Long term (current) use of anticoagulants: Secondary | ICD-10-CM | POA: Diagnosis not present

## 2020-04-11 DIAGNOSIS — I825Z1 Chronic embolism and thrombosis of unspecified deep veins of right distal lower extremity: Secondary | ICD-10-CM

## 2020-04-11 LAB — CBC WITH DIFFERENTIAL (CANCER CENTER ONLY)
Abs Immature Granulocytes: 0.05 10*3/uL (ref 0.00–0.07)
Basophils Absolute: 0.2 10*3/uL — ABNORMAL HIGH (ref 0.0–0.1)
Basophils Relative: 2 %
Eosinophils Absolute: 0 10*3/uL (ref 0.0–0.5)
Eosinophils Relative: 0 %
HCT: 40.1 % (ref 36.0–46.0)
Hemoglobin: 13.6 g/dL (ref 12.0–15.0)
Immature Granulocytes: 0 %
Lymphocytes Relative: 30 %
Lymphs Abs: 3.6 10*3/uL (ref 0.7–4.0)
MCH: 31.9 pg (ref 26.0–34.0)
MCHC: 33.9 g/dL (ref 30.0–36.0)
MCV: 94.1 fL (ref 80.0–100.0)
Monocytes Absolute: 0.9 10*3/uL (ref 0.1–1.0)
Monocytes Relative: 8 %
Neutro Abs: 7.3 10*3/uL (ref 1.7–7.7)
Neutrophils Relative %: 60 %
Platelet Count: 259 10*3/uL (ref 150–400)
RBC: 4.26 MIL/uL (ref 3.87–5.11)
RDW: 13.8 % (ref 11.5–15.5)
WBC Count: 12.2 10*3/uL — ABNORMAL HIGH (ref 4.0–10.5)
nRBC: 0 % (ref 0.0–0.2)

## 2020-04-11 LAB — CMP (CANCER CENTER ONLY)
ALT: 16 U/L (ref 0–44)
AST: 13 U/L — ABNORMAL LOW (ref 15–41)
Albumin: 4.3 g/dL (ref 3.5–5.0)
Alkaline Phosphatase: 104 U/L (ref 38–126)
Anion gap: 8 (ref 5–15)
BUN: 9 mg/dL (ref 6–20)
CO2: 32 mmol/L (ref 22–32)
Calcium: 10 mg/dL (ref 8.9–10.3)
Chloride: 100 mmol/L (ref 98–111)
Creatinine: 1.05 mg/dL — ABNORMAL HIGH (ref 0.44–1.00)
GFR, Est AFR Am: >60 mL/min (ref >60)
GFR, Estimated: 59 mL/min — ABNORMAL LOW (ref >60)
Glucose, Bld: 113 mg/dL — ABNORMAL HIGH (ref 70–99)
Potassium: 3.9 mmol/L (ref 3.5–5.1)
Sodium: 140 mmol/L (ref 135–145)
Total Bilirubin: 0.2 mg/dL — ABNORMAL LOW (ref 0.3–1.2)
Total Protein: 7.3 g/dL (ref 6.5–8.1)

## 2020-04-11 LAB — SAVE SMEAR(SSMR), FOR PROVIDER SLIDE REVIEW

## 2020-04-11 NOTE — Progress Notes (Signed)
Hematology and Oncology Follow Up Visit  Janice Martin [id number] 1961-07-02 59 y.o. 04/11/2020   Principle Diagnosis:   Chronic immune thrombocytopenia-recurrent  Submassive pulmonary embolism, with right lower extremity thrombus  Current Therapy:    Xarelto 10 mg by mouth daily   Decadron-taper per protocol for relapsed thrombocytopenia  Nplate q 3 weeks for platelet count < 300K -- change on 12/28/2019     Interim History:  [name] is back for followup.  As expected, she is doing quite well with the Nplate.  I do think she has had and Nplate now probably for good 6 weeks or so.  Whenever we give her Nplate, her platelet count goes up quite a bit.  She is still working over at [employer].  They now have little more help which makes life easier for her.  She has had no problems with bleeding.  Is been no issues with cough or shortness of breath.  She has had no chest wall pain.  She has had no change in bowel or bladder habits.  She has had no leg swelling.  There has been no headaches.  Overall, her performance status is ECOG 1.   Medications:  Current Outpatient Medications:    nebivolol (BYSTOLIC) 5 MG tablet, Take 1 tablet (5 mg total) by mouth daily., Disp: 90 tablet, Rfl: 1   valsartan-hydrochlorothiazide (DIOVAN-HCT) 320-25 MG tablet, Take 1 tablet by mouth daily., Disp: 90 tablet, Rfl: 1   vitamin B-12 (CYANOCOBALAMIN) 100 MCG tablet, Take 100 mcg by mouth at bedtime. , Disp: , Rfl:    XARELTO 10 MG TABS tablet, TAKE 1 TABLET BY MOUTH ONCE DAILY WITH SUPPER, Disp: 90 tablet, Rfl: 3  Allergies:  Allergies  Allergen Reactions   Bee Venom Anaphylaxis, Shortness Of Breath and Swelling   Influenza Vaccines Other (See Comments)    Per pt can't take mess up with her immune system   Morphine Other (See Comments)    Burns really bad   Shellfish Allergy Anaphylaxis    Past Medical History, Surgical history, Social history, and Family History were  reviewed and updated.  Review of Systems: Review of Systems  Constitutional: Negative.   HENT: Negative.   Eyes: Negative.   Respiratory: Negative.   Cardiovascular: Negative.   Gastrointestinal: Negative.   Genitourinary: Negative.   Musculoskeletal: Negative.   Skin: Negative.   Neurological: Negative.   Endo/Heme/Allergies: Negative.   Psychiatric/Behavioral: Negative.     Physical Exam:  weight is 188 lb 6.4 oz (85.5 kg). Her oral temperature is 98.5 F (36.9 C). Her blood pressure is 148/73 (abnormal) and her pulse is 79. Her respiration is 20 and oxygen saturation is 99%.   Physical Exam Vitals reviewed.  HENT:     Head: Normocephalic and atraumatic.  Eyes:     Pupils: Pupils are equal, round, and reactive to light.  Cardiovascular:     Rate and Rhythm: Normal rate and regular rhythm.     Heart sounds: Normal heart sounds.  Pulmonary:     Effort: Pulmonary effort is normal.     Breath sounds: Normal breath sounds.  Abdominal:     General: Bowel sounds are normal.     Palpations: Abdomen is soft.     Comments: She has well-healed splenectomy scar.  No fluid wave.  No palpable liver.  No guarding or rebound tenderness.  Musculoskeletal:        General: No tenderness or deformity. Normal range of motion.  Cervical back: Normal range of motion.  Lymphadenopathy:     Cervical: No cervical adenopathy.  Skin:    General: Skin is warm and dry.     Findings: No erythema or rash.  Neurological:     Mental Status: She is alert and oriented to person, place, and time.  Psychiatric:        Behavior: Behavior normal.        Thought Content: Thought content normal.        Judgment: Judgment normal.     Lab Results  Component Value Date   WBC 12.2 (H) 04/11/2020   HGB 13.6 04/11/2020   HCT 40.1 04/11/2020   MCV 94.1 04/11/2020   PLT 259 04/11/2020     Chemistry      Component Value Date/Time   NA 139 03/14/2020 0745   NA 140 09/17/2017 1507   K 4.0  03/14/2020 0745   K 3.9 09/17/2017 1507   CL 99 03/14/2020 0745   CL 99 04/17/2017 1411   CO2 29 03/14/2020 0745   CO2 30 (H) 09/17/2017 1507   BUN 12 03/14/2020 0745   BUN 8.9 09/17/2017 1507   CREATININE 1.13 (H) 03/14/2020 0745   CREATININE 1.2 (H) 09/17/2017 1507      Component Value Date/Time   CALCIUM 9.5 03/14/2020 0745   CALCIUM 9.3 09/17/2017 1507   ALKPHOS 123 03/14/2020 0745   ALKPHOS 105 09/17/2017 1507   AST 11 (L) 03/14/2020 0745   AST 15 09/17/2017 1507   ALT 15 03/14/2020 0745   ALT 18 09/17/2017 1507   BILITOT 0.4 03/14/2020 0745   BILITOT 0.38 09/17/2017 1507       Impression and Plan: Janice Martin is a 59 year old white female. She has a history of relapsed chronic immune thrombocytopenia.   I looked at her blood under the microscope.  She had some Howell-Jolly bodies with some of the red cells.  She has adequate platelets now.  Platelets are somewhat large and well granulated.  We will still plan on holding the Nplate.  I think we have a lot of "flexibility" when we have to give it.  She and her family will be going on vacation in early July.  We will make sure she comes back and have blood work checked a couple days before she goes.  We will move her lab work to every 3 weeks.  I will plan to see her back in 6 weeks.    [name], MD 6/23/20218:20 AM

## 2020-04-11 NOTE — Telephone Encounter (Signed)
Appointments scheduled patient will get updates from My Chart per 6/23 los

## 2020-04-19 ENCOUNTER — Other Ambulatory Visit: Payer: Self-pay | Admitting: Internal Medicine

## 2020-04-20 ENCOUNTER — Telehealth: Payer: Self-pay | Admitting: Internal Medicine

## 2020-04-20 ENCOUNTER — Other Ambulatory Visit: Payer: Self-pay

## 2020-04-20 ENCOUNTER — Other Ambulatory Visit: Payer: Self-pay | Admitting: Internal Medicine

## 2020-04-20 MED ORDER — NEBIVOLOL HCL 5 MG PO TABS: 5.0000 mg | ORAL_TABLET | Freq: Every day | ORAL | 0 refills | Status: DC

## 2020-04-20 MED FILL — BYSTOLIC 5 MG TABLET: 5 | 30 days supply | Qty: 30 | Fill #0

## 2020-04-20 NOTE — Telephone Encounter (Signed)
New message:   Pharmacy is calling and would like to know if the pt is supposed to be getting a refill of nebivolol (BYSTOLIC) 5 MG tablet or the valsartan. She states the pt hasn't had Bystolic in over a year. She states if this is supposed to be filled can we please resend it due to the system deleting the order. Please advise.

## 2020-04-20 NOTE — Telephone Encounter (Signed)
Dr Quay Burow approved refill yesterday with 30 day supply only. She needs return office visit for any future refills. Rx resent in.

## 2020-04-21 ENCOUNTER — Other Ambulatory Visit: Payer: Self-pay | Admitting: Internal Medicine

## 2020-04-24 ENCOUNTER — Encounter: Payer: Self-pay | Admitting: Internal Medicine

## 2020-04-24 MED FILL — VALSARTAN-HCTZ 320-25 MG TA: 320-25 | 30 days supply | Qty: 30 | Fill #0

## 2020-04-25 ENCOUNTER — Inpatient Hospital Stay: Payer: 59 | Attending: Hematology & Oncology

## 2020-04-25 ENCOUNTER — Other Ambulatory Visit: Payer: Self-pay

## 2020-04-25 DIAGNOSIS — D693 Immune thrombocytopenic purpura: Secondary | ICD-10-CM | POA: Insufficient documentation

## 2020-04-25 LAB — CBC WITH DIFFERENTIAL (CANCER CENTER ONLY)
Abs Immature Granulocytes: 0.04 10*3/uL (ref 0.00–0.07)
Basophils Absolute: 0.2 10*3/uL — ABNORMAL HIGH (ref 0.0–0.1)
Basophils Relative: 1 %
Eosinophils Absolute: 1.4 10*3/uL — ABNORMAL HIGH (ref 0.0–0.5)
Eosinophils Relative: 13 %
HCT: 40.1 % (ref 36.0–46.0)
Hemoglobin: 13.6 g/dL (ref 12.0–15.0)
Immature Granulocytes: 0 %
Lymphocytes Relative: 36 %
Lymphs Abs: 4 10*3/uL (ref 0.7–4.0)
MCH: 31.2 pg (ref 26.0–34.0)
MCHC: 33.9 g/dL (ref 30.0–36.0)
MCV: 92 fL (ref 80.0–100.0)
Monocytes Absolute: 1 10*3/uL (ref 0.1–1.0)
Monocytes Relative: 9 %
Neutro Abs: 4.3 10*3/uL (ref 1.7–7.7)
Neutrophils Relative %: 41 %
Platelet Count: 307 10*3/uL (ref 150–400)
RBC: 4.36 MIL/uL (ref 3.87–5.11)
RDW: 13.5 % (ref 11.5–15.5)
WBC Count: 10.9 10*3/uL — ABNORMAL HIGH (ref 4.0–10.5)
nRBC: 0 % (ref 0.0–0.2)

## 2020-04-25 MED ORDER — EPINEPHRINE 0.3 MG/0.3ML IJ SOAJ: 0.3000 mg | INTRAMUSCULAR | 2 refills | Status: DC | PRN

## 2020-04-25 MED FILL — EPINEPHRINE 0.3 MG AUTO-INJ: 0.3 | 30 days supply | Qty: 2 | Fill #0

## 2020-05-16 ENCOUNTER — Inpatient Hospital Stay: Payer: 59

## 2020-05-16 ENCOUNTER — Other Ambulatory Visit: Payer: Self-pay

## 2020-05-16 DIAGNOSIS — D693 Immune thrombocytopenic purpura: Secondary | ICD-10-CM | POA: Diagnosis not present

## 2020-05-16 LAB — CBC WITH DIFFERENTIAL (CANCER CENTER ONLY)
Abs Immature Granulocytes: 0.05 10*3/uL (ref 0.00–0.07)
Basophils Absolute: 0.2 10*3/uL — ABNORMAL HIGH (ref 0.0–0.1)
Basophils Relative: 1 %
Eosinophils Absolute: 0.2 10*3/uL (ref 0.0–0.5)
Eosinophils Relative: 2 %
HCT: 38.7 % (ref 36.0–46.0)
Hemoglobin: 13 g/dL (ref 12.0–15.0)
Immature Granulocytes: 0 %
Lymphocytes Relative: 34 %
Lymphs Abs: 3.8 10*3/uL (ref 0.7–4.0)
MCH: 31.6 pg (ref 26.0–34.0)
MCHC: 33.6 g/dL (ref 30.0–36.0)
MCV: 94.2 fL (ref 80.0–100.0)
Monocytes Absolute: 1.1 10*3/uL — ABNORMAL HIGH (ref 0.1–1.0)
Monocytes Relative: 10 %
Neutro Abs: 6 10*3/uL (ref 1.7–7.7)
Neutrophils Relative %: 53 %
Platelet Count: 262 10*3/uL (ref 150–400)
RBC: 4.11 MIL/uL (ref 3.87–5.11)
RDW: 13.8 % (ref 11.5–15.5)
WBC Count: 11.2 10*3/uL — ABNORMAL HIGH (ref 4.0–10.5)
nRBC: 0 % (ref 0.0–0.2)

## 2020-05-23 ENCOUNTER — Other Ambulatory Visit: Payer: Self-pay

## 2020-05-23 ENCOUNTER — Telehealth: Payer: Self-pay | Admitting: Hematology & Oncology

## 2020-05-23 ENCOUNTER — Inpatient Hospital Stay: Payer: 59 | Attending: Hematology & Oncology

## 2020-05-23 ENCOUNTER — Encounter: Payer: Self-pay | Admitting: Hematology & Oncology

## 2020-05-23 ENCOUNTER — Inpatient Hospital Stay (HOSPITAL_BASED_OUTPATIENT_CLINIC_OR_DEPARTMENT_OTHER): Payer: 59 | Admitting: Hematology & Oncology

## 2020-05-23 VITALS — BP 167/84 | HR 80 | Temp 98.3°F | Resp 18 | Wt 190.0 lb

## 2020-05-23 DIAGNOSIS — Z7901 Long term (current) use of anticoagulants: Secondary | ICD-10-CM | POA: Diagnosis not present

## 2020-05-23 DIAGNOSIS — D693 Immune thrombocytopenic purpura: Secondary | ICD-10-CM | POA: Insufficient documentation

## 2020-05-23 DIAGNOSIS — I2699 Other pulmonary embolism without acute cor pulmonale: Secondary | ICD-10-CM | POA: Insufficient documentation

## 2020-05-23 LAB — CBC WITH DIFFERENTIAL (CANCER CENTER ONLY)
Abs Immature Granulocytes: 0.06 10*3/uL (ref 0.00–0.07)
Basophils Absolute: 0.2 10*3/uL — ABNORMAL HIGH (ref 0.0–0.1)
Basophils Relative: 1 %
Eosinophils Absolute: 1.8 10*3/uL — ABNORMAL HIGH (ref 0.0–0.5)
Eosinophils Relative: 14 %
HCT: 40.1 % (ref 36.0–46.0)
Hemoglobin: 13.7 g/dL (ref 12.0–15.0)
Immature Granulocytes: 1 %
Lymphocytes Relative: 31 %
Lymphs Abs: 3.9 10*3/uL (ref 0.7–4.0)
MCH: 31.9 pg (ref 26.0–34.0)
MCHC: 34.2 g/dL (ref 30.0–36.0)
MCV: 93.5 fL (ref 80.0–100.0)
Monocytes Absolute: 1 10*3/uL (ref 0.1–1.0)
Monocytes Relative: 8 %
Neutro Abs: 5.4 10*3/uL (ref 1.7–7.7)
Neutrophils Relative %: 45 %
Platelet Count: 267 10*3/uL (ref 150–400)
RBC: 4.29 MIL/uL (ref 3.87–5.11)
RDW: 13.2 % (ref 11.5–15.5)
WBC Count: 12.3 10*3/uL — ABNORMAL HIGH (ref 4.0–10.5)
nRBC: 0 % (ref 0.0–0.2)

## 2020-05-23 LAB — CMP (CANCER CENTER ONLY)
ALT: 17 U/L (ref 0–44)
AST: 14 U/L — ABNORMAL LOW (ref 15–41)
Albumin: 4.5 g/dL (ref 3.5–5.0)
Alkaline Phosphatase: 103 U/L (ref 38–126)
Anion gap: 8 (ref 5–15)
BUN: 10 mg/dL (ref 6–20)
CO2: 32 mmol/L (ref 22–32)
Calcium: 10 mg/dL (ref 8.9–10.3)
Chloride: 101 mmol/L (ref 98–111)
Creatinine: 0.99 mg/dL (ref 0.44–1.00)
GFR, Est AFR Am: >60 mL/min (ref >60)
GFR, Estimated: >60 mL/min (ref >60)
Glucose, Bld: 111 mg/dL — ABNORMAL HIGH (ref 70–99)
Potassium: 4 mmol/L (ref 3.5–5.1)
Sodium: 141 mmol/L (ref 135–145)
Total Bilirubin: 0.5 mg/dL (ref 0.3–1.2)
Total Protein: 7.3 g/dL (ref 6.5–8.1)

## 2020-05-23 LAB — LACTATE DEHYDROGENASE: LDH: 195 U/L — ABNORMAL HIGH (ref 98–192)

## 2020-05-23 LAB — SAVE SMEAR(SSMR), FOR PROVIDER SLIDE REVIEW

## 2020-05-23 NOTE — Progress Notes (Signed)
Hematology and Oncology Follow Up Visit  Janice Martin [id number] May 11, 1961 59 y.o. 05/23/2020   Principle Diagnosis:   Chronic immune thrombocytopenia-recurrent  Submassive pulmonary embolism, with right lower extremity thrombus  Current Therapy:    Xarelto 10 mg by mouth daily   Decadron-taper per protocol for relapsed thrombocytopenia  Nplate q 3 weeks for platelet count < 200K -- change on 05/23/2020     Interim History:  Janice Martin is back for followup.  As expected, Janice Martin is doing quite well with the Nplate.  I do think Janice Martin has had and Nplate now probably for good 9-10 weeks or so.  Whenever we give Janice Martin Nplate, Janice Martin platelet count goes up quite a bit.  Janice Martin is quite tanned.  Janice Martin and Janice Martin family really went down to the beach.  They are going back on Saturday.  Janice Martin is still working over at [employer].  They now have little more help which makes life easier for Janice Martin.  Janice Martin has had no problems with bleeding.  There has been no issues with cough or shortness of breath.  Janice Martin has had no chest wall pain.  Janice Martin has had no change in bowel or bladder habits.  Janice Martin has had no leg swelling.  There has been no headaches.  Overall, Janice Martin performance status is ECOG 1.   Medications:  Current Outpatient Medications:  .  EPINEPHrine 0.3 mg/0.3 mL IJ SOAJ injection, Inject 0.3 mLs (0.3 mg total) into the muscle as needed for anaphylaxis., Disp: 1 each, Rfl: 2 .  nebivolol (BYSTOLIC) 5 MG tablet, Take 1 tablet (5 mg total) by mouth daily. FU needed for additional refills., Disp: 30 tablet, Rfl: 0 .  valsartan-hydrochlorothiazide (DIOVAN-HCT) 320-25 MG tablet, TAKE 1 TABLET BY MOUTH DAILY., Disp: 30 tablet, Rfl: 0 .  vitamin B-12 (CYANOCOBALAMIN) 100 MCG tablet, Take 100 mcg by mouth at bedtime. , Disp: , Rfl:  .  XARELTO 10 MG TABS tablet, TAKE 1 TABLET BY MOUTH ONCE DAILY WITH SUPPER, Disp: 90 tablet, Rfl: 3  Allergies:  Allergies  Allergen Reactions  . Bee Venom Anaphylaxis, Shortness Of  Breath and Swelling  . Influenza Vaccines Other (See Comments)    Per pt can't take mess up with Janice Martin immune system  . Morphine Other (See Comments)    Burns really bad  . Shellfish Allergy Anaphylaxis    Past Medical History, Surgical history, Social history, and Family History were reviewed and updated.  Review of Systems: Review of Systems  Constitutional: Negative.   HENT: Negative.   Eyes: Negative.   Respiratory: Negative.   Cardiovascular: Negative.   Gastrointestinal: Negative.   Genitourinary: Negative.   Musculoskeletal: Negative.   Skin: Negative.   Neurological: Negative.   Endo/Heme/Allergies: Negative.   Psychiatric/Behavioral: Negative.     Physical Exam:  weight is 190 lb (86.2 kg). Janice Martin oral temperature is 98.3 F (36.8 C). Janice Martin blood pressure is 167/84 (abnormal) and Janice Martin pulse is 80. Janice Martin respiration is 18 and oxygen saturation is 100%.   Physical Exam Vitals reviewed.  HENT:     Head: Normocephalic and atraumatic.  Eyes:     Pupils: Pupils are equal, round, and reactive to light.  Cardiovascular:     Rate and Rhythm: Normal rate and regular rhythm.     Heart sounds: Normal heart sounds.  Pulmonary:     Effort: Pulmonary effort is normal.     Breath sounds: Normal breath sounds.  Abdominal:     General: Bowel sounds are normal.  Palpations: Abdomen is soft.     Comments: Janice Martin has well-healed splenectomy scar.  No fluid wave.  No palpable liver.  No guarding or rebound tenderness.  Musculoskeletal:        General: No tenderness or deformity. Normal range of motion.     Cervical back: Normal range of motion.  Lymphadenopathy:     Cervical: No cervical adenopathy.  Skin:    General: Skin is warm and dry.     Findings: No erythema or rash.  Neurological:     Mental Status: Janice Martin is alert and oriented to person, place, and time.  Psychiatric:        Behavior: Behavior normal.        Thought Content: Thought content normal.        Judgment: Judgment  normal.     Lab Results  Component Value Date   WBC 12.3 (H) 05/23/2020   HGB 13.7 05/23/2020   HCT 40.1 05/23/2020   MCV 93.5 05/23/2020   PLT 267 05/23/2020     Chemistry      Component Value Date/Time   NA 140 04/11/2020 0747   NA 140 09/17/2017 1507   K 3.9 04/11/2020 0747   K 3.9 09/17/2017 1507   CL 100 04/11/2020 0747   CL 99 04/17/2017 1411   CO2 32 04/11/2020 0747   CO2 30 (H) 09/17/2017 1507   BUN 9 04/11/2020 0747   BUN 8.9 09/17/2017 1507   CREATININE 1.05 (H) 04/11/2020 0747   CREATININE 1.2 (H) 09/17/2017 1507      Component Value Date/Time   CALCIUM 10.0 04/11/2020 0747   CALCIUM 9.3 09/17/2017 1507   ALKPHOS 104 04/11/2020 0747   ALKPHOS 105 09/17/2017 1507   AST 13 (L) 04/11/2020 0747   AST 15 09/17/2017 1507   ALT 16 04/11/2020 0747   ALT 18 09/17/2017 1507   BILITOT 0.2 (L) 04/11/2020 0747   BILITOT 0.38 09/17/2017 1507       Impression and Plan: Ms. Ressel is a 59 year old white female. Janice Martin has a history of relapsed chronic immune thrombocytopenia.   I looked at Janice Martin blood under the microscope.  Janice Martin had some Howell-Jolly bodies with some of the red cells.  Janice Martin has adequate platelets now.  Platelets are somewhat large and well granulated.  We will still plan on holding the Nplate.  I think we have a lot of "flexibility" when we have to give it.  Given that Janice Martin is done so well, I really do not think that we have to get Janice Martin back for a blood work in between visits.  I will plan to see Janice Martin back in 6 weeks.    [name], MD 8/4/20218:18 AM

## 2020-05-23 NOTE — Telephone Encounter (Signed)
Appointments scheduled patient declined calendar she has My Chart/ 8/4 los

## 2020-07-06 ENCOUNTER — Other Ambulatory Visit: Payer: 59

## 2020-07-06 ENCOUNTER — Ambulatory Visit: Payer: 59 | Admitting: Hematology & Oncology

## 2020-07-11 ENCOUNTER — Encounter: Payer: Self-pay | Admitting: Hematology & Oncology

## 2020-07-11 ENCOUNTER — Inpatient Hospital Stay: Payer: 59 | Attending: Hematology & Oncology

## 2020-07-11 ENCOUNTER — Other Ambulatory Visit: Payer: Self-pay

## 2020-07-11 ENCOUNTER — Telehealth: Payer: Self-pay | Admitting: Hematology & Oncology

## 2020-07-11 ENCOUNTER — Inpatient Hospital Stay (HOSPITAL_BASED_OUTPATIENT_CLINIC_OR_DEPARTMENT_OTHER): Payer: 59 | Admitting: Hematology & Oncology

## 2020-07-11 VITALS — BP 155/74 | HR 85 | Temp 98.4°F | Resp 18 | Wt 193.0 lb

## 2020-07-11 DIAGNOSIS — I825Z1 Chronic embolism and thrombosis of unspecified deep veins of right distal lower extremity: Secondary | ICD-10-CM

## 2020-07-11 DIAGNOSIS — I2699 Other pulmonary embolism without acute cor pulmonale: Secondary | ICD-10-CM | POA: Diagnosis not present

## 2020-07-11 DIAGNOSIS — D693 Immune thrombocytopenic purpura: Secondary | ICD-10-CM

## 2020-07-11 DIAGNOSIS — Z79899 Other long term (current) drug therapy: Secondary | ICD-10-CM | POA: Insufficient documentation

## 2020-07-11 DIAGNOSIS — Z7901 Long term (current) use of anticoagulants: Secondary | ICD-10-CM | POA: Diagnosis not present

## 2020-07-11 LAB — CBC WITH DIFFERENTIAL (CANCER CENTER ONLY)
Abs Immature Granulocytes: 0.04 10*3/uL (ref 0.00–0.07)
Basophils Absolute: 0.2 10*3/uL — ABNORMAL HIGH (ref 0.0–0.1)
Basophils Relative: 2 %
Eosinophils Absolute: 2.1 10*3/uL — ABNORMAL HIGH (ref 0.0–0.5)
Eosinophils Relative: 21 %
HCT: 40.5 % (ref 36.0–46.0)
Hemoglobin: 13.7 g/dL (ref 12.0–15.0)
Immature Granulocytes: 0 %
Lymphocytes Relative: 34 %
Lymphs Abs: 3.4 10*3/uL (ref 0.7–4.0)
MCH: 31.7 pg (ref 26.0–34.0)
MCHC: 33.8 g/dL (ref 30.0–36.0)
MCV: 93.8 fL (ref 80.0–100.0)
Monocytes Absolute: 0.9 10*3/uL (ref 0.1–1.0)
Monocytes Relative: 9 %
Neutro Abs: 3.6 10*3/uL (ref 1.7–7.7)
Neutrophils Relative %: 34 %
Platelet Count: 246 10*3/uL (ref 150–400)
RBC: 4.32 MIL/uL (ref 3.87–5.11)
RDW: 13.2 % (ref 11.5–15.5)
WBC Count: 10.2 10*3/uL (ref 4.0–10.5)
nRBC: 0 % (ref 0.0–0.2)

## 2020-07-11 LAB — CMP (CANCER CENTER ONLY)
ALT: 17 U/L (ref 0–44)
AST: 14 U/L — ABNORMAL LOW (ref 15–41)
Albumin: 4.3 g/dL (ref 3.5–5.0)
Alkaline Phosphatase: 106 U/L (ref 38–126)
Anion gap: 6 (ref 5–15)
BUN: 11 mg/dL (ref 6–20)
CO2: 32 mmol/L (ref 22–32)
Calcium: 10 mg/dL (ref 8.9–10.3)
Chloride: 102 mmol/L (ref 98–111)
Creatinine: 0.99 mg/dL (ref 0.44–1.00)
GFR, Est AFR Am: >60 mL/min (ref >60)
GFR, Estimated: >60 mL/min (ref >60)
Glucose, Bld: 113 mg/dL — ABNORMAL HIGH (ref 70–99)
Potassium: 4.3 mmol/L (ref 3.5–5.1)
Sodium: 140 mmol/L (ref 135–145)
Total Bilirubin: 0.4 mg/dL (ref 0.3–1.2)
Total Protein: 7.3 g/dL (ref 6.5–8.1)

## 2020-07-11 LAB — SAVE SMEAR(SSMR), FOR PROVIDER SLIDE REVIEW

## 2020-07-11 NOTE — Progress Notes (Signed)
Hematology and Oncology Follow Up Visit  Janice Martin [id number] 12-Sep-1961 59 y.o. 07/11/2020   Principle Diagnosis:   Chronic immune thrombocytopenia-recurrent  Submassive pulmonary embolism, with right lower extremity thrombus  Current Therapy:    Xarelto 10 mg by mouth daily   Decadron-taper per protocol for relapsed thrombocytopenia  Nplate q 3 weeks for platelet count < 200K -- change on 05/23/2020     Interim History:  [name] is back for followup.  She really looks good.  She is working in [employer].  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  I think though they have lost quite a few workers.  She has not had Nplate for quite a while now.  Her platelet count has been maintaining itself.  She has had no bleeding.  She is on Xarelto at low-dose.  She has had no cough or shortness of breath..  She has had no change in bowel or bladder habits.  She has had no leg pain or swelling.  She has had no nausea or vomiting.  There is been no bruising.  She had a good summer.  She had a quiet Labor Day weekend.  Overall, her performance status is ECOG 1.   Medications:  Current Outpatient Medications:  .  nebivolol (BYSTOLIC) 5 MG tablet, Take 1 tablet (5 mg total) by mouth daily. FU needed for additional refills., Disp: 30 tablet, Rfl: 0 .  valsartan-hydrochlorothiazide (DIOVAN-HCT) 320-25 MG tablet, TAKE 1 TABLET BY MOUTH DAILY., Disp: 30 tablet, Rfl: 0 .  vitamin B-12 (CYANOCOBALAMIN) 100 MCG tablet, Take 100 mcg by mouth at bedtime. , Disp: , Rfl:  .  XARELTO 10 MG TABS tablet, TAKE 1 TABLET BY MOUTH ONCE DAILY WITH SUPPER, Disp: 90 tablet, Rfl: 3 .  EPINEPHrine 0.3 mg/0.3 mL IJ SOAJ injection, Inject 0.3 mLs (0.3 mg total) into the muscle as needed for anaphylaxis. (Patient not taking: Reported on 07/11/2020), Disp: 1 each, Rfl: 2  Allergies:  Allergies  Allergen Reactions  . Bee Venom Anaphylaxis, Shortness Of Breath and Swelling  . Influenza Vaccines Other  (See Comments)    Per pt can't take mess up with her immune system  . Morphine Other (See Comments)    Burns really bad  . Shellfish Allergy Anaphylaxis    Past Medical History, Surgical history, Social history, and Family History were reviewed and updated.  Review of Systems: Review of Systems  Constitutional: Negative.   HENT: Negative.   Eyes: Negative.   Respiratory: Negative.   Cardiovascular: Negative.   Gastrointestinal: Negative.   Genitourinary: Negative.   Musculoskeletal: Negative.   Skin: Negative.   Neurological: Negative.   Endo/Heme/Allergies: Negative.   Psychiatric/Behavioral: Negative.     Physical Exam:  weight is 193 lb (87.5 kg). Her oral temperature is 98.4 F (36.9 C). Her blood pressure is 155/74 (abnormal) and her pulse is 85. Her respiration is 18 and oxygen saturation is 100%.   Physical Exam Vitals reviewed.  HENT:     Head: Normocephalic and atraumatic.  Eyes:     Pupils: Pupils are equal, round, and reactive to light.  Cardiovascular:     Rate and Rhythm: Normal rate and regular rhythm.     Heart sounds: Normal heart sounds.  Pulmonary:     Effort: Pulmonary effort is normal.     Breath sounds: Normal breath sounds.  Abdominal:     General: Bowel sounds are normal.     Palpations: Abdomen is soft.  Comments: She has well-healed splenectomy scar.  No fluid wave.  No palpable liver.  No guarding or rebound tenderness.  Musculoskeletal:        General: No tenderness or deformity. Normal range of motion.     Cervical back: Normal range of motion.  Lymphadenopathy:     Cervical: No cervical adenopathy.  Skin:    General: Skin is warm and dry.     Findings: No erythema or rash.  Neurological:     Mental Status: She is alert and oriented to person, place, and time.  Psychiatric:        Behavior: Behavior normal.        Thought Content: Thought content normal.        Judgment: Judgment normal.     Lab Results  Component Value  Date   WBC 10.2 07/11/2020   HGB 13.7 07/11/2020   HCT 40.5 07/11/2020   MCV 93.8 07/11/2020   PLT 246 07/11/2020     Chemistry      Component Value Date/Time   NA 140 07/11/2020 0910   NA 140 09/17/2017 1507   K 4.3 07/11/2020 0910   K 3.9 09/17/2017 1507   CL 102 07/11/2020 0910   CL 99 04/17/2017 1411   CO2 32 07/11/2020 0910   CO2 30 (H) 09/17/2017 1507   BUN 11 07/11/2020 0910   BUN 8.9 09/17/2017 1507   CREATININE 0.99 07/11/2020 0910   CREATININE 1.2 (H) 09/17/2017 1507      Component Value Date/Time   CALCIUM 10.0 07/11/2020 0910   CALCIUM 9.3 09/17/2017 1507   ALKPHOS 106 07/11/2020 0910   ALKPHOS 105 09/17/2017 1507   AST 14 (L) 07/11/2020 0910   AST 15 09/17/2017 1507   ALT 17 07/11/2020 0910   ALT 18 09/17/2017 1507   BILITOT 0.4 07/11/2020 0910   BILITOT 0.38 09/17/2017 1507       Impression and Plan: Janice Martin is a 59 year old white female. She has a history of relapsed chronic immune thrombocytopenia.   I looked at her blood under the microscope.  She had some Howell-Jolly bodies with some of the red cells.  She has adequate platelets now.  Platelets are somewhat large and well granulated.  We will still plan on holding the Nplate.  I think we have a lot of "flexibility" when we have to give it.  Given that she is done so well, I really do not think that we have to get her back for a blood work in between visits.  I will plan to see her back in 8 weeks.    [name], MD 9/22/20219:56 AM

## 2020-07-11 NOTE — Telephone Encounter (Signed)
Appointments scheduled and patient has My Chart Access per 9/22 los

## 2020-08-01 ENCOUNTER — Other Ambulatory Visit: Payer: Self-pay | Admitting: Internal Medicine

## 2020-08-01 MED FILL — NEBIVOLOL HCL 5 MG TABS: 5 | 30 days supply | Qty: 30 | Fill #0

## 2020-08-13 ENCOUNTER — Other Ambulatory Visit: Payer: Self-pay | Admitting: *Deleted

## 2020-08-13 ENCOUNTER — Telehealth: Payer: Self-pay | Admitting: *Deleted

## 2020-08-13 ENCOUNTER — Inpatient Hospital Stay: Payer: 59 | Attending: Hematology & Oncology

## 2020-08-13 ENCOUNTER — Encounter: Payer: Self-pay | Admitting: *Deleted

## 2020-08-13 ENCOUNTER — Other Ambulatory Visit: Payer: Self-pay

## 2020-08-13 DIAGNOSIS — D693 Immune thrombocytopenic purpura: Secondary | ICD-10-CM | POA: Insufficient documentation

## 2020-08-13 DIAGNOSIS — I825Z1 Chronic embolism and thrombosis of unspecified deep veins of right distal lower extremity: Secondary | ICD-10-CM

## 2020-08-13 LAB — CMP (CANCER CENTER ONLY)
ALT: 15 U/L (ref 0–44)
AST: 14 U/L — ABNORMAL LOW (ref 15–41)
Albumin: 4.3 g/dL (ref 3.5–5.0)
Alkaline Phosphatase: 105 U/L (ref 38–126)
Anion gap: 8 (ref 5–15)
BUN: 13 mg/dL (ref 6–20)
CO2: 32 mmol/L (ref 22–32)
Calcium: 10.3 mg/dL (ref 8.9–10.3)
Chloride: 100 mmol/L (ref 98–111)
Creatinine: 0.99 mg/dL (ref 0.44–1.00)
GFR, Estimated: >60 mL/min (ref >60)
Glucose, Bld: 113 mg/dL — ABNORMAL HIGH (ref 70–99)
Potassium: 4.3 mmol/L (ref 3.5–5.1)
Sodium: 140 mmol/L (ref 135–145)
Total Bilirubin: 0.5 mg/dL (ref 0.3–1.2)
Total Protein: 7.7 g/dL (ref 6.5–8.1)

## 2020-08-13 LAB — CBC WITH DIFFERENTIAL (CANCER CENTER ONLY)
Abs Immature Granulocytes: 0.07 10*3/uL (ref 0.00–0.07)
Basophils Absolute: 0.2 10*3/uL — ABNORMAL HIGH (ref 0.0–0.1)
Basophils Relative: 2 %
Eosinophils Absolute: 0 10*3/uL (ref 0.0–0.5)
Eosinophils Relative: 0 %
HCT: 41.6 % (ref 36.0–46.0)
Hemoglobin: 14.2 g/dL (ref 12.0–15.0)
Immature Granulocytes: 1 %
Lymphocytes Relative: 32 %
Lymphs Abs: 3.4 10*3/uL (ref 0.7–4.0)
MCH: 31.9 pg (ref 26.0–34.0)
MCHC: 34.1 g/dL (ref 30.0–36.0)
MCV: 93.5 fL (ref 80.0–100.0)
Monocytes Absolute: 0.8 10*3/uL (ref 0.1–1.0)
Monocytes Relative: 8 %
Neutro Abs: 6.3 10*3/uL (ref 1.7–7.7)
Neutrophils Relative %: 57 %
Platelet Count: 273 10*3/uL (ref 150–400)
RBC: 4.45 MIL/uL (ref 3.87–5.11)
RDW: 13.3 % (ref 11.5–15.5)
WBC Count: 10.7 10*3/uL — ABNORMAL HIGH (ref 4.0–10.5)
nRBC: 0.3 % — ABNORMAL HIGH (ref 0.0–0.2)

## 2020-08-13 NOTE — Progress Notes (Signed)
Pt in lobby and notified that CBC and CMET results are within normal limits per order of Dr. Marin Olp.  Pt appreciative of results and has no questions at this time.

## 2020-08-13 NOTE — Telephone Encounter (Signed)
Call received from patient stating that she is starting to feel "tired" and is concerned that her platelets are dropping.  Patient instructed to come in today at noon for blood work per order of Dr. Marin Olp.  Pt states that she will be here at noon for labs today.

## 2020-09-05 ENCOUNTER — Other Ambulatory Visit: Payer: Self-pay | Admitting: Internal Medicine

## 2020-09-05 ENCOUNTER — Other Ambulatory Visit: Payer: Self-pay

## 2020-09-05 ENCOUNTER — Encounter: Payer: Self-pay | Admitting: Hematology & Oncology

## 2020-09-05 ENCOUNTER — Inpatient Hospital Stay (HOSPITAL_BASED_OUTPATIENT_CLINIC_OR_DEPARTMENT_OTHER): Payer: 59 | Admitting: Hematology & Oncology

## 2020-09-05 ENCOUNTER — Inpatient Hospital Stay: Payer: 59

## 2020-09-05 ENCOUNTER — Telehealth: Payer: Self-pay

## 2020-09-05 ENCOUNTER — Inpatient Hospital Stay: Payer: 59 | Attending: Hematology & Oncology

## 2020-09-05 VITALS — BP 168/79 | HR 80 | Temp 98.6°F | Resp 20 | Wt 196.0 lb

## 2020-09-05 DIAGNOSIS — Z7901 Long term (current) use of anticoagulants: Secondary | ICD-10-CM | POA: Diagnosis not present

## 2020-09-05 DIAGNOSIS — I825Z1 Chronic embolism and thrombosis of unspecified deep veins of right distal lower extremity: Secondary | ICD-10-CM | POA: Diagnosis not present

## 2020-09-05 DIAGNOSIS — I2699 Other pulmonary embolism without acute cor pulmonale: Secondary | ICD-10-CM | POA: Insufficient documentation

## 2020-09-05 DIAGNOSIS — D693 Immune thrombocytopenic purpura: Secondary | ICD-10-CM | POA: Diagnosis not present

## 2020-09-05 LAB — CMP (CANCER CENTER ONLY)
ALT: 15 U/L (ref 0–44)
AST: 12 U/L — ABNORMAL LOW (ref 15–41)
Albumin: 4.4 g/dL (ref 3.5–5.0)
Alkaline Phosphatase: 112 U/L (ref 38–126)
Anion gap: 8 (ref 5–15)
BUN: 13 mg/dL (ref 6–20)
CO2: 32 mmol/L (ref 22–32)
Calcium: 10 mg/dL (ref 8.9–10.3)
Chloride: 101 mmol/L (ref 98–111)
Creatinine: 1.05 mg/dL — ABNORMAL HIGH (ref 0.44–1.00)
GFR, Estimated: >60 mL/min (ref >60)
Glucose, Bld: 106 mg/dL — ABNORMAL HIGH (ref 70–99)
Potassium: 4.4 mmol/L (ref 3.5–5.1)
Sodium: 141 mmol/L (ref 135–145)
Total Bilirubin: 0.4 mg/dL (ref 0.3–1.2)
Total Protein: 7.6 g/dL (ref 6.5–8.1)

## 2020-09-05 LAB — CBC WITH DIFFERENTIAL (CANCER CENTER ONLY)
Abs Immature Granulocytes: 0.05 10*3/uL (ref 0.00–0.07)
Basophils Absolute: 0.1 10*3/uL (ref 0.0–0.1)
Basophils Relative: 1 %
Eosinophils Absolute: 0 10*3/uL (ref 0.0–0.5)
Eosinophils Relative: 0 %
HCT: 41 % (ref 36.0–46.0)
Hemoglobin: 13.8 g/dL (ref 12.0–15.0)
Immature Granulocytes: 1 %
Lymphocytes Relative: 31 %
Lymphs Abs: 3.2 10*3/uL (ref 0.7–4.0)
MCH: 31.7 pg (ref 26.0–34.0)
MCHC: 33.7 g/dL (ref 30.0–36.0)
MCV: 94 fL (ref 80.0–100.0)
Monocytes Absolute: 0.9 10*3/uL (ref 0.1–1.0)
Monocytes Relative: 9 %
Neutro Abs: 6 10*3/uL (ref 1.7–7.7)
Neutrophils Relative %: 58 %
Platelet Count: 177 10*3/uL (ref 150–400)
RBC: 4.36 MIL/uL (ref 3.87–5.11)
RDW: 13.5 % (ref 11.5–15.5)
WBC Count: 10.2 10*3/uL (ref 4.0–10.5)
nRBC: 0 % (ref 0.0–0.2)

## 2020-09-05 LAB — SAVE SMEAR(SSMR), FOR PROVIDER SLIDE REVIEW

## 2020-09-05 MED FILL — VALSARTAN-HCTZ 320-25 MG TA: 320-25 | 30 days supply | Qty: 30 | Fill #0

## 2020-09-05 NOTE — Progress Notes (Signed)
Hematology and Oncology Follow Up Visit  Janice Martin [id number] 09-03-1961 59 y.o. 09/05/2020   Principle Diagnosis:   Chronic immune thrombocytopenia-recurrent  Submassive pulmonary embolism, with right lower extremity thrombus  Current Therapy:    Xarelto 10 mg by mouth daily   Decadron-taper per protocol for relapsed thrombocytopenia  Nplate q 3 weeks for platelet count < 200K -- change on 05/23/2020     Interim History:  [name] is back for followup.  She really looks good.  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  This will probably be in February.  I am very excited about this.  Everything else is doing quite well for her.  She and her family will have a very nice Thanksgiving.  She has had no problems with nausea or vomiting.  Is been no bleeding.  She has had no change in bowel bladder habits.  She is on Xarelto at low-dose and doing well with this.  Is now been probably 3 months since she has had Nplate.  I am so happy that we have had to hold on this.  For right now, she has had no problems with the coronavirus.  She has had her vaccines.  Overall, her performance status is ECOG 1.   Medications:  Current Outpatient Medications:  .  BYSTOLIC 5 MG tablet, TAKE 1 TABLET BY MOUTH ONCE A DAY (NEEDS FOLLOW UP VISIT), Disp: 30 tablet, Rfl: 0 .  valsartan-hydrochlorothiazide (DIOVAN-HCT) 320-25 MG tablet, TAKE 1 TABLET BY MOUTH DAILY., Disp: 30 tablet, Rfl: 0 .  vitamin B-12 (CYANOCOBALAMIN) 100 MCG tablet, Take 100 mcg by mouth at bedtime. , Disp: , Rfl:  .  XARELTO 10 MG TABS tablet, TAKE 1 TABLET BY MOUTH ONCE DAILY WITH SUPPER, Disp: 90 tablet, Rfl: 3 .  EPINEPHrine 0.3 mg/0.3 mL IJ SOAJ injection, Inject 0.3 mLs (0.3 mg total) into the muscle as needed for anaphylaxis. (Patient not taking: Reported on 07/11/2020), Disp: 1 each, Rfl: 2  Allergies:  Allergies  Allergen Reactions  . Bee Venom Anaphylaxis, Shortness Of  Breath and Swelling  . Influenza Vaccines Other (See Comments)    Per pt can't take mess up with her immune system  . Morphine Other (See Comments)    Burns really bad  . Shellfish Allergy Anaphylaxis    Past Medical History, Surgical history, Social history, and Family History were reviewed and updated.  Review of Systems: Review of Systems  Constitutional: Negative.   HENT: Negative.   Eyes: Negative.   Respiratory: Negative.   Cardiovascular: Negative.   Gastrointestinal: Negative.   Genitourinary: Negative.   Musculoskeletal: Negative.   Skin: Negative.   Neurological: Negative.   Endo/Heme/Allergies: Negative.   Psychiatric/Behavioral: Negative.     Physical Exam:  weight is 196 lb (88.9 kg). Her oral temperature is 98.6 F (37 C). Her blood pressure is 168/79 (abnormal) and her pulse is 80. Her respiration is 20 and oxygen saturation is 100%.   Physical Exam Vitals reviewed.  HENT:     Head: Normocephalic and atraumatic.  Eyes:     Pupils: Pupils are equal, round, and reactive to light.  Cardiovascular:     Rate and Rhythm: Normal rate and regular rhythm.     Heart sounds: Normal heart sounds.  Pulmonary:     Effort: Pulmonary effort is normal.     Breath sounds: Normal breath sounds.  Abdominal:     General: Bowel sounds are  normal.     Palpations: Abdomen is soft.     Comments: She has well-healed splenectomy scar.  No fluid wave.  No palpable liver.  No guarding or rebound tenderness.  Musculoskeletal:        General: No tenderness or deformity. Normal range of motion.     Cervical back: Normal range of motion.  Lymphadenopathy:     Cervical: No cervical adenopathy.  Skin:    General: Skin is warm and dry.     Findings: No erythema or rash.  Neurological:     Mental Status: She is alert and oriented to person, place, and time.  Psychiatric:        Behavior: Behavior normal.        Thought Content: Thought content normal.        Judgment: Judgment  normal.     Lab Results  Component Value Date   WBC 10.2 09/05/2020   HGB 13.8 09/05/2020   HCT 41.0 09/05/2020   MCV 94.0 09/05/2020   PLT 177 09/05/2020     Chemistry      Component Value Date/Time   NA 140 08/13/2020 1206   NA 140 09/17/2017 1507   K 4.3 08/13/2020 1206   K 3.9 09/17/2017 1507   CL 100 08/13/2020 1206   CL 99 04/17/2017 1411   CO2 32 08/13/2020 1206   CO2 30 (H) 09/17/2017 1507   BUN 13 08/13/2020 1206   BUN 8.9 09/17/2017 1507   CREATININE 0.99 08/13/2020 1206   CREATININE 1.2 (H) 09/17/2017 1507      Component Value Date/Time   CALCIUM 10.3 08/13/2020 1206   CALCIUM 9.3 09/17/2017 1507   ALKPHOS 105 08/13/2020 1206   ALKPHOS 105 09/17/2017 1507   AST 14 (L) 08/13/2020 1206   AST 15 09/17/2017 1507   ALT 15 08/13/2020 1206   ALT 18 09/17/2017 1507   BILITOT 0.5 08/13/2020 1206   BILITOT 0.38 09/17/2017 1507       Impression and Plan: Janice Martin is a 59 year old white female. She has a history of relapsed chronic immune thrombocytopenia.   I looked at her blood under the microscope.  She had some Howell-Jolly bodies with some of the red cells.  She has adequate platelets now.  Platelets are somewhat large and well granulated.  We will still plan on holding the Nplate.  I think we have a lot of "flexibility" when we have to give it.  Given that she is done so well, I really do not think that we have to get her back for a blood work in between visits.  We will now be able to get her through the holiday season.  Again a very side about her being able to work with [place].  [name], MD 11/17/20218:20 AM

## 2020-09-05 NOTE — Telephone Encounter (Signed)
appts made and printed for pt per 09/05/20 los .... AOM

## 2020-09-08 DIAGNOSIS — U071 COVID-19: Secondary | ICD-10-CM | POA: Diagnosis not present

## 2020-09-10 ENCOUNTER — Telehealth: Payer: Self-pay | Admitting: *Deleted

## 2020-09-10 ENCOUNTER — Encounter: Payer: Self-pay | Admitting: Internal Medicine

## 2020-09-10 ENCOUNTER — Telehealth: Payer: Self-pay | Admitting: Internal Medicine

## 2020-09-10 NOTE — Telephone Encounter (Signed)
Patient wondering if she can get a referral for the monoclonal infusions. Patient was diagnosed with covid on Saturday November 20th.  Patient # [id number]

## 2020-09-10 NOTE — Telephone Encounter (Signed)
Patient called to let us know she tested positive for COVID.  Is on 10 day quarantine.  Asked Dr Marin Olp if she should get the monoclonal infusion.  Dr Marin Olp states he does not have a problem with this but would need to check with her PCP about ordering the infusion.  Patient understanding of information.

## 2020-09-11 ENCOUNTER — Other Ambulatory Visit: Payer: Self-pay | Admitting: Oncology

## 2020-09-11 ENCOUNTER — Encounter: Payer: Self-pay | Admitting: Oncology

## 2020-09-11 DIAGNOSIS — U071 COVID-19: Secondary | ICD-10-CM

## 2020-09-11 NOTE — Telephone Encounter (Signed)
He has already been referred-  She is aware -  via [employer]

## 2020-09-11 NOTE — Progress Notes (Signed)
I connected by phone with Mrs. Grau to discuss the potential use of an new treatment for mild to moderate COVID-19 viral infection in non-hospitalized patients.   This patient is a age/sex that meets the FDA criteria for Emergency Use Authorization of casirivimab\imdevimab.  Has a (+) direct SARS-CoV-2 viral test result 1. Has mild or moderate COVID-19  2. Is ? 57 years of age and weighs ? 40 kg 3. Is NOT hospitalized due to COVID-19 4. Is NOT requiring oxygen therapy or requiring an increase in baseline oxygen flow rate due to COVID-19 5. Is within 10 days of symptom onset 6. Has at least one of the high risk factor(s) for progression to severe COVID-19 and/or hospitalization as defined in EUA. Specific high risk criteria : Past Medical History:  Diagnosis Date  . ANEMIA-IRON DEFICIENCY   . Anxiety state, unspecified   . DVT of leg (deep venous thrombosis) ([place]) 04/04/2015  . HYPERTENSION   . Immune thrombocytopenic purpura ([place]) 2003 dx   s/p rituxan 06/2012 - now remission  . LEUKOCYTOSIS UNSPECIFIED   . Pulmonary embolism [facility]) June 2016   chronic anticoag due to high risk recurrence  ?  ?    Symptom onset  09/08/20   I have spoken and communicated the following to the patient or parent/caregiver:   1. FDA has authorized the emergency use of casirivimab\imdevimab for the treatment of mild to moderate COVID-19 in adults and pediatric patients with positive results of direct SARS-CoV-2 viral testing who are 70 years of age and older weighing at least 40 kg, and who are at high risk for progressing to severe COVID-19 and/or hospitalization.   2. The significant known and potential risks and benefits of casirivimab\imdevimab, and the extent to which such potential risks and benefits are unknown.   3. Information on available alternative treatments and the risks and benefits of those alternatives, including clinical trials.   4. Patients treated with casirivimab\imdevimab should  continue to self-isolate and use infection control measures (e.g., wear mask, isolate, social distance, avoid sharing personal items, clean and disinfect "high touch" surfaces, and frequent handwashing) according to CDC guidelines.    5. The patient or parent/caregiver has the option to accept or refuse casirivimab\imdevimab .   After reviewing this information with the patient, The patient agreed to proceed with receiving casirivimab\imdevimab infusion and will be provided a copy of the Fact sheet prior to receiving the infusion.Rulon Abide, AGNP-C [phone] ([place])

## 2020-09-12 ENCOUNTER — Other Ambulatory Visit (HOSPITAL_COMMUNITY): Payer: Self-pay

## 2020-09-12 ENCOUNTER — Ambulatory Visit (HOSPITAL_COMMUNITY)
Admission: RE | Admit: 2020-09-12 | Discharge: 2020-09-12 | Disposition: A | Discharge status: Home / Self Care | Payer: 59 | Source: Ambulatory Visit | Attending: Pulmonary Disease | Admitting: Pulmonary Disease

## 2020-09-12 DIAGNOSIS — U071 COVID-19: Secondary | ICD-10-CM | POA: Diagnosis not present

## 2020-09-12 DIAGNOSIS — D849 Immunodeficiency, unspecified: Secondary | ICD-10-CM | POA: Insufficient documentation

## 2020-09-12 MED ORDER — METHYLPREDNISOLONE SODIUM SUCC 125 MG IJ SOLR: 125.0000 mg | Freq: Once | INTRAMUSCULAR | Status: DC | PRN

## 2020-09-12 MED ORDER — ALBUTEROL SULFATE HFA 108 (90 BASE) MCG/ACT IN AERS: 2.0000 | INHALATION_SPRAY | Freq: Once | RESPIRATORY_TRACT | Status: DC | PRN

## 2020-09-12 MED ORDER — SOTROVIMAB 500 MG/8ML IV SOLN
500.0000 mg | Freq: Once | INTRAVENOUS | Status: AC
  Administered 2020-09-12: 500 mg via INTRAVENOUS

## 2020-09-12 MED ORDER — FAMOTIDINE IN NACL 20-0.9 MG/50ML-% IV SOLN: 20.0000 mg | Freq: Once | INTRAVENOUS | Status: DC | PRN

## 2020-09-12 MED ORDER — DIPHENHYDRAMINE HCL 50 MG/ML IJ SOLN: 50.0000 mg | Freq: Once | INTRAMUSCULAR | Status: DC | PRN

## 2020-09-12 MED ORDER — EPINEPHRINE 0.3 MG/0.3ML IJ SOAJ: 0.3000 mg | Freq: Once | INTRAMUSCULAR | Status: DC | PRN

## 2020-09-12 MED ORDER — SODIUM CHLORIDE 0.9 % IV SOLN: INTRAVENOUS | Status: DC | PRN

## 2020-09-12 NOTE — Discharge Instructions (Signed)

## 2020-09-12 NOTE — Progress Notes (Signed)
Diagnosis: COVID-19  Physician: Dr. Patrick Wright  Procedure: Covid Infusion Clinic Med: Sotrovimab infusion - Provided patient with sotrovimab fact sheet for patients, parents, and caregivers prior to infusion.   Complications: No immediate complications noted  Discharge: Discharged home  If after the infusion you have any questions or concerns please call the Advanced Practice Provider at [phone] 

## 2020-11-07 ENCOUNTER — Other Ambulatory Visit: Payer: Self-pay

## 2020-11-07 ENCOUNTER — Inpatient Hospital Stay: Payer: 59 | Attending: Hematology & Oncology

## 2020-11-07 ENCOUNTER — Telehealth: Payer: Self-pay

## 2020-11-07 ENCOUNTER — Inpatient Hospital Stay (HOSPITAL_BASED_OUTPATIENT_CLINIC_OR_DEPARTMENT_OTHER): Payer: 59 | Admitting: Hematology & Oncology

## 2020-11-07 VITALS — BP 180/96 | HR 89 | Temp 98.4°F | Resp 20 | Wt 197.0 lb

## 2020-11-07 DIAGNOSIS — D693 Immune thrombocytopenic purpura: Secondary | ICD-10-CM | POA: Insufficient documentation

## 2020-11-07 DIAGNOSIS — I2699 Other pulmonary embolism without acute cor pulmonale: Secondary | ICD-10-CM | POA: Insufficient documentation

## 2020-11-07 DIAGNOSIS — Z7901 Long term (current) use of anticoagulants: Secondary | ICD-10-CM | POA: Diagnosis not present

## 2020-11-07 DIAGNOSIS — I825Z1 Chronic embolism and thrombosis of unspecified deep veins of right distal lower extremity: Secondary | ICD-10-CM

## 2020-11-07 LAB — CMP (CANCER CENTER ONLY)
ALT: 19 U/L (ref 0–44)
AST: 15 U/L (ref 15–41)
Albumin: 4.2 g/dL (ref 3.5–5.0)
Alkaline Phosphatase: 104 U/L (ref 38–126)
Anion gap: 7 (ref 5–15)
BUN: 10 mg/dL (ref 6–20)
CO2: 32 mmol/L (ref 22–32)
Calcium: 9.9 mg/dL (ref 8.9–10.3)
Chloride: 101 mmol/L (ref 98–111)
Creatinine: 1 mg/dL (ref 0.44–1.00)
GFR, Estimated: >60 mL/min (ref >60)
Glucose, Bld: 108 mg/dL — ABNORMAL HIGH (ref 70–99)
Potassium: 4.3 mmol/L (ref 3.5–5.1)
Sodium: 140 mmol/L (ref 135–145)
Total Bilirubin: 0.5 mg/dL (ref 0.3–1.2)
Total Protein: 7.3 g/dL (ref 6.5–8.1)

## 2020-11-07 LAB — CBC WITH DIFFERENTIAL (CANCER CENTER ONLY)
Abs Immature Granulocytes: 0.04 10*3/uL (ref 0.00–0.07)
Basophils Absolute: 0.1 10*3/uL (ref 0.0–0.1)
Basophils Relative: 1 %
Eosinophils Absolute: 0.7 10*3/uL — ABNORMAL HIGH (ref 0.0–0.5)
Eosinophils Relative: 7 %
HCT: 39.9 % (ref 36.0–46.0)
Hemoglobin: 13.6 g/dL (ref 12.0–15.0)
Immature Granulocytes: 0 %
Lymphocytes Relative: 35 %
Lymphs Abs: 3.8 10*3/uL (ref 0.7–4.0)
MCH: 31.6 pg (ref 26.0–34.0)
MCHC: 34.1 g/dL (ref 30.0–36.0)
MCV: 92.8 fL (ref 80.0–100.0)
Monocytes Absolute: 1.1 10*3/uL — ABNORMAL HIGH (ref 0.1–1.0)
Monocytes Relative: 10 %
Neutro Abs: 5.1 10*3/uL (ref 1.7–7.7)
Neutrophils Relative %: 47 %
Platelet Count: 211 10*3/uL (ref 150–400)
RBC: 4.3 MIL/uL (ref 3.87–5.11)
RDW: 13.2 % (ref 11.5–15.5)
WBC Count: 10.9 10*3/uL — ABNORMAL HIGH (ref 4.0–10.5)
nRBC: 0 % (ref 0.0–0.2)

## 2020-11-07 LAB — LACTATE DEHYDROGENASE: LDH: 161 U/L (ref 98–192)

## 2020-11-07 LAB — SAVE SMEAR(SSMR), FOR PROVIDER SLIDE REVIEW

## 2020-11-07 NOTE — Telephone Encounter (Signed)
Called and left a vm with 01-2021 appt per los   [name]

## 2020-11-07 NOTE — Progress Notes (Signed)
Hematology and Oncology Follow Up Visit  Janice Martin [id number] 1961/04/06 60 y.o. 11/07/2020   Principle Diagnosis:   Chronic immune thrombocytopenia-recurrent  Submassive pulmonary embolism, with right lower extremity thrombus  Current Therapy:    Xarelto 10 mg by mouth daily   Decadron-taper per protocol for relapsed thrombocytopenia  Nplate q 3 weeks for platelet count < 200K -- change on 05/23/2020     Interim History:  [name] is back for followup.  Janice Martin is under quite a bit of stress.  This is because of work.  Thankfully, Janice Martin will be leaving her job and coming out to work with [place] in about 10 days.  Janice Martin did enjoy the holiday season.  Janice Martin and her husband were with family.  Janice Martin has had no problems with bleeding.  Janice Martin says that Janice Martin will have or will need some dental implants this year.  I told her that I had no problems with her getting dental implants as long as her blood counts were okay.  However, I do think that Janice Martin is probably going to need some antibiotic coverage since Janice Martin does not have a spleen.  Janice Martin will let us know when the procedure will happen.  Janice Martin has not had nplate for many months.  Janice Martin really has done nicely.  Janice Martin is doing well with the Xarelto without any bleeding.  Overall, performance status is ECOG 0.    Medications:  Current Outpatient Medications:  .  BYSTOLIC 5 MG tablet, TAKE 1 TABLET BY MOUTH ONCE A DAY (NEEDS FOLLOW UP VISIT), Disp: 30 tablet, Rfl: 0 .  valsartan-hydrochlorothiazide (DIOVAN-HCT) 320-25 MG tablet, TAKE 1 TABLET BY MOUTH DAILY., Disp: 30 tablet, Rfl: 0 .  vitamin B-12 (CYANOCOBALAMIN) 100 MCG tablet, Take 100 mcg by mouth at bedtime. , Disp: , Rfl:  .  XARELTO 10 MG TABS tablet, TAKE 1 TABLET BY MOUTH ONCE DAILY WITH SUPPER, Disp: 90 tablet, Rfl: 3 .  EPINEPHrine 0.3 mg/0.3 mL IJ SOAJ injection, Inject 0.3 mLs (0.3 mg total) into the muscle as needed for anaphylaxis. (Patient not taking: No sig reported), Disp: 1 each, Rfl:  2  Allergies:  Allergies  Allergen Reactions  . Bee Venom Anaphylaxis, Shortness Of Breath and Swelling  . Influenza Vaccines Other (See Comments)    Per pt can't take mess up with her immune system  . Morphine Other (See Comments)    Burns really bad  . Shellfish Allergy Anaphylaxis    Past Medical History, Surgical history, Social history, and Family History were reviewed and updated.  Review of Systems: Review of Systems  Constitutional: Negative.   HENT: Negative.   Eyes: Negative.   Respiratory: Negative.   Cardiovascular: Negative.   Gastrointestinal: Negative.   Genitourinary: Negative.   Musculoskeletal: Negative.   Skin: Negative.   Neurological: Negative.   Endo/Heme/Allergies: Negative.   Psychiatric/Behavioral: Negative.     Physical Exam:  weight is 197 lb (89.4 kg). Her oral temperature is 98.4 F (36.9 C). Her blood pressure is 180/96 (abnormal) and her pulse is 89. Her respiration is 20.   Physical Exam Vitals reviewed.  HENT:     Head: Normocephalic and atraumatic.  Eyes:     Pupils: Pupils are equal, round, and reactive to light.  Cardiovascular:     Rate and Rhythm: Normal rate and regular rhythm.     Heart sounds: Normal heart sounds.  Pulmonary:     Effort: Pulmonary effort is normal.     Breath sounds: Normal breath  sounds.  Abdominal:     General: Bowel sounds are normal.     Palpations: Abdomen is soft.     Comments: Janice Martin has well-healed splenectomy scar.  No fluid wave.  No palpable liver.  No guarding or rebound tenderness.  Musculoskeletal:        General: No tenderness or deformity. Normal range of motion.     Cervical back: Normal range of motion.  Lymphadenopathy:     Cervical: No cervical adenopathy.  Skin:    General: Skin is warm and dry.     Findings: No erythema or rash.  Neurological:     Mental Status: Janice Martin is alert and oriented to person, place, and time.  Psychiatric:        Behavior: Behavior normal.        Thought  Content: Thought content normal.        Judgment: Judgment normal.     Lab Results  Component Value Date   WBC 10.9 (H) 11/07/2020   HGB 13.6 11/07/2020   HCT 39.9 11/07/2020   MCV 92.8 11/07/2020   PLT 211 11/07/2020     Chemistry      Component Value Date/Time   NA 140 11/07/2020 0747   NA 140 09/17/2017 1507   K 4.3 11/07/2020 0747   K 3.9 09/17/2017 1507   CL 101 11/07/2020 0747   CL 99 04/17/2017 1411   CO2 32 11/07/2020 0747   CO2 30 (H) 09/17/2017 1507   BUN 10 11/07/2020 0747   BUN 8.9 09/17/2017 1507   CREATININE 1.00 11/07/2020 0747   CREATININE 1.2 (H) 09/17/2017 1507      Component Value Date/Time   CALCIUM 9.9 11/07/2020 0747   CALCIUM 9.3 09/17/2017 1507   ALKPHOS 104 11/07/2020 0747   ALKPHOS 105 09/17/2017 1507   AST 15 11/07/2020 0747   AST 15 09/17/2017 1507   ALT 19 11/07/2020 0747   ALT 18 09/17/2017 1507   BILITOT 0.5 11/07/2020 0747   BILITOT 0.38 09/17/2017 1507       Impression and Plan: Janice Martin is a 60 year old white female. Janice Martin has a history of relapsed chronic immune thrombocytopenia.   I looked at her blood under the microscope.  Janice Martin had some Howell-Jolly bodies with some of the red cells.  Janice Martin has adequate platelets now.  Platelets are somewhat large and well granulated.  We will still plan on holding the Nplate.  I think we have a lot of "flexibility" when we have to give it.  We will now move her appointments out to 3 months.  I think this would be reasonable.  Since that Janice Martin will be working with [place], if that Janice Martin has any problems, Janice Martin will certainly let us know.  Again, Janice Martin will let us know about any timeframe for the dental implants.  [name], MD 1/19/20228:26 AM

## 2020-11-08 ENCOUNTER — Other Ambulatory Visit: Payer: Self-pay | Admitting: Hematology & Oncology

## 2020-11-08 ENCOUNTER — Other Ambulatory Visit: Payer: Self-pay | Admitting: Internal Medicine

## 2020-11-08 ENCOUNTER — Telehealth: Payer: Self-pay | Admitting: Internal Medicine

## 2020-11-08 DIAGNOSIS — I2782 Chronic pulmonary embolism: Secondary | ICD-10-CM

## 2020-11-08 DIAGNOSIS — I2602 Saddle embolus of pulmonary artery with acute cor pulmonale: Secondary | ICD-10-CM

## 2020-11-08 DIAGNOSIS — I2692 Saddle embolus of pulmonary artery without acute cor pulmonale: Secondary | ICD-10-CM

## 2020-11-08 DIAGNOSIS — I825Z2 Chronic embolism and thrombosis of unspecified deep veins of left distal lower extremity: Secondary | ICD-10-CM

## 2020-11-08 DIAGNOSIS — D693 Immune thrombocytopenic purpura: Secondary | ICD-10-CM

## 2020-11-08 MED FILL — XARELTO 10 MG TABLET: 10 | 90 days supply | Qty: 90 | Fill #0

## 2020-11-08 NOTE — Telephone Encounter (Signed)
Patient wondering if she can do a virtual visit for her medication refill, I see it was for blood pressure medication so I wanted to ask to make sure this is okay before I scheduled.

## 2020-11-11 NOTE — Progress Notes (Signed)
Virtual Visit via telephone Note  I connected with Janice Martin on 11/11/20 at 11:15 AM EST by telephone and verified that I am speaking with the correct person using two identifiers.   I discussed the limitations of evaluation and management by telemedicine and the availability of in person appointments. The patient expressed understanding and agreed to proceed.  Present for the visit:  Myself, Dr Billey Gosling, Leonard Downing.  The patient is currently at work and I am in the office.    No referring provider.    History of Present Illness: She is here for follow up of her chronic medical conditions.    Hypertension: She is taking her medication daily. She is compliant with a low sodium diet.  She denies chest pain, palpitations, edema, shortness of breath and regular headaches. She does monitor her blood pressure and it has been controlled.   She is not exercising, but plans on starting.  She wants to lose weight.          Social History   Socioeconomic History  . Marital status: Married    Spouse name: Not on file  . Number of children: Not on file  . Years of education: Not on file  . Highest education level: Not on file  Occupational History  . Not on file  Tobacco Use  . Smoking status: Never Smoker  . Smokeless tobacco: Never Used  . Tobacco comment: never used product  Vaping Use  . Vaping Use: Never used  Substance and Sexual Activity  . Alcohol use: No    Alcohol/week: 0.0 standard drinks  . Drug use: No  . Sexual activity: Not on file  Other Topics Concern  . Not on file  Social History Narrative  . Not on file   Social Determinants of Health   Financial Resource Strain: Not on file  Food Insecurity: Not on file  Transportation Needs: Not on file  Physical Activity: Not on file  Stress: Not on file  Social Connections: Not on file      Assessment and Plan:  See Problem List for Assessment and Plan of chronic medical problems.   Follow Up  Instructions:    I discussed the assessment and treatment plan with the patient. The patient was provided an opportunity to ask questions and all were answered. The patient agreed with the plan and demonstrated an understanding of the instructions.   The patient was advised to call back or seek an in-person evaluation if the symptoms worsen or if the condition fails to improve as anticipated.  Time spent on telephone call - 7 minutes  Binnie Rail, MD

## 2020-11-12 ENCOUNTER — Telehealth (INDEPENDENT_AMBULATORY_CARE_PROVIDER_SITE_OTHER): Payer: 59 | Admitting: Internal Medicine

## 2020-11-12 ENCOUNTER — Other Ambulatory Visit: Payer: Self-pay | Admitting: Internal Medicine

## 2020-11-12 ENCOUNTER — Encounter: Payer: Self-pay | Admitting: Internal Medicine

## 2020-11-12 DIAGNOSIS — I10 Essential (primary) hypertension: Secondary | ICD-10-CM | POA: Diagnosis not present

## 2020-11-12 MED ORDER — NEBIVOLOL HCL 5 MG PO TABS: ORAL_TABLET | ORAL | 1 refills | Status: DC

## 2020-11-12 MED ORDER — VALSARTAN-HYDROCHLOROTHIAZIDE 320-25 MG PO TABS
1.0000 | ORAL_TABLET | Freq: Every day | ORAL | 1 refills | Status: DC
Start: 2020-11-12 — End: 2020-11-12

## 2020-11-12 MED FILL — VALSARTAN-HCTZ 320-25 MG TA: 320-25 | 90 days supply | Qty: 90 | Fill #0

## 2020-11-12 MED FILL — NEBIVOLOL HCL 5 MG TABS: 5 | 90 days supply | Qty: 90 | Fill #0

## 2020-11-12 NOTE — Assessment & Plan Note (Signed)
Chronic BP well controlled Continue bystolic 5 mg daily, diovan-hct 320-25 mg daily cmp

## 2021-01-29 ENCOUNTER — Other Ambulatory Visit (HOSPITAL_BASED_OUTPATIENT_CLINIC_OR_DEPARTMENT_OTHER): Payer: Self-pay

## 2021-01-29 MED ORDER — IBUPROFEN 600 MG PO TABS
ORAL_TABLET | ORAL | 0 refills | Status: DC
  Filled 2021-01-29: qty 30, 7d supply, fill #0

## 2021-01-29 MED ORDER — PENICILLIN V POTASSIUM 500 MG PO TABS
ORAL_TABLET | ORAL | 0 refills | Status: DC
  Filled 2021-01-29: qty 30, 7d supply, fill #0

## 2021-01-29 MED ORDER — METHYLPREDNISOLONE 4 MG PO TBPK
ORAL_TABLET | ORAL | 0 refills | Status: DC
  Filled 2021-01-29: qty 21, 6d supply, fill #0

## 2021-02-05 ENCOUNTER — Encounter: Payer: Self-pay | Admitting: Hematology & Oncology

## 2021-02-05 ENCOUNTER — Inpatient Hospital Stay (HOSPITAL_BASED_OUTPATIENT_CLINIC_OR_DEPARTMENT_OTHER): Payer: 59 | Admitting: Hematology & Oncology

## 2021-02-05 ENCOUNTER — Other Ambulatory Visit: Payer: Self-pay

## 2021-02-05 ENCOUNTER — Inpatient Hospital Stay: Payer: 59 | Attending: Hematology & Oncology

## 2021-02-05 VITALS — BP 149/77 | HR 81 | Temp 98.4°F | Resp 16 | Wt 195.0 lb

## 2021-02-05 DIAGNOSIS — D693 Immune thrombocytopenic purpura: Secondary | ICD-10-CM | POA: Diagnosis not present

## 2021-02-05 DIAGNOSIS — Z7952 Long term (current) use of systemic steroids: Secondary | ICD-10-CM | POA: Diagnosis not present

## 2021-02-05 DIAGNOSIS — I2699 Other pulmonary embolism without acute cor pulmonale: Secondary | ICD-10-CM | POA: Insufficient documentation

## 2021-02-05 DIAGNOSIS — Z7901 Long term (current) use of anticoagulants: Secondary | ICD-10-CM | POA: Diagnosis not present

## 2021-02-05 LAB — CMP (CANCER CENTER ONLY)
ALT: 18 U/L (ref 0–44)
AST: 16 U/L (ref 15–41)
Albumin: 4.1 g/dL (ref 3.5–5.0)
Alkaline Phosphatase: 105 U/L (ref 38–126)
Anion gap: 8 (ref 5–15)
BUN: 10 mg/dL (ref 6–20)
CO2: 32 mmol/L (ref 22–32)
Calcium: 9.8 mg/dL (ref 8.9–10.3)
Chloride: 100 mmol/L (ref 98–111)
Creatinine: 1 mg/dL (ref 0.44–1.00)
GFR, Estimated: >60 mL/min (ref >60)
Glucose, Bld: 94 mg/dL (ref 70–99)
Potassium: 4.2 mmol/L (ref 3.5–5.1)
Sodium: 140 mmol/L (ref 135–145)
Total Bilirubin: 0.4 mg/dL (ref 0.3–1.2)
Total Protein: 7.4 g/dL (ref 6.5–8.1)

## 2021-02-05 LAB — LACTATE DEHYDROGENASE: LDH: 171 U/L (ref 98–192)

## 2021-02-05 LAB — CBC WITH DIFFERENTIAL (CANCER CENTER ONLY)
Abs Immature Granulocytes: 0.04 10*3/uL (ref 0.00–0.07)
Basophils Absolute: 0.1 10*3/uL (ref 0.0–0.1)
Basophils Relative: 1 %
Eosinophils Absolute: 0.1 10*3/uL (ref 0.0–0.5)
Eosinophils Relative: 1 %
HCT: 38.9 % (ref 36.0–46.0)
Hemoglobin: 13.4 g/dL (ref 12.0–15.0)
Immature Granulocytes: 0 %
Lymphocytes Relative: 31 %
Lymphs Abs: 3.3 10*3/uL (ref 0.7–4.0)
MCH: 31.7 pg (ref 26.0–34.0)
MCHC: 34.4 g/dL (ref 30.0–36.0)
MCV: 92 fL (ref 80.0–100.0)
Monocytes Absolute: 0.9 10*3/uL (ref 0.1–1.0)
Monocytes Relative: 9 %
Neutro Abs: 6.2 10*3/uL (ref 1.7–7.7)
Neutrophils Relative %: 58 %
Platelet Count: 285 10*3/uL (ref 150–400)
RBC: 4.23 MIL/uL (ref 3.87–5.11)
RDW: 13.5 % (ref 11.5–15.5)
WBC Count: 10.7 10*3/uL — ABNORMAL HIGH (ref 4.0–10.5)
nRBC: 0 % (ref 0.0–0.2)

## 2021-02-05 LAB — SAVE SMEAR(SSMR), FOR PROVIDER SLIDE REVIEW

## 2021-02-05 NOTE — Progress Notes (Signed)
Hematology and Oncology Follow Up Visit  BAYLYNN SHIFFLETT [id number] 31-Oct-1960 60 y.o. 02/05/2021   Principle Diagnosis:   Chronic immune thrombocytopenia-recurrent  Submassive pulmonary embolism, with right lower extremity thrombus  Current Therapy:    Xarelto 10 mg by mouth daily   Decadron-taper per protocol for relapsed thrombocytopenia  Nplate q 3 weeks for platelet count < 200K -- change on 05/23/2020     Interim History:  Ms.  Pohlman is back for followup.  Thankfully, she is now working in her office.  She really enjoys working in her office.  We are very grateful for all of her hard work.  She really is doing nicely.  She has had no issues with respect to the thrombocytopenia.  She has had no problems with the Xarelto for the blood clot in her lung.  There is been no problems with fever or chills.  She has had no issues with the COVID.  She is going for dental implants on June 9.  I do not think this would be a problem.  We will make sure that we check her blood counts before she has a procedure.  Currently, her performance status is ECOG 1.  Medications:  Current Outpatient Medications:  .  EPINEPHrine 0.3 mg/0.3 mL IJ SOAJ injection, Inject 0.3 mLs (0.3 mg total) into the muscle as needed for anaphylaxis. (Patient not taking: No sig reported), Disp: 1 each, Rfl: 2 .  ibuprofen (ADVIL) 600 MG tablet, TAKE 1 TABLET BY MOUTH EVERY 6 HOURS AS NEEDED FOR DENTAL PAIN, Disp: 30 tablet, Rfl: 0 .  methylPREDNISolone (MEDROL DOSEPAK) 4 MG TBPK tablet, TAKE AS DIRECTED FOLLOW PACKAGE INSTRUCTIONS, Disp: 21 tablet, Rfl: 0 .  nebivolol (BYSTOLIC) 5 MG tablet, TAKE 1 TABLET BY MOUTH ONCE A DAY (NEED OFFICE VISIT FOR FURTURE REFILLS), Disp: 90 tablet, Rfl: 1 .  penicillin v potassium (VEETID) 500 MG tablet, TAKE 1 TABLET BY MOUTH EVERY 6 HOURS UNTIL GONE, Disp: 30 tablet, Rfl: 0 .  rivaroxaban (XARELTO) 10 MG TABS tablet, TAKE 1 TABLET BY MOUTH ONCE DAILY WITH SUPPER, Disp: 90 tablet,  Rfl: 3 .  valsartan-hydrochlorothiazide (DIOVAN-HCT) 320-25 MG tablet, TAKE 1 TABLET BY MOUTH DAILY., Disp: 90 tablet, Rfl: 1 .  vitamin B-12 (CYANOCOBALAMIN) 100 MCG tablet, Take 100 mcg by mouth at bedtime. , Disp: , Rfl:   Allergies:  Allergies  Allergen Reactions  . Bee Venom Anaphylaxis, Shortness Of Breath and Swelling  . Influenza Vaccines Other (See Comments)    Per pt can't take mess up with her immune system  . Morphine Other (See Comments)    Burns really bad  . Shellfish Allergy Anaphylaxis    Past Medical History, Surgical history, Social history, and Family History were reviewed and updated.  Review of Systems: Review of Systems  Constitutional: Negative.   HENT: Negative.   Eyes: Negative.   Respiratory: Negative.   Cardiovascular: Negative.   Gastrointestinal: Negative.   Genitourinary: Negative.   Musculoskeletal: Negative.   Skin: Negative.   Neurological: Negative.   Endo/Heme/Allergies: Negative.   Psychiatric/Behavioral: Negative.     Physical Exam:  weight is 195 lb (88.5 kg).   Physical Exam Vitals reviewed.  HENT:     Head: Normocephalic and atraumatic.  Eyes:     Pupils: Pupils are equal, round, and reactive to light.  Cardiovascular:     Rate and Rhythm: Normal rate and regular rhythm.     Heart sounds: Normal heart sounds.  Pulmonary:     Effort:  Pulmonary effort is normal.     Breath sounds: Normal breath sounds.  Abdominal:     General: Bowel sounds are normal.     Palpations: Abdomen is soft.     Comments: She has well-healed splenectomy scar.  No fluid wave.  No palpable liver.  No guarding or rebound tenderness.  Musculoskeletal:        General: No tenderness or deformity. Normal range of motion.     Cervical back: Normal range of motion.  Lymphadenopathy:     Cervical: No cervical adenopathy.  Skin:    General: Skin is warm and dry.     Findings: No erythema or rash.  Neurological:     Mental Status: She is alert and  oriented to person, place, and time.  Psychiatric:        Behavior: Behavior normal.        Thought Content: Thought content normal.        Judgment: Judgment normal.     Lab Results  Component Value Date   WBC 10.7 (H) 02/05/2021   HGB 13.4 02/05/2021   HCT 38.9 02/05/2021   MCV 92.0 02/05/2021   PLT 285 02/05/2021     Chemistry      Component Value Date/Time   NA 140 11/07/2020 0747   NA 140 09/17/2017 1507   K 4.3 11/07/2020 0747   K 3.9 09/17/2017 1507   CL 101 11/07/2020 0747   CL 99 04/17/2017 1411   CO2 32 11/07/2020 0747   CO2 30 (H) 09/17/2017 1507   BUN 10 11/07/2020 0747   BUN 8.9 09/17/2017 1507   CREATININE 1.00 11/07/2020 0747   CREATININE 1.2 (H) 09/17/2017 1507      Component Value Date/Time   CALCIUM 9.9 11/07/2020 0747   CALCIUM 9.3 09/17/2017 1507   ALKPHOS 104 11/07/2020 0747   ALKPHOS 105 09/17/2017 1507   AST 15 11/07/2020 0747   AST 15 09/17/2017 1507   ALT 19 11/07/2020 0747   ALT 18 09/17/2017 1507   BILITOT 0.5 11/07/2020 0747   BILITOT 0.38 09/17/2017 1507       Impression and Plan: Ms. Koffman is a 60 year old white female. She has a history of relapsed chronic immune thrombocytopenia.   I looked at her blood under the microscope.  She had some Howell-Jolly bodies with some of the red cells.  She has adequate platelets now.  Platelets are somewhat large and well granulated.  We will still plan on holding the Nplate.  I think we have a lot of "flexibility" when we have to give it.  I would like to make sure we get her back before her dental implant procedure in June.  [name], MD 4/19/20228:29 AM

## 2021-02-09 DIAGNOSIS — H524 Presbyopia: Secondary | ICD-10-CM | POA: Diagnosis not present

## 2021-03-27 ENCOUNTER — Encounter: Payer: Self-pay | Admitting: Hematology & Oncology

## 2021-03-27 ENCOUNTER — Inpatient Hospital Stay (HOSPITAL_BASED_OUTPATIENT_CLINIC_OR_DEPARTMENT_OTHER): Payer: 59 | Admitting: Hematology & Oncology

## 2021-03-27 ENCOUNTER — Inpatient Hospital Stay: Payer: 59 | Attending: Hematology & Oncology

## 2021-03-27 ENCOUNTER — Other Ambulatory Visit: Payer: Self-pay

## 2021-03-27 VITALS — BP 151/60 | HR 78 | Temp 97.7°F | Resp 18 | Wt 190.0 lb

## 2021-03-27 DIAGNOSIS — D693 Immune thrombocytopenic purpura: Secondary | ICD-10-CM

## 2021-03-27 LAB — CBC WITH DIFFERENTIAL (CANCER CENTER ONLY)
Abs Immature Granulocytes: 0.04 10*3/uL (ref 0.00–0.07)
Basophils Absolute: 0.1 10*3/uL (ref 0.0–0.1)
Basophils Relative: 1 %
Eosinophils Absolute: 0 10*3/uL (ref 0.0–0.5)
Eosinophils Relative: 0 %
HCT: 38.9 % (ref 36.0–46.0)
Hemoglobin: 13.5 g/dL (ref 12.0–15.0)
Immature Granulocytes: 0 %
Lymphocytes Relative: 31 %
Lymphs Abs: 3.4 10*3/uL (ref 0.7–4.0)
MCH: 32.2 pg (ref 26.0–34.0)
MCHC: 34.7 g/dL (ref 30.0–36.0)
MCV: 92.8 fL (ref 80.0–100.0)
Monocytes Absolute: 0.9 10*3/uL (ref 0.1–1.0)
Monocytes Relative: 8 %
Neutro Abs: 6.5 10*3/uL (ref 1.7–7.7)
Neutrophils Relative %: 60 %
Platelet Count: 225 10*3/uL (ref 150–400)
RBC: 4.19 MIL/uL (ref 3.87–5.11)
RDW: 13.4 % (ref 11.5–15.5)
WBC Count: 11 10*3/uL — ABNORMAL HIGH (ref 4.0–10.5)
nRBC: 0 % (ref 0.0–0.2)

## 2021-03-27 LAB — PLATELET BY CITRATE

## 2021-03-27 LAB — SAVE SMEAR(SSMR), FOR PROVIDER SLIDE REVIEW

## 2021-03-27 NOTE — Progress Notes (Signed)
Hematology and Oncology Follow Up Visit  Janice Martin [id number] April 21, 1961 60 y.o. 03/27/2021   Principle Diagnosis:   Chronic immune thrombocytopenia-recurrent  Submassive pulmonary embolism, with right lower extremity thrombus  Current Therapy:    Xarelto 10 mg by mouth daily   Decadron-taper per protocol for relapsed thrombocytopenia  Nplate q 3 weeks for platelet count < 200K -- change on 05/23/2020     Interim History:  [name] is back for followup.  Everything so far is going well.  She goes for her dental implants tomorrow.  Her platelet count is fantastic.  She is off the Xarelto.  I told her to restart the Xarelto on Saturday.  She has had no problems with bleeding.  Is been no problems with cough or shortness of breath.  She has had no issues with nausea or vomiting.  She has had no change in bowel or bladder habits.  There is been no rashes.  I know that she will do fine with respect to the dental implant procedure.  I know that she will feel a whole lot better after she has implants.  Overall, her performance status is ECOG 1.    Medications:  Current Outpatient Medications:  .  EPINEPHrine 0.3 mg/0.3 mL IJ SOAJ injection, Inject 0.3 mLs (0.3 mg total) into the muscle as needed for anaphylaxis., Disp: 1 each, Rfl: 2 .  ibuprofen (ADVIL) 600 MG tablet, TAKE 1 TABLET BY MOUTH EVERY 6 HOURS AS NEEDED FOR DENTAL PAIN, Disp: 30 tablet, Rfl: 0 .  nebivolol (BYSTOLIC) 5 MG tablet, TAKE 1 TABLET BY MOUTH ONCE A DAY (NEED OFFICE VISIT FOR FURTURE REFILLS), Disp: 90 tablet, Rfl: 1 .  penicillin v potassium (VEETID) 500 MG tablet, TAKE 1 TABLET BY MOUTH EVERY 6 HOURS UNTIL GONE, Disp: 30 tablet, Rfl: 0 .  rivaroxaban (XARELTO) 10 MG TABS tablet, TAKE 1 TABLET BY MOUTH ONCE DAILY WITH SUPPER, Disp: 90 tablet, Rfl: 3 .  valsartan-hydrochlorothiazide (DIOVAN-HCT) 320-25 MG tablet, TAKE 1 TABLET BY MOUTH DAILY., Disp: 90 tablet, Rfl: 1 .  vitamin B-12 (CYANOCOBALAMIN) 100 MCG  tablet, Take 100 mcg by mouth at bedtime. , Disp: , Rfl:  .  methylPREDNISolone (MEDROL DOSEPAK) 4 MG TBPK tablet, TAKE AS DIRECTED FOLLOW PACKAGE INSTRUCTIONS (Patient not taking: Reported on 03/27/2021), Disp: 21 tablet, Rfl: 0  Allergies:  Allergies  Allergen Reactions  . Bee Venom Anaphylaxis, Shortness Of Breath and Swelling  . Influenza Vaccines Other (See Comments)    Per pt can't take mess up with her immune system  . Morphine Other (See Comments)    Burns really bad  . Shellfish Allergy Anaphylaxis    Past Medical History, Surgical history, Social history, and Family History were reviewed and updated.  Review of Systems: Review of Systems  Constitutional: Negative.   HENT: Negative.   Eyes: Negative.   Respiratory: Negative.   Cardiovascular: Negative.   Gastrointestinal: Negative.   Genitourinary: Negative.   Musculoskeletal: Negative.   Skin: Negative.   Neurological: Negative.   Endo/Heme/Allergies: Negative.   Psychiatric/Behavioral: Negative.     Physical Exam:  weight is 86.2 kg. Her oral temperature is 97.7 F (36.5 C). Her blood pressure is 151/60 (abnormal) and her pulse is 78. Her respiration is 18 and oxygen saturation is 100%.   Physical Exam Vitals reviewed.  HENT:     Head: Normocephalic and atraumatic.  Eyes:     Pupils: Pupils are equal, round, and reactive to light.  Cardiovascular:  Rate and Rhythm: Normal rate and regular rhythm.     Heart sounds: Normal heart sounds.  Pulmonary:     Effort: Pulmonary effort is normal.     Breath sounds: Normal breath sounds.  Abdominal:     General: Bowel sounds are normal.     Palpations: Abdomen is soft.     Comments: She has well-healed splenectomy scar.  No fluid wave.  No palpable liver.  No guarding or rebound tenderness.  Musculoskeletal:        General: No tenderness or deformity. Normal range of motion.     Cervical back: Normal range of motion.  Lymphadenopathy:     Cervical: No cervical  adenopathy.  Skin:    General: Skin is warm and dry.     Findings: No erythema or rash.  Neurological:     Mental Status: She is alert and oriented to person, place, and time.  Psychiatric:        Behavior: Behavior normal.        Thought Content: Thought content normal.        Judgment: Judgment normal.     Lab Results  Component Value Date   WBC 11.0 (H) 03/27/2021   HGB 13.5 03/27/2021   HCT 38.9 03/27/2021   MCV 92.8 03/27/2021   PLT 225 03/27/2021     Chemistry      Component Value Date/Time   NA 140 02/05/2021 0737   NA 140 09/17/2017 1507   K 4.2 02/05/2021 0737   K 3.9 09/17/2017 1507   CL 100 02/05/2021 0737   CL 99 04/17/2017 1411   CO2 32 02/05/2021 0737   CO2 30 (H) 09/17/2017 1507   BUN 10 02/05/2021 0737   BUN 8.9 09/17/2017 1507   CREATININE 1.00 02/05/2021 0737   CREATININE 1.2 (H) 09/17/2017 1507      Component Value Date/Time   CALCIUM 9.8 02/05/2021 0737   CALCIUM 9.3 09/17/2017 1507   ALKPHOS 105 02/05/2021 0737   ALKPHOS 105 09/17/2017 1507   AST 16 02/05/2021 0737   AST 15 09/17/2017 1507   ALT 18 02/05/2021 0737   ALT 18 09/17/2017 1507   BILITOT 0.4 02/05/2021 0737   BILITOT 0.38 09/17/2017 1507       Impression and Plan: Janice Martin is a 60 year old white female. She has a history of relapsed chronic immune thrombocytopenia.   I looked at her blood under the microscope.  She had some Howell-Jolly bodies with some of the red cells.  She has adequate platelets.  Platelets are somewhat large and well granulated.  Again, I do not see any problems with her having the surgery tomorrow.  We will now get her back after Labor Day in September.  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  She really does a fantastic job and really makes life a lot easier for all of [place].  Janice Napoleon, MD 6/8/20228:40 AM

## 2021-06-27 ENCOUNTER — Encounter: Payer: Self-pay | Admitting: Hematology & Oncology

## 2021-06-27 ENCOUNTER — Inpatient Hospital Stay: Payer: 59 | Attending: Hematology & Oncology

## 2021-06-27 ENCOUNTER — Other Ambulatory Visit: Payer: Self-pay

## 2021-06-27 ENCOUNTER — Inpatient Hospital Stay (HOSPITAL_BASED_OUTPATIENT_CLINIC_OR_DEPARTMENT_OTHER): Payer: 59 | Admitting: Hematology & Oncology

## 2021-06-27 VITALS — BP 152/96 | HR 84 | Temp 98.3°F | Resp 20 | Wt 181.1 lb

## 2021-06-27 DIAGNOSIS — I825Z1 Chronic embolism and thrombosis of unspecified deep veins of right distal lower extremity: Secondary | ICD-10-CM | POA: Diagnosis not present

## 2021-06-27 DIAGNOSIS — Z7952 Long term (current) use of systemic steroids: Secondary | ICD-10-CM | POA: Diagnosis not present

## 2021-06-27 DIAGNOSIS — D693 Immune thrombocytopenic purpura: Secondary | ICD-10-CM | POA: Insufficient documentation

## 2021-06-27 DIAGNOSIS — Z7901 Long term (current) use of anticoagulants: Secondary | ICD-10-CM | POA: Diagnosis not present

## 2021-06-27 DIAGNOSIS — I2699 Other pulmonary embolism without acute cor pulmonale: Secondary | ICD-10-CM | POA: Diagnosis not present

## 2021-06-27 LAB — CBC WITH DIFFERENTIAL (CANCER CENTER ONLY)
Abs Immature Granulocytes: 0.03 10*3/uL (ref 0.00–0.07)
Basophils Absolute: 0.1 10*3/uL (ref 0.0–0.1)
Basophils Relative: 2 %
Eosinophils Absolute: 0 10*3/uL (ref 0.0–0.5)
Eosinophils Relative: 0 %
HCT: 39.7 % (ref 36.0–46.0)
Hemoglobin: 13.6 g/dL (ref 12.0–15.0)
Immature Granulocytes: 0 %
Lymphocytes Relative: 35 %
Lymphs Abs: 3.1 10*3/uL (ref 0.7–4.0)
MCH: 31.8 pg (ref 26.0–34.0)
MCHC: 34.3 g/dL (ref 30.0–36.0)
MCV: 92.8 fL (ref 80.0–100.0)
Monocytes Absolute: 0.7 10*3/uL (ref 0.1–1.0)
Monocytes Relative: 8 %
Neutro Abs: 4.9 10*3/uL (ref 1.7–7.7)
Neutrophils Relative %: 55 %
Platelet Count: 299 10*3/uL (ref 150–400)
RBC: 4.28 MIL/uL (ref 3.87–5.11)
RDW: 12.8 % (ref 11.5–15.5)
WBC Count: 8.8 10*3/uL (ref 4.0–10.5)
nRBC: 0 % (ref 0.0–0.2)

## 2021-06-27 LAB — CMP (CANCER CENTER ONLY)
ALT: 15 U/L (ref 0–44)
AST: 13 U/L — ABNORMAL LOW (ref 15–41)
Albumin: 4.2 g/dL (ref 3.5–5.0)
Alkaline Phosphatase: 116 U/L (ref 38–126)
Anion gap: 8 (ref 5–15)
BUN: 14 mg/dL (ref 6–20)
CO2: 31 mmol/L (ref 22–32)
Calcium: 9.4 mg/dL (ref 8.9–10.3)
Chloride: 101 mmol/L (ref 98–111)
Creatinine: 1.05 mg/dL — ABNORMAL HIGH (ref 0.44–1.00)
GFR, Estimated: >60 mL/min (ref >60)
Glucose, Bld: 106 mg/dL — ABNORMAL HIGH (ref 70–99)
Potassium: 4.1 mmol/L (ref 3.5–5.1)
Sodium: 140 mmol/L (ref 135–145)
Total Bilirubin: 0.3 mg/dL (ref 0.3–1.2)
Total Protein: 7.1 g/dL (ref 6.5–8.1)

## 2021-06-27 LAB — SAVE SMEAR(SSMR), FOR PROVIDER SLIDE REVIEW

## 2021-06-27 NOTE — Progress Notes (Signed)
Hematology and Oncology Follow Up Visit  Janice Martin [id number] 1961-07-03 60 y.o. 06/27/2021   Principle Diagnosis:  Chronic immune thrombocytopenia-recurrent Submassive pulmonary embolism, with right lower extremity thrombus  Current Therapy:   Xarelto 10 mg by mouth daily  Decadron-taper per protocol for relapsed thrombocytopenia Nplate q 3 weeks for platelet count < 200K -- change on 05/23/2020     Interim History:  [name] is back for followup.  She is doing fantastic.  She had a wonderful Labor Day weekend.  Her dental implants are doing nicely.  She does not have any oral pain.  She goes back in December to have the permanent ones placed.  It is been a year and a half that she has had Nplate.  She is done incredibly well with out having to have a Nplate.  There is been no bleeding.  She is on Xarelto chronically.  She has had no problems with bowels or bladder.  Her appetite is good.  She is losing some weight.  She is exercising a little bit more.  She has had no rashes.  There is been no leg swelling.  She has had no cough or shortness of breath.  There has been no issues with COVID.  Overall, her performance status is ECOG 1.    Medications:  Current Outpatient Medications:    ibuprofen (ADVIL) 600 MG tablet, TAKE 1 TABLET BY MOUTH EVERY 6 HOURS AS NEEDED FOR DENTAL PAIN, Disp: 30 tablet, Rfl: 0   nebivolol (BYSTOLIC) 5 MG tablet, TAKE 1 TABLET BY MOUTH ONCE A DAY (NEED OFFICE VISIT FOR FURTURE REFILLS), Disp: 90 tablet, Rfl: 1   rivaroxaban (XARELTO) 10 MG TABS tablet, TAKE 1 TABLET BY MOUTH ONCE DAILY WITH SUPPER, Disp: 90 tablet, Rfl: 3   valsartan-hydrochlorothiazide (DIOVAN-HCT) 320-25 MG tablet, TAKE 1 TABLET BY MOUTH DAILY., Disp: 90 tablet, Rfl: 1   vitamin B-12 (CYANOCOBALAMIN) 100 MCG tablet, Take 100 mcg by mouth at bedtime. , Disp: , Rfl:    EPINEPHrine 0.3 mg/0.3 mL IJ SOAJ injection, Inject 0.3 mLs (0.3 mg total) into the muscle as needed for  anaphylaxis. (Patient not taking: Reported on 06/27/2021), Disp: 1 each, Rfl: 2  Allergies:  Allergies  Allergen Reactions   Bee Venom Anaphylaxis, Shortness Of Breath and Swelling   Influenza Vaccines Other (See Comments)    Per pt can't take mess up with her immune system   Morphine Other (See Comments)    Burns really bad   Shellfish Allergy Anaphylaxis    Past Medical History, Surgical history, Social history, and Family History were reviewed and updated.  Review of Systems: Review of Systems  Constitutional: Negative.   HENT: Negative.    Eyes: Negative.   Respiratory: Negative.    Cardiovascular: Negative.   Gastrointestinal: Negative.   Genitourinary: Negative.   Musculoskeletal: Negative.   Skin: Negative.   Neurological: Negative.   Endo/Heme/Allergies: Negative.   Psychiatric/Behavioral: Negative.     Physical Exam:  weight is 181 lb 1.9 oz (82.2 kg). Her oral temperature is 98.3 F (36.8 C). Her blood pressure is 152/96 (abnormal) and her pulse is 84. Her respiration is 20.   Physical Exam Vitals reviewed.  HENT:     Head: Normocephalic and atraumatic.  Eyes:     Pupils: Pupils are equal, round, and reactive to light.  Cardiovascular:     Rate and Rhythm: Normal rate and regular rhythm.     Heart sounds: Normal heart sounds.  Pulmonary:  Effort: Pulmonary effort is normal.     Breath sounds: Normal breath sounds.  Abdominal:     General: Bowel sounds are normal.     Palpations: Abdomen is soft.     Comments: She has well-healed splenectomy scar.  No fluid wave.  No palpable liver.  No guarding or rebound tenderness.  Musculoskeletal:        General: No tenderness or deformity. Normal range of motion.     Cervical back: Normal range of motion.  Lymphadenopathy:     Cervical: No cervical adenopathy.  Skin:    General: Skin is warm and dry.     Findings: No erythema or rash.  Neurological:     Mental Status: She is alert and oriented to person,  place, and time.  Psychiatric:        Behavior: Behavior normal.        Thought Content: Thought content normal.        Judgment: Judgment normal.    Lab Results  Component Value Date   WBC 8.8 06/27/2021   HGB 13.6 06/27/2021   HCT 39.7 06/27/2021   MCV 92.8 06/27/2021   PLT 299 06/27/2021     Chemistry      Component Value Date/Time   NA 140 02/05/2021 0737   NA 140 09/17/2017 1507   K 4.2 02/05/2021 0737   K 3.9 09/17/2017 1507   CL 100 02/05/2021 0737   CL 99 04/17/2017 1411   CO2 32 02/05/2021 0737   CO2 30 (H) 09/17/2017 1507   BUN 10 02/05/2021 0737   BUN 8.9 09/17/2017 1507   CREATININE 1.00 02/05/2021 0737   CREATININE 1.2 (H) 09/17/2017 1507      Component Value Date/Time   CALCIUM 9.8 02/05/2021 0737   CALCIUM 9.3 09/17/2017 1507   ALKPHOS 105 02/05/2021 0737   ALKPHOS 105 09/17/2017 1507   AST 16 02/05/2021 0737   AST 15 09/17/2017 1507   ALT 18 02/05/2021 0737   ALT 18 09/17/2017 1507   BILITOT 0.4 02/05/2021 0737   BILITOT 0.38 09/17/2017 1507       Impression and Plan: Janice Martin is a 60 year old white female. She has a history of relapsed chronic immune thrombocytopenia.   I looked at her blood under the microscope.  She had some Howell-Jolly bodies with some of the red cells.  She has adequate platelets.  Platelets are somewhat large and well granulated.  I really think that everything is going so nicely for her.  I think because she is doing so well, we can get her back in 6 months now.  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  [name], MD 9/8/20228:18 AM

## 2021-07-14 MED FILL — Nebivolol HCl Tab 5 MG (Base Equivalent): ORAL | 90 days supply | Qty: 90 | Fill #0 | Status: AC

## 2021-07-14 MED FILL — Valsartan-Hydrochlorothiazide Tab 320-25 MG: ORAL | 90 days supply | Qty: 90 | Fill #0 | Status: AC

## 2021-07-15 ENCOUNTER — Other Ambulatory Visit (HOSPITAL_COMMUNITY): Payer: Self-pay

## 2021-09-23 ENCOUNTER — Other Ambulatory Visit (HOSPITAL_BASED_OUTPATIENT_CLINIC_OR_DEPARTMENT_OTHER): Payer: Self-pay

## 2021-09-23 MED ORDER — CARESTART COVID-19 HOME TEST VI KIT
PACK | 0 refills | Status: DC
Start: 2021-09-23 — End: 2021-12-25
  Filled 2021-09-23: qty 2, 4d supply, fill #0

## 2021-12-25 ENCOUNTER — Other Ambulatory Visit (HOSPITAL_BASED_OUTPATIENT_CLINIC_OR_DEPARTMENT_OTHER): Payer: Self-pay

## 2021-12-25 ENCOUNTER — Other Ambulatory Visit: Payer: Self-pay | Admitting: *Deleted

## 2021-12-25 ENCOUNTER — Other Ambulatory Visit: Payer: Self-pay

## 2021-12-25 ENCOUNTER — Inpatient Hospital Stay: Payer: 59 | Attending: Hematology & Oncology

## 2021-12-25 ENCOUNTER — Inpatient Hospital Stay: Payer: 59 | Admitting: Hematology & Oncology

## 2021-12-25 ENCOUNTER — Encounter: Payer: Self-pay | Admitting: Hematology & Oncology

## 2021-12-25 VITALS — BP 177/87 | HR 86 | Temp 98.3°F | Resp 18 | Ht 64.17 in | Wt 178.4 lb

## 2021-12-25 DIAGNOSIS — I2699 Other pulmonary embolism without acute cor pulmonale: Secondary | ICD-10-CM | POA: Diagnosis not present

## 2021-12-25 DIAGNOSIS — D693 Immune thrombocytopenic purpura: Secondary | ICD-10-CM

## 2021-12-25 DIAGNOSIS — I2692 Saddle embolus of pulmonary artery without acute cor pulmonale: Secondary | ICD-10-CM

## 2021-12-25 DIAGNOSIS — I2602 Saddle embolus of pulmonary artery with acute cor pulmonale: Secondary | ICD-10-CM

## 2021-12-25 DIAGNOSIS — I825Z2 Chronic embolism and thrombosis of unspecified deep veins of left distal lower extremity: Secondary | ICD-10-CM

## 2021-12-25 DIAGNOSIS — I825Z1 Chronic embolism and thrombosis of unspecified deep veins of right distal lower extremity: Secondary | ICD-10-CM | POA: Diagnosis not present

## 2021-12-25 DIAGNOSIS — Z7952 Long term (current) use of systemic steroids: Secondary | ICD-10-CM | POA: Diagnosis not present

## 2021-12-25 DIAGNOSIS — Z7901 Long term (current) use of anticoagulants: Secondary | ICD-10-CM | POA: Diagnosis not present

## 2021-12-25 DIAGNOSIS — I2782 Chronic pulmonary embolism: Secondary | ICD-10-CM

## 2021-12-25 LAB — CMP (CANCER CENTER ONLY)
ALT: 12 U/L (ref 0–44)
AST: 13 U/L — ABNORMAL LOW (ref 15–41)
Albumin: 4 g/dL (ref 3.5–5.0)
Alkaline Phosphatase: 106 U/L (ref 38–126)
Anion gap: 7 (ref 5–15)
BUN: 12 mg/dL (ref 6–20)
CO2: 32 mmol/L (ref 22–32)
Calcium: 9.4 mg/dL (ref 8.9–10.3)
Chloride: 101 mmol/L (ref 98–111)
Creatinine: 1.03 mg/dL — ABNORMAL HIGH (ref 0.44–1.00)
GFR, Estimated: >60 mL/min (ref >60)
Glucose, Bld: 95 mg/dL (ref 70–99)
Potassium: 4.7 mmol/L (ref 3.5–5.1)
Sodium: 140 mmol/L (ref 135–145)
Total Bilirubin: 0.4 mg/dL (ref 0.3–1.2)
Total Protein: 7.2 g/dL (ref 6.5–8.1)

## 2021-12-25 LAB — CBC WITH DIFFERENTIAL (CANCER CENTER ONLY)
Abs Immature Granulocytes: 0.05 10*3/uL (ref 0.00–0.07)
Basophils Absolute: 0.1 10*3/uL (ref 0.0–0.1)
Basophils Relative: 1 %
Eosinophils Absolute: 0 10*3/uL (ref 0.0–0.5)
Eosinophils Relative: 0 %
HCT: 40.4 % (ref 36.0–46.0)
Hemoglobin: 14 g/dL (ref 12.0–15.0)
Immature Granulocytes: 1 %
Lymphocytes Relative: 31 %
Lymphs Abs: 2.9 10*3/uL (ref 0.7–4.0)
MCH: 31.7 pg (ref 26.0–34.0)
MCHC: 34.7 g/dL (ref 30.0–36.0)
MCV: 91.4 fL (ref 80.0–100.0)
Monocytes Absolute: 0.8 10*3/uL (ref 0.1–1.0)
Monocytes Relative: 9 %
Neutro Abs: 5.4 10*3/uL (ref 1.7–7.7)
Neutrophils Relative %: 58 %
Platelet Count: 291 10*3/uL (ref 150–400)
RBC: 4.42 MIL/uL (ref 3.87–5.11)
RDW: 13.2 % (ref 11.5–15.5)
WBC Count: 9.3 10*3/uL (ref 4.0–10.5)
nRBC: 0 % (ref 0.0–0.2)

## 2021-12-25 LAB — SAVE SMEAR(SSMR), FOR PROVIDER SLIDE REVIEW

## 2021-12-25 MED ORDER — RIVAROXABAN 10 MG PO TABS
ORAL_TABLET | Freq: Every day | ORAL | 3 refills | Status: DC
  Filled 2021-12-25: qty 90, 90d supply, fill #0

## 2021-12-25 NOTE — Progress Notes (Signed)
Hematology and Oncology Follow Up Visit ? ?Janice Martin ?[id number] ?August 03, 1961 61 y.o. ?12/25/2021 ? ? ?Principle Diagnosis:  ?Chronic immune thrombocytopenia-recurrent ?Submassive pulmonary embolism, with right lower extremity thrombus ? ?Current Therapy:   ?Xarelto 10 mg by mouth daily ? Decadron-taper per protocol for relapsed thrombocytopenia ?Nplate q 3 weeks for platelet count < 200K -- change on 05/23/2020 ?    ?Interim History:  [name] is back for followup.  We see her every 6 months.  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  She does a great job for [place]. ? ?She has had no problems since we last saw her.  She had no problems over the holidays. ? ?Her dental implants look great.  She is really enjoying having them.  It is made her life somewhat easier. ? ?There is been no problems with bleeding.  She is on Xarelto.  She has had no problems with fever.  She has had no COVID issues. ? ?There is no change in bowel or bladder habits.  She has had no rashes.  There is been no leg swelling..  She is losing weight.  She is trying to lose weight.  We are very proud of her. ? ?Overall, her performance status is ECOG 0.   ? ?Medications:  ?Current Outpatient Medications:  ?  nebivolol (BYSTOLIC) 5 MG tablet, TAKE 1 TABLET BY MOUTH ONCE A DAY (NEED OFFICE VISIT FOR FURTURE REFILLS), Disp: 90 tablet, Rfl: 1 ?  rivaroxaban (XARELTO) 10 MG TABS tablet, TAKE 1 TABLET BY MOUTH ONCE DAILY WITH SUPPER, Disp: 90 tablet, Rfl: 3 ?  valsartan-hydrochlorothiazide (DIOVAN-HCT) 320-25 MG tablet, TAKE 1 TABLET BY MOUTH DAILY., Disp: 90 tablet, Rfl: 1 ?  vitamin B-12 (CYANOCOBALAMIN) 100 MCG tablet, Take 100 mcg by mouth at bedtime. , Disp: , Rfl:  ?  EPINEPHrine 0.3 mg/0.3 mL IJ SOAJ injection, Inject 0.3 mLs (0.3 mg total) into the muscle as needed for anaphylaxis. (Patient not taking: Reported on 06/27/2021), Disp: 1 each, Rfl: 2 ? ?Allergies:  ?Allergies  ?Allergen Reactions  ? Bee Venom  Anaphylaxis, Shortness Of Breath and Swelling  ? Influenza Vaccines Other (See Comments)  ?  Per pt can't take mess up with her immune system  ? Morphine Other (See Comments)  ?  Burns really bad  ? Shellfish Allergy Anaphylaxis  ? ? ?Past Medical History, Surgical history, Social history, and Family History were reviewed and updated. ? ?Review of Systems: ?Review of Systems  ?Constitutional: Negative.   ?HENT: Negative.    ?Eyes: Negative.   ?Respiratory: Negative.    ?Cardiovascular: Negative.   ?Gastrointestinal: Negative.   ?Genitourinary: Negative.   ?Musculoskeletal: Negative.   ?Skin: Negative.   ?Neurological: Negative.   ?Endo/Heme/Allergies: Negative.   ?Psychiatric/Behavioral: Negative.    ? ?Physical Exam: ? vitals were not taken for this visit.  ? ?Physical Exam ?Vitals reviewed.  ?HENT:  ?   Head: Normocephalic and atraumatic.  ?Eyes:  ?   Pupils: Pupils are equal, round, and reactive to light.  ?Cardiovascular:  ?   Rate and Rhythm: Normal rate and regular rhythm.  ?   Heart sounds: Normal heart sounds.  ?Pulmonary:  ?   Effort: Pulmonary effort is normal.  ?   Breath sounds: Normal breath sounds.  ?Abdominal:  ?   General: Bowel sounds are normal.  ?   Palpations: Abdomen is soft.  ?   Comments: She has well-healed splenectomy scar.  No fluid  wave.  No palpable liver.  No guarding or rebound tenderness.  ?Musculoskeletal:     ?   General: No tenderness or deformity. Normal range of motion.  ?   Cervical back: Normal range of motion.  ?Lymphadenopathy:  ?   Cervical: No cervical adenopathy.  ?Skin: ?   General: Skin is warm and dry.  ?   Findings: No erythema or rash.  ?Neurological:  ?   Mental Status: She is alert and oriented to person, place, and time.  ?Psychiatric:     ?   Behavior: Behavior normal.     ?   Thought Content: Thought content normal.     ?   Judgment: Judgment normal.  ? ? ?Lab Results  ?Component Value Date  ? WBC 9.3 12/25/2021  ? HGB 14.0 12/25/2021  ? HCT 40.4 12/25/2021  ? MCV  91.4 12/25/2021  ? PLT 291 12/25/2021  ? ?  Chemistry   ?   ?Component Value Date/Time  ? NA 140 06/27/2021 0746  ? NA 140 09/17/2017 1507  ? K 4.1 06/27/2021 0746  ? K 3.9 09/17/2017 1507  ? CL 101 06/27/2021 0746  ? CL 99 04/17/2017 1411  ? CO2 31 06/27/2021 0746  ? CO2 30 (H) 09/17/2017 1507  ? BUN 14 06/27/2021 0746  ? BUN 8.9 09/17/2017 1507  ? CREATININE 1.05 (H) 06/27/2021 0746  ? CREATININE 1.2 (H) 09/17/2017 1507  ?    ?Component Value Date/Time  ? CALCIUM 9.4 06/27/2021 0746  ? CALCIUM 9.3 09/17/2017 1507  ? ALKPHOS 116 06/27/2021 0746  ? ALKPHOS 105 09/17/2017 1507  ? AST 13 (L) 06/27/2021 0746  ? AST 15 09/17/2017 1507  ? ALT 15 06/27/2021 0746  ? ALT 18 09/17/2017 1507  ? BILITOT 0.3 06/27/2021 0746  ? BILITOT 0.38 09/17/2017 1507  ?  ? ? ? ?Impression and Plan: ?Janice Martin is a 61 year old white female. She has a history of relapsed chronic immune thrombocytopenia.  ? ?I looked at her blood under the microscope.  She had some Howell-Jolly bodies with some of the red cells.  She has adequate platelets.  Platelets are somewhat large and well granulated. ? 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  I think this would be reasonable.  She will certainly let us know if there is any problem. ? ? ?Janice Napoleon, MD ?3/8/20238:02 AM ? ?

## 2021-12-26 ENCOUNTER — Other Ambulatory Visit (HOSPITAL_BASED_OUTPATIENT_CLINIC_OR_DEPARTMENT_OTHER): Payer: Self-pay

## 2021-12-27 ENCOUNTER — Other Ambulatory Visit (HOSPITAL_BASED_OUTPATIENT_CLINIC_OR_DEPARTMENT_OTHER): Payer: Self-pay

## 2022-03-08 DIAGNOSIS — H524 Presbyopia: Secondary | ICD-10-CM | POA: Diagnosis not present

## 2022-10-02 ENCOUNTER — Encounter: Payer: Self-pay | Admitting: Hematology & Oncology

## 2022-10-04 ENCOUNTER — Encounter: Payer: Self-pay | Admitting: Hematology & Oncology

## 2022-12-26 ENCOUNTER — Encounter: Payer: Self-pay | Admitting: Hematology & Oncology

## 2022-12-26 ENCOUNTER — Other Ambulatory Visit: Payer: Self-pay

## 2022-12-26 ENCOUNTER — Inpatient Hospital Stay: Payer: Commercial Managed Care - PPO | Admitting: Hematology & Oncology

## 2022-12-26 ENCOUNTER — Inpatient Hospital Stay: Payer: Commercial Managed Care - PPO | Attending: Hematology & Oncology

## 2022-12-26 VITALS — BP 149/77 | HR 83 | Temp 98.1°F | Resp 16 | Wt 190.0 lb

## 2022-12-26 DIAGNOSIS — Z86711 Personal history of pulmonary embolism: Secondary | ICD-10-CM | POA: Diagnosis not present

## 2022-12-26 DIAGNOSIS — Z7901 Long term (current) use of anticoagulants: Secondary | ICD-10-CM | POA: Insufficient documentation

## 2022-12-26 DIAGNOSIS — D693 Immune thrombocytopenic purpura: Secondary | ICD-10-CM | POA: Diagnosis not present

## 2022-12-26 DIAGNOSIS — I825Z1 Chronic embolism and thrombosis of unspecified deep veins of right distal lower extremity: Secondary | ICD-10-CM

## 2022-12-26 DIAGNOSIS — Z79899 Other long term (current) drug therapy: Secondary | ICD-10-CM | POA: Diagnosis not present

## 2022-12-26 LAB — CBC WITH DIFFERENTIAL (CANCER CENTER ONLY)
Abs Immature Granulocytes: 0.03 10*3/uL (ref 0.00–0.07)
Basophils Absolute: 0.1 10*3/uL (ref 0.0–0.1)
Basophils Relative: 1 %
Eosinophils Absolute: 0.2 10*3/uL (ref 0.0–0.5)
Eosinophils Relative: 2 %
HCT: 42.1 % (ref 36.0–46.0)
Hemoglobin: 14.2 g/dL (ref 12.0–15.0)
Immature Granulocytes: 0 %
Lymphocytes Relative: 32 %
Lymphs Abs: 3.4 10*3/uL (ref 0.7–4.0)
MCH: 31.8 pg (ref 26.0–34.0)
MCHC: 33.7 g/dL (ref 30.0–36.0)
MCV: 94.2 fL (ref 80.0–100.0)
Monocytes Absolute: 0.9 10*3/uL (ref 0.1–1.0)
Monocytes Relative: 8 %
Neutro Abs: 6 10*3/uL (ref 1.7–7.7)
Neutrophils Relative %: 57 %
Platelet Count: 230 10*3/uL (ref 150–400)
RBC: 4.47 MIL/uL (ref 3.87–5.11)
RDW: 13.2 % (ref 11.5–15.5)
WBC Count: 10.6 10*3/uL — ABNORMAL HIGH (ref 4.0–10.5)
nRBC: 0 % (ref 0.0–0.2)

## 2022-12-26 LAB — CMP (CANCER CENTER ONLY)
ALT: 13 U/L (ref 0–44)
AST: 12 U/L — ABNORMAL LOW (ref 15–41)
Albumin: 4.6 g/dL (ref 3.5–5.0)
Alkaline Phosphatase: 102 U/L (ref 38–126)
Anion gap: 10 (ref 5–15)
BUN: 15 mg/dL (ref 6–20)
CO2: 30 mmol/L (ref 22–32)
Calcium: 9.9 mg/dL (ref 8.9–10.3)
Chloride: 101 mmol/L (ref 98–111)
Creatinine: 1.11 mg/dL — ABNORMAL HIGH (ref 0.44–1.00)
GFR, Estimated: 57 mL/min — ABNORMAL LOW (ref >60)
Glucose, Bld: 124 mg/dL — ABNORMAL HIGH (ref 70–99)
Potassium: 4.5 mmol/L (ref 3.5–5.1)
Sodium: 141 mmol/L (ref 135–145)
Total Bilirubin: 0.5 mg/dL (ref 0.3–1.2)
Total Protein: 8.1 g/dL (ref 6.5–8.1)

## 2022-12-26 LAB — SAVE SMEAR(SSMR), FOR PROVIDER SLIDE REVIEW

## 2022-12-26 NOTE — Progress Notes (Signed)
Hematology and Oncology Follow Up Visit  AMEINA NEPPER [id number] January 17, 1961 62 y.o. 12/26/2022   Principle Diagnosis:  Chronic immune thrombocytopenia-recurrent Submassive pulmonary embolism, with right lower extremity thrombus  Current Therapy:   Xarelto 10 mg by mouth daily  Decadron-taper per protocol for relapsed thrombocytopenia Nplate q 3 weeks for platelet count < 200K -- change on 05/23/2020     Interim History:  Ms.  Colone is back for followup.  We now see her every year.  She is doing great.  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  She is doing well healthwise.  She has had no problems with Xarelto.  There have been no issues with thromboembolic disease.  She has had no bleeding or bruising.  Her platelet count has been holding nice and steady.  She has had no issues with COVID.  The big news is that a fourth grandchild will be coming in June.  This will be a girl.  She has had no rashes.  There is been no swollen lymph nodes.  She has had no nausea or vomiting.  There is been no change in bowel or bladder habits.  Overall, I would say that her performance status is probably ECOG 0.    Medications:  Current Outpatient Medications:    EPINEPHrine 0.3 mg/0.3 mL IJ SOAJ injection, Inject 0.3 mLs (0.3 mg total) into the muscle as needed for anaphylaxis. (Patient not taking: Reported on 06/27/2021), Disp: 1 each, Rfl: 2   nebivolol (BYSTOLIC) 5 MG tablet, TAKE 1 TABLET BY MOUTH ONCE A DAY (NEED OFFICE VISIT FOR FURTURE REFILLS), Disp: 90 tablet, Rfl: 1   rivaroxaban (XARELTO) 10 MG TABS tablet, TAKE 1 TABLET BY MOUTH ONCE DAILY WITH SUPPER, Disp: 90 tablet, Rfl: 3   valsartan-hydrochlorothiazide (DIOVAN-HCT) 320-25 MG tablet, TAKE 1 TABLET BY MOUTH DAILY., Disp: 90 tablet, Rfl: 1   vitamin B-12 (CYANOCOBALAMIN) 100 MCG tablet, Take 100 mcg by mouth at bedtime. , Disp: , Rfl:   Allergies:  Allergies  Allergen Reactions    Bee Venom Anaphylaxis, Shortness Of Breath and Swelling   Influenza Vaccines Other (See Comments)    Per pt can't take mess up with her immune system   Morphine Other (See Comments)    Burns really bad   Shellfish Allergy Anaphylaxis    Past Medical History, Surgical history, Social history, and Family History were reviewed and updated.  Review of Systems: Review of Systems  Constitutional: Negative.   HENT: Negative.    Eyes: Negative.   Respiratory: Negative.    Cardiovascular: Negative.   Gastrointestinal: Negative.   Genitourinary: Negative.   Musculoskeletal: Negative.   Skin: Negative.   Neurological: Negative.   Endo/Heme/Allergies: Negative.   Psychiatric/Behavioral: Negative.      Physical Exam:  weight is 190 lb (86.2 kg). Her oral temperature is 98.1 F (36.7 C). Her blood pressure is 149/77 (abnormal) and her pulse is 83. Her respiration is 16 and oxygen saturation is 100%.   Physical Exam Vitals reviewed.  HENT:     Head: Normocephalic and atraumatic.  Eyes:     Pupils: Pupils are equal, round, and reactive to light.  Cardiovascular:     Rate and Rhythm: Normal rate and regular rhythm.     Heart sounds: Normal heart sounds.  Pulmonary:     Effort: Pulmonary effort is normal.     Breath sounds: Normal breath sounds.  Abdominal:     General:  Bowel sounds are normal.     Palpations: Abdomen is soft.     Comments: She has well-healed splenectomy scar.  No fluid wave.  No palpable liver.  No guarding or rebound tenderness.  Musculoskeletal:        General: No tenderness or deformity. Normal range of motion.     Cervical back: Normal range of motion.  Lymphadenopathy:     Cervical: No cervical adenopathy.  Skin:    General: Skin is warm and dry.     Findings: No erythema or rash.  Neurological:     Mental Status: She is alert and oriented to person, place, and time.  Psychiatric:        Behavior: Behavior normal.        Thought Content: Thought  content normal.        Judgment: Judgment normal.     Lab Results  Component Value Date   WBC 10.6 (H) 12/26/2022   HGB 14.2 12/26/2022   HCT 42.1 12/26/2022   MCV 94.2 12/26/2022   PLT 230 12/26/2022     Chemistry      Component Value Date/Time   NA 140 12/25/2021 0740   NA 140 09/17/2017 1507   K 4.7 12/25/2021 0740   K 3.9 09/17/2017 1507   CL 101 12/25/2021 0740   CL 99 04/17/2017 1411   CO2 32 12/25/2021 0740   CO2 30 (H) 09/17/2017 1507   BUN 12 12/25/2021 0740   BUN 8.9 09/17/2017 1507   CREATININE 1.03 (H) 12/25/2021 0740   CREATININE 1.2 (H) 09/17/2017 1507      Component Value Date/Time   CALCIUM 9.4 12/25/2021 0740   CALCIUM 9.3 09/17/2017 1507   ALKPHOS 106 12/25/2021 0740   ALKPHOS 105 09/17/2017 1507   AST 13 (L) 12/25/2021 0740   AST 15 09/17/2017 1507   ALT 12 12/25/2021 0740   ALT 18 09/17/2017 1507   BILITOT 0.4 12/25/2021 0740   BILITOT 0.38 09/17/2017 1507       Impression and Plan: Ms. Halsell is a 62 year old white female. She has a history of relapsed chronic immune thrombocytopenia.   I looked at her blood under the microscope.  She has some Howell-Jolly bodies with some of the red cells.  She has adequate platelets.  Platelets are somewhat large and well granulated.  We will plan to get her back in 1 year.  Obviously, if she has any problems, we can certainly see her sooner.  I am just so grateful that she works for [place].  She is such a wonderful person.  She has a strong faith.  Our patients really love seeing her and talking to her when they come into the office.    [name], MD 3/8/20248:21 AM

## 2023-03-26 ENCOUNTER — Other Ambulatory Visit (HOSPITAL_BASED_OUTPATIENT_CLINIC_OR_DEPARTMENT_OTHER): Payer: Self-pay

## 2023-03-26 ENCOUNTER — Encounter (HOSPITAL_BASED_OUTPATIENT_CLINIC_OR_DEPARTMENT_OTHER): Payer: Self-pay | Admitting: Emergency Medicine

## 2023-03-26 ENCOUNTER — Encounter: Payer: Self-pay | Admitting: Hematology & Oncology

## 2023-03-26 ENCOUNTER — Emergency Department (HOSPITAL_BASED_OUTPATIENT_CLINIC_OR_DEPARTMENT_OTHER)
Admission: EM | Admit: 2023-03-26 | Discharge: 2023-03-26 | Disposition: A | Discharge status: Home / Self Care | Payer: Commercial Managed Care - PPO | Attending: Emergency Medicine | Admitting: Emergency Medicine

## 2023-03-26 ENCOUNTER — Emergency Department (HOSPITAL_BASED_OUTPATIENT_CLINIC_OR_DEPARTMENT_OTHER): Payer: Commercial Managed Care - PPO

## 2023-03-26 DIAGNOSIS — S40012A Contusion of left shoulder, initial encounter: Secondary | ICD-10-CM | POA: Diagnosis not present

## 2023-03-26 DIAGNOSIS — Z7901 Long term (current) use of anticoagulants: Secondary | ICD-10-CM | POA: Insufficient documentation

## 2023-03-26 DIAGNOSIS — S20219A Contusion of unspecified front wall of thorax, initial encounter: Secondary | ICD-10-CM

## 2023-03-26 DIAGNOSIS — I10 Essential (primary) hypertension: Secondary | ICD-10-CM | POA: Insufficient documentation

## 2023-03-26 DIAGNOSIS — Z79899 Other long term (current) drug therapy: Secondary | ICD-10-CM | POA: Diagnosis not present

## 2023-03-26 DIAGNOSIS — R072 Precordial pain: Secondary | ICD-10-CM | POA: Diagnosis present

## 2023-03-26 DIAGNOSIS — Y92410 Unspecified street and highway as the place of occurrence of the external cause: Secondary | ICD-10-CM | POA: Diagnosis not present

## 2023-03-26 DIAGNOSIS — R079 Chest pain, unspecified: Secondary | ICD-10-CM | POA: Diagnosis not present

## 2023-03-26 MED ORDER — ACETAMINOPHEN 500 MG PO TABS
1000.0000 mg | ORAL_TABLET | Freq: Once | ORAL | Status: AC
  Administered 2023-03-26: 1000 mg via ORAL
  Filled 2023-03-26: qty 2

## 2023-03-26 MED ORDER — METHOCARBAMOL 500 MG PO TABS
500.0000 mg | ORAL_TABLET | Freq: Two times a day (BID) | ORAL | 0 refills | Status: DC
  Filled 2023-03-26: qty 20, 10d supply, fill #0

## 2023-03-26 NOTE — Discharge Instructions (Signed)
X-ray looks normal but you are going to be very sore over the next few days.  Tylenol as needed for pain, heating pad, muscle rubs even something like Voltaren gel may be helpful.  Use the muscle relaxer as needed.
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
## 2023-04-25 DIAGNOSIS — H524 Presbyopia: Secondary | ICD-10-CM | POA: Diagnosis not present

## 2023-05-21 ENCOUNTER — Other Ambulatory Visit (HOSPITAL_BASED_OUTPATIENT_CLINIC_OR_DEPARTMENT_OTHER): Payer: Self-pay

## 2023-05-21 ENCOUNTER — Ambulatory Visit: Payer: Commercial Managed Care - PPO | Admitting: Family

## 2023-05-21 ENCOUNTER — Encounter: Payer: Self-pay | Admitting: Family

## 2023-05-21 VITALS — BP 130/82 | HR 61 | Temp 97.6°F | Ht 64.0 in | Wt 189.2 lb

## 2023-05-21 DIAGNOSIS — R5383 Other fatigue: Secondary | ICD-10-CM

## 2023-05-21 DIAGNOSIS — R7309 Other abnormal glucose: Secondary | ICD-10-CM | POA: Diagnosis not present

## 2023-05-21 DIAGNOSIS — Z13220 Encounter for screening for lipoid disorders: Secondary | ICD-10-CM

## 2023-05-21 DIAGNOSIS — R7989 Other specified abnormal findings of blood chemistry: Secondary | ICD-10-CM

## 2023-05-21 DIAGNOSIS — M79642 Pain in left hand: Secondary | ICD-10-CM | POA: Diagnosis not present

## 2023-05-21 DIAGNOSIS — M79641 Pain in right hand: Secondary | ICD-10-CM

## 2023-05-21 DIAGNOSIS — I10 Essential (primary) hypertension: Secondary | ICD-10-CM

## 2023-05-21 DIAGNOSIS — Z1231 Encounter for screening mammogram for malignant neoplasm of breast: Secondary | ICD-10-CM

## 2023-05-21 DIAGNOSIS — Z1211 Encounter for screening for malignant neoplasm of colon: Secondary | ICD-10-CM

## 2023-05-21 DIAGNOSIS — D693 Immune thrombocytopenic purpura: Secondary | ICD-10-CM | POA: Diagnosis not present

## 2023-05-21 MED ORDER — VALSARTAN-HYDROCHLOROTHIAZIDE 320-25 MG PO TABS
1.0000 | ORAL_TABLET | Freq: Every day | ORAL | 3 refills | Status: DC
  Filled 2023-05-21: qty 90, 90d supply, fill #0
  Filled 2023-10-22: qty 90, 90d supply, fill #1

## 2023-05-21 MED ORDER — EPINEPHRINE 0.3 MG/0.3ML IJ SOAJ
0.3000 mg | INTRAMUSCULAR | 2 refills | Status: AC | PRN
  Filled 2023-05-21: qty 2, 1d supply, fill #0

## 2023-05-21 MED ORDER — NEBIVOLOL HCL 5 MG PO TABS
5.0000 mg | ORAL_TABLET | Freq: Every day | ORAL | 3 refills | Status: DC
  Filled 2023-05-21: qty 30, 30d supply, fill #0
  Filled 2023-05-21: qty 60, 60d supply, fill #0
  Filled 2023-05-21: qty 90, 90d supply, fill #0

## 2023-05-21 NOTE — Patient Instructions (Signed)
Ask Dr. Myna Hidalgo about you getting the Tdap and Shingrix vaccines;   Cologuard has been ordered- should come to your house;

## 2023-05-21 NOTE — Progress Notes (Signed)
Janice Martin is a 62 y.o. female with the following history as recorded in EpicCare:  Patient Active Problem List   Diagnosis Date Noted   Hyperglycemia 10/18/2015   Chronic deep vein thrombosis (DVT) of distal vein of lower extremity (HCC) 04/04/2015   Saddle embolus of pulmonary artery (HCC) 03/24/2015   IMMUNE THROMBOCYTOPENIC PURPURA 05/10/2010   Essential hypertension 05/10/2010    Current Outpatient Medications  Medication Sig Dispense Refill   EPINEPHrine 0.3 mg/0.3 mL IJ SOAJ injection Inject 0.3 mg into the muscle as needed for anaphylaxis. 2 each 2   nebivolol (BYSTOLIC) 5 MG tablet Take 1 tablet (5 mg total) by mouth daily. 90 tablet 3   rivaroxaban (XARELTO) 10 MG TABS tablet TAKE 1 TABLET BY MOUTH ONCE DAILY WITH SUPPER 90 tablet 3   valsartan-hydrochlorothiazide (DIOVAN-HCT) 320-25 MG tablet TAKE 1 TABLET BY MOUTH DAILY. 90 tablet 3   No current facility-administered medications for this visit.    Allergies: Bee venom, Influenza vaccines, Morphine, and Shellfish allergy  Past Medical History:  Diagnosis Date   ANEMIA-IRON DEFICIENCY    Anxiety state, unspecified    DVT of leg (deep venous thrombosis) (HCC) 04/04/2015   HYPERTENSION    Immune thrombocytopenic purpura (HCC) 2003 dx   s/p rituxan 06/2012 - now remission   LEUKOCYTOSIS UNSPECIFIED    Pulmonary embolism (HCC) June 2016   chronic anticoag due to high risk recurrence    Past Surgical History:  Procedure Laterality Date   CESAREAN SECTION     x's 2   CHOLECYSTECTOMY     Spleenectomy  1985 or 1986   due to ITP    Family History  Problem Relation Age of Onset   Arthritis Father    Mental illness Father        severe depression   Arthritis Mother    Diabetes Mother    Hypertension Other        Parent   Heart disease Other        Grandparent   Stroke Other        grandparent   Multiple sclerosis Sister     Social History   Tobacco Use   Smoking status: Never   Smokeless tobacco: Never    Tobacco comments:    never used product  Substance Use Topics   Alcohol use: No    Alcohol/week: 0.0 standard drinks of alcohol    Subjective:   Presents today as a new patient to transfer care- has been going to office in [place] but works in our building; needs to get chronic care needs updated;  Has been having increased pain/ swelling in both hands; work involves extended time on computer/ pain more noticeable in the evenings; no nighttime numbness/ tingling but has taken B12 supplement in the past;   Objective:  Vitals:   05/21/23 1448  BP: 130/82  Pulse: 61  Temp: 97.6 F (36.4 C)  TempSrc: Oral  SpO2: 100%  Weight: 189 lb 3.2 oz (85.8 kg)  Height: 5\' 4"  (1.626 m)    General: Well developed, well nourished, in no acute distress  Skin : Warm and dry.  Head: Normocephalic and atraumatic  Eyes: Sclera and conjunctiva clear; pupils round and reactive to light; extraocular movements intact  Ears: External normal; canals clear; tympanic membranes normal  Oropharynx: Pink, supple. No suspicious lesions  Neck: Supple without thyromegaly, adenopathy  Lungs: Respirations unlabored; clear to auscultation bilaterally without wheeze, rales, rhonchi  CVS exam: normal rate and regular rhythm.  Musculoskeletal: No deformities; swelling noted in both hands- right greater than left Extremities: No edema, cyanosis, clubbing  Vessels: Symmetric bilaterally  Neurologic: Alert and oriented; speech intact; face symmetrical; moves all extremities well; CNII-XII intact without focal deficit   Assessment:  1. Elevated glucose   2. Lipid screening   3. Low vitamin B12 level   4. Other fatigue   5. Colon cancer screening   6. Visit for screening mammogram   7. Bilateral hand pain   8. Essential hypertension   9. IMMUNE THROMBOCYTOPENIC PURPURA     Plan:  Check Hgba1c today; Check lipid panel; Check B12 level;  Check TSH today; Order updated for Cologuard; Order updated for  screening mammogram; Check ANA, sed rate, RF; Will need to consider referral to orthopedics- will re-evaluate at next OV after she has been back on blood pressure medication consistently; Under care of hematology/ oncology; currently on Xarelto;   Return in about 1 month (around 06/21/2023) for CPE.  Orders Placed This Encounter  Procedures   MM Digital Screening    Standing Status:   Future    Standing Expiration Date:   05/20/2024    Order Specific Question:   Reason for Exam (SYMPTOM  OR DIAGNOSIS REQUIRED)    Answer:   screening mammogram    Order Specific Question:   Is the patient pregnant?    Answer:   No    Order Specific Question:   Preferred imaging location?    Answer:   [facility]   Comp Met (CMET)   Hemoglobin A1c   Lipid panel   B12   TSH   Cologuard   Antinuclear Antib (ANA)   Sedimentation rate   Rheumatoid Factor    Requested Prescriptions   Signed Prescriptions Disp Refills   nebivolol (BYSTOLIC) 5 MG tablet 90 tablet 3    Sig: Take 1 tablet (5 mg total) by mouth daily.   valsartan-hydrochlorothiazide (DIOVAN-HCT) 320-25 MG tablet 90 tablet 3    Sig: TAKE 1 TABLET BY MOUTH DAILY.   EPINEPHrine 0.3 mg/0.3 mL IJ SOAJ injection 2 each 2    Sig: Inject 0.3 mg into the muscle as needed for anaphylaxis.

## 2023-06-02 ENCOUNTER — Encounter (HOSPITAL_BASED_OUTPATIENT_CLINIC_OR_DEPARTMENT_OTHER): Payer: Self-pay

## 2023-06-02 ENCOUNTER — Ambulatory Visit (HOSPITAL_BASED_OUTPATIENT_CLINIC_OR_DEPARTMENT_OTHER)
Admission: RE | Admit: 2023-06-02 | Discharge: 2023-06-02 | Disposition: A | Discharge status: Home / Self Care | Payer: Commercial Managed Care - PPO | Source: Ambulatory Visit | Attending: Family | Admitting: Family

## 2023-06-02 DIAGNOSIS — Z1231 Encounter for screening mammogram for malignant neoplasm of breast: Secondary | ICD-10-CM | POA: Insufficient documentation

## 2023-06-04 ENCOUNTER — Encounter (INDEPENDENT_AMBULATORY_CARE_PROVIDER_SITE_OTHER): Payer: Self-pay

## 2023-06-12 DIAGNOSIS — Z1211 Encounter for screening for malignant neoplasm of colon: Secondary | ICD-10-CM | POA: Diagnosis not present

## 2023-06-23 ENCOUNTER — Ambulatory Visit: Payer: Commercial Managed Care - PPO | Admitting: Family

## 2023-06-23 ENCOUNTER — Other Ambulatory Visit (HOSPITAL_BASED_OUTPATIENT_CLINIC_OR_DEPARTMENT_OTHER): Payer: Self-pay

## 2023-06-23 ENCOUNTER — Encounter: Payer: Self-pay | Admitting: Family

## 2023-06-23 VITALS — BP 150/90 | HR 74 | Resp 18 | Ht 64.0 in | Wt 187.4 lb

## 2023-06-23 DIAGNOSIS — Z0000 Encounter for general adult medical examination without abnormal findings: Secondary | ICD-10-CM | POA: Diagnosis not present

## 2023-06-23 MED ORDER — NEBIVOLOL HCL 10 MG PO TABS
10.0000 mg | ORAL_TABLET | Freq: Every day | ORAL | 3 refills | Status: DC
  Filled 2023-06-23: qty 90, 90d supply, fill #0
  Filled 2023-10-22: qty 90, 90d supply, fill #1

## 2023-06-23 NOTE — Patient Instructions (Signed)
Start checking your blood pressure in the next week and send me some readings so I know how you are doing;  We will plan to see you in 6 months;

## 2023-06-23 NOTE — Progress Notes (Signed)
Janice Martin is a 62 y.o. female with the following history as recorded in EpicCare:  Patient Active Problem List   Diagnosis Date Noted   Hyperglycemia 10/18/2015   Chronic deep vein thrombosis (DVT) of distal vein of lower extremity (HCC) 04/04/2015   Saddle embolus of pulmonary artery (HCC) 03/24/2015   IMMUNE THROMBOCYTOPENIC PURPURA 05/10/2010   Essential hypertension 05/10/2010    Current Outpatient Medications  Medication Sig Dispense Refill   EPINEPHrine 0.3 mg/0.3 mL IJ SOAJ injection Inject 0.3 mg into the muscle as needed for anaphylaxis. 2 each 2   valsartan-hydrochlorothiazide (DIOVAN-HCT) 320-25 MG tablet TAKE 1 TABLET BY MOUTH DAILY. 90 tablet 3   nebivolol (BYSTOLIC) 10 MG tablet Take 1 tablet (10 mg total) by mouth daily. 90 tablet 3   rivaroxaban (XARELTO) 10 MG TABS tablet TAKE 1 TABLET BY MOUTH ONCE DAILY WITH SUPPER 90 tablet 3   No current facility-administered medications for this visit.    Allergies: Bee venom, Influenza vaccines, Morphine, and Shellfish allergy  Past Medical History:  Diagnosis Date   ANEMIA-IRON DEFICIENCY    Anxiety state, unspecified    DVT of leg (deep venous thrombosis) (HCC) 04/04/2015   HYPERTENSION    Immune thrombocytopenic purpura (HCC) 2003 dx   s/p rituxan 06/2012 - now remission   LEUKOCYTOSIS UNSPECIFIED    Pulmonary embolism (HCC) June 2016   chronic anticoag due to high risk recurrence    Past Surgical History:  Procedure Laterality Date   CESAREAN SECTION     x's 2   CHOLECYSTECTOMY     Spleenectomy  1985 or 1986   due to ITP    Family History  Problem Relation Age of Onset   Arthritis Father    Mental illness Father        severe depression   Arthritis Mother    Diabetes Mother    Hypertension Other        Parent   Heart disease Other        Grandparent   Stroke Other        grandparent   Multiple sclerosis Sister     Social History   Tobacco Use   Smoking status: Never   Smokeless tobacco: Never    Tobacco comments:    never used product  Substance Use Topics   Alcohol use: No    Alcohol/week: 0.0 standard drinks of alcohol    Subjective:   Presents for 1 month follow up/ had planned to complete as CPE; does need to get pap smear updated today but patient defers today; does note that hands have been feeling better since getting back on her blood pressure medication;  Cologuard and mammogram updated;  Notes that today was very bad at work and feels this is why her blood pressure is elevated;   Review of Systems  Constitutional: Negative.   HENT: Negative.    Eyes: Negative.   Respiratory: Negative.    Cardiovascular: Negative.   Gastrointestinal: Negative.   Genitourinary: Negative.   Musculoskeletal: Negative.   Skin: Negative.   Neurological: Negative.   Endo/Heme/Allergies: Negative.   Psychiatric/Behavioral: Negative.       Objective:  Vitals:   06/23/23 1547 06/23/23 1630  BP: (!) 156/100 (!) 150/90  Pulse: 74   Resp: 18   SpO2: 97%   Weight: 187 lb 6.4 oz (85 kg)   Height: 5\' 4"  (1.626 m)     General: Well developed, well nourished, in no acute distress  Skin : Warm  and dry.  Head: Normocephalic and atraumatic  Eyes: Sclera and conjunctiva clear; pupils round and reactive to light; extraocular movements intact  Ears: External normal; canals clear; tympanic membranes normal  Oropharynx: Pink, supple. No suspicious lesions  Neck: Supple without thyromegaly, adenopathy  Lungs: Respirations unlabored; clear to auscultation bilaterally without wheeze, rales, rhonchi  CVS exam: normal rate and regular rhythm.  Abdomen: Soft; nontender; nondistended; normoactive bowel sounds; no masses or hepatosplenomegaly  Musculoskeletal: No deformities; no active joint inflammation  Extremities: No edema, cyanosis, clubbing  Vessels: Symmetric bilaterally  Neurologic: Alert and oriented; speech intact; face symmetrical; moves all extremities well; CNII-XII intact without  focal deficit   Assessment:  1. PE (physical exam), annual     Plan:  Age appropriate preventive healthcare needs addressed; encouraged regular eye doctor and dental exams; encouraged regular exercise; reviewed labs that were done at last OV;  Will increase Bystolic to 10 mg daily/ continue Diovan HCT; she will start checking her blood pressure regularly and send readings for review in the next 2 weeks;  Follow up in 6 months;   Return in about 6 months (around 12/21/2023).  No orders of the defined types were placed in this encounter.   Requested Prescriptions   Signed Prescriptions Disp Refills   nebivolol (BYSTOLIC) 10 MG tablet 90 tablet 3    Sig: Take 1 tablet (10 mg total) by mouth daily.

## 2023-12-22 ENCOUNTER — Ambulatory Visit: Payer: Commercial Managed Care - PPO | Admitting: Family

## 2023-12-22 VITALS — BP 148/90 | HR 86 | Ht 64.0 in | Wt 185.0 lb

## 2023-12-22 DIAGNOSIS — I10 Essential (primary) hypertension: Secondary | ICD-10-CM | POA: Diagnosis not present

## 2023-12-22 NOTE — Progress Notes (Signed)
 Janice Martin is a 63 y.o. female with the following history as recorded in Epic[facility]:  Patient Active Problem List   Diagnosis Date Noted   Hyperglycemia 10/18/2015   Chronic deep vein thrombosis (DVT) of distal vein of lower extremity (HCC) 04/04/2015   Saddle embolus of pulmonary artery (HCC) 03/24/2015   IMMUNE THROMBOCYTOPENIC PURPURA 05/10/2010   Essential hypertension 05/10/2010    Current Outpatient Medications  Medication Sig Dispense Refill   nebivolol (BYSTOLIC) 10 MG tablet Take 1 tablet (10 mg total) by mouth daily. 90 tablet 3   rivaroxaban (XARELTO) 10 MG TABS tablet TAKE 1 TABLET BY MOUTH ONCE DAILY WITH SUPPER 90 tablet 3   valsartan-hydrochlorothiazide (DIOVAN-HCT) 320-25 MG tablet TAKE 1 TABLET BY MOUTH DAILY. 90 tablet 3   EPINEPHrine 0.3 mg/0.3 mL IJ SOAJ injection Inject 0.3 mg into the muscle as needed for anaphylaxis. (Patient not taking: Reported on 12/22/2023) 2 each 2   No current facility-administered medications for this visit.    Allergies: Bee venom, Influenza vaccines, Morphine, and Shellfish allergy  Past Medical History:  Diagnosis Date   ANEMIA-IRON DEFICIENCY    Anxiety state, unspecified    DVT of leg (deep venous thrombosis) (HCC) 04/04/2015   HYPERTENSION    Immune thrombocytopenic purpura (HCC) 2003 dx   s/p rituxan 06/2012 - now remission   LEUKOCYTOSIS UNSPECIFIED    Pulmonary embolism (HCC) June 2016   chronic anticoag due to high risk recurrence    Past Surgical History:  Procedure Laterality Date   CESAREAN SECTION     x's 2   CHOLECYSTECTOMY     Spleenectomy  1985 or 1986   due to ITP    Family History  Problem Relation Age of Onset   Arthritis Father    Mental illness Father        severe depression   Arthritis Mother    Diabetes Mother    Hypertension Other        Parent   Heart disease Other        Grandparent   Stroke Other        grandparent   Multiple sclerosis Sister     Social History   Tobacco Use   Smoking  status: Never   Smokeless tobacco: Never   Tobacco comments:    never used product  Substance Use Topics   Alcohol use: No    Alcohol/week: 0.0 standard drinks of alcohol    Subjective:  6 month follow up on hypertension; at last OV, Bystolic was increased to 10 mg; patient has been feeling very good- Denies any chest pain, shortness of breath, blurred vision or headache; admits that stress level has been very high- both her mother and sister are in a nursing home and do currently have COVID;     Objective:  Vitals:   12/22/23 1503 12/22/23 1527  BP: (!) 156/96 (!) 148/90  Pulse: 86   SpO2: 96%   Weight: 185 lb (83.9 kg)   Height: 5\' 4"  (1.626 m)     General: Well developed, well nourished, in no acute distress  Skin : Warm and dry.  Head: Normocephalic and atraumatic  Lungs: Respirations unlabored; clear to auscultation bilaterally without wheeze, rales, rhonchi  CVS exam: normal rate and regular rhythm.  Neurologic: Alert and oriented; speech intact; face symmetrical; moves all extremities well; CNII-XII intact without focal deficit   Assessment:  1. Essential hypertension     Plan:  Uncontrolled; increase Bystolic to 20 mg; continue Valsartan  320/25 daily; she is to start checking and recording her numbers and bring log to next appointment; to consider adding 4th agent; follow up in 1 month.   No follow-ups on file.  No orders of the defined types were placed in this encounter.   Requested Prescriptions    No prescriptions requested or ordered in this encounter

## 2023-12-22 NOTE — Patient Instructions (Signed)
 Increase the Bystolic to 20 mg daily ( 2 tablets at the same time); continue the Valsartan HCT as prescribed;  Start checking your blood pressure regularly and bring readings to the next OV;

## 2023-12-25 ENCOUNTER — Inpatient Hospital Stay: Payer: Commercial Managed Care - PPO | Attending: Hematology & Oncology

## 2023-12-25 ENCOUNTER — Other Ambulatory Visit: Payer: Self-pay

## 2023-12-25 ENCOUNTER — Encounter: Payer: Self-pay | Admitting: Hematology & Oncology

## 2023-12-25 ENCOUNTER — Inpatient Hospital Stay: Payer: Commercial Managed Care - PPO | Admitting: Hematology & Oncology

## 2023-12-25 VITALS — BP 150/61 | HR 79 | Temp 98.0°F | Resp 16 | Ht 64.0 in | Wt 186.0 lb

## 2023-12-25 DIAGNOSIS — Z79899 Other long term (current) drug therapy: Secondary | ICD-10-CM | POA: Diagnosis not present

## 2023-12-25 DIAGNOSIS — Z86711 Personal history of pulmonary embolism: Secondary | ICD-10-CM | POA: Insufficient documentation

## 2023-12-25 DIAGNOSIS — D693 Immune thrombocytopenic purpura: Secondary | ICD-10-CM | POA: Insufficient documentation

## 2023-12-25 DIAGNOSIS — Z7901 Long term (current) use of anticoagulants: Secondary | ICD-10-CM | POA: Diagnosis not present

## 2023-12-25 DIAGNOSIS — I825Z1 Chronic embolism and thrombosis of unspecified deep veins of right distal lower extremity: Secondary | ICD-10-CM

## 2023-12-25 LAB — CBC WITH DIFFERENTIAL (CANCER CENTER ONLY)
Abs Immature Granulocytes: 0.03 10*3/uL (ref 0.00–0.07)
Basophils Absolute: 0.2 10*3/uL — ABNORMAL HIGH (ref 0.0–0.1)
Basophils Relative: 1 %
Eosinophils Absolute: 0 10*3/uL (ref 0.0–0.5)
Eosinophils Relative: 0 %
HCT: 41 % (ref 36.0–46.0)
Hemoglobin: 14.1 g/dL (ref 12.0–15.0)
Immature Granulocytes: 0 %
Lymphocytes Relative: 27 %
Lymphs Abs: 3.2 10*3/uL (ref 0.7–4.0)
MCH: 32.3 pg (ref 26.0–34.0)
MCHC: 34.4 g/dL (ref 30.0–36.0)
MCV: 94 fL (ref 80.0–100.0)
Monocytes Absolute: 1 10*3/uL (ref 0.1–1.0)
Monocytes Relative: 9 %
Neutro Abs: 7.2 10*3/uL (ref 1.7–7.7)
Neutrophils Relative %: 63 %
Platelet Count: 332 10*3/uL (ref 150–400)
RBC: 4.36 MIL/uL (ref 3.87–5.11)
RDW: 13.2 % (ref 11.5–15.5)
WBC Count: 11.6 10*3/uL — ABNORMAL HIGH (ref 4.0–10.5)
nRBC: 0 % (ref 0.0–0.2)

## 2023-12-25 LAB — CMP (CANCER CENTER ONLY)
ALT: 12 U/L (ref 0–44)
AST: 10 U/L — ABNORMAL LOW (ref 15–41)
Albumin: 4.5 g/dL (ref 3.5–5.0)
Alkaline Phosphatase: 97 U/L (ref 38–126)
Anion gap: 9 (ref 5–15)
BUN: 21 mg/dL (ref 8–23)
CO2: 30 mmol/L (ref 22–32)
Calcium: 9.8 mg/dL (ref 8.9–10.3)
Chloride: 102 mmol/L (ref 98–111)
Creatinine: 1.22 mg/dL — ABNORMAL HIGH (ref 0.44–1.00)
GFR, Estimated: 50 mL/min — ABNORMAL LOW (ref >60)
Glucose, Bld: 116 mg/dL — ABNORMAL HIGH (ref 70–99)
Potassium: 4.8 mmol/L (ref 3.5–5.1)
Sodium: 141 mmol/L (ref 135–145)
Total Bilirubin: 0.3 mg/dL (ref 0.0–1.2)
Total Protein: 7.9 g/dL (ref 6.5–8.1)

## 2023-12-25 LAB — SAVE SMEAR(SSMR), FOR PROVIDER SLIDE REVIEW

## 2023-12-25 NOTE — Progress Notes (Signed)
 Hematology and Oncology Follow Up Visit  Janice Martin [id number] [birth year] 63 y.o. 12/25/2023   Principle Diagnosis:  Chronic immune thrombocytopenia-recurrent Submassive pulmonary embolism, with right lower extremity thrombus  Current Therapy:   Xarelto 10 mg by mouth daily  Decadron-taper per protocol for relapsed thrombocytopenia Nplate q 3 weeks for platelet count < 200K -- change on 05/23/2020     Interim History:  Janice Martin is back for followup.  We now see her every year.  As always, we see her every day.  She seems to be doing quite well.  Unfortunately, there was a death in the family that she has to have a funeral that she is to go to tomorrow.  She continues to do well on the Xarelto.  There has been no bleeding.  She has had no problems with nausea or vomiting.  There is been no problems with the COVID.  Her mom and sister have COVID right now.  There is been no problems with bowels or bladder.  She has had no rashes.  She has had no cough or shortness of breath.  Overall, I would say that her performance status is ECOG 0.      Medications:  Current Outpatient Medications:    EPINEPHrine 0.3 mg/0.3 mL IJ SOAJ injection, Inject 0.3 mg into the muscle as needed for anaphylaxis. (Patient not taking: Reported on 12/22/2023), Disp: 2 each, Rfl: 2   nebivolol (BYSTOLIC) 10 MG tablet, Take 1 tablet (10 mg total) by mouth daily., Disp: 90 tablet, Rfl: 3   rivaroxaban (XARELTO) 10 MG TABS tablet, TAKE 1 TABLET BY MOUTH ONCE DAILY WITH SUPPER, Disp: 90 tablet, Rfl: 3   valsartan-hydrochlorothiazide (DIOVAN-HCT) 320-25 MG tablet, TAKE 1 TABLET BY MOUTH DAILY., Disp: 90 tablet, Rfl: 3  Allergies:  Allergies  Allergen Reactions   Bee Venom Anaphylaxis, Shortness Of Breath and Swelling   Influenza Vaccines Other (See Comments)    Per pt can't take mess up with her immune system   Morphine Other (See Comments)    Burns really bad   Shellfish Allergy Anaphylaxis    Past Medical  History, Surgical history, Social history, and Family History were reviewed and updated.  Review of Systems: Review of Systems  Constitutional: Negative.   HENT: Negative.    Eyes: Negative.   Respiratory: Negative.    Cardiovascular: Negative.   Gastrointestinal: Negative.   Genitourinary: Negative.   Musculoskeletal: Negative.   Skin: Negative.   Neurological: Negative.   Endo/Heme/Allergies: Negative.   Psychiatric/Behavioral: Negative.      Physical Exam:  Temperature 98.  Pulse 79.  Blood pressure 150/61.  Weight is 186 pounds.  Physical Exam Vitals reviewed.  HENT:     Head: Normocephalic and atraumatic.  Eyes:     Pupils: Pupils are equal, round, and reactive to light.  Cardiovascular:     Rate and Rhythm: Normal rate and regular rhythm.     Heart sounds: Normal heart sounds.  Pulmonary:     Effort: Pulmonary effort is normal.     Breath sounds: Normal breath sounds.  Abdominal:     General: Bowel sounds are normal.     Palpations: Abdomen is soft.     Comments: She has well-healed splenectomy scar.  No fluid wave.  No palpable liver.  No guarding or rebound tenderness.  Musculoskeletal:        General: No tenderness or deformity. Normal range of motion.     Cervical back: Normal range of motion.  Lymphadenopathy:     Cervical: No cervical adenopathy.  Skin:    General: Skin is warm and dry.     Findings: No erythema or rash.  Neurological:     Mental Status: She is alert and oriented to person, place, and time.  Psychiatric:        Behavior: Behavior normal.        Thought Content: Thought content normal.        Judgment: Judgment normal.     Lab Results  Component Value Date   WBC 10.6 (H) 12/26/2022   HGB 14.2 12/26/2022   HCT 42.1 12/26/2022   MCV 94.2 12/26/2022   PLT 230 12/26/2022     Chemistry      Component Value Date/Time   NA 138 05/21/2023 1537   NA 140 09/17/2017 1507   K 4.3 05/21/2023 1537   K 3.9 09/17/2017 1507   CL 98  05/21/2023 1537   CL 99 04/17/2017 1411   CO2 31 05/21/2023 1537   CO2 30 (H) 09/17/2017 1507   BUN 14 05/21/2023 1537   BUN 8.9 09/17/2017 1507   CREATININE 1.05 05/21/2023 1537   CREATININE 1.11 (H) 12/26/2022 0739   CREATININE 1.2 (H) 09/17/2017 1507      Component Value Date/Time   CALCIUM 9.6 05/21/2023 1537   CALCIUM 9.3 09/17/2017 1507   ALKPHOS 113 05/21/2023 1537   ALKPHOS 105 09/17/2017 1507   AST 11 05/21/2023 1537   AST 12 (L) 12/26/2022 0739   AST 15 09/17/2017 1507   ALT 12 05/21/2023 1537   ALT 13 12/26/2022 0739   ALT 18 09/17/2017 1507   BILITOT 0.5 05/21/2023 1537   BILITOT 0.5 12/26/2022 0739   BILITOT 0.38 09/17/2017 1507       Impression and Plan: Janice Martin is a 63 year old white female. She has a history of relapsed chronic immune thrombocytopenia.   I looked at her blood under the microscope.  She has some Howell-Jolly bodies with some of the red cells.  She has adequate platelets.  Platelets are somewhat large and well granulated.  We will plan to get her back in 1 year.  Obviously, if she has any problems, we can certainly see her sooner.  Apparently, if there are any issues, since she works with this, she will always let us know.  As always, she has an incredibly strong faith.  I  just hate the fact that she is a huge [employer].  Josph Macho, MD 3/7/20258:04 AM

## 2024-01-18 ENCOUNTER — Other Ambulatory Visit (HOSPITAL_BASED_OUTPATIENT_CLINIC_OR_DEPARTMENT_OTHER): Payer: Self-pay

## 2024-01-18 MED ORDER — BLOOD PRESSURE MONITOR/ARM DEVI
Freq: Every day | 0 refills | Status: DC
  Filled 2024-01-18: qty 1, 30d supply, fill #0

## 2024-01-22 ENCOUNTER — Ambulatory Visit: Admitting: Family

## 2024-01-29 ENCOUNTER — Ambulatory Visit: Admitting: Family

## 2024-02-16 ENCOUNTER — Ambulatory Visit: Admitting: Family

## 2024-02-16 ENCOUNTER — Other Ambulatory Visit (HOSPITAL_BASED_OUTPATIENT_CLINIC_OR_DEPARTMENT_OTHER): Payer: Self-pay

## 2024-02-16 VITALS — BP 136/86 | HR 71 | Temp 97.6°F | Resp 18 | Ht 64.0 in | Wt 186.2 lb

## 2024-02-16 DIAGNOSIS — I10 Essential (primary) hypertension: Secondary | ICD-10-CM

## 2024-02-16 MED ORDER — NEBIVOLOL HCL 20 MG PO TABS
20.0000 mg | ORAL_TABLET | Freq: Every day | ORAL | 3 refills | Status: DC
  Filled 2024-02-16: qty 90, 90d supply, fill #0

## 2024-02-16 NOTE — Progress Notes (Signed)
 Janice Martin is a 63 y.o. female with the following history as recorded in Epic[facility]:  Patient Active Problem List   Diagnosis Date Noted   Hyperglycemia 10/18/2015   Chronic deep vein thrombosis (DVT) of distal vein of lower extremity (HCC) 04/04/2015   Saddle embolus of pulmonary artery (HCC) 03/24/2015   IMMUNE THROMBOCYTOPENIC PURPURA 05/10/2010   Essential hypertension 05/10/2010    Current Outpatient Medications  Medication Sig Dispense Refill   Blood Pressure Monitoring (BLOOD PRESSURE MONITOR/ARM) DEVI Use as directed to check blood pressure. 1 each 0   EPINEPHrine  0.3 mg/0.3 mL IJ SOAJ injection Inject 0.3 mg into the muscle as needed for anaphylaxis. 2 each 2   valsartan -hydrochlorothiazide  (DIOVAN -HCT) 320-25 MG tablet TAKE 1 TABLET BY MOUTH DAILY. 90 tablet 3   Nebivolol  HCl 20 MG TABS Take 1 tablet (20 mg total) by mouth daily. 90 tablet 3   rivaroxaban  (XARELTO ) 10 MG TABS tablet TAKE 1 TABLET BY MOUTH ONCE DAILY WITH SUPPER 90 tablet 3   No current facility-administered medications for this visit.    Allergies: Bee venom, Influenza vaccines, Morphine, and Shellfish allergy  Past Medical History:  Diagnosis Date   ANEMIA-IRON DEFICIENCY    Anxiety state, unspecified    DVT of leg (deep venous thrombosis) (HCC) 04/04/2015   HYPERTENSION    Immune thrombocytopenic purpura (HCC) 2003 dx   s/p rituxan  06/2012 - now remission   LEUKOCYTOSIS UNSPECIFIED    Pulmonary embolism (HCC) June 2016   chronic anticoag due to high risk recurrence    Past Surgical History:  Procedure Laterality Date   CESAREAN SECTION     x's 2   CHOLECYSTECTOMY     Spleenectomy  1985 or 1986   due to ITP    Family History  Problem Relation Age of Onset   Arthritis Father    Mental illness Father        severe depression   Arthritis Mother    Diabetes Mother    Hypertension Other        Parent   Heart disease Other        Grandparent   Stroke Other        grandparent   Multiple  sclerosis Sister     Social History   Tobacco Use   Smoking status: Never   Smokeless tobacco: Never   Tobacco comments:    never used product  Substance Use Topics   Alcohol use: No    Alcohol/week: 0.0 standard drinks of alcohol    Subjective:   1 month follow up on hypertension; at last OV, Bystolic  was increased to 20 mg and Diovan  HCT was kept at same dosage; Denies any chest pain, shortness of breath, blurred vision or headache; notes that stress level has been extremely high due to 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  Head: Normocephalic and atraumatic  Eyes: Sclera and conjunctiva clear; pupils round and reactive to light; extraocular movements intact  Ears: External normal; canals clear; tympanic membranes normal  Oropharynx: Pink, supple. No suspicious lesions  Neck: Supple without thyromegaly, adenopathy  Lungs: Respirations unlabored; clear  to auscultation bilaterally without wheeze, rales, rhonchi  CVS exam: normal rate and regular rhythm.  Neurologic: Alert and oriented; speech intact; face symmetrical; moves all extremities well; CNII-XII intact without focal deficit   Assessment:  1. Essential hypertension     Plan:  Stable; continue current medications; prescription updated; follow up in 5 months, sooner prn.   Return in about 5 months (around 07/04/2024) for CPE.  No orders of the defined types were placed in this encounter.   Requested Prescriptions   Signed Prescriptions Disp Refills   Nebivolol  HCl 20 MG TABS 90 tablet 3    Sig: Take 1 tablet (20 mg total) by mouth daily.

## 2024-06-14 ENCOUNTER — Other Ambulatory Visit: Payer: Self-pay

## 2024-06-14 ENCOUNTER — Other Ambulatory Visit (HOSPITAL_BASED_OUTPATIENT_CLINIC_OR_DEPARTMENT_OTHER): Payer: Self-pay

## 2024-06-14 ENCOUNTER — Other Ambulatory Visit: Payer: Self-pay | Admitting: Family

## 2024-06-14 MED ORDER — VALSARTAN-HYDROCHLOROTHIAZIDE 320-25 MG PO TABS
1.0000 | ORAL_TABLET | Freq: Every day | ORAL | 0 refills | Status: DC
  Filled 2024-06-14: qty 90, 90d supply, fill #0

## 2024-07-08 ENCOUNTER — Encounter: Admitting: Family

## 2024-07-11 ENCOUNTER — Encounter: Admitting: Family Medicine

## 2024-09-13 ENCOUNTER — Ambulatory Visit: Admitting: Family Medicine

## 2024-09-13 ENCOUNTER — Encounter: Payer: Self-pay | Admitting: Family Medicine

## 2024-09-13 ENCOUNTER — Other Ambulatory Visit (HOSPITAL_BASED_OUTPATIENT_CLINIC_OR_DEPARTMENT_OTHER): Payer: Self-pay

## 2024-09-13 VITALS — BP 125/45 | HR 61 | Ht 64.0 in | Wt 198.0 lb

## 2024-09-13 DIAGNOSIS — I10 Essential (primary) hypertension: Secondary | ICD-10-CM | POA: Diagnosis not present

## 2024-09-13 DIAGNOSIS — D693 Immune thrombocytopenic purpura: Secondary | ICD-10-CM | POA: Diagnosis not present

## 2024-09-13 DIAGNOSIS — Z0000 Encounter for general adult medical examination without abnormal findings: Secondary | ICD-10-CM | POA: Diagnosis not present

## 2024-09-13 DIAGNOSIS — I825Z1 Chronic embolism and thrombosis of unspecified deep veins of right distal lower extremity: Secondary | ICD-10-CM

## 2024-09-13 MED ORDER — VALSARTAN-HYDROCHLOROTHIAZIDE 320-25 MG PO TABS
1.0000 | ORAL_TABLET | Freq: Every day | ORAL | 1 refills | Status: AC
Start: 2024-09-13 — End: ?
  Filled 2024-09-13: qty 90, 90d supply, fill #0

## 2024-09-13 MED ORDER — NEBIVOLOL HCL 10 MG PO TABS
10.0000 mg | ORAL_TABLET | Freq: Every day | ORAL | 3 refills | Status: AC
Start: 2024-09-13 — End: ?
  Filled 2024-09-13 (×2): qty 90, 90d supply, fill #0

## 2024-09-13 NOTE — Assessment & Plan Note (Signed)
 Hypertension management complicated by fatigue on increased Bystolic  dosage. Stable BP today.  - Reduced Bystolic  to 10 mg daily. - Monitor blood pressure and heart rate at home. - Scheduled nurse visit in 2-3 weeks for blood pressure recheck. - Continue valsartan /hydrochlorothiazide  as prescribed.

## 2024-09-13 NOTE — Assessment & Plan Note (Signed)
 Following with hematology (s/p splenectomy)

## 2024-09-13 NOTE — Assessment & Plan Note (Signed)
 Following with hematology.  On chronic Xarelto .  No complaints.

## 2024-09-13 NOTE — Progress Notes (Signed)
 New Patient Office Visit  Subjective    Patient ID: Janice Martin, female    DOB: [birth year]  Age: 63 y.o. MRN: [id number]  CC:  Chief Complaint  Patient presents with   Establish Care    HPI Janice Martin presents to establish/transfer care   Hypertension: - Medications: Nebivolol  HCL 20 mg daily and Valsartan -HCTZ 320-25 mg daily. - Compliance: good - Checking BP at home: rarely - Denies any SOB, recurrent headaches, CP, vision changes, LE edema, dizziness, palpitations, or medication side effects. She has felt a little sluggish since the increase in nebivolol  at last PCP visit.  - Diet: heart healthy  - Exercise: minimal    Immune Thrombocytopenic Purpura, history of DVT/PE (2016): - Followed by Dr. Maude Crease, Oncologist.  - Xarelto  10 mg daily.      Outpatient Encounter Medications as of 09/13/2024  Medication Sig   EPINEPHrine  0.3 mg/0.3 mL IJ SOAJ injection Inject 0.3 mg into the muscle as needed for anaphylaxis.   nebivolol  (BYSTOLIC ) 10 MG tablet Take 1 tablet (10 mg total) by mouth daily.   rivaroxaban  (XARELTO ) 10 MG TABS tablet TAKE 1 TABLET BY MOUTH ONCE DAILY WITH SUPPER   [DISCONTINUED] Nebivolol  HCl 20 MG TABS Take 1 tablet (20 mg total) by mouth daily.   [DISCONTINUED] valsartan -hydrochlorothiazide  (DIOVAN -HCT) 320-25 MG tablet TAKE 1 TABLET BY MOUTH DAILY.   valsartan -hydrochlorothiazide  (DIOVAN -HCT) 320-25 MG tablet TAKE 1 TABLET BY MOUTH DAILY.   [DISCONTINUED] Blood Pressure Monitoring (BLOOD PRESSURE MONITOR/ARM) DEVI Use as directed to check blood pressure.   No facility-administered encounter medications on file as of 09/13/2024.    Past Medical History:  Diagnosis Date   ANEMIA-IRON DEFICIENCY    Anxiety state, unspecified    DVT of leg (deep venous thrombosis) (HCC) 04/04/2015   HYPERTENSION    Immune thrombocytopenic purpura (HCC) 2003 dx   s/p rituxan  06/2012 - now remission   LEUKOCYTOSIS UNSPECIFIED    Pulmonary embolism (HCC)  June 2016   chronic anticoag due to high risk recurrence    Past Surgical History:  Procedure Laterality Date   CESAREAN SECTION  1986, 1989   x's 2   CHOLECYSTECTOMY     Spleenectomy  1985 or 1986   due to ITP    Family History  Problem Relation Age of Onset   Heart failure Mother    Arthritis Mother    Diabetes Mother    Dementia Mother    Arthritis Father    Mental illness Father        severe depression   Suicidality Father    Multiple sclerosis Sister    Arthritis Sister    Hypertension Other        Parent   Heart disease Other        Grandparent   Stroke Other        grandparent    Social History   Socioeconomic History   Marital status: Married    Spouse name: Not on file   Number of children: Not on file   Years of education: Not on file   Highest education level: Some college, no degree  Occupational History   Not on file  Tobacco Use   Smoking status: Never   Smokeless tobacco: Never   Tobacco comments:    never used product  Vaping Use   Vaping status: Never Used  Substance and Sexual Activity   Alcohol use: No    Alcohol/week: 0.0 standard drinks of alcohol   Drug  use: No   Sexual activity: Not on file  Other Topics Concern   Not on file  Social History Narrative   Not on file   Social Drivers of Health   Financial Resource Strain: Low Risk  (09/06/2024)   Overall Financial Resource Strain (CARDIA)    Difficulty of Paying Living Expenses: Not hard at all  Food Insecurity: No Food Insecurity (09/06/2024)   Hunger Vital Sign    Worried About Running Out of Food in the Last Year: Never true    Ran Out of Food in the Last Year: Never true  Transportation Needs: No Transportation Needs (09/06/2024)   PRAPARE - Administrator, Civil Service (Medical): No    Lack of Transportation (Non-Medical): No  Physical Activity: Inactive (09/06/2024)   Exercise Vital Sign    Days of Exercise per Week: 0 days    Minutes of Exercise per  Session: Not on file  Stress: Stress Concern Present (09/06/2024)   [employer] of Occupational Health - Occupational Stress Questionnaire    Feeling of Stress: To some extent  Social Connections: Socially Integrated (09/06/2024)   Social Connection and Isolation Panel    Frequency of Communication with Friends and Family: Twice a week    Frequency of Social Gatherings with Friends and Family: Once a week    Attends Religious Services: More than 4 times per year    Active Member of [employer] or Organizations: Yes    Attends Banker Meetings: More than 4 times per year    Marital Status: Married  Catering Manager Violence: Unknown (01/24/2022)   Received from [facility]   HITS    Physically Hurt: Not on file    Insult or Talk Down To: Not on file    Threaten Physical Harm: Not on file    Scream or Curse: Not on file    ROS All review of systems negative except what is listed in the HPI      Objective    BP (!) 125/45   Pulse 61   Ht 5' 4 (1.626 m)   Wt 198 lb (89.8 kg)   LMP 02/22/2015   SpO2 99%   BMI 33.99 kg/m   Physical Exam Vitals reviewed.  Constitutional:      Appearance: Normal appearance. She is obese.  Cardiovascular:     Rate and Rhythm: Normal rate and regular rhythm.  Pulmonary:     Effort: Pulmonary effort is normal.     Breath sounds: Normal breath sounds.  Skin:    General: Skin is warm and dry.  Neurological:     Mental Status: She is alert and oriented to person, place, and time.  Psychiatric:        Mood and Affect: Mood normal.        Behavior: Behavior normal.        Thought Content: Thought content normal.        Judgment: Judgment normal.            Assessment & Plan:   Problem List Items Addressed This Visit       Active Problems   Chronic deep vein thrombosis (DVT) of distal vein of lower extremity (HCC) (Chronic)   Following with hematology.  On chronic Xarelto .  No complaints.       Relevant  Medications   nebivolol  (BYSTOLIC ) 10 MG tablet   valsartan -hydrochlorothiazide  (DIOVAN -HCT) 320-25 MG tablet   IMMUNE THROMBOCYTOPENIC PURPURA   Following with hematology (s/p splenectomy)  Essential hypertension - Primary   Hypertension management complicated by fatigue on increased Bystolic  dosage. Stable BP today.  - Reduced Bystolic  to 10 mg daily. - Monitor blood pressure and heart rate at home. - Scheduled nurse visit in 2-3 weeks for blood pressure recheck. - Continue valsartan /hydrochlorothiazide  as prescribed.      Relevant Medications   nebivolol  (BYSTOLIC ) 10 MG tablet   valsartan -hydrochlorothiazide  (DIOVAN -HCT) 320-25 MG tablet   Other Relevant Orders   Comprehensive metabolic panel with GFR   Lipid panel   TSH   Other Visit Diagnoses       Encounter for medical examination to establish care              Return in about 2 weeks (around 09/27/2024) for BP check with nurse; CPE March 2026.   Waddell KATHEE Mon, NP

## 2024-09-26 ENCOUNTER — Other Ambulatory Visit: Payer: Self-pay | Admitting: Hematology & Oncology

## 2024-09-26 ENCOUNTER — Other Ambulatory Visit: Payer: Self-pay

## 2024-09-26 DIAGNOSIS — I2692 Saddle embolus of pulmonary artery without acute cor pulmonale: Secondary | ICD-10-CM

## 2024-09-26 DIAGNOSIS — I2602 Saddle embolus of pulmonary artery with acute cor pulmonale: Secondary | ICD-10-CM

## 2024-09-26 DIAGNOSIS — D693 Immune thrombocytopenic purpura: Secondary | ICD-10-CM

## 2024-09-26 DIAGNOSIS — I825Z2 Chronic embolism and thrombosis of unspecified deep veins of left distal lower extremity: Secondary | ICD-10-CM

## 2024-09-27 ENCOUNTER — Encounter: Payer: Self-pay | Admitting: Hematology & Oncology

## 2024-09-27 ENCOUNTER — Other Ambulatory Visit (HOSPITAL_BASED_OUTPATIENT_CLINIC_OR_DEPARTMENT_OTHER): Payer: Self-pay

## 2024-09-27 MED ORDER — RIVAROXABAN 10 MG PO TABS
10.0000 mg | ORAL_TABLET | Freq: Every day | ORAL | 3 refills | Status: AC
  Filled 2024-09-27 – 2024-10-18 (×3): qty 90, 90d supply, fill #0

## 2024-09-28 ENCOUNTER — Ambulatory Visit

## 2024-09-30 ENCOUNTER — Other Ambulatory Visit (HOSPITAL_BASED_OUTPATIENT_CLINIC_OR_DEPARTMENT_OTHER): Payer: Self-pay

## 2024-09-30 ENCOUNTER — Encounter: Payer: Self-pay | Admitting: Hematology & Oncology

## 2024-10-18 ENCOUNTER — Other Ambulatory Visit (HOSPITAL_BASED_OUTPATIENT_CLINIC_OR_DEPARTMENT_OTHER): Payer: Self-pay

## 2024-10-18 ENCOUNTER — Encounter: Payer: Self-pay | Admitting: Hematology & Oncology

## 2024-12-23 ENCOUNTER — Inpatient Hospital Stay

## 2024-12-23 ENCOUNTER — Ambulatory Visit: Admitting: Hematology & Oncology

## 2025-01-12 ENCOUNTER — Encounter: Admitting: Family Medicine
